# Patient Record
Sex: Female | Born: 1975 | Race: White | Hispanic: No | Smoking: Current every day smoker
Health system: Southern US, Community
[De-identification: ages and names within clinical notes are randomized; demographics above are authoritative.]

## PROBLEM LIST (undated history)

## (undated) DIAGNOSIS — M1990 Unspecified osteoarthritis, unspecified site: Secondary | ICD-10-CM

## (undated) DIAGNOSIS — J189 Pneumonia, unspecified organism: Secondary | ICD-10-CM

## (undated) DIAGNOSIS — R030 Elevated blood-pressure reading, without diagnosis of hypertension: Secondary | ICD-10-CM

## (undated) DIAGNOSIS — E039 Hypothyroidism, unspecified: Secondary | ICD-10-CM

## (undated) DIAGNOSIS — K5190 Ulcerative colitis, unspecified, without complications: Secondary | ICD-10-CM

## (undated) DIAGNOSIS — G43909 Migraine, unspecified, not intractable, without status migrainosus: Secondary | ICD-10-CM

## (undated) DIAGNOSIS — M549 Dorsalgia, unspecified: Secondary | ICD-10-CM

## (undated) DIAGNOSIS — F419 Anxiety disorder, unspecified: Secondary | ICD-10-CM

## (undated) DIAGNOSIS — L732 Hidradenitis suppurativa: Secondary | ICD-10-CM

## (undated) DIAGNOSIS — G8929 Other chronic pain: Secondary | ICD-10-CM

## (undated) DIAGNOSIS — Z87442 Personal history of urinary calculi: Secondary | ICD-10-CM

## (undated) DIAGNOSIS — F32A Depression, unspecified: Secondary | ICD-10-CM

## (undated) DIAGNOSIS — F329 Major depressive disorder, single episode, unspecified: Secondary | ICD-10-CM

## (undated) HISTORY — DX: Migraine, unspecified, not intractable, without status migrainosus: G43.909

## (undated) HISTORY — PX: KNEE SURGERY: SHX244

## (undated) HISTORY — PX: BREAST ENHANCEMENT SURGERY: SHX7

## (undated) HISTORY — DX: Hidradenitis suppurativa: L73.2

## (undated) HISTORY — PX: ANKLE SURGERY: SHX546

## (undated) HISTORY — PX: OVARIAN CYST REMOVAL: SHX89

## (undated) HISTORY — DX: Elevated blood-pressure reading, without diagnosis of hypertension: R03.0

---

## 1998-06-12 ENCOUNTER — Other Ambulatory Visit: Admission: RE | Admit: 1998-06-12 | Discharge: 1998-06-12 | Payer: Self-pay | Admitting: Obstetrics & Gynecology

## 1999-06-19 ENCOUNTER — Other Ambulatory Visit: Admission: RE | Admit: 1999-06-19 | Discharge: 1999-06-19 | Payer: Self-pay | Admitting: Obstetrics & Gynecology

## 2000-06-07 ENCOUNTER — Other Ambulatory Visit: Admission: RE | Admit: 2000-06-07 | Discharge: 2000-06-07 | Payer: Self-pay | Admitting: Obstetrics & Gynecology

## 2001-04-19 ENCOUNTER — Ambulatory Visit (HOSPITAL_COMMUNITY): Admission: RE | Admit: 2001-04-19 | Discharge: 2001-04-19 | Payer: Self-pay | Admitting: Family Medicine

## 2001-04-19 ENCOUNTER — Encounter: Payer: Self-pay | Admitting: Family Medicine

## 2001-06-09 ENCOUNTER — Other Ambulatory Visit: Admission: RE | Admit: 2001-06-09 | Discharge: 2001-06-09 | Payer: Self-pay | Admitting: Obstetrics & Gynecology

## 2001-07-05 ENCOUNTER — Ambulatory Visit (HOSPITAL_COMMUNITY): Admission: RE | Admit: 2001-07-05 | Discharge: 2001-07-05 | Payer: Self-pay | Admitting: Family Medicine

## 2001-07-05 ENCOUNTER — Encounter: Payer: Self-pay | Admitting: Family Medicine

## 2001-07-08 ENCOUNTER — Ambulatory Visit (HOSPITAL_COMMUNITY): Admission: RE | Admit: 2001-07-08 | Discharge: 2001-07-08 | Payer: Self-pay | Admitting: Family Medicine

## 2001-07-08 ENCOUNTER — Encounter: Payer: Self-pay | Admitting: Family Medicine

## 2002-10-03 ENCOUNTER — Encounter: Payer: Self-pay | Admitting: *Deleted

## 2002-10-03 ENCOUNTER — Emergency Department (HOSPITAL_COMMUNITY): Admission: EM | Admit: 2002-10-03 | Discharge: 2002-10-03 | Payer: Self-pay | Admitting: *Deleted

## 2003-03-19 ENCOUNTER — Emergency Department (HOSPITAL_COMMUNITY): Admission: EM | Admit: 2003-03-19 | Discharge: 2003-03-19 | Payer: Self-pay | Admitting: Emergency Medicine

## 2003-09-23 ENCOUNTER — Emergency Department (HOSPITAL_COMMUNITY): Admission: EM | Admit: 2003-09-23 | Discharge: 2003-09-23 | Payer: Self-pay | Admitting: Emergency Medicine

## 2003-09-28 ENCOUNTER — Ambulatory Visit (HOSPITAL_COMMUNITY): Admission: RE | Admit: 2003-09-28 | Discharge: 2003-09-28 | Payer: Self-pay | Admitting: Orthopedic Surgery

## 2004-01-07 ENCOUNTER — Ambulatory Visit (HOSPITAL_COMMUNITY): Admission: RE | Admit: 2004-01-07 | Discharge: 2004-01-07 | Payer: Self-pay | Admitting: *Deleted

## 2004-02-11 ENCOUNTER — Ambulatory Visit (HOSPITAL_COMMUNITY): Admission: RE | Admit: 2004-02-11 | Discharge: 2004-02-11 | Payer: Self-pay | Admitting: *Deleted

## 2004-04-06 ENCOUNTER — Ambulatory Visit (HOSPITAL_COMMUNITY): Admission: AD | Admit: 2004-04-06 | Discharge: 2004-04-06 | Payer: Self-pay | Admitting: *Deleted

## 2004-04-25 ENCOUNTER — Ambulatory Visit (HOSPITAL_COMMUNITY): Admission: RE | Admit: 2004-04-25 | Discharge: 2004-04-25 | Payer: Self-pay | Admitting: *Deleted

## 2004-05-01 ENCOUNTER — Ambulatory Visit (HOSPITAL_COMMUNITY): Admission: RE | Admit: 2004-05-01 | Discharge: 2004-05-01 | Payer: Self-pay | Admitting: *Deleted

## 2004-05-19 ENCOUNTER — Inpatient Hospital Stay (HOSPITAL_COMMUNITY): Admission: AD | Admit: 2004-05-19 | Discharge: 2004-05-22 | Payer: Self-pay | Admitting: *Deleted

## 2005-02-18 ENCOUNTER — Ambulatory Visit (HOSPITAL_COMMUNITY): Admission: RE | Admit: 2005-02-18 | Discharge: 2005-02-18 | Payer: Self-pay | Admitting: Family Medicine

## 2005-03-09 ENCOUNTER — Ambulatory Visit (HOSPITAL_COMMUNITY): Admission: RE | Admit: 2005-03-09 | Discharge: 2005-03-09 | Payer: Self-pay | Admitting: *Deleted

## 2005-08-30 ENCOUNTER — Emergency Department (HOSPITAL_COMMUNITY): Admission: EM | Admit: 2005-08-30 | Discharge: 2005-08-30 | Payer: Self-pay | Admitting: Emergency Medicine

## 2006-11-03 ENCOUNTER — Emergency Department (HOSPITAL_COMMUNITY): Admission: EM | Admit: 2006-11-03 | Discharge: 2006-11-03 | Payer: Self-pay | Admitting: *Deleted

## 2007-01-29 ENCOUNTER — Emergency Department (HOSPITAL_COMMUNITY): Admission: EM | Admit: 2007-01-29 | Discharge: 2007-01-29 | Payer: Self-pay | Admitting: Emergency Medicine

## 2007-02-03 ENCOUNTER — Emergency Department (HOSPITAL_COMMUNITY): Admission: EM | Admit: 2007-02-03 | Discharge: 2007-02-03 | Payer: Self-pay | Admitting: Emergency Medicine

## 2008-06-14 ENCOUNTER — Emergency Department (HOSPITAL_COMMUNITY): Admission: EM | Admit: 2008-06-14 | Discharge: 2008-06-14 | Payer: Self-pay | Admitting: Emergency Medicine

## 2008-06-24 ENCOUNTER — Emergency Department (HOSPITAL_COMMUNITY): Admission: EM | Admit: 2008-06-24 | Discharge: 2008-06-24 | Payer: Self-pay | Admitting: Emergency Medicine

## 2008-06-25 ENCOUNTER — Emergency Department (HOSPITAL_COMMUNITY): Admission: EM | Admit: 2008-06-25 | Discharge: 2008-06-25 | Payer: Self-pay | Admitting: Emergency Medicine

## 2008-07-18 ENCOUNTER — Emergency Department (HOSPITAL_COMMUNITY): Admission: EM | Admit: 2008-07-18 | Discharge: 2008-07-18 | Payer: Self-pay | Admitting: Emergency Medicine

## 2009-03-18 ENCOUNTER — Emergency Department (HOSPITAL_COMMUNITY): Admission: EM | Admit: 2009-03-18 | Discharge: 2009-03-18 | Payer: Self-pay | Admitting: Emergency Medicine

## 2009-06-24 ENCOUNTER — Other Ambulatory Visit: Admission: RE | Admit: 2009-06-24 | Discharge: 2009-06-24 | Payer: Self-pay | Admitting: Obstetrics & Gynecology

## 2009-06-26 ENCOUNTER — Encounter (HOSPITAL_COMMUNITY): Admission: RE | Admit: 2009-06-26 | Discharge: 2009-07-26 | Payer: Self-pay | Admitting: Orthopaedic Surgery

## 2009-07-30 ENCOUNTER — Encounter (HOSPITAL_COMMUNITY): Admission: RE | Admit: 2009-07-30 | Discharge: 2009-08-29 | Payer: Self-pay | Admitting: Orthopaedic Surgery

## 2009-08-14 ENCOUNTER — Ambulatory Visit (HOSPITAL_COMMUNITY): Admission: RE | Admit: 2009-08-14 | Discharge: 2009-08-14 | Payer: Self-pay | Admitting: Orthopaedic Surgery

## 2009-09-09 ENCOUNTER — Encounter (HOSPITAL_COMMUNITY): Admission: RE | Admit: 2009-09-09 | Discharge: 2009-10-09 | Payer: Self-pay | Admitting: Orthopaedic Surgery

## 2009-09-17 ENCOUNTER — Emergency Department (HOSPITAL_COMMUNITY): Admission: EM | Admit: 2009-09-17 | Discharge: 2009-09-17 | Payer: Self-pay | Admitting: Emergency Medicine

## 2009-09-23 ENCOUNTER — Telehealth (INDEPENDENT_AMBULATORY_CARE_PROVIDER_SITE_OTHER): Payer: Self-pay | Admitting: *Deleted

## 2009-09-23 ENCOUNTER — Ambulatory Visit: Payer: Self-pay | Admitting: Gastroenterology

## 2009-09-23 DIAGNOSIS — Z8711 Personal history of peptic ulcer disease: Secondary | ICD-10-CM

## 2009-09-23 DIAGNOSIS — R112 Nausea with vomiting, unspecified: Secondary | ICD-10-CM

## 2009-09-23 DIAGNOSIS — R634 Abnormal weight loss: Secondary | ICD-10-CM | POA: Insufficient documentation

## 2009-09-23 DIAGNOSIS — R197 Diarrhea, unspecified: Secondary | ICD-10-CM | POA: Insufficient documentation

## 2009-09-23 DIAGNOSIS — Z8719 Personal history of other diseases of the digestive system: Secondary | ICD-10-CM | POA: Insufficient documentation

## 2009-09-23 DIAGNOSIS — R1013 Epigastric pain: Secondary | ICD-10-CM | POA: Insufficient documentation

## 2009-09-23 HISTORY — DX: Personal history of peptic ulcer disease: Z87.11

## 2009-09-23 HISTORY — DX: Nausea with vomiting, unspecified: R11.2

## 2009-09-26 ENCOUNTER — Telehealth (INDEPENDENT_AMBULATORY_CARE_PROVIDER_SITE_OTHER): Payer: Self-pay | Admitting: *Deleted

## 2009-09-26 ENCOUNTER — Ambulatory Visit: Payer: Self-pay | Admitting: Gastroenterology

## 2009-09-26 ENCOUNTER — Ambulatory Visit (HOSPITAL_COMMUNITY): Admission: RE | Admit: 2009-09-26 | Discharge: 2009-09-26 | Payer: Self-pay | Admitting: Gastroenterology

## 2009-09-27 ENCOUNTER — Telehealth (INDEPENDENT_AMBULATORY_CARE_PROVIDER_SITE_OTHER): Payer: Self-pay

## 2009-10-09 ENCOUNTER — Encounter: Payer: Self-pay | Admitting: Gastroenterology

## 2009-10-15 ENCOUNTER — Telehealth (INDEPENDENT_AMBULATORY_CARE_PROVIDER_SITE_OTHER): Payer: Self-pay | Admitting: *Deleted

## 2010-07-12 ENCOUNTER — Encounter: Payer: Self-pay | Admitting: Internal Medicine

## 2010-07-12 ENCOUNTER — Encounter: Payer: Self-pay | Admitting: Neurology

## 2010-07-13 ENCOUNTER — Encounter: Payer: Self-pay | Admitting: Preventative Medicine

## 2010-07-13 ENCOUNTER — Encounter: Payer: Self-pay | Admitting: Family Medicine

## 2010-07-13 IMAGING — CR DG CHEST 2V
2 series · 2 of 2 positions shown · non-contrast
Comparison: 11/03/2006

CLINICAL DATA: Left-sided chest pain.  Short of breath.  Dizziness
and tachycardia.

CHEST - 2 VIEW

[w chest pa]
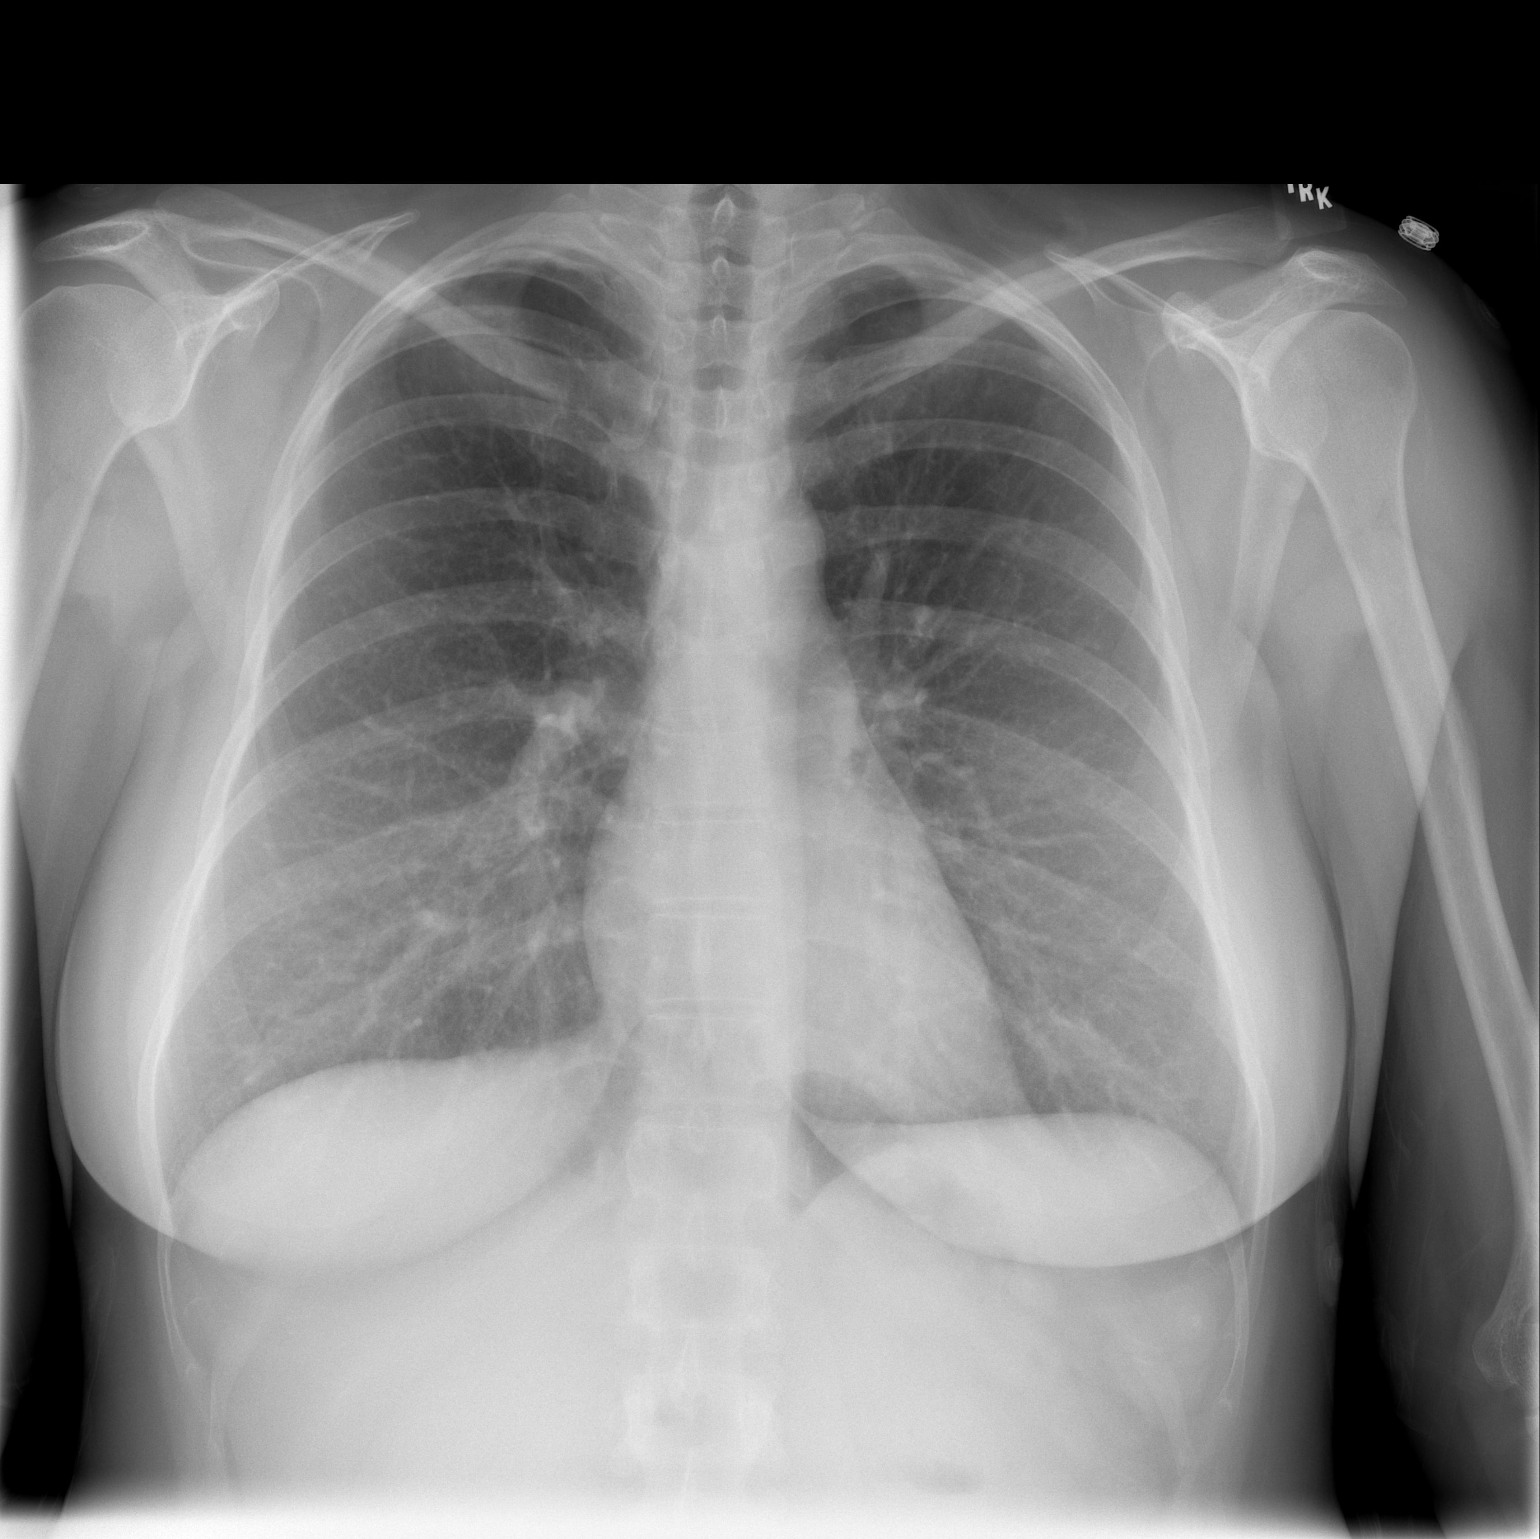

[w chest lat]
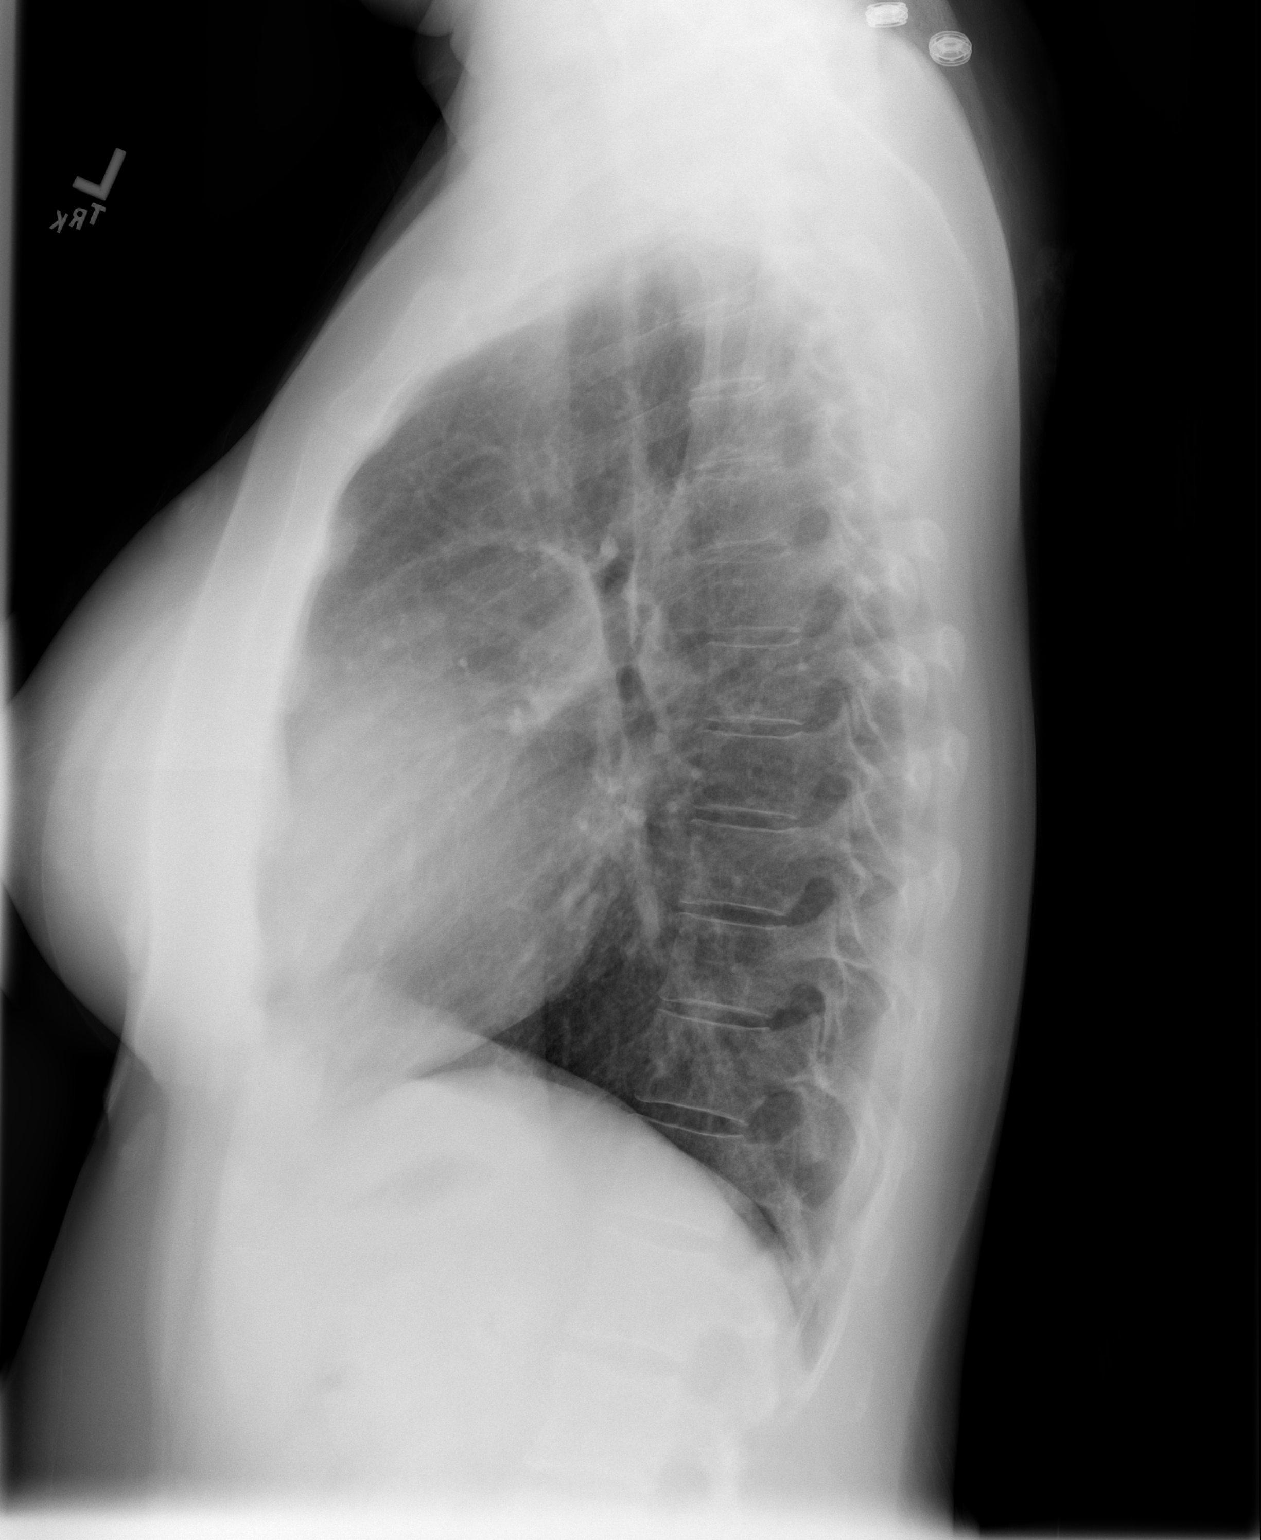

[2 of 2 positions shown; findings below may reference images not displayed]

FINDINGS: Heart size and mediastinal contours are normal.  Both
lungs are clear.  Tiny calcified granuloma is seen in the left
upper lobe.  There is no evidence of pleural effusion.  No mass or
adenopathy identified.
IMPRESSION: No active cardiopulmonary disease.

## 2010-07-22 NOTE — Progress Notes (Signed)
Summary: Headache after procedure  Phone Note Call from Patient   Summary of Call: pt called- she was in tears- complaining of a migraine that started shortly after she got home from her procedure yesterday. She stated it feels like she has been hit in the back of the head. She has tried tylenol, sleeping, staying in a dark room. She said she couldnt go to ED because she doesnt have anyone to watch her daughter. Wants to know if we can call in something that will help. pt uses [facility]. Initial call taken by: Burnadette Peter LPN,  September 28, 4707 2:09 PM     Appended Document: Headache after procedure Please call pt. She should see her PCP or go to the ED for migraine HA management. Her migraine may have been started by fasting but she will need to see her PCP for management.  Appended Document: Headache after procedure Pt informed.

## 2010-07-22 NOTE — Letter (Signed)
Summary: EGD ORDER  EGD ORDER   Imported By: Sofie Rower 09/23/2009 11:20:49  _____________________________________________________________________  External Attachment:    Type:   Image     Comment:   External Document
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
## 2010-07-22 NOTE — Letter (Signed)
Summary: RELEASE OF RECORDS TO PT  RELEASE OF RECORDS TO PT   Imported By: Zeb Comfort 10/09/2009 11:44:14  _____________________________________________________________________  External Attachment:    Type:   Image     Comment:   External Document

## 2010-07-22 NOTE — Progress Notes (Signed)
  Phone Note Call from Patient   Caller: Patient Reason for Call: Refill Medication Summary of Call: Pt is scheduled to see LL on 10/28/09 but would like for [place] to call in a Rx for Phenergan to [facility]- She can be reaced at [phone] Initial call taken by: Sherry Ruffing,  October 15, 2009 11:04 AM     Appended Document: promethazine    Prescriptions: PROMETHAZINE HCL 25 MG TABS (PROMETHAZINE HCL) one by mouth every 4-6 hour as needed n/v  #20 x 0   Entered and Authorized by:   Laureen Ochs. Bernarda Caffey   Signed by:   Laureen Ochs Bernarda Caffey on 10/16/2009   Method used:   Electronically to        [facility] (retail)       [address]       [place], [place]  [address]       Ph: [phone]       Fax: [phone]   RxID:   [id number]     Appended Document:  LM for pt

## 2010-07-22 NOTE — Progress Notes (Signed)
Summary: samples and E30 fu appt  Phone Note From Other Clinic   Summary of Call: Kim from Short Stay called to tell us that [facility] was sending pt over to pick up samples of Lactose Intolerance [employer]age x 4 boxes and to also make pt a fu appt in 3 months w/SF and to put pt in E30 spot. I have samples and appt card at the front window waiting for pt. Initial call taken by: Zeb Comfort,  September 26, 2009 12:25 PM

## 2010-07-22 NOTE — Progress Notes (Signed)
  Phone Note Other Incoming   Request: Send information Summary of Call: Request received from [facility] forwarded to [place].

## 2010-09-14 LAB — URINALYSIS, ROUTINE W REFLEX MICROSCOPIC
Ketones, ur: NEGATIVE mg/dL
Nitrite: NEGATIVE
Protein, ur: NEGATIVE mg/dL
pH: 6.5 (ref 5.0–8.0)

## 2010-09-14 LAB — PREGNANCY, URINE: Preg Test, Ur: NEGATIVE

## 2010-09-14 LAB — COMPREHENSIVE METABOLIC PANEL
Albumin: 4.3 g/dL (ref 3.5–5.2)
BUN: 10 mg/dL (ref 6–23)
Calcium: 9.4 mg/dL (ref 8.4–10.5)
Creatinine, Ser: 0.81 mg/dL (ref 0.4–1.2)
Total Protein: 7.5 g/dL (ref 6.0–8.3)

## 2010-10-06 LAB — CBC
HCT: 39.5 % (ref 36.0–46.0)
HCT: 40.7 % (ref 36.0–46.0)
Hemoglobin: 14 g/dL (ref 12.0–15.0)
MCHC: 34.3 g/dL (ref 30.0–36.0)
MCHC: 34.5 g/dL (ref 30.0–36.0)
MCV: 91.8 fL (ref 78.0–100.0)
MCV: 91.1 fL (ref 78.0–100.0)
MCV: 92.8 fL (ref 78.0–100.0)
Platelets: 346 10*3/uL (ref 150–400)
Platelets: 343 10*3/uL (ref 150–400)
RBC: 4.3 MIL/uL (ref 3.87–5.11)
RBC: 4.41 MIL/uL (ref 3.87–5.11)
RDW: 13.6 % (ref 11.5–15.5)
WBC: 11.1 10*3/uL — ABNORMAL HIGH (ref 4.0–10.5)
WBC: 12.6 10*3/uL — ABNORMAL HIGH (ref 4.0–10.5)

## 2010-10-06 LAB — COMPREHENSIVE METABOLIC PANEL
ALT: 16 U/L (ref 0–35)
AST: 16 U/L (ref 0–37)
AST: 18 U/L (ref 0–37)
Albumin: 4.3 g/dL (ref 3.5–5.2)
Albumin: 4.8 g/dL (ref 3.5–5.2)
BUN: 13 mg/dL (ref 6–23)
BUN: 8 mg/dL (ref 6–23)
CO2: 26 mEq/L (ref 19–32)
CO2: 25 mEq/L (ref 19–32)
Calcium: 9.4 mg/dL (ref 8.4–10.5)
Chloride: 107 mEq/L (ref 96–112)
Creatinine, Ser: 0.69 mg/dL (ref 0.4–1.2)
Creatinine, Ser: 0.64 mg/dL (ref 0.4–1.2)
Creatinine, Ser: 0.8 mg/dL (ref 0.4–1.2)
GFR calc Af Amer: >60 mL/min (ref >60)
GFR calc non Af Amer: >60 mL/min (ref >60)
Glucose, Bld: 104 mg/dL — ABNORMAL HIGH (ref 70–99)
Sodium: 140 mEq/L (ref 135–145)
Total Bilirubin: 0.3 mg/dL (ref 0.3–1.2)
Total Protein: 6.9 g/dL (ref 6.0–8.3)

## 2010-10-06 LAB — DIFFERENTIAL
Basophils Absolute: 0.1 10*3/uL (ref 0.0–0.1)
Basophils Absolute: 0.1 10*3/uL (ref 0.0–0.1)
Basophils Relative: 1 % (ref 0–1)
Basophils Relative: 0 % (ref 0–1)
Eosinophils Absolute: 0.1 10*3/uL (ref 0.0–0.7)
Eosinophils Relative: 3 % (ref 0–5)
Eosinophils Relative: 1 % (ref 0–5)
Lymphocytes Relative: 45 % (ref 12–46)
Lymphocytes Relative: 15 % (ref 12–46)
Lymphocytes Relative: 25 % (ref 12–46)
Lymphs Abs: 3.1 10*3/uL (ref 0.7–4.0)
Monocytes Absolute: 0.5 10*3/uL (ref 0.1–1.0)
Monocytes Absolute: 0.4 10*3/uL (ref 0.1–1.0)
Monocytes Relative: 4 % (ref 3–12)
Monocytes Relative: 3 % (ref 3–12)
Neutro Abs: 5.1 10*3/uL (ref 1.7–7.7)
Neutro Abs: 12.7 10*3/uL — ABNORMAL HIGH (ref 1.7–7.7)
Neutrophils Relative %: 82 % — ABNORMAL HIGH (ref 43–77)

## 2010-10-06 LAB — URINALYSIS, ROUTINE W REFLEX MICROSCOPIC
Bilirubin Urine: NEGATIVE
Glucose, UA: NEGATIVE mg/dL
Hgb urine dipstick: NEGATIVE
Hgb urine dipstick: NEGATIVE
Ketones, ur: NEGATIVE mg/dL
Ketones, ur: NEGATIVE mg/dL
Protein, ur: NEGATIVE mg/dL
Protein, ur: NEGATIVE mg/dL
Urobilinogen, UA: 0.2 mg/dL (ref 0.0–1.0)

## 2010-10-06 LAB — POCT CARDIAC MARKERS
CKMB, poc: <1 ng/mL — ABNORMAL LOW (ref 1.0–8.0)
CKMB, poc: <1 ng/mL — ABNORMAL LOW (ref 1.0–8.0)
Myoglobin, poc: 33.2 ng/mL (ref 12–200)
Myoglobin, poc: 31.3 ng/mL (ref 12–200)
Troponin i, poc: <0.05 ng/mL (ref 0.00–0.09)

## 2010-10-06 LAB — LIPASE, BLOOD: Lipase: 35 U/L (ref 11–59)

## 2010-10-06 LAB — D-DIMER, QUANTITATIVE: D-Dimer, Quant: 0.22 ug/mL-FEU (ref 0.00–0.48)

## 2010-11-07 NOTE — Discharge Summary (Signed)
NAME:  Vanessa Bowen, Vanessa Bowen            ACCOUNT NO.:  [id number]   MEDICAL RECORD NO.:  [id number]          PATIENT TYPE:  OIB   LOCATION:  LDR4                          FACILITY:  [facility]   PHYSICIAN:  Benedetto Goad, MD     DATE OF BIRTH:  1976/06/16   DATE OF ADMISSION:  04/25/2004  DATE OF DISCHARGE:  11/04/2005LH                                 DISCHARGE SUMMARY   HISTORY OF PRESENT ILLNESS:  A 35 year old gravida 3, para 1, AB 1 at 34-4/[redacted]  weeks gestation who is evaluated in labor and delivery for complaints of  uterine contractions.  The patient states she awoke about 0600, initially  felt well, but her first pelvic contraction, cramping-type pain caused her  to double over.  Subsequently, she states they continued to be 5-7 minutes  apart x2 hours duration, for which she presented to labor and delivery.  She  describes good fetal movement.  The patient's prenatal course has been  complicated by one previous evaluation on labor and delivery for uterine  contraction.  She was noted to have irregular uterine contractions at that  time, both easily tocolyzed, utilizing subcutaneous terbutaline.  She was  subsequently discharged to home on both p.o. terbutaline and p.o. magnesium  oxide which she has just continued on April 24, 2004, stating that the  medications were:  1.  Ineffectual.  She states she continued to have  uterine contractions despite their use.  2.  She had anxiety/panic type  symptoms as a result of the subcutaneously terbutaline and the oral  terbutaline.  At the gestational age of 34-3/[redacted] weeks gestation a mutual  decision was made to discontinue these tocolytics with no proven benefit.   PAST MEDICAL HISTORY:  She does have one prior vaginal delivery complicated  by fourth degree extension of the midline episiotomy.  That delivery was  complicated by fetal bradycardia terminally, and forceps delivery.   Past medical history is otherwise noncontributory.   PHYSICAL  EXAMINATION:  VITAL SIGNS:  Temperature 98.3, pulse 79, respiratory  rate 20, blood pressure 108/61.  HEENT:  Negative, no adenopathy.  NECK:  Supple.  Thyroid is nonpalpable.  LUNGS:  Clear.  CARDIOVASCULAR EXAM:  Regular rate and rhythm.  ABDOMEN: Soft and nontender.  No surgical scars are identified.  She is  vertex presentation by Leopold's maneuvers.  EXTREMITIES:  Noted to be normal.  PELVIC EXAM:  Normal external genitalia.  No lesions or ulcerations  identified.  No vaginal bleeding, and no leakage of fluid.   Examination initially by the nursing staff, 0.5 cm dilated, very thick,  presenting part is not engaged.  Cervix is noted to be far posterior.  External fetal monitor initially revealed reassuring fetal heart rate.  Fetal heart rate baseline 140's, 150's.  Contractions q.2-3 minutes.   HOSPITAL COURSE:  Initial presentation as previously described.  The patient  was obviously uncomfortable with the uterine contractions.  She did agree  initially to try p.o. terbutaline.  The p.o. terbutaline was ineffectual in  easing the contraction frequency.  Thus, she did receive a single 0.25 mg of  subcutaneous terbutaline.  The patient was again evaluated 90 minutes post  the subcutaneously terbutaline at which time she was noted to have uterine  contract q.15-20 minutes, of mild intensity.   LABORATORY DATA:  Laboratory studies obtained, none.   CERVICAL EXAMINATION:  The cervical examination by myself reveals the cervix  to be unchanged comparing to an initially examination per the nursing staff.  Thus, the patient is discharged home at this time. She is advised to observe  modified bed rest, and certainly if uterine contraction recur, they can be  timed prior to presentation.  Indication for presentation to labor and  delivery if the uterine contractions q.5 minutes x2 hours duration or  leakage of fluid with fluid running down her leg.  The patient is made aware  that the  oral tocolytics do not improve pregnancy outcome, rather taken only  in an effort to diminish uterine contractility, and she may again try to use  __________ on an outpatient basis.   DISPOSITION:  The patient should have an appointment already scheduled to be  seen in the office in one week's time.  Subsequently, she will be evaluated  on labor and delivery only as clinically indicated.  The patient is  discharged to home from [facility] on April 25, 2004.     Tommi Emery   DC/MEDQ  D:  04/25/2004  T:  04/25/2004  Job:  [id number]

## 2010-11-07 NOTE — Discharge Summary (Signed)
Vanessa Bowen, Vanessa Bowen NO.:  [id number]   MEDICAL RECORD NO.:  [id number]          PATIENT TYPE:  INP   LOCATION:  A413                          FACILITY:  [facility]   PHYSICIAN:  Benedetto Goad, MD     DATE OF BIRTH:  1975-09-09   DATE OF ADMISSION:  05/19/2004  DATE OF DISCHARGE:  12/01/2005LH                                 DISCHARGE SUMMARY   PROCEDURES:  1.  The date of placement of Foley bulb for mechanical ripening of the      cervix, May 19, 2004.  2.  The date of placement of epidural, May 19, 2004.  3.  Date of cesarean section, May 19, 2004.   DISPOSITION:  1.  The patient is given a copy of the standard discharge instructions.  2.  Follow up in the office in three days time for staple removal from      Pfannenstiel incision.   DISCHARGE MEDICATIONS:  1.  Tylox.  2.  The patient is also continued and increased on her dose of Lexapro to 20      mg p.o. daily.   PERTINENT LABORATORY STUDIES:  Admission hemoglobin and hematocrit 12.0 and  34.8 with a white count of 13.8, postoperative day #1, 10.1/29.2 with a  white count of 18.5, O positive blood type.  RPR is nonreactive.  The  patient is bottle-feeding.   HOSPITAL COURSE:  See previous dictation.  Cesarean section done the late  p.m. on May 19, 2004.  Postpartum, the patient did well.  She remained  afebrile.  She had a JP drain within the subcutaneous space.  Foley catheter  is removed on postoperative day #1 at which time the patient was ambulatory.  She had adequate urine output and remained afebrile.  Vital signs remained  stable.  The patient had resumption of all bowel function.  JP drain was  removed on postoperative day #2.  The patient did well on p.o. Tylox for  pain relief.  She is thus discharged to home on postoperative day #2-1/2.     Tommi Emery   DC/MEDQ  D:  05/24/2004  T:  05/25/2004  Job:  [id number]

## 2010-11-07 NOTE — Op Note (Signed)
NAME:  Vanessa Bowen, Vanessa Bowen NO.:  [id number]   MEDICAL RECORD NO.:  [id number]          PATIENT TYPE:  INP   LOCATION:  LDR1                          FACILITY:  [facility]   PHYSICIAN:  Benedetto Goad, MD     DATE OF BIRTH:  Jan 10, 1976   DATE OF PROCEDURE:  05/19/2004  DATE OF DISCHARGE:                                 OPERATIVE REPORT   PROCEDURE:  Placement of Foley bulb for mechanical ripening of the cervix  prior to induction.   According to the protocol, a nonstress test is obtained prior to the  procedure. The patient is on the external fetal monitor.  Fetal heart rate  baseline is 150, accelerations noted greater than 15 beats per minute  times  greater than 15 seconds' duration, normal long-term variability, and no  fetal heart rate decelerations are noted.  She is noted to be contracting  every three to four minutes.   ASSESSMENT:  Reactive nonstress test.   PROCEDURE:  Placement of Foley bulb.   The patient is placed in the dorsal lithotomy position only after I had  examined her cervix.  She is noted to be 0.5 cm dilated, -2 station,  posterior, with the vertex well-applied to the cervix.  In the dorsal  lithotomy position, a sterile speculum examination is used to visualize the  cervix.  A soft 16 French Foley bulb catheter is placed intracervically,  inflated to a volume of 60 mL.  Gentle traction is then applied.  At this  point in time the Foley balloon fell out and was noted to have ruptured.  Thus, the patient is again placed in the dorsal lithotomy position and a  sterile speculum examination is again performed.  On this second attempt, an  opaque 16 French stiff Foley catheter is placed easily intracervically and  inflated to a volume of 40 mL of sterile normal saline.  This is performed  without complications.  Gentle traction is then applied, which confirms a  proper placement in the lower uterine segment.  This is then taped to the  patient's leg  under gentle traction.  The patient is taken out of the dorsal  lithotomy position and continuous electronic fetal monitoring is now in  progress.  There is noted to be no rupture of membranes and no vaginal  bleeding or leakage of fluid, no change in the fetal heart rate baseline or  uterine activity.     Tommi Emery   DC/MEDQ  D:  05/19/2004  T:  05/19/2004  Job:  [id number]

## 2010-11-07 NOTE — Discharge Summary (Signed)
NAME:  Vanessa Bowen, Vanessa Bowen NO.:  [id number]   MEDICAL RECORD NO.:  [id number]          PATIENT TYPE:  OIB   LOCATION:  LDR2                          FACILITY:  [facility]   PHYSICIAN:  Benedetto Goad, MD     DATE OF BIRTH:  [birth year]   DATE OF ADMISSION:  05/01/2004  DATE OF DISCHARGE:  11/10/2005LH                                 DISCHARGE SUMMARY   HISTORY OF PRESENT ILLNESS:  A 35 year old gravida 3, para 1 at 35-3/7  weeks' gestation seen on labor and delivery with the diagnosis of threatened  pre-term labor. The patient awoke at 0400 complaining of severe pelvic pain,  intermittent in nature. The patient had been seen about one week previously  with similar complaints at which time cervix was noted to be fingertip  posterior.   PHYSICAL EXAMINATION:  VITAL SIGNS:  On today's evaluation, she is noted to  be 98.0, 89, 22, and 116/70.  PELVIC:  Uterus soft and nontender. External fetal monitor reassuring fetal  heart rate.   HOSPITAL COURSE:  Initially uterine contractions q. 5 to 10 minutes,  palpably mild during subsequent observation. The patient did receive a  single dose of 0.25 mg of subcutaneous terbutaline. This was in an effort  only to alleviate some of the discomfort associated with the uterine  contractions. However, subsequently the patient did have marked improvement  in her pain and the uterine contractions have not recurred. Cervix on  examination were fingertip, posterior, vertex, at a minus 2 station.   DISCHARGE INSTRUCTIONS:  Signs and symptoms of labor again reviewed with the  patient. Keep next scheduled appointment.     Tommi Emery   DC/MEDQ  D:  05/01/2004  T:  05/01/2004  Job:  [id number]

## 2010-11-07 NOTE — Op Note (Signed)
NAME:  Vanessa Bowen, Vanessa Bowen            ACCOUNT NO.:  [id number]   MEDICAL RECORD NO.:  [id number]          PATIENT TYPE:  INP   LOCATION:  A413                          FACILITY:  [facility]   PHYSICIAN:  Benedetto Goad, MD     DATE OF BIRTH:  1975/07/09   DATE OF PROCEDURE:  DATE OF DISCHARGE:                                 OPERATIVE REPORT   PROCEDURE:  Placement of continuous lumbar analgesia at the L3-4 interspace,  performed by Otho Bellows. Isaiah Blakes, M.D.   COMPLICATIONS:  None.   SUMMARY:  Appropriate informed consent is obtained.  Patient placed in the  seated position, bony landmarks identified.  The L3-4 interspace is  selected.  The patient's back is sterilely prepped and draped utilizing  epidural tray.  Lidocaine 1% plain 5 mL injected at this site to raise a  small skin wheal.  A 17-gauge Tuohy-Schliff needle is then utilized with  loss of resistance to an air-filled glass syringe to properly and  atraumatically identify entry into the epidural space on the first attempt  without difficulty.  An initial test dose of 5 mL 1.5% lidocaine plus  epinephrine injected through the epidural needle and no signs of CSF or  intravascular injection obtained.  The catheter is then inserted to a depth  of 5 cm, epidural needle is removed.  Aspiration test is negative.  A second  test dose of 2 mL of 1.5% lidocaine plus epinephrine injected through the  epidural catheter.  Again no signs of CSF or intravascular injection  obtained.  Thus the catheter is secured into place.  The patient is  connected to the infusion pump with the standard mixture.  She will be  treated with an infusion rate of 14 mL/hr.  Upon return to the lateral  supine position, the patient does have evidence of a bilateral block setting  up, which should provide adequate labor analgesia.  Of note, just prior to  placement of the epidural, the patient did have several variable  decelerations to 80 beats per minute with good  recovery, each of these  lasting less than one minute duration.  During the seated position for  placement of the epidural, the patient had no further fetal heart rate  decelerations.  Immediately after placement of the epidural, the patient is  noted to be 4 cm dilated, 80% effaced, with the vertex at a -1 station but  well-applied to the cervix.   Continued close monitoring of the fetal heart rate revealed overall  reassuring fetal heart rate with accelerations noted; however, the patient  had several intermittent variable decelerations and prolonged decelerations,  the deepest of which was to 80 beats per minute x2 minutes' duration with a  rapid recovery.  This was followed by another.  The patient was assessed  very carefully at that time.  No palpable cord was noted.  The vertex was  well-applied to the cervix.  There was no evidence of uterine excess  activity; thus, the patient was continued to labor until she had another  deceleration in a likewise manner.  Thus, as this point in time  with the  baby now poorly tolerating labor with the variable decelerations, a decision  was made to proceed with a primary low transverse cesarean section.  Micah Flesher. Halm, D.O., was notified.     Tommi Emery  DC/MEDQ  D:  05/19/2004  T:  05/20/2004  Job:  [id number]

## 2010-11-07 NOTE — H&P (Signed)
NAME:  Vanessa Bowen NO.:  [id number]   MEDICAL RECORD NO.:  [id number]           PATIENT TYPE:   LOCATION:                                 FACILITY:   PHYSICIAN:  Benedetto Goad, MD          DATE OF BIRTH:   DATE OF ADMISSION:  05/19/2004  DATE OF DISCHARGE:  LH                                HISTORY & PHYSICAL   The patient admitted for induction of labor at that time.   HISTORY OF PRESENT ILLNESS:  The patient is a 35 year old gravida 3, para 1,  one prior complete spontaneous AB, one prior vaginal delivery, currently at  38+ weeks gestation, who was admitted for induction of labor.  The patient's  history is complicated by a very difficult delivery in 1997 in [place] at  the [facility].  The patient as post dates at [redacted] weeks  gestation. She underwent a very difficult forceps delivery after 2-1/2 hours  of pushing.  She sustained a 4th degree laceration with an extensive repair  required.  The patient was very displeased with the delivery and the fact  that she went beyond 40 weeks and also with the nursing care and overall  hospital in general.  That is why she transferred to [facility] for this  current prenatal course, pregnancy care and delivery.  Unfortunately, the  4th degree perineal laceration has healed without any residual problems,  specifically, no incontinence of flatus or gas.  However, the patient states  that no specific attention was given to it during the hospital stay nor in  the postpartum period.  The patient was not advised of the importance of a  low residue diet, nor was she by her recollection given stool softeners, nor  was she seen prior to a routinely scheduled six weeks postpartum visit.  The  patient would like to avoid difficult delivery during this pregnancy if  possible.  I did offer her primary cesarean section to avoid the possibility  of 4th degree laceration.  It has healed without any residual  effects with  the first repair.  I did explain to her that should a 4th degree laceration  occur with this delivery, there again is no assurance that it will heal  without any residual deficits.  However, the patient is very adamant that  she would like to proceed with a trial of labor and a vaginal delivery.  That pregnancy had been complicated by the patient report that at [redacted] weeks  gestation, she was noted to have irregular uterine contractions and was  noted to be 1 cm dilated.  She was subsequently hospitalized, and continued  on IV magnesium sulfate x4 days duration.  Subsequently, after that, she was  treated with p.o. terbutaline.  This was discontinued at 37 weeks, and she  was noted to progress to [redacted] weeks gestation until induction was performed.   PAST MEDICAL HISTORY:  She does have a history of ulcerative colitis  diagnosed in 63.  She has had no flareups at all since that point in time.  She has  been on Levoxyl 0.05 mg p.o. daily.  This was since 2004.  That was  the starting dose at the onset of pregnancy. It has been serially monitored  during the pregnancy.  No dosage changes have been required.  She has had  right knee surgery x2 of the diagnosis of patellar tibial contusion.  She  did have bilateral knee pain throughout this pregnancy likely due to  degenerative changes.  The patient does have a previous diagnosis of  irritable bowel syndrome with constipation, but this has not been a big  problem during this pregnancy.  She has had serial ultrasounds which have  documented adequate fetal growth and a normal anatomic survey.  The  remainder of the laboratory studies reveal O positive blood type, negative  antibody screen. HIV is negative.  Hepatitis negative.  Rubella immune.  AFB  triple screen within normal limits.  TSH check November 22, 2003, normal at  1.864.   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   She had difficulty maintaining the strenuous  work schedule with the knee pain.  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   The patient has been seen on labor and delivery on three prior occasions  during this pregnancy with the diagnoses of threatened preterm labor.  She  has been noted to occasionally have irregular uterine contractions, but has  made no documented cervical change.  She has been continued on p.o.  terbutaline not in an effort to prolong the pregnancy, but for comfort  measures only, as when she does have the onset of the uterine activity,  taking p.o. terbutaline frequently results in cessation of the uterine  activity and makes her much more comfortable.  She, in addition, has been  taking p.o. Lortab, taking about one per day for therapeutic rest.  She was  noted to be a group B strep carrier status positive in 1997 delivery;  however, GBS status this pregnancy, specifically May 06, 2004, is noted  to be negative.   PAST MEDICAL HISTORY:  Complete spontaneous AB x1.  No D&C was required.  Other vaginal delivery as described.  She states she is allergic to sulfa  which resulted in vomiting.   SOCIAL HISTORY:  Her husband, [place], in employed at [employer].   PHYSICAL EXAMINATION:  GENERAL:  A very-pleasant, curly-headed female, 5  foot, 3 inches, pre pregnancy 142, most recent 160.  Blood pressure 121/70,  pulse rate 80, respiratory rate 20.  HEENT:  Negative.  No adenopathy.  NECK:  Supple.  Thyroid is nonpalpable.  LUNGS:  Clear.  CARDIOVASCULAR:  Exam is regular rate and rhythm.  ABDOMEN: Soft, nontender, gravid uterus is identified.  Vertex presentation  by [facility] maneuver.  Fundal height is 35 cm.  Fetal heart tones auscultated  in the 150's.  EXTREMITIES:  Noted to be normal with only trace  edema. PELVIC EXAM:  Normal external genitalia.  No lesions or ulcerations are  identified.  Pelvis is clinically adequate by clinical pelvimetry.  Cervix  is noted to be 1 cm dilated and posterior, vertex is well applied, but at -2  station.   ASSESSMENT/PLAN:  The patient at 38+ weeks gestation with prior difficult  delivery with 4th degree laceration.  The patient is desirous of  trial of  labor with vaginal delivery.  Additionally, she had received an epidural  with prior labor and delivery, and she would again like a placement of  epidural during this pregnancy.  Due to the somewhat unfavorable nature of  her cervix, plan on admitting the patient in the early a.m.  If no  significant cervical change has been noted, we will place a Foley bulb  catheter which can be removed after several hours, hopefully resulting in  some dilatation of the cervix.  Subsequently, amniotomy can be performed and  induce induction with Pitocin as clinically indicated.  Additionally, the  patient would be desirous again of an epidural during the course of this  labor and delivery.     Tommi Emery   DC/MEDQ  D:  05/14/2004  T:  05/14/2004  Job:  [id number]

## 2010-11-07 NOTE — H&P (Signed)
NAME:  Vanessa Bowen, DIFATTA NO.:  [id number]   MEDICAL RECORD NO.:  [id number]           PATIENT TYPE:  OIB   LOCATION:                                FACILITY:  [facility]   PHYSICIAN:  Benedetto Goad, M.D.   DATE OF BIRTH:  May 07, 1976   DATE OF ADMISSION:  DATE OF DISCHARGE:  LH                                HISTORY & PHYSICAL   OB OBSERVATION NOTE   HISTORY OF PRESENT ILLNESS:  The patient is a 35 year old gravida 2, para 1  at [redacted] weeks gestation who presents to [facility] with the chief  complaint of back pain with radiation around to the front and some pelvic  tightening. The pelvic tightening per patient history lasted only several  seconds duration. She denies any change in her vaginal discharge, any  vaginal itching, any burning, or any leakage of fluid. The patient's  prenatal course has been complicated by findings of bilateral knee pain  which is possibly due to early signs of arthritis. She has had some  persistent steady back pain. She has been taking Lortab's 1/2 p.o. q.h.s.  for therapeutic arrest, as well as for the arthritic type of pain. The  patient has had no prior hospitalizations during this pregnancy. She has  been followed closely in the office as a high risk patient due to a previous  pregnancy. The previous pregnancy was complicated by premature dilatation.  The patient was noted at 32 cm to be 2 cm dilated with uterine contractions  requiring tocolysis with magnesium sulfate. The patient describes good fetal  movement. Denies any change in activity. The discomfort actually started  today after awakening and getting up out of bed. She did time them x1 hour  duration at which time she contacted labor and delivery and was advised to  present.   OBSTETRIC HISTORY:  Pertinent for 1 prior complete spontaneous AB. In 1997  she under vaginal delivery at [facility], 8 pound 10 ounce  infant. The patient was at [redacted] weeks gestation  and the push for 2 and 1/2  hours was delivered by forceps with an epidural and sustained a 4th degree  laceration. The patient was very displeased both with the management of the  pregnancy, the labor and delivery itself as well as postpartum care, stating  that she was not seen back early for evaluation of her healing, nor was she  ever questioned regarding incontinence of flatus or gas. Fortuitously the  laceration repair did result in normal functioning. She is noted to be a  positive GBS carrier status with the 1997 delivery.   PAST MEDICAL HISTORY:  Pertinent also for colitis in 1990. She has had no  long term care for this, nor has she had any flare up's after the initial  episode. The patient is noted to be hypothyroid. She takes Levoxyl 0.05 mg  p.o. daily. This was checked recently during the pregnancy and was noted to  have a normal TSH and no change in dosage required. The patient is noted to  have a history of knee surgery x2 with  findings of patellar tibial  contusions. She states that she continues to have loose knee caps but has  not had any significant knee pain until during this pregnancy. 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 She also is caring for her 37 years old child, thus bedrest is not  an option.   ALLERGIES:  SULFA (causes her to vomit).   CURRENT MEDICATIONS:  Prenatal vitamins, Lortabs q.h.s.   SOCIAL HISTORY:  The patient is a non-smoker. 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   PHYSICAL EXAMINATION:  VITAL SIGNS:  Temperature 98.2, pulse 72, respiratory  rate 20, blood pressure 120/68.  HEENT:  Negative. No adenopathy.  NECK:  Supple. Thyroid non-palpable.  LUNGS:  Clear.  CARDIOVASCULAR:  Regular rate and rhythm.  ABDOMEN:  Soft and nontender. Gravid uterus is  identified with a fundal  height of 32 cm. She is vertex presentation by Leopold's maneuver.  EXTREMITIES:  Noted to be normal.  PELVIC:  Normal external genitalia. No lesions or ulcerations identified. No  leakage of fluid. No vaginal bleeding.   HOSPITAL COURSE:  Initial examination per the nursing staff noted that the  cervix is dilated. External fetal monitor reveals fetal heart rate 150.  Accelerations greater than 15 beats per minute times greater than 15 seconds  duration. No fetal heart rate decelerations are noted. There is uterine  activity noted of 38 to 45 seconds duration during the initial 10 minutes of  the fetal monitoring strip. Three uterine contractions are identified.   LABORATORY DATA:  Urinalysis, few bacteria with 7 to 10 white cells and few  epithelial cells. Hemoglobin and hematocrit 10.9 and 31.1, white count  slightly elevated at 16.8 with a normal differential.   Initial phone report is given regarding the presence of the uterine  contractions. The patient at that point in time is treated with  administration of intravenous fluids as well as Nubain and Phenergan for  sedation. In addition, she did receive a single dose of 0.25 mg of  subcutaneous Terbutaline. On this regimen, patient had complete cessation of  uterine activity as well as uterine irritability x1 hour duration.  Subsequently, she began having some mild complaints of contractions with no  significant change on external fetal monitor. She did receive a single dose  of 2.5 mg of p.o. Terbutaline. Subsequently, she is noted to have no renewal  of uterine activity. Examination by myself revealed the cervix to be closed,  3 cm long, very firm in consistency. No leakage of fluid. No vaginal  bleeding. OB ultrasound greater than [redacted] weeks gestation performed and  interpreted by Dr. Robina Ade. Isaiah Blakes. This reveals a single intra-uterine pregnancy, vertex presentation. Normal amniotic fluid volume  subjectively  and by amniotic fluid index. Good fetal movement is identified. Fatal  cardiac activities are identified. Respiratory movements are identified.  Fundal placenta parameter is obtained. BPD, femur length and abdominal  circumference are all consistent with 31 to [redacted] weeks gestation.   ASSESSMENT:  A 31 to 32 weeks intrauterine pregnancy with threatened pre-  term labor. Additional medical problems of prior history of ulcerative  colitis, irritable bowel syndrome, and hypothyroid condition. The patient is  to be continued on Terbutaline 2.5 mg p.o. q. 6 hours while awake. Advised  to maintain a resting heart rate of  less than 120 or dose can be  discontinued. Signs and symptoms of labor reviewed with the patient,  specifically uterine tightening of 45 seconds to 1 minute duration. More  than 4 in an hour x2 hours duration would require evaluation in labor and  delivery. The patient is also advised that should rupture of membranes occur  or if she noted leakage of fluid with recurrence enough to saturate under  garments and running down her leg.       ___________________________________________  Benedetto Goad, M.D.    DC/MEDQ  D:  04/06/2004  T:  04/06/2004  Job:  [id number]

## 2010-11-07 NOTE — Op Note (Signed)
NAME:  Vanessa Bowen, Vanessa Bowen NO.:  [id number]   MEDICAL RECORD NO.:  [id number]          PATIENT TYPE:  INP   LOCATION:  LDR1                          FACILITY:  [facility]   PHYSICIAN:  Benedetto Goad, MD     DATE OF BIRTH:  1976-03-27   DATE OF PROCEDURE:  05/19/2004  DATE OF DISCHARGE:                                 OPERATIVE REPORT   PROCEDURE:  Removal of Foley bulb.   SUMMARY:  Patient had the Foley bulb catheter placed about four hours  previously.  She has continued to have irregular uterine contractions since  placement but overall reassuring fetal heart rate.  No change in baseline  uterine tone.  With gentle traction, I was able to remove the Foley bulb  catheter without deflating it any.  This was done without complications.  Membranes noted to be intact.  No vaginal bleeding occurred.  Immediately  following this, the cervix was noted to be 4 cm dilated, -1 station, 70%  effaced with the vertex well-applied to the cervix.  Thus, a fetal scalp  electrode is placed with resultant amniotomy and findings of clear amniotic  fluid.  The fetal scalp electrode now documents a reassuring fetal heart  rate.   The patient does plan on receiving epidural with onset of uncomfortable  uterine contractions.     Tommi Emery   DC/MEDQ  D:  05/19/2004  T:  05/19/2004  Job:  [id number]

## 2010-11-07 NOTE — Op Note (Signed)
[name], [name] NO.:  [id number]   MEDICAL RECORD NO.:  [id number]          PATIENT TYPE:  INP   LOCATION:  A413                          FACILITY:  [facility]   PHYSICIAN:  Benedetto Goad, MD     DATE OF BIRTH:  April 15, 1976   DATE OF PROCEDURE:  05/19/2004  DATE OF DISCHARGE:                                 OPERATIVE REPORT   PREOPERATIVE DIAGNOSES:  1.  Thirty-eight plus week intrauterine pregnancy.  2.  Variable decelerations.   PROCEDURES PERFORMED:  1.  Placement of Foley bulb for mechanical ripening of the cervix, May 19, 2004.  2.  Primary low transverse cesarean section, delivery of a 5 pound 9 ounce      female infant.   Findings at time of surgery include a nuchal cord x1.   ESTIMATED BLOOD LOSS:  800 mL.   ANALGESIA:  Epidural which had been placed by Dr. Otho Bellows. Isaiah Blakes during  the course of labor.   DRAINS:  Foley catheter draining the bladder, a JP within the subcutaneous  space.   PEDIATRICIAN:  Micah Flesher. Halm, D.O.   SUMMARY:  Epidural had been placed by Dr. Otho Bellows. Isaiah Blakes and just prior  to patient leaving the labor floor.  She is taken to the OR, placed on the  OR table and placed in left lateral tilt, prepped and draped in the usual  sterile manner.  After assurance of adequate surgical analgesia, a  Pfannenstiel incision is utilized, atraumatically entered the peritoneal  cavity, peritoneal incision extended superiorly and inferiorly.  No adhesive  disease encountered.  Tubes and ovaries noted to be normal in appearance.  Bladder blade was then placed, bladder flap is created from the  vesicouterine fold by dissecting the avascular plane.  A sharp knife is used  to score a low transverse uterine incision.  Amniotic sac is encountered in  the midportion.  My index fingers are then used to bluntly extend the low  transverse uterine incision bilaterally.  The head of the infant is noted to  be in ROT position.  Head of  the infant is flexed and elevated through the  uterine incision.  Gentle traction combined with fundal pressure results in  easy and atraumatic delivery of the infant through the uterine incision  without extension.  Mouth and nares bulb-suctioned of clear amniotic fluid.  The umbilical cord is milked toward the infant.  Cord is doubly clamped and  cut.  Infant is handed to awaiting pediatrician, Dr. Loralyn Freshwater.  Arterial  cord gas and cord blood are then obtained.  Gentle traction on the umbilical  cord results in separation, which upon examination is noted to be intact  placenta with associated three-vessel umbilical cord.  The uterus is then  exteriorized.  Intrauterine exploration reveals no retained placental  fragments.  The uterine incision is not extended.  It is easily closed with  two layers of 0 chromic in a running locked fashion, the second layer being  an imbricating layer.  This results in excellent hemostasis.  A third layer  of 0  chromic is placed to reapproximate the bladder flap.  The cul-de-sac is  then irrigated free of all clots.  The uterus is returned to the pelvic  cavity.  Sponge, needle, and instrument counts are correct x2 at this point.  The peritoneal edges are grasped using Kelly clamps and the peritoneum is  closed with a continuous running 0 Vicryl suture.  The rectus muscle is  reapproximated with continuous running 0 chromic suture.  No subfascial  bleeders are noted.  The fascia is closed with a continuous running PDS  suture.  Subcutaneous bleeders are cauterized.  A JP drain is placed in the  subcutaneous space with a separate exit wound to the left apex of the  incision.  Three horizontal mattress sutures of #1 PDS suture are then  placed as retention-type sutures.  A total of 30 mL 0.5% bupivacaine plain  is then injected along the edge of the skin incision to facilitate  postoperative analgesia.  The skin is then completely closed utilizing skin   staples.  The patient continues to drain clear yellow urine.  She is taken  to the operating room in stable condition.  Operative findings discussed  with the patient's awaiting family.  The patient herself does plan on bottle-  feeding this infant.  Following completion of the procedure, the epidural  catheter is removed per the anesthesia staff with the blue tip noted to be  intact.     Tommi Emery   DC/MEDQ  D:  05/19/2004  T:  05/20/2004  Job:  [id number]

## 2011-03-07 ENCOUNTER — Emergency Department (HOSPITAL_COMMUNITY): Payer: Medicaid Other

## 2011-03-07 ENCOUNTER — Emergency Department (HOSPITAL_COMMUNITY)
Admission: EM | Admit: 2011-03-07 | Discharge: 2011-03-07 | Disposition: A | Discharge status: Home / Self Care | Payer: Medicaid Other | Attending: Emergency Medicine | Admitting: Emergency Medicine

## 2011-03-07 ENCOUNTER — Encounter: Payer: Self-pay | Admitting: *Deleted

## 2011-03-07 DIAGNOSIS — R509 Fever, unspecified: Secondary | ICD-10-CM | POA: Insufficient documentation

## 2011-03-07 DIAGNOSIS — K5190 Ulcerative colitis, unspecified, without complications: Secondary | ICD-10-CM | POA: Insufficient documentation

## 2011-03-07 DIAGNOSIS — R05 Cough: Secondary | ICD-10-CM | POA: Insufficient documentation

## 2011-03-07 DIAGNOSIS — R0602 Shortness of breath: Secondary | ICD-10-CM | POA: Insufficient documentation

## 2011-03-07 DIAGNOSIS — R062 Wheezing: Secondary | ICD-10-CM | POA: Insufficient documentation

## 2011-03-07 DIAGNOSIS — F411 Generalized anxiety disorder: Secondary | ICD-10-CM | POA: Insufficient documentation

## 2011-03-07 DIAGNOSIS — F17200 Nicotine dependence, unspecified, uncomplicated: Secondary | ICD-10-CM | POA: Insufficient documentation

## 2011-03-07 DIAGNOSIS — F329 Major depressive disorder, single episode, unspecified: Secondary | ICD-10-CM | POA: Insufficient documentation

## 2011-03-07 DIAGNOSIS — J3489 Other specified disorders of nose and nasal sinuses: Secondary | ICD-10-CM | POA: Insufficient documentation

## 2011-03-07 DIAGNOSIS — R059 Cough, unspecified: Secondary | ICD-10-CM | POA: Insufficient documentation

## 2011-03-07 DIAGNOSIS — J189 Pneumonia, unspecified organism: Secondary | ICD-10-CM

## 2011-03-07 DIAGNOSIS — F3289 Other specified depressive episodes: Secondary | ICD-10-CM | POA: Insufficient documentation

## 2011-03-07 HISTORY — DX: Ulcerative colitis, unspecified, without complications: K51.90

## 2011-03-07 HISTORY — DX: Anxiety disorder, unspecified: F41.9

## 2011-03-07 HISTORY — DX: Depression, unspecified: F32.A

## 2011-03-07 HISTORY — DX: Major depressive disorder, single episode, unspecified: F32.9

## 2011-03-07 LAB — URINALYSIS, ROUTINE W REFLEX MICROSCOPIC
Hgb urine dipstick: NEGATIVE
Nitrite: NEGATIVE
Protein, ur: NEGATIVE mg/dL
Specific Gravity, Urine: <1.005 — ABNORMAL LOW (ref 1.005–1.030)
Urobilinogen, UA: 0.2 mg/dL (ref 0.0–1.0)

## 2011-03-07 MED ORDER — AZITHROMYCIN 250 MG PO TABS: 250.0000 mg | ORAL_TABLET | Freq: Every day | ORAL | Status: AC

## 2011-03-07 MED ORDER — HYDROCODONE-ACETAMINOPHEN 5-325 MG PO TABS: 1.0000 | ORAL_TABLET | Freq: Four times a day (QID) | ORAL | Status: AC | PRN

## 2011-03-07 MED ORDER — LEVALBUTEROL HCL 0.63 MG/3ML IN NEBU
INHALATION_SOLUTION | RESPIRATORY_TRACT | Status: AC
  Administered 2011-03-07: 0.63 mg via RESPIRATORY_TRACT
  Filled 2011-03-07: qty 3

## 2011-03-07 MED ORDER — LORAZEPAM 1 MG PO TABS
1.0000 mg | ORAL_TABLET | Freq: Once | ORAL | Status: AC
  Administered 2011-03-07: 1 mg via ORAL
  Filled 2011-03-07: qty 1

## 2011-03-07 MED ORDER — LEVALBUTEROL HCL 0.63 MG/3ML IN NEBU
0.6300 mg | INHALATION_SOLUTION | Freq: Once | RESPIRATORY_TRACT | Status: AC
  Administered 2011-03-07: 0.63 mg via RESPIRATORY_TRACT

## 2011-03-07 MED ORDER — ALBUTEROL SULFATE (5 MG/ML) 0.5% IN NEBU
5.0000 mg | INHALATION_SOLUTION | Freq: Once | RESPIRATORY_TRACT | Status: DC
  Filled 2011-03-07: qty 1

## 2011-03-07 MED ORDER — ALBUTEROL SULFATE HFA 108 (90 BASE) MCG/ACT IN AERS: 1.0000 | INHALATION_SPRAY | Freq: Four times a day (QID) | RESPIRATORY_TRACT | Status: DC | PRN

## 2011-03-07 MED ORDER — IPRATROPIUM BROMIDE 0.02 % IN SOLN
0.5000 mg | Freq: Once | RESPIRATORY_TRACT | Status: AC
  Administered 2011-03-07: 0.5 mg via RESPIRATORY_TRACT
  Filled 2011-03-07: qty 2.5

## 2011-03-07 NOTE — ED Notes (Signed)
Pt also c/o urinary frequency and is requesting her urine be tested.

## 2011-03-07 NOTE — ED Notes (Signed)
Pt c/o cough and congestion since yesterday am.

## 2011-03-07 NOTE — ED Notes (Signed)
Pt sitting up in bed texting on her phone call light in reach in no distress

## 2011-03-07 NOTE — ED Provider Notes (Signed)
Scribed for Maudry Diego, MD, the patient was seen in room APA12/APA12. This chart was scribed by [employer]. The patient's care started at 11:35  CSN: [id number] Arrival date & time: 03/07/2011 10:05 AM   Chief Complaint  Patient presents with  . Nasal Congestion      HPI Vanessa Bowen is a 35 y.o. female who presents to the Emergency Department complaining of nasal congestion with associated fever and productive cough, onset 03/06/2011. Reports change in voice. States "it sounds like i have a frog in me". Patient reports mild SOB with activity. Denies any personal or family history of emphysema. Smokes 0.5 PPD. There are no other associated symptoms and no other alleviating or aggravating factors.    HPI ELEMENTS:  Location: Resp  Onset: 03/06/2011 Duration: 1 day  Timing: constant  Context:  as above  Associated symptoms: as above      Past Medical History  Diagnosis Date  . Colitis, ulcerative   . Depression   . Anxiety      Past Surgical History  Procedure Date  . Knee surgery   . Ankle surgery   . Ovarian cyst removal   . Cesarean section     History reviewed. No pertinent family history.  History  Substance Use Topics  . Smoking status: Current Everyday Smoker -- 0.5 packs/day  . Smokeless tobacco: Not on file  . Alcohol Use: Yes     occasionally    OB History    Grav Para Term Preterm Abortions TAB SAB Ect Mult Living                  Review of Systems  Unable to perform ROS Constitutional: Positive for fever. Negative for fatigue.  HENT: Positive for congestion. Negative for sinus pressure and ear discharge.   Eyes: Negative for discharge.  Respiratory: Positive for cough (with sputum) and shortness of breath (mild, with activity).   Cardiovascular: Negative for chest pain.  Gastrointestinal: Negative for abdominal pain and diarrhea.  Genitourinary: Negative for frequency and hematuria.  Musculoskeletal: Negative for back pain.    Skin: Negative for rash.  Neurological: Negative for seizures and headaches.  Hematological: Negative.   Psychiatric/Behavioral: Negative for hallucinations.  All other systems reviewed and are negative.    Allergies  Doxycycline and Sulfonamide derivatives  Home Medications  No current outpatient prescriptions on file.  Physical Exam    BP 116/83  Pulse 86  Temp(Src) 98.8 F (37.1 C) (Oral)  Resp 18  Ht 5' 3"  (1.6 m)  Wt 170 lb 3 oz (77.197 kg)  BMI 30.15 kg/m2  SpO2 100%  LMP 02/23/2011  Physical Exam  Constitutional: She is oriented to person, place, and time. She appears well-developed.  HENT:  Head: Normocephalic and atraumatic.  Mouth/Throat: Oropharynx is clear and moist. No oropharyngeal exudate.  Eyes: Conjunctivae and EOM are normal. Pupils are equal, round, and reactive to light. No scleral icterus.  Neck: Neck supple. No thyromegaly present.  Cardiovascular: Normal rate and regular rhythm.  Exam reveals no gallop and no friction rub.   No murmur heard. Pulmonary/Chest: Effort normal. No stridor. She has wheezes (mild bilaterally ). She has no rales. She exhibits no tenderness.  Abdominal: Soft. Bowel sounds are normal. She exhibits no distension. There is no tenderness. There is no rebound.  Musculoskeletal: Normal range of motion. She exhibits no edema.  Lymphadenopathy:    She has no cervical adenopathy.  Neurological: She is alert and oriented to person, place,  and time. No cranial nerve deficit. Coordination normal.  Skin: Skin is warm and dry. No rash noted. No erythema.  Psychiatric: She has a normal mood and affect. Her behavior is normal.    ED Course  Procedures  OTHER DATA REVIEWED: Nursing notes, vital signs, and past medical records reviewed.    DIAGNOSTIC STUDIES: Oxygen Saturation is 100% on room air, normal by my interpretation.    LABS / RADIOLOGY:  Results for orders placed during the hospital encounter of 03/07/11  URINALYSIS,  ROUTINE W REFLEX MICROSCOPIC      Component Value Range   Color, Urine YELLOW  YELLOW    Appearance CLEAR  CLEAR    Specific Gravity, Urine <1.005 (*) 1.005 - 1.030    pH 6.5  5.0 - 8.0    Glucose, UA NEGATIVE  NEGATIVE (mg/dL)   Hgb urine dipstick NEGATIVE  NEGATIVE    Bilirubin Urine NEGATIVE  NEGATIVE    Ketones, ur NEGATIVE  NEGATIVE (mg/dL)   Protein, ur NEGATIVE  NEGATIVE (mg/dL)   Urobilinogen, UA 0.2  0.0 - 1.0 (mg/dL)   Nitrite NEGATIVE  NEGATIVE    Leukocytes, UA NEGATIVE  NEGATIVE     Dg Chest 2 View  03/07/2011  *RADIOLOGY REPORT*  Clinical Data: Cough, congestion, fever  CHEST - 2 VIEW  Comparison: CT chest dated 03/18/2009  Findings: Mild patchy opacity in the left mid lung, suspicious for lingular pneumonia.  Right lung is essentially clear.  No pleural effusion or pneumothorax.  Cardiomediastinal silhouette is within normal limits.  Degenerative changes of the visualized thoracolumbar spine.  IMPRESSION: Mild patchy opacity in the left mid lung, suspicious for lingular pneumonia.  Original Report Authenticated By: Julian Hy, M.D.    ED COURSE / COORDINATION OF CARE: 11:43 - EDMD examined patient and ordered the following Orders Placed This Encounter  Procedures  . DG Chest 2 View  . Urinalysis with microscopic  13:13 - EDMD recheck on patient. Breath sounds improved. Patient to be discharged with antibiotics, inhaler and cough suppressants.   MDM: bronchitis,  pneumonia  IMPRESSION: Diagnoses that have been ruled out:  Diagnoses that are still under consideration:  Final diagnoses:    PLAN:  Home  The patient is to return the emergency department if there is any worsening of symptoms. I have reviewed the discharge instructions with the patient  CONDITION ON DISCHARGE: Stable  MEDICATIONS GIVEN IN THE E.D.  Medications  DULoxetine (CYMBALTA) 30 MG capsule (not administered)  levothyroxine (SYNTHROID, LEVOTHROID) 75 MCG tablet (not administered)    ALPRAZolam (XANAX) 1 MG tablet (not administered)  ibuprofen (ADVIL,MOTRIN) 200 MG tablet (not administered)  ipratropium (ATROVENT) 0.02 % nebulizer solution 0.5 mg (0.5 mg Nebulization Given 03/07/11 1228)  LORazepam (ATIVAN) tablet 1 mg (1 mg Oral Given 03/07/11 1230)  levalbuterol (XOPENEX) nebulizer solution 0.63 mg (0.63 mg Nebulization Given 03/07/11 1228)    DISCHARGE MEDICATIONS: New Prescriptions   No medications on file    The chart was scribed for me under my direct supervision.  I personally performed the history, physical, and medical decision making and all procedures in the evaluation of this patient.Maudry Diego, MD 03/07/11 1324

## 2011-03-07 NOTE — ED Notes (Signed)
Pt c/o nasal congestion, productive cough and chest pain with coughing or raising her arms since yesterday. Pt alert and oriented x 3. Skin warm and dry. Breath sounds clear and equal bilaterally. Pt also c/o urinary frequency and feeling like she has to go all the time. Denies pain or burning with urination.

## 2011-03-07 NOTE — ED Notes (Signed)
Pt sitting up in bed waiting on provider nothing needed call light within reach

## 2011-03-09 ENCOUNTER — Emergency Department (HOSPITAL_COMMUNITY)
Admission: EM | Admit: 2011-03-09 | Discharge: 2011-03-09 | Disposition: A | Discharge status: Home / Self Care | Payer: Medicaid Other | Attending: Emergency Medicine | Admitting: Emergency Medicine

## 2011-03-09 ENCOUNTER — Encounter (HOSPITAL_COMMUNITY): Payer: Self-pay

## 2011-03-09 DIAGNOSIS — J189 Pneumonia, unspecified organism: Secondary | ICD-10-CM

## 2011-03-09 DIAGNOSIS — R059 Cough, unspecified: Secondary | ICD-10-CM | POA: Insufficient documentation

## 2011-03-09 DIAGNOSIS — R0602 Shortness of breath: Secondary | ICD-10-CM | POA: Insufficient documentation

## 2011-03-09 DIAGNOSIS — R05 Cough: Secondary | ICD-10-CM | POA: Insufficient documentation

## 2011-03-09 MED ORDER — LIDOCAINE HCL (PF) 1 % IJ SOLN
INTRAMUSCULAR | Status: AC
  Administered 2011-03-09: 2.1 mL
  Filled 2011-03-09: qty 5

## 2011-03-09 MED ORDER — CEFTRIAXONE SODIUM 1 G IJ SOLR
1.0000 g | Freq: Once | INTRAMUSCULAR | Status: AC
  Administered 2011-03-09: 1 g via INTRAMUSCULAR
  Filled 2011-03-09: qty 250
  Filled 2011-03-09: qty 1

## 2011-03-09 MED ORDER — ALBUTEROL SULFATE (5 MG/ML) 0.5% IN NEBU
5.0000 mg | INHALATION_SOLUTION | Freq: Once | RESPIRATORY_TRACT | Status: AC
  Administered 2011-03-09: 5 mg via RESPIRATORY_TRACT
  Filled 2011-03-09: qty 1

## 2011-03-09 MED ORDER — HYDROCODONE-ACETAMINOPHEN 5-325 MG PO TABS: 1.0000 | ORAL_TABLET | ORAL | Status: AC | PRN

## 2011-03-09 MED ORDER — IPRATROPIUM BROMIDE 0.02 % IN SOLN
0.5000 mg | Freq: Once | RESPIRATORY_TRACT | Status: AC
  Administered 2011-03-09: 0.5 mg via RESPIRATORY_TRACT
  Filled 2011-03-09: qty 2.5

## 2011-03-09 NOTE — ED Provider Notes (Signed)
Medical screening examination/treatment/procedure(s) were performed by non-physician practitioner and as supervising physician I was immediately available for consultation/collaboration.  Elmer Picker, MD 03/09/11 1546

## 2011-03-09 NOTE — ED Provider Notes (Signed)
History     CSN: [id number] Arrival date & time: 03/09/2011  8:55 AM   Chief Complaint  Patient presents with  . Cough     (Include location/radiation/quality/duration/timing/severity/associated sxs/prior treatment) HPI Comments: Patient presents for a recheck of her pneumonia which she was diagnosed with here 2 days ago.  She continues to have cough which is sometimes productive of yellow sputum.  Coughing continues to get severe when her hydrocodone wears off.  Taking every 6 hours in addition to albuterol inhaler q 4 hours and zithromax.  Scheduled to see her doctor in 2 days.  Patient is a 35 y.o. female presenting with cough. The history is provided by the patient.  Cough This is a new problem. The current episode started more than 2 days ago. The problem occurs constantly. The problem has not changed since onset.The cough is productive of sputum. There has been no fever. Associated symptoms include chills and shortness of breath. Pertinent negatives include no chest pain, no ear pain, no rhinorrhea, no sore throat and no wheezing. Associated symptoms comments: She states coughing makes her sob,  But is comfortable with breathing when not coughing.. The treatment provided no relief. She is a smoker. Her past medical history is significant for pneumonia.     Past Medical History  Diagnosis Date  . Colitis, ulcerative   . Depression   . Anxiety      Past Surgical History  Procedure Date  . Knee surgery   . Ankle surgery   . Ovarian cyst removal   . Cesarean section     No family history on file.  History  Substance Use Topics  . Smoking status: Current Everyday Smoker -- 0.5 packs/day  . Smokeless tobacco: Not on file  . Alcohol Use: Yes     occasionally    OB History    Grav Para Term Preterm Abortions TAB SAB Ect Mult Living                  Review of Systems  Constitutional: Positive for chills and fatigue.  HENT: Negative for ear pain, sore throat and  rhinorrhea.   Respiratory: Positive for cough and shortness of breath. Negative for wheezing and stridor.   Cardiovascular: Negative for chest pain.  Neurological: Positive for weakness. Negative for dizziness.    Allergies  Doxycycline and Sulfonamide derivatives  Home Medications   Current Outpatient Rx  Name Route Sig Dispense Refill  . ALBUTEROL SULFATE HFA 108 (90 BASE) MCG/ACT IN AERS Inhalation Inhale 1-2 puffs into the lungs every 6 (six) hours as needed for wheezing. 1 Inhaler 0  . ALPRAZOLAM 1 MG PO TABS Oral Take 1 mg by mouth 3 (three) times daily.      . AZITHROMYCIN 250 MG PO TABS Oral Take 1 tablet (250 mg total) by mouth daily. Take first 2 tablets together, then 1 every day until finished. 6 tablet 0  . DULOXETINE HCL 30 MG PO CPEP Oral Take 30 mg by mouth daily.      Marland Kitchen HYDROCODONE-ACETAMINOPHEN 5-325 MG PO TABS Oral Take 1 tablet by mouth every 6 (six) hours as needed for pain. 20 tablet 0  . IBUPROFEN 200 MG PO TABS Oral Take 800 mg by mouth every 6 (six) hours as needed. For pain and fever     . LEVOTHYROXINE SODIUM 75 MCG PO TABS Oral Take 75 mcg by mouth daily.      Marland Kitchen PRESCRIPTION MEDICATION Oral Take 1 tablet by mouth daily. Orthomicro-nor  Birth control       Physical Exam    BP 102/62  Pulse 79  Temp(Src) 98.1 F (36.7 C) (Oral)  Resp 19  Ht 5' 3"  (1.6 m)  Wt 170 lb (77.111 kg)  BMI 30.11 kg/m2  SpO2 97%  LMP 02/23/2011  Physical Exam  Nursing note and vitals reviewed. Constitutional: She is oriented to person, place, and time. She appears well-developed and well-nourished.  HENT:  Head: Normocephalic and atraumatic.  Eyes: Conjunctivae are normal.  Neck: Normal range of motion.  Cardiovascular: Normal rate, regular rhythm, normal heart sounds and intact distal pulses.   Pulmonary/Chest: Effort normal. No accessory muscle usage. Not tachypneic. No respiratory distress. She has no wheezes. She has rhonchi in the left middle field. She has no rales.  She exhibits no tenderness.  Abdominal: Soft. Bowel sounds are normal. There is no tenderness.  Musculoskeletal: Normal range of motion.  Neurological: She is alert and oriented to person, place, and time.  Skin: Skin is warm and dry.  Psychiatric: She has a normal mood and affect.    ED Course  Procedures   Dg Chest 2 View  03/07/2011  *RADIOLOGY REPORT*  Clinical Data: Cough, congestion, fever  CHEST - 2 VIEW  Comparison: CT chest dated 03/18/2009  Findings: Mild patchy opacity in the left mid lung, suspicious for lingular pneumonia.  Right lung is essentially clear.  No pleural effusion or pneumothorax.  Cardiomediastinal silhouette is within normal limits.  Degenerative changes of the visualized thoracolumbar spine.  IMPRESSION: Mild patchy opacity in the left mid lung, suspicious for lingular pneumonia.  Original Report Authenticated By: Julian Hy, M.D.     No diagnosis found.   MDM Pneumonia diagnosed 2 days ago,  Patients sx not worse,  But also not improved.  States the hydrocodone only relieves her cough for about 4 hours,  Concerned about running out before able to see pcp in 2 days for a recheck.   Is currently taking zithromax and also using albuterol inhaler q 4 hours.    Rocephin 1 gram IM given.  Patient given albuterol 49m/atrovent 0.5 mg neb while in dept.  Re-exam - no distress.  Will refill hydrocodone so can take more frequently for cough relief.       JFulton Reek PA 03/09/11 1055

## 2011-03-09 NOTE — ED Notes (Signed)
Pt reports was seen here Saturday and was diagnosed with pneumonia.  Was given antibiotic and cough medicine.  Pt says still coughing "hard" and loses her breath.
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
## 2011-07-20 ENCOUNTER — Emergency Department (HOSPITAL_COMMUNITY): Payer: Medicaid Other

## 2011-07-20 ENCOUNTER — Emergency Department (HOSPITAL_COMMUNITY)
Admission: EM | Admit: 2011-07-20 | Discharge: 2011-07-20 | Disposition: A | Discharge status: Home / Self Care | Payer: Medicaid Other | Attending: Emergency Medicine | Admitting: Emergency Medicine

## 2011-07-20 ENCOUNTER — Encounter (HOSPITAL_COMMUNITY): Payer: Self-pay

## 2011-07-20 DIAGNOSIS — F3289 Other specified depressive episodes: Secondary | ICD-10-CM | POA: Insufficient documentation

## 2011-07-20 DIAGNOSIS — F17200 Nicotine dependence, unspecified, uncomplicated: Secondary | ICD-10-CM | POA: Insufficient documentation

## 2011-07-20 DIAGNOSIS — F329 Major depressive disorder, single episode, unspecified: Secondary | ICD-10-CM | POA: Insufficient documentation

## 2011-07-20 DIAGNOSIS — J40 Bronchitis, not specified as acute or chronic: Secondary | ICD-10-CM | POA: Insufficient documentation

## 2011-07-20 DIAGNOSIS — F411 Generalized anxiety disorder: Secondary | ICD-10-CM | POA: Insufficient documentation

## 2011-07-20 MED ORDER — HYDROCODONE-ACETAMINOPHEN 5-325 MG PO TABS: 1.0000 | ORAL_TABLET | ORAL | Status: AC | PRN

## 2011-07-20 MED ORDER — AMOXICILLIN 500 MG PO CAPS: 1000.0000 mg | ORAL_CAPSULE | Freq: Two times a day (BID) | ORAL | Status: AC

## 2011-07-20 MED ORDER — AMOXICILLIN 250 MG PO CAPS
1000.0000 mg | ORAL_CAPSULE | Freq: Once | ORAL | Status: AC
  Administered 2011-07-20: 1000 mg via ORAL
  Filled 2011-07-20: qty 4

## 2011-07-20 MED ORDER — ALBUTEROL SULFATE (5 MG/ML) 0.5% IN NEBU
2.5000 mg | INHALATION_SOLUTION | Freq: Once | RESPIRATORY_TRACT | Status: AC
  Administered 2011-07-20: 2.5 mg via RESPIRATORY_TRACT
  Filled 2011-07-20: qty 0.5

## 2011-07-20 MED ORDER — IPRATROPIUM BROMIDE 0.02 % IN SOLN
0.5000 mg | Freq: Once | RESPIRATORY_TRACT | Status: AC
  Administered 2011-07-20: 0.5 mg via RESPIRATORY_TRACT
  Filled 2011-07-20: qty 2.5

## 2011-07-20 MED ORDER — PREDNISONE 50 MG PO TABS: 50.0000 mg | ORAL_TABLET | Freq: Every day | ORAL | Status: DC

## 2011-07-20 MED ORDER — PREDNISONE 20 MG PO TABS
60.0000 mg | ORAL_TABLET | Freq: Once | ORAL | Status: AC
  Administered 2011-07-20: 60 mg via ORAL
  Filled 2011-07-20: qty 3

## 2011-07-20 NOTE — ED Provider Notes (Signed)
History     CSN: [id number]  Arrival date & time 07/20/11  0746   First MD Initiated Contact with Patient 07/20/11 [phone]      Chief Complaint  Patient presents with  . URI    (Consider location/radiation/quality/duration/timing/severity/associated sxs/prior treatment) Patient is a 36 y.o. female presenting with URI. The history is provided by the patient.  URI  For the last 4 days, she has had fever up to 101.7, chills and sweats. She has had rhinorrhea and a sore throat and a cough productive of yellowish sputum. There's been associated shortness of breath, nausea, and diarrhea. She has also had a sore throat. She has had generalized myalgias. She has not had any vomiting. She did see her physician who advised her to use albuterol inhaler and over-the-counter cough and cold medication. The inhaler gives very temporary relief of cough and dyspnea, and the over-the-counter medications have not helped at all. She did not get a flu shot this year. She cannot take Tamiflu because of GI side effects. Symptoms are described as moderate to severe.  Past Medical History  Diagnosis Date  . Colitis, ulcerative   . Depression   . Anxiety     Past Surgical History  Procedure Date  . Knee surgery   . Ankle surgery   . Ovarian cyst removal   . Cesarean section     No family history on file.  History  Substance Use Topics  . Smoking status: Current Everyday Smoker -- 0.5 packs/day  . Smokeless tobacco: Not on file  . Alcohol Use: Yes     occasionally    OB History    Grav Para Term Preterm Abortions TAB SAB Ect Mult Living                  Review of Systems  All other systems reviewed and are negative.    Allergies  Doxycycline and Sulfonamide derivatives  Home Medications   Current Outpatient Rx  Name Route Sig Dispense Refill  . ALBUTEROL SULFATE HFA 108 (90 BASE) MCG/ACT IN AERS Inhalation Inhale 1-2 puffs into the lungs every 6 (six) hours as needed for wheezing. 1  Inhaler 0  . ALPRAZOLAM 1 MG PO TABS Oral Take 1 mg by mouth 3 (three) times daily.      . DULOXETINE HCL 30 MG PO CPEP Oral Take 30 mg by mouth daily.      . IBUPROFEN 200 MG PO TABS Oral Take 800 mg by mouth every 6 (six) hours as needed. For pain and fever     . LEVOTHYROXINE SODIUM 75 MCG PO TABS Oral Take 75 mcg by mouth daily.      Marland Kitchen PRESCRIPTION MEDICATION Oral Take 1 tablet by mouth daily. Orthomicro-nor  Birth control       BP 144/69  Pulse 104  Temp(Src) 98.7 F (37.1 C) (Oral)  Resp 20  Ht 5' 3"  (1.6 m)  Wt 180 lb (81.647 kg)  BMI 31.89 kg/m2  SpO2 99%  LMP 07/10/2011  Physical Exam  Nursing note and vitals reviewed.  36 year old female who is resting comfortably and in no acute distress. Vital signs are significant for mild tachycardia with pulse rate 104. Oxygen saturation is 99% which is normal. Head is normocephalic and atraumatic. PERRLA, EOMI. Oropharynx shows mild erythema without exudate. Neck is supple without adenopathy or JVD. Back is nontender. Lungs have prolonged exhalation phase with Edward wheezes. No rales are heard. Heart has regular rate and rhythm without murmur. Is  no chest wall tenderness. Abdomen is soft, flat, nontender without masses or hepatosplenomegaly and peristalsis is present. Extremities have no cyanosis or edema, full range of motion is present. Skin is warm and moist without rash. Neurologic: Mental status is normal, cranial nerves are intact, there no focal motor or sensory deficits.  ED Course  Procedures (including critical care time) Results for orders placed during the hospital encounter of 03/07/11  URINALYSIS, ROUTINE W REFLEX MICROSCOPIC      Component Value Range   Color, Urine YELLOW  YELLOW    APPearance CLEAR  CLEAR    Specific Gravity, Urine <1.005 (*) 1.005 - 1.030    pH 6.5  5.0 - 8.0    Glucose, UA NEGATIVE  NEGATIVE (mg/dL)   Hgb urine dipstick NEGATIVE  NEGATIVE    Bilirubin Urine NEGATIVE  NEGATIVE    Ketones, ur  NEGATIVE  NEGATIVE (mg/dL)   Protein, ur NEGATIVE  NEGATIVE (mg/dL)   Urobilinogen, UA 0.2  0.0 - 1.0 (mg/dL)   Nitrite NEGATIVE  NEGATIVE    Leukocytes, UA NEGATIVE  NEGATIVE    Dg Chest 2 View  07/20/2011  *RADIOLOGY REPORT*  Clinical Data: Cough, fever.  CHEST - 2 VIEW  Comparison: 03/07/2011  Findings: Heart and mediastinal contours are within normal limits. No focal opacities or effusions.  No acute bony abnormality.  IMPRESSION: No active cardiopulmonary disease.  Original Report Authenticated By: Raelyn Number, M.D.      1. Bronchitis     [phone]: She relates relief with albuterol and Atrovent nebulizer treatment. She's also been given and initial dose of prednisone. On exam, there does seem to be improved airflow. She will be given a repeat albuterol nebulizer treatment.  0955: She is somewhat better after second albuterol nebulizer treatment. She still complaining of coughing. Given her fever and relatively poor response to albuterol, and she will be treated for possible bacterial bronchitis. She'll be sent home with a prescription for amoxicillin 1000 mg twice a day. She'll also begin a prescription for hydrocodone-acetaminophen for pain and cough. She'll also be given a 5 day prescription for prednisone 50 mg. MDM  Probable influenza with possible superimposed pneumonia. Old ED records are reviewed, and she has several ED visits for community-acquired pneumonia.        Delora Fuel, MD 71/21/97 [id number]

## 2011-07-20 NOTE — ED Notes (Signed)
Pt saw PCP Friday and was diagnosed with flu.  Pt says since then has still been coughing, reports SOB, nausea, and diarrhea.  Says can't tamiflu because of n/v.  Has been taking mucinex, albuterol inhaler, and ibuprofen.  Reports cough is productive at times.

## 2012-04-12 ENCOUNTER — Other Ambulatory Visit: Payer: Self-pay | Admitting: Obstetrics & Gynecology

## 2012-04-12 ENCOUNTER — Other Ambulatory Visit (HOSPITAL_COMMUNITY)
Admission: RE | Admit: 2012-04-12 | Discharge: 2012-04-12 | Disposition: A | Discharge status: Home / Self Care | Payer: Medicaid Other | Source: Ambulatory Visit | Attending: Obstetrics & Gynecology | Admitting: Obstetrics & Gynecology

## 2012-04-12 DIAGNOSIS — Z01419 Encounter for gynecological examination (general) (routine) without abnormal findings: Secondary | ICD-10-CM | POA: Insufficient documentation

## 2012-07-13 ENCOUNTER — Encounter (HOSPITAL_COMMUNITY): Payer: Self-pay | Admitting: Pharmacy Technician

## 2012-07-21 NOTE — Patient Instructions (Signed)
Vanessa Bowen  07/21/2012   Your procedure is scheduled on:  07/27/12  Report to [facility] at [place] [facility].  Call this number if you have problems the morning of surgery: [phone]   Remember:   Do not eat food or drink liquids after midnight.   Take these medicines the morning of surgery with A SIP OF WATER: xanax, cymbalta, synthroid   Do not wear jewelry, make-up or nail polish.  Do not wear lotions, powders, or perfumes. You may wear deodorant.  Do not shave 48 hours prior to surgery. Men may shave face and neck.  Do not bring valuables to the hospital.  Contacts, dentures or bridgework may not be worn into surgery.  Leave suitcase in the car. After surgery it may be brought to your room.  For patients admitted to the hospital, checkout time is 11:00 AM the day of  discharge.   Patients discharged the day of surgery will not be allowed to drive  home.  Name and phone number of your driver: family  Special Instructions: Shower using CHG 2 nights before surgery and the night before surgery.  If you shower the day of surgery use CHG.  Use special wash - you have one bottle of CHG for all showers.  You should use approximately 1/3 of the bottle for each shower.   Please read over the following fact sheets that you were given: Pain Booklet, MRSA Information, Surgical Site Infection Prevention, Anesthesia Post-op Instructions and Care and Recovery After Surgery   PATIENT INSTRUCTIONS POST-ANESTHESIA  IMMEDIATELY FOLLOWING SURGERY:  Do not drive or operate machinery for the first twenty four hours after surgery.  Do not make any important decisions for twenty four hours after surgery or while taking narcotic pain medications or sedatives.  If you develop intractable nausea and vomiting or a severe headache please notify your doctor immediately.  FOLLOW-UP:  Please make an appointment with your surgeon as instructed. You do not need to follow up with anesthesia unless specifically instructed  to do so.  WOUND CARE INSTRUCTIONS (if applicable):  Keep a dry clean dressing on the anesthesia/puncture wound site if there is drainage.  Once the wound has quit draining you may leave it open to air.  Generally you should leave the bandage intact for twenty four hours unless there is drainage.  If the epidural site drains for more than 36-48 hours please call the anesthesia department.  QUESTIONS?:  Please feel free to call your physician or the hospital operator if you have any questions, and they will be happy to assist you.      Bilateral Salpingo-Oophorectomy Removal of both fallopian tubes and ovaries is called a Bilateral Salpingo-oophorectomy (BSO). The fallopian tubes transport the egg from the ovary to the womb (uterus). The fallopian tube is also where the sperm and egg meet and become fertilized and move down into the uterus. Usually when a BSO is done, the uterus was previously removed. Removing both tubes and ovaries will:  Put you into the menopause. You will no longer have menstrual periods.  May cause you to have symptoms of menopause (hot flashes, night sweats, mood changes).  Not affect your sex drive or physical relationship.  Cause you to not be able to become pregnant (sterile). LET YOUR CAREGIVER KNOW ABOUT:  Allergies to food or medication.  Medications taken including herbs, eye drops, over-the-counter medications, and creams.  Use of steroids (by mouth or creams).  Previous problems with anesthetics or numbing medication.  Possibility of pregnancy, if this applies.  Your smoking habits  History of blood clots (thrombophlebitis).  History of bleeding or blood problems.  Previous surgeries.  Other health problems. RISKS AND COMPLICATIONS All surgery is associated with risks. Some of these risks are:  Injury to surrounding organs.  Bleeding.  Infection.  Blood clots in the legs or lungs.  Problems with the anesthesia.  The surgery does not  help the problem.  Death. BEFORE THE PROCEDURE  Do not take aspirin or blood thinners because it can make you bleed.  Do not eat or drink anything at least 8 hours before the surgery.  Let your caregiver know if you develop a cold or an infection.  If you are being admitted the day of surgery, arrive at least 1 hour before the surgery to read and sign the necessary forms and consents.  Arrange for help when you go home from the hospital.  If you smoke, do not smoke for at least 2 weeks before the surgery. PROCEDURE  You will change into a hospital gown. Then, you will be given an IV (intravenous) and a medication to relax you. You will be put to sleep with an anesthetic. Any hair on your lower belly (abdomen) will be removed, and a catheter will be placed in your bladder. The fallopian tubes and ovaries will be removed either through 2 very small cuts (incisions) or through large incision in the lower abdomen. AFTER THE PROCEDURE  You will be taken to the recovery room for 1 to 3 hours until your blood pressure, pulse, and temperature are stable and you are waking up.  If you had a laparoscopy, you may be discharged in several hours.  If you had a laparoscopy, you may have shoulder pain for a day or two from air left in the abdomen. The air can irritate the nerve that goes from the diaphragm to the shoulder.  You will be given pain medication as is necessary.  The intravenous and catheter will be removed.  Have someone available to take you home from the hospital. [place]   Only take over-the-counter or prescription medicines for pain, discomfort, or fever as directed by your caregiver.  Do not take aspirin. It can cause bleeding.  Do not drive when taking pain medication.  Follow your caregiver's advice regarding diet, exercise, lifting, driving, and general activities.  You may resume your usual diet as directed and allowed.  Get plenty of rest and  sleep.  Do not douche, use tampons, or have sexual intercourse until your caregiver says it is okay.  Change your bandages (dressings) as directed.  Take your temperature twice a day and write it down.  Your caregiver may recommend showers instead of baths for a few weeks.  Do not drink alcohol until your caregiver says it is okay.  If you develop constipation, you may take a mild laxative with your caregiver's permission. Bran foods and drinking fluids helps with constipation problems.  Try to have someone home with you for a week or two to help with the household activities.  Make sure you and your family understands everything about your operation and recovery.  Do not sign any legal documents until you feel normal again.  Keep all your follow-up appointments. SEEK MEDICAL CARE IF:   There is swelling, redness, or increasing pain in the wound area.  Pus is coming from the wound.  You notice a bad smell from the wound or surgical dressing.  You  have pain, redness, or swelling from the intravenous site.  The wound is breaking open (the edges are not staying together).  You feel dizzy or feel like fainting.  You develop pain or bleeding when you urinate.  You develop diarrhea.  You develop nausea and vomiting.  You develop abnormal vaginal discharge.  You develop a rash.  You have any type of abnormal reaction or develop an allergy to your medication.  You need stronger pain medication for your pain. SEEK IMMEDIATE MEDICAL CARE IF:   You develop an unexplained temperature above 100 F (37.8 C).  You develop abdominal pain.  You develop chest pain.  You develop shortness of breath.  You pass out.  You develop pain, swelling, or redness of your leg.  You develop heavy vaginal bleeding with or without blood clots. Document Released: 06/08/2005 Document Revised: 08/31/2011 Document Reviewed: 11/02/2008 [facility] Patient Information 2013 [place].

## 2012-07-22 ENCOUNTER — Encounter (HOSPITAL_COMMUNITY)
Admission: RE | Admit: 2012-07-22 | Discharge: 2012-07-22 | Disposition: A | Discharge status: Home / Self Care | Payer: Medicaid Other | Source: Ambulatory Visit | Attending: Obstetrics & Gynecology | Admitting: Obstetrics & Gynecology

## 2012-07-22 ENCOUNTER — Encounter (HOSPITAL_COMMUNITY): Payer: Self-pay

## 2012-07-22 ENCOUNTER — Other Ambulatory Visit: Payer: Self-pay | Admitting: Obstetrics & Gynecology

## 2012-07-22 HISTORY — DX: Other chronic pain: G89.29

## 2012-07-22 HISTORY — DX: Unspecified osteoarthritis, unspecified site: M19.90

## 2012-07-22 HISTORY — DX: Dorsalgia, unspecified: M54.9

## 2012-07-22 HISTORY — DX: Hypothyroidism, unspecified: E03.9

## 2012-07-22 LAB — URINALYSIS, ROUTINE W REFLEX MICROSCOPIC
Glucose, UA: NEGATIVE mg/dL
Hgb urine dipstick: NEGATIVE
Nitrite: NEGATIVE
Specific Gravity, Urine: 1.025 (ref 1.005–1.030)
pH: 6 (ref 5.0–8.0)

## 2012-07-22 LAB — CBC
MCHC: 35.2 g/dL (ref 30.0–36.0)
RDW: 12.5 % (ref 11.5–15.5)
WBC: 8.4 10*3/uL (ref 4.0–10.5)

## 2012-07-22 LAB — URINE MICROSCOPIC-ADD ON

## 2012-07-22 LAB — COMPREHENSIVE METABOLIC PANEL
ALT: 11 U/L (ref 0–35)
AST: 11 U/L (ref 0–37)
Albumin: 4.6 g/dL (ref 3.5–5.2)
Alkaline Phosphatase: 69 U/L (ref 39–117)
Chloride: 101 mEq/L (ref 96–112)
Potassium: 3.8 mEq/L (ref 3.5–5.1)
Sodium: 137 mEq/L (ref 135–145)
Total Bilirubin: 0.4 mg/dL (ref 0.3–1.2)
Total Protein: 7.5 g/dL (ref 6.0–8.3)

## 2012-07-22 LAB — SURGICAL PCR SCREEN
MRSA, PCR: NEGATIVE
Staphylococcus aureus: NEGATIVE

## 2012-07-22 LAB — HCG, QUANTITATIVE, PREGNANCY: hCG, Beta Chain, Quant, S: 1 m[IU]/mL (ref <5)

## 2012-07-22 MED ORDER — KETOROLAC TROMETHAMINE 30 MG/ML IJ SOLN: 30.0000 mg | Freq: Once | INTRAMUSCULAR | Status: DC

## 2012-07-27 ENCOUNTER — Encounter (HOSPITAL_COMMUNITY): Payer: Self-pay | Admitting: *Deleted

## 2012-07-27 ENCOUNTER — Ambulatory Visit (HOSPITAL_COMMUNITY): Payer: Medicaid Other | Admitting: Anesthesiology

## 2012-07-27 ENCOUNTER — Encounter (HOSPITAL_COMMUNITY)
Admission: RE | Disposition: A | Discharge status: Home / Self Care | Payer: Self-pay | Source: Ambulatory Visit | Attending: Obstetrics & Gynecology

## 2012-07-27 ENCOUNTER — Encounter (HOSPITAL_COMMUNITY): Payer: Self-pay | Admitting: Anesthesiology

## 2012-07-27 ENCOUNTER — Ambulatory Visit (HOSPITAL_COMMUNITY)
Admission: RE | Admit: 2012-07-27 | Discharge: 2012-07-27 | Disposition: A | Discharge status: Home / Self Care | Payer: Medicaid Other | Source: Ambulatory Visit | Attending: Obstetrics & Gynecology | Admitting: Obstetrics & Gynecology

## 2012-07-27 DIAGNOSIS — Z302 Encounter for sterilization: Secondary | ICD-10-CM | POA: Insufficient documentation

## 2012-07-27 DIAGNOSIS — Z9889 Other specified postprocedural states: Secondary | ICD-10-CM

## 2012-07-27 DIAGNOSIS — Z01812 Encounter for preprocedural laboratory examination: Secondary | ICD-10-CM | POA: Insufficient documentation

## 2012-07-27 HISTORY — PX: LAPAROSCOPIC BILATERAL SALPINGECTOMY: SHX5889

## 2012-07-27 SURGERY — SALPINGECTOMY, BILATERAL, LAPAROSCOPIC
Anesthesia: General | Wound class: Clean

## 2012-07-27 MED ORDER — FENTANYL CITRATE 0.05 MG/ML IJ SOLN
INTRAMUSCULAR | Status: AC
  Filled 2012-07-27: qty 5

## 2012-07-27 MED ORDER — ROCURONIUM BROMIDE 100 MG/10ML IV SOLN
INTRAVENOUS | Status: DC | PRN
  Administered 2012-07-27: 35 mg via INTRAVENOUS

## 2012-07-27 MED ORDER — BUPIVACAINE LIPOSOME 1.3 % IJ SUSP
INTRAMUSCULAR | Status: DC | PRN
  Administered 2012-07-27: 20 mL

## 2012-07-27 MED ORDER — CEFAZOLIN SODIUM-DEXTROSE 2-3 GM-% IV SOLR
INTRAVENOUS | Status: DC | PRN
  Administered 2012-07-27: 2 g via INTRAVENOUS

## 2012-07-27 MED ORDER — BUPIVACAINE LIPOSOME 1.3 % IJ SUSP
20.0000 mL | Freq: Once | INTRAMUSCULAR | Status: DC
  Filled 2012-07-27: qty 20

## 2012-07-27 MED ORDER — LIDOCAINE HCL (PF) 1 % IJ SOLN
INTRAMUSCULAR | Status: AC
  Filled 2012-07-27: qty 5

## 2012-07-27 MED ORDER — ONDANSETRON HCL 4 MG/2ML IJ SOLN
4.0000 mg | Freq: Once | INTRAMUSCULAR | Status: AC
  Administered 2012-07-27: 4 mg via INTRAVENOUS

## 2012-07-27 MED ORDER — OXYCODONE-ACETAMINOPHEN 7.5-325 MG PO TABS: 1.0000 | ORAL_TABLET | Freq: Four times a day (QID) | ORAL | Status: DC | PRN

## 2012-07-27 MED ORDER — SODIUM CHLORIDE 0.9 % IR SOLN
Status: DC | PRN
  Administered 2012-07-27: 1000 mL

## 2012-07-27 MED ORDER — FENTANYL CITRATE 0.05 MG/ML IJ SOLN
50.0000 ug | Freq: Once | INTRAMUSCULAR | Status: AC
  Administered 2012-07-27: 50 ug via INTRAVENOUS

## 2012-07-27 MED ORDER — GLYCOPYRROLATE 0.2 MG/ML IJ SOLN
INTRAMUSCULAR | Status: DC | PRN
  Administered 2012-07-27: 0.4 mg via INTRAVENOUS

## 2012-07-27 MED ORDER — PROPOFOL 10 MG/ML IV EMUL
INTRAVENOUS | Status: AC
  Filled 2012-07-27: qty 20

## 2012-07-27 MED ORDER — CEFAZOLIN SODIUM-DEXTROSE 2-3 GM-% IV SOLR: 2.0000 g | INTRAVENOUS | Status: DC

## 2012-07-27 MED ORDER — NEOSTIGMINE METHYLSULFATE 1 MG/ML IJ SOLN
INTRAMUSCULAR | Status: DC | PRN
  Administered 2012-07-27: 3 mg via INTRAVENOUS

## 2012-07-27 MED ORDER — MIDAZOLAM HCL 2 MG/2ML IJ SOLN
INTRAMUSCULAR | Status: AC
  Filled 2012-07-27: qty 2

## 2012-07-27 MED ORDER — ONDANSETRON HCL 8 MG PO TABS: 8.0000 mg | ORAL_TABLET | Freq: Three times a day (TID) | ORAL | Status: DC | PRN

## 2012-07-27 MED ORDER — LIDOCAINE HCL 1 % IJ SOLN
INTRAMUSCULAR | Status: DC | PRN
  Administered 2012-07-27: 50 mg via INTRADERMAL

## 2012-07-27 MED ORDER — FENTANYL CITRATE 0.05 MG/ML IJ SOLN
INTRAMUSCULAR | Status: DC | PRN
  Administered 2012-07-27: 100 ug via INTRAVENOUS
  Administered 2012-07-27: 50 ug via INTRAVENOUS
  Administered 2012-07-27: 100 ug via INTRAVENOUS

## 2012-07-27 MED ORDER — FENTANYL CITRATE 0.05 MG/ML IJ SOLN
INTRAMUSCULAR | Status: AC
  Filled 2012-07-27: qty 2

## 2012-07-27 MED ORDER — MIDAZOLAM HCL 5 MG/5ML IJ SOLN
INTRAMUSCULAR | Status: DC | PRN
  Administered 2012-07-27: 2 mg via INTRAVENOUS

## 2012-07-27 MED ORDER — MIDAZOLAM HCL 2 MG/2ML IJ SOLN
1.0000 mg | INTRAMUSCULAR | Status: DC | PRN
  Administered 2012-07-27 (×2): 1 mg via INTRAVENOUS

## 2012-07-27 MED ORDER — FENTANYL CITRATE 0.05 MG/ML IJ SOLN: 25.0000 ug | INTRAMUSCULAR | Status: DC | PRN

## 2012-07-27 MED ORDER — PROPOFOL 10 MG/ML IV BOLUS
INTRAVENOUS | Status: DC | PRN
  Administered 2012-07-27: 160 mg via INTRAVENOUS

## 2012-07-27 MED ORDER — LACTATED RINGERS IV SOLN
INTRAVENOUS | Status: DC
  Administered 2012-07-27: 1000 mL via INTRAVENOUS

## 2012-07-27 MED ORDER — ROCURONIUM BROMIDE 50 MG/5ML IV SOLN
INTRAVENOUS | Status: AC
  Filled 2012-07-27: qty 1

## 2012-07-27 MED ORDER — ONDANSETRON HCL 4 MG/2ML IJ SOLN
INTRAMUSCULAR | Status: AC
  Filled 2012-07-27: qty 2

## 2012-07-27 MED ORDER — CEFAZOLIN SODIUM-DEXTROSE 2-3 GM-% IV SOLR
INTRAVENOUS | Status: AC
  Filled 2012-07-27: qty 50

## 2012-07-27 MED ORDER — ONDANSETRON HCL 4 MG/2ML IJ SOLN: 4.0000 mg | Freq: Once | INTRAMUSCULAR | Status: DC | PRN

## 2012-07-27 SURGICAL SUPPLY — 48 items
BAG HAMPER (MISCELLANEOUS) ×2 IMPLANT
BLADE MORCELLATOR LAPAROSCOPIC (BLADE) IMPLANT
BLADE SURG SZ11 CARB STEEL (BLADE) ×2 IMPLANT
CLOTH BEACON ORANGE TIMEOUT ST (SAFETY) ×2 IMPLANT
COVER LIGHT HANDLE STERIS (MISCELLANEOUS) ×4 IMPLANT
DISSECTOR BLUNT TIP ENDO 5MM (MISCELLANEOUS) IMPLANT
ELECT REM PT RETURN 9FT ADLT (ELECTROSURGICAL) ×2
ELECTRODE REM PT RTRN 9FT ADLT (ELECTROSURGICAL) ×1 IMPLANT
FILTER SMOKE EVAC LAPAROSHD (FILTER) ×2 IMPLANT
FORMALIN 10 PREFIL 480ML (MISCELLANEOUS) ×2 IMPLANT
GAUZE PACKING IODOFORM 1 (PACKING) IMPLANT
GAUZE SPONGE 4X4 16PLY XRAY LF (GAUZE/BANDAGES/DRESSINGS) IMPLANT
GLOVE BIOGEL PI IND STRL 8 (GLOVE) ×1 IMPLANT
GLOVE BIOGEL PI INDICATOR 8 (GLOVE) ×1
GLOVE ECLIPSE 8.0 STRL XLNG CF (GLOVE) ×2 IMPLANT
GLOVE INDICATOR 7.0 STRL GRN (GLOVE) ×2 IMPLANT
GLOVE INDICATOR 7.5 STRL GRN (GLOVE) ×2 IMPLANT
GLOVE SS BIOGEL STRL SZ 6.5 (GLOVE) ×1 IMPLANT
GLOVE SUPERSENSE BIOGEL SZ 6.5 (GLOVE) ×1
GOWN SRG XL XLNG 56XLVL 4 (GOWN DISPOSABLE) ×1 IMPLANT
GOWN STRL NON-REIN XL XLG LVL4 (GOWN DISPOSABLE) ×1
GOWN STRL REIN XL XLG (GOWN DISPOSABLE) ×4 IMPLANT
HAND ACTIVATED (MISCELLANEOUS) ×2 IMPLANT
INST SET LAPROSCOPIC GYN AP (KITS) ×2 IMPLANT
IV NS IRRIG 3000ML ARTHROMATIC (IV SOLUTION) IMPLANT
KIT ROOM TURNOVER APOR (KITS) ×2 IMPLANT
KIT TROCAR LAP GYN (TROCAR) ×2 IMPLANT
MANIFOLD NEPTUNE II (INSTRUMENTS) ×2 IMPLANT
NEEDLE HYPO 25X1 1.5 SAFETY (NEEDLE) ×2 IMPLANT
PACK PERI GYN (CUSTOM PROCEDURE TRAY) ×2 IMPLANT
PAD ARMBOARD 7.5X6 YLW CONV (MISCELLANEOUS) ×2 IMPLANT
PENCIL HANDSWITCHING (ELECTRODE) ×4 IMPLANT
POUCH SPECIMEN RETRIEVAL 10MM (ENDOMECHANICALS) IMPLANT
SCALPEL HARMONIC ACE (MISCELLANEOUS) ×2 IMPLANT
SET BASIN LINEN APH (SET/KITS/TRAYS/PACK) ×2 IMPLANT
SET TUBE IRRIG SUCTION NO TIP (IRRIGATION / IRRIGATOR) ×2 IMPLANT
SOLUTION ANTI FOG 6CC (MISCELLANEOUS) ×2 IMPLANT
SPONGE GAUZE 2X2 8PLY STRL LF (GAUZE/BANDAGES/DRESSINGS) ×6 IMPLANT
STAPLER VISISTAT 35W (STAPLE) ×2 IMPLANT
SUT VIC AB 2-0 CT2 27 (SUTURE) IMPLANT
SUT VIC AB 3-0 SH 27 (SUTURE) ×1
SUT VIC AB 3-0 SH 27X BRD (SUTURE) ×1 IMPLANT
SUT VICRYL 0 UR6 27IN ABS (SUTURE) ×2 IMPLANT
SYR CONTROL 10ML LL (SYRINGE) ×2 IMPLANT
TAPE CLOTH SURG 4X10 WHT LF (GAUZE/BANDAGES/DRESSINGS) ×2 IMPLANT
TRAY FOLEY CATH 14FR (SET/KITS/TRAYS/PACK) ×2 IMPLANT
TUBING HI FLO HEAT INSUFFLATOR (IRRIGATION / IRRIGATOR) ×2 IMPLANT
WARMER LAPAROSCOPE (MISCELLANEOUS) ×2 IMPLANT

## 2012-07-27 NOTE — Anesthesia Preprocedure Evaluation (Signed)
Anesthesia Evaluation  Patient identified by MRN, date of birth, ID band Patient awake    Reviewed: Allergy & Precautions, H&P , NPO status , Patient's Chart, lab work & pertinent test results  History of Anesthesia Complications Negative for: history of anesthetic complications  Airway Mallampati: II TM Distance: >3 FB     Dental  (+) Teeth Intact   Pulmonary Current Smoker,  breath sounds clear to auscultation        Cardiovascular negative cardio ROS  Rhythm:Regular Rate:Normal     Neuro/Psych PSYCHIATRIC DISORDERS Anxiety Depression    GI/Hepatic PUD, Ulcerative colitis, in remission x 7 yrs, no steroids now.    Endo/Other    Renal/GU      Musculoskeletal   Abdominal   Peds  Hematology   Anesthesia Other Findings   Reproductive/Obstetrics                           Anesthesia Physical Anesthesia Plan  ASA: II  Anesthesia Plan: General   Post-op Pain Management:    Induction: Intravenous  Airway Management Planned: Oral ETT  Additional Equipment:   Intra-op Plan:   Post-operative Plan: Extubation in OR  Informed Consent: I have reviewed the patients History and Physical, chart, labs and discussed the procedure including the risks, benefits and alternatives for the proposed anesthesia with the patient or authorized representative who has indicated his/her understanding and acceptance.     Plan Discussed with:   Anesthesia Plan Comments:         Anesthesia Quick Evaluation

## 2012-07-27 NOTE — Op Note (Signed)
Preoperative Diagnosis:  Multiparous female desires permanent sterilization, chooses via bilateral salpingectomy  Postoperative Diagnosis:  Same as above  Procedure:  Laparoscopic Bilateral salpingectomy  Surgeon:  Jacelyn Grip MD  Anaesthesia:  LMA  Findings:  Patient had normal pelvic anatomy and no intraperitoneal abnormalities.  Description of Operation:  Patient was taken to the OR and placed into supine position where she underwent LMA anaesthesia.  She was placed in the dorsal lithotomy position and prepped and draped in the usual sterile fashion.  An incision was made in the umbilicus and dissection taken down to the rectus fascia which was incised and opened.  The non bladed 27m trocar was then placed and the peritoneal cavity was insufflated.  The above noted findings were observed.  Additional non bladed trocars were placed in the midline suprapubic region and in the left lower quadrant, both under direct visualization.  The Harmonic scalpel was used and a bilateral salpingectomy was performed without difficulty and with good hemostasis.      The skin was closed by subcuticular 3-0 vicryl in all 3 incisions.  Dermabond was then placed.  20 cc of exparel or 266 mg was injected SQ in all 3 incision sites. The patient was awakened from anaesthesia and taken to the PACU with all counts being correct x 3.  The patient received Ancef 2 gram and Toradol 30 mg IV preoperatively.  Dosha Broshears H 07/27/2012 9:07 AM

## 2012-07-27 NOTE — Anesthesia Procedure Notes (Signed)
Procedure Name: Intubation Date/Time: 07/27/2012 8:04 AM Performed by: Charmaine Downs Pre-anesthesia Checklist: Emergency Drugs available, Suction available, Patient identified and Patient being monitored Patient Re-evaluated:Patient Re-evaluated prior to inductionOxygen Delivery Method: Circle system utilized Preoxygenation: Pre-oxygenation with 100% oxygen Intubation Type: IV induction Ventilation: Mask ventilation without difficulty Laryngoscope Size: Mac and 3 Grade View: Grade I Tube type: Oral Tube size: 7.0 mm Number of attempts: 1 Airway Equipment and Method: Stylet Placement Confirmation: ETT inserted through vocal cords under direct vision,  positive ETCO2 and breath sounds checked- equal and bilateral Secured at: 22 cm Tube secured with: Tape Dental Injury: Teeth and Oropharynx as per pre-operative assessment

## 2012-07-27 NOTE — Transfer of Care (Signed)
Immediate Anesthesia Transfer of Care Note  Patient: Vanessa Bowen  Procedure(s) Performed: Procedure(s) (LRB) with comments: LAPAROSCOPIC BILATERAL SALPINGECTOMY (N/A)  Patient Location: PACU  Anesthesia Type:General  Level of Consciousness: awake, alert  and patient cooperative  Airway & Oxygen Therapy: Patient Spontanous Breathing and Patient connected to face mask oxygen  Post-op Assessment: Report given to PACU RN, Post -op Vital signs reviewed and stable and Patient moving all extremities  Post vital signs: Reviewed and stable  Complications: No apparent anesthesia complications

## 2012-07-27 NOTE — H&P (Signed)
Vanessa Bowen is an 37 y.o. female. Multiparous female who desires permanent sterilization via bilateral salpingectomy.  She also understands the failure rate.     Past Medical History  Diagnosis Date  . Colitis, ulcerative   . Depression   . Anxiety   . Hypothyroidism   . Arthritis   . Chronic back pain     Past Surgical History  Procedure Date  . Knee surgery   . Ankle surgery   . Ovarian cyst removal   . Cesarean section   . Breast augmentation     History reviewed. No pertinent family history.  Social History:  reports that she has been smoking.  She does not have any smokeless tobacco history on file. She reports that she drinks alcohol. She reports that she does not use illicit drugs.  Allergies:  Allergies  Allergen Reactions  . Doxycycline Nausea And Vomiting  . Sulfonamide Derivatives Nausea And Vomiting    Prescriptions prior to admission  Medication Sig Dispense Refill  . ALPRAZolam (XANAX) 1 MG tablet Take 1 mg by mouth 4 (four) times daily.       . DULoxetine (CYMBALTA) 60 MG capsule Take 60 mg by mouth daily.      . furosemide (LASIX) 20 MG tablet Take 20 mg by mouth daily.      Marland Kitchen HYDROcodone-acetaminophen (NORCO) 7.5-325 MG per tablet Take 1 tablet by mouth every 4 (four) hours as needed. Back injury      . levothyroxine (SYNTHROID, LEVOTHROID) 88 MCG tablet Take 88 mcg by mouth daily.      . naproxen sodium (ALEVE) 220 MG tablet Take 220 mg by mouth 2 (two) times daily as needed. Back or headache pain      . Phentermine-Topiramate (QSYMIA) 7.5-46 MG CP24 Take 1 capsule by mouth daily.        ROS  Review of Systems  Constitutional: Negative for fever, chills, weight loss, malaise/fatigue and diaphoresis.  HENT: Negative for hearing loss, ear pain, nosebleeds, congestion, sore throat, neck pain, tinnitus and ear discharge.   Eyes: Negative for blurred vision, double vision, photophobia, pain, discharge and redness.  Respiratory: Negative for  cough, hemoptysis, sputum production, shortness of breath, wheezing and stridor.   Cardiovascular: Negative for chest pain, palpitations, orthopnea, claudication, leg swelling and PND.  Gastrointestinal: Negative for abdominal pain. Negative for heartburn, nausea, vomiting, diarrhea, constipation, blood in stool and melena.  Genitourinary: Negative for dysuria, urgency, frequency, hematuria and flank pain.  Musculoskeletal: Negative for myalgias, back pain, joint pain and falls.  Skin: Negative for itching and rash.  Neurological: Negative for dizziness, tingling, tremors, sensory change, speech change, focal weakness, seizures, loss of consciousness, weakness and headaches.  Endo/Heme/Allergies: Negative for environmental allergies and polydipsia. Does not bruise/bleed easily.  Psychiatric/Behavioral: Negative for depression, suicidal ideas, hallucinations, memory loss and substance abuse. The patient is not nervous/anxious and does not have insomnia.     Blood pressure 103/66, pulse 85, temperature 98.3 F (36.8 C), temperature source Oral, resp. rate 16, SpO2 100.00%. Physical Exam Physical Exam  Vitals reviewed. Constitutional: She is oriented to person, place, and time. She appears well-developed and well-nourished.  HENT:  Head: Normocephalic and atraumatic.  Right Ear: External ear normal.  Left Ear: External ear normal.  Nose: Nose normal.  Mouth/Throat: Oropharynx is clear and moist.  Eyes: Conjunctivae and EOM are normal. Pupils are equal, round, and reactive to light. Right eye exhibits no discharge. Left eye exhibits no discharge. No scleral icterus.  Neck:  Normal range of motion. Neck supple. No tracheal deviation present. No thyromegaly present.  Cardiovascular: Normal rate, regular rhythm, normal heart sounds and intact distal pulses.  Exam reveals no gallop and no friction rub.   No murmur heard. Respiratory: Effort normal and breath sounds normal. No respiratory distress.  She has no wheezes. She has no rales. She exhibits no tenderness.  GI: Soft. Bowel sounds are normal. She exhibits no distension and no mass. There is tenderness. There is no rebound and no guarding.  Genitourinary:       Vulva is normal without lesions Vagina is pink moist without discharge Cervix normal in appearance and pap is normal Uterus is normal Adnexa is negative with normal sized ovaries by sonogram  Musculoskeletal: Normal range of motion. She exhibits no edema and no tenderness.  Neurological: She is alert and oriented to person, place, and time. She has normal reflexes. She displays normal reflexes. No cranial nerve deficit. She exhibits normal muscle tone. Coordination normal.  Skin: Skin is warm and dry. No rash noted. No erythema. No pallor.  Psychiatric: She has a normal mood and affect. Her behavior is normal. Judgment and thought content normal.    Recent Results (from the past 336 hour(s))  SURGICAL PCR SCREEN   Collection Time   07/22/12 11:26 AM      Component Value Range   MRSA, PCR NEGATIVE  NEGATIVE   Staphylococcus aureus NEGATIVE  NEGATIVE  URINALYSIS, ROUTINE W REFLEX MICROSCOPIC   Collection Time   07/22/12 11:26 AM      Component Value Range   Color, Urine YELLOW  YELLOW   APPearance CLEAR  CLEAR   Specific Gravity, Urine 1.025  1.005 - 1.030   pH 6.0  5.0 - 8.0   Glucose, UA NEGATIVE  NEGATIVE mg/dL   Hgb urine dipstick NEGATIVE  NEGATIVE   Bilirubin Urine SMALL (*) NEGATIVE   Ketones, ur NEGATIVE  NEGATIVE mg/dL   Protein, ur TRACE (*) NEGATIVE mg/dL   Urobilinogen, UA 1.0  0.0 - 1.0 mg/dL   Nitrite NEGATIVE  NEGATIVE   Leukocytes, UA NEGATIVE  NEGATIVE  URINE MICROSCOPIC-ADD ON   Collection Time   07/22/12 11:26 AM      Component Value Range   Squamous Epithelial / LPF MANY (*) RARE   RBC / HPF 0-2  <3 RBC/hpf   Bacteria, UA MANY (*) RARE  CBC   Collection Time   07/22/12 12:00 PM      Component Value Range   WBC 8.4  4.0 - 10.5 K/uL   RBC  4.65  3.87 - 5.11 MIL/uL   Hemoglobin 14.5  12.0 - 15.0 g/dL   HCT 41.2  36.0 - 46.0 %   MCV 88.6  78.0 - 100.0 fL   MCH 31.2  26.0 - 34.0 pg   MCHC 35.2  30.0 - 36.0 g/dL   RDW 12.5  11.5 - 15.5 %   Platelets 426 (*) 150 - 400 K/uL  COMPREHENSIVE METABOLIC PANEL   Collection Time   07/22/12 12:00 PM      Component Value Range   Sodium 137  135 - 145 mEq/L   Potassium 3.8  3.5 - 5.1 mEq/L   Chloride 101  96 - 112 mEq/L   CO2 22  19 - 32 mEq/L   Glucose, Bld 110 (*) 70 - 99 mg/dL   BUN 16  6 - 23 mg/dL   Creatinine, Ser 0.93  0.50 - 1.10 mg/dL   Calcium 9.9  8.4 - 10.5 mg/dL   Total Protein 7.5  6.0 - 8.3 g/dL   Albumin 4.6  3.5 - 5.2 g/dL   AST 11  0 - 37 U/L   ALT 11  0 - 35 U/L   Alkaline Phosphatase 69  39 - 117 U/L   Total Bilirubin 0.4  0.3 - 1.2 mg/dL   GFR calc non Af Amer 78 (*) >90 mL/min   GFR calc Af Amer 90 (*) >90 mL/min  HCG, QUANTITATIVE, PREGNANCY   Collection Time   07/22/12 12:00 PM      Component Value Range   hCG, Beta Chain, Quant, S 1  <5 mIU/mL       Assessment/Plan: 1.  Multiparous female desires permanent sterilization, via bilateral salpingectomy  Patient understands the permanent nature of the procedure and also the failure rate of 1/300.  Taeler Winning H 07/27/2012, 7:38 AM

## 2012-07-27 NOTE — Anesthesia Postprocedure Evaluation (Signed)
  Anesthesia Post-op Note  Patient: Vanessa Bowen  Procedure(s) Performed: Procedure(s) (LRB) with comments: LAPAROSCOPIC BILATERAL SALPINGECTOMY (N/A)  Patient Location: PACU  Anesthesia Type:General  Level of Consciousness: awake, alert , oriented and patient cooperative  Airway and Oxygen Therapy: Patient Spontanous Breathing  Post-op Pain: 2 /10, mild  Post-op Assessment: Post-op Vital signs reviewed, Patient's Cardiovascular Status Stable, Respiratory Function Stable, RESPIRATORY FUNCTION UNSTABLE, No signs of Nausea or vomiting and Pain level controlled  Post-op Vital Signs: Reviewed and stable  Complications: No apparent anesthesia complications

## 2012-07-28 ENCOUNTER — Encounter (HOSPITAL_COMMUNITY): Payer: Self-pay | Admitting: Obstetrics & Gynecology

## 2014-10-10 ENCOUNTER — Ambulatory Visit: Payer: Self-pay | Admitting: Obstetrics & Gynecology

## 2014-10-16 ENCOUNTER — Other Ambulatory Visit: Payer: Self-pay | Admitting: Obstetrics & Gynecology

## 2014-10-23 ENCOUNTER — Other Ambulatory Visit: Payer: Self-pay | Admitting: Obstetrics & Gynecology

## 2014-11-02 ENCOUNTER — Other Ambulatory Visit: Payer: Self-pay | Admitting: Obstetrics & Gynecology

## 2014-11-08 ENCOUNTER — Ambulatory Visit (INDEPENDENT_AMBULATORY_CARE_PROVIDER_SITE_OTHER): Payer: Medicaid Other | Admitting: Obstetrics & Gynecology

## 2014-11-08 ENCOUNTER — Encounter: Payer: Self-pay | Admitting: Obstetrics & Gynecology

## 2014-11-08 ENCOUNTER — Other Ambulatory Visit (HOSPITAL_COMMUNITY)
Admission: RE | Admit: 2014-11-08 | Discharge: 2014-11-08 | Disposition: A | Discharge status: Home / Self Care | Payer: Medicaid Other | Source: Ambulatory Visit | Attending: Obstetrics & Gynecology | Admitting: Obstetrics & Gynecology

## 2014-11-08 VITALS — BP 120/80 | HR 72 | Ht 63.0 in | Wt 154.0 lb

## 2014-11-08 DIAGNOSIS — Z1151 Encounter for screening for human papillomavirus (HPV): Secondary | ICD-10-CM | POA: Insufficient documentation

## 2014-11-08 DIAGNOSIS — Z01419 Encounter for gynecological examination (general) (routine) without abnormal findings: Secondary | ICD-10-CM

## 2014-11-08 DIAGNOSIS — Z0000 Encounter for general adult medical examination without abnormal findings: Secondary | ICD-10-CM | POA: Diagnosis not present

## 2014-11-08 MED ORDER — SILVER SULFADIAZINE 1 % EX CREA: TOPICAL_CREAM | CUTANEOUS | Status: DC

## 2014-11-08 NOTE — Addendum Note (Signed)
Addended by: Doyne Keel on: 11/08/2014 01:35 PM   Modules accepted: Orders

## 2014-11-08 NOTE — Progress Notes (Signed)
Patient ID: Vanessa Bowen, female   DOB: 07/14/1975, 39 y.o.   MRN: [id number] Subjective:     Vanessa Bowen is a 39 y.o. female here for a routine exam.  No LMP recorded. No obstetric history on file. Birth Control Method:  salpingectomy Menstrual Calendar(currently): irregular  Current complaints: periods.   Current acute medical issues:  none   Recent Gynecologic History No LMP recorded. Last Pap: 2015,  normal Last mammogram: ,    Past Medical History  Diagnosis Date  . Colitis, ulcerative   . Depression   . Anxiety   . Hypothyroidism   . Arthritis   . Chronic back pain     Past Surgical History  Procedure Laterality Date  . Knee surgery    . Ankle surgery    . Ovarian cyst removal    . Cesarean section    . Breast augmentation    . Laparoscopic bilateral salpingectomy  07/27/2012    Procedure: LAPAROSCOPIC BILATERAL SALPINGECTOMY;  Surgeon: Florian Buff, MD;  Location: [facility];  Service: Gynecology;  Laterality: N/A;    OB History    No data available      History   Social History  . Marital Status: Divorced    Spouse Name: N/A  . Number of Children: N/A  . Years of Education: N/A   Social History Main Topics  . Smoking status: Current Every Day Smoker -- 0.50 packs/day for 15 years  . Smokeless tobacco: Not on file  . Alcohol Use: Yes     Comment: occasionally  . Drug Use: No  . Sexual Activity: Not on file   Other Topics Concern  . None   Social History Narrative    Family History  Problem Relation Age of Onset  . Cancer Maternal Grandmother   . Cancer Maternal Grandfather      Current outpatient prescriptions:  .  DULoxetine (CYMBALTA) 60 MG capsule, Take 60 mg by mouth daily., Disp: , Rfl:  .  furosemide (LASIX) 20 MG tablet, Take 20 mg by mouth daily., Disp: , Rfl:  .  levothyroxine (SYNTHROID, LEVOTHROID) 88 MCG tablet, Take 88 mcg by mouth daily., Disp: , Rfl:  .  ALPRAZolam (XANAX) 1 MG tablet, Take 1 mg by mouth 4 (four)  times daily. , Disp: , Rfl:  .  naproxen sodium (ALEVE) 220 MG tablet, Take 220 mg by mouth 2 (two) times daily as needed. Back or headache pain, Disp: , Rfl:  .  ondansetron (ZOFRAN) 8 MG tablet, Take 1 tablet (8 mg total) by mouth every 8 (eight) hours as needed for nausea. (Patient not taking: Reported on 11/08/2014), Disp: 10 tablet, Rfl: 0 .  oxyCODONE-acetaminophen (PERCOCET) 7.5-325 MG per tablet, Take 1-2 tablets by mouth every 6 (six) hours as needed for pain. (Patient not taking: Reported on 11/08/2014), Disp: 30 tablet, Rfl: 0 .  Phentermine-Topiramate (QSYMIA) 7.5-46 MG CP24, Take 1 capsule by mouth daily., Disp: , Rfl:   Review of Systems  Review of Systems  Constitutional: Negative for fever, chills, weight loss, malaise/fatigue and diaphoresis.  HENT: Negative for hearing loss, ear pain, nosebleeds, congestion, sore throat, neck pain, tinnitus and ear discharge.   Eyes: Negative for blurred vision, double vision, photophobia, pain, discharge and redness.  Respiratory: Negative for cough, hemoptysis, sputum production, shortness of breath, wheezing and stridor.   Cardiovascular: Negative for chest pain, palpitations, orthopnea, claudication, leg swelling and PND.  Gastrointestinal: negative for abdominal pain. Negative for heartburn, nausea, vomiting, diarrhea, constipation,  blood in stool and melena.  Genitourinary: Negative for dysuria, urgency, frequency, hematuria and flank pain.  Musculoskeletal: Negative for myalgias, back pain, joint pain and falls.  Skin: Negative for itching and rash.  Neurological: Negative for dizziness, tingling, tremors, sensory change, speech change, focal weakness, seizures, loss of consciousness, weakness and headaches.  Endo/Heme/Allergies: Negative for environmental allergies and polydipsia. Does not bruise/bleed easily.  Psychiatric/Behavioral: Negative for depression, suicidal ideas, hallucinations, memory loss and substance abuse. The patient is  not nervous/anxious and does not have insomnia.        Objective:  There were no vitals taken for this visit.   Physical Exam  Vitals reviewed. Constitutional: She is oriented to person, place, and time. She appears well-developed and well-nourished.  HENT:  Head: Normocephalic and atraumatic.        Right Ear: External ear normal.  Left Ear: External ear normal.  Nose: Nose normal.  Mouth/Throat: Oropharynx is clear and moist.  Eyes: Conjunctivae and EOM are normal. Pupils are equal, round, and reactive to light. Right eye exhibits no discharge. Left eye exhibits no discharge. No scleral icterus.  Neck: Normal range of motion. Neck supple. No tracheal deviation present. No thyromegaly present.  Cardiovascular: Normal rate, regular rhythm, normal heart sounds and intact distal pulses.  Exam reveals no gallop and no friction rub.   No murmur heard. Respiratory: Effort normal and breath sounds normal. No respiratory distress. She has no wheezes. She has no rales. She exhibits no tenderness.  GI: Soft. Bowel sounds are normal. She exhibits no distension and no mass. There is no tenderness. There is no rebound and no guarding.  Genitourinary:  Breasts no masses skin changes or nipple changes bilaterally, s/p implants      Vulva is normal without lesions Vagina is pink moist without discharge Cervix normal in appearance and pap is done Uterus is normal size shape and contour Adnexa is negative with normal sized ovaries   Musculoskeletal: Normal range of motion. She exhibits no edema and no tenderness.  Neurological: She is alert and oriented to person, place, and time. She has normal reflexes. She displays normal reflexes. No cranial nerve deficit. She exhibits normal muscle tone. Coordination normal.  Skin: Skin is warm and dry. No rash noted. No erythema. No pallor.  Psychiatric: She has a normal mood and affect. Her behavior is normal. Judgment and thought content normal.        Assessment:    Healthy female exam.    Plan:    Contraception: tubal ligation. Follow up in: 1 year.

## 2014-11-10 ENCOUNTER — Emergency Department (HOSPITAL_COMMUNITY)
Admission: EM | Admit: 2014-11-10 | Discharge: 2014-11-10 | Disposition: A | Discharge status: Home / Self Care | Payer: Medicaid Other | Attending: Emergency Medicine | Admitting: Emergency Medicine

## 2014-11-10 ENCOUNTER — Emergency Department (HOSPITAL_COMMUNITY): Payer: Medicaid Other

## 2014-11-10 ENCOUNTER — Encounter (HOSPITAL_COMMUNITY): Payer: Self-pay | Admitting: *Deleted

## 2014-11-10 DIAGNOSIS — R630 Anorexia: Secondary | ICD-10-CM | POA: Insufficient documentation

## 2014-11-10 DIAGNOSIS — Z720 Tobacco use: Secondary | ICD-10-CM | POA: Diagnosis not present

## 2014-11-10 DIAGNOSIS — F329 Major depressive disorder, single episode, unspecified: Secondary | ICD-10-CM | POA: Insufficient documentation

## 2014-11-10 DIAGNOSIS — R109 Unspecified abdominal pain: Secondary | ICD-10-CM | POA: Diagnosis present

## 2014-11-10 DIAGNOSIS — Z3202 Encounter for pregnancy test, result negative: Secondary | ICD-10-CM | POA: Insufficient documentation

## 2014-11-10 DIAGNOSIS — G8929 Other chronic pain: Secondary | ICD-10-CM | POA: Diagnosis not present

## 2014-11-10 DIAGNOSIS — R197 Diarrhea, unspecified: Secondary | ICD-10-CM | POA: Diagnosis not present

## 2014-11-10 DIAGNOSIS — E039 Hypothyroidism, unspecified: Secondary | ICD-10-CM | POA: Insufficient documentation

## 2014-11-10 DIAGNOSIS — Z9889 Other specified postprocedural states: Secondary | ICD-10-CM | POA: Diagnosis not present

## 2014-11-10 DIAGNOSIS — M1990 Unspecified osteoarthritis, unspecified site: Secondary | ICD-10-CM | POA: Insufficient documentation

## 2014-11-10 DIAGNOSIS — F419 Anxiety disorder, unspecified: Secondary | ICD-10-CM | POA: Insufficient documentation

## 2014-11-10 DIAGNOSIS — Z79899 Other long term (current) drug therapy: Secondary | ICD-10-CM | POA: Insufficient documentation

## 2014-11-10 DIAGNOSIS — R112 Nausea with vomiting, unspecified: Secondary | ICD-10-CM | POA: Insufficient documentation

## 2014-11-10 DIAGNOSIS — Z8719 Personal history of other diseases of the digestive system: Secondary | ICD-10-CM | POA: Insufficient documentation

## 2014-11-10 LAB — CBC
HCT: 41.6 % (ref 36.0–46.0)
Hemoglobin: 14 g/dL (ref 12.0–15.0)
MCH: 30.8 pg (ref 26.0–34.0)
MCHC: 33.7 g/dL (ref 30.0–36.0)
MCV: 91.6 fL (ref 78.0–100.0)
PLATELETS: 385 10*3/uL (ref 150–400)
RBC: 4.54 MIL/uL (ref 3.87–5.11)
RDW: 13.9 % (ref 11.5–15.5)
WBC: 11.8 10*3/uL — AB (ref 4.0–10.5)

## 2014-11-10 LAB — PREGNANCY, URINE: Preg Test, Ur: NEGATIVE

## 2014-11-10 LAB — URINALYSIS, ROUTINE W REFLEX MICROSCOPIC
Bilirubin Urine: NEGATIVE
Glucose, UA: NEGATIVE mg/dL
LEUKOCYTES UA: NEGATIVE
Nitrite: NEGATIVE
Protein, ur: NEGATIVE mg/dL
Urobilinogen, UA: 0.2 mg/dL (ref 0.0–1.0)
pH: 6 (ref 5.0–8.0)

## 2014-11-10 LAB — COMPREHENSIVE METABOLIC PANEL
ALT: 15 U/L (ref 14–54)
AST: 17 U/L (ref 15–41)
Albumin: 4.1 g/dL (ref 3.5–5.0)
Alkaline Phosphatase: 57 U/L (ref 38–126)
Anion gap: 8 (ref 5–15)
BUN: 12 mg/dL (ref 6–20)
CO2: 26 mmol/L (ref 22–32)
Calcium: 9.3 mg/dL (ref 8.9–10.3)
Chloride: 105 mmol/L (ref 101–111)
Creatinine, Ser: 0.86 mg/dL (ref 0.44–1.00)
GFR calc Af Amer: >60 mL/min (ref >60)
GLUCOSE: 113 mg/dL — AB (ref 65–99)
Potassium: 4.3 mmol/L (ref 3.5–5.1)
SODIUM: 139 mmol/L (ref 135–145)
TOTAL PROTEIN: 7.2 g/dL (ref 6.5–8.1)
Total Bilirubin: 0.3 mg/dL (ref 0.3–1.2)

## 2014-11-10 LAB — LIPASE, BLOOD: Lipase: 31 U/L (ref 22–51)

## 2014-11-10 LAB — POC OCCULT BLOOD, ED: FECAL OCCULT BLD: NEGATIVE

## 2014-11-10 LAB — URINE MICROSCOPIC-ADD ON

## 2014-11-10 LAB — I-STAT CG4 LACTIC ACID, ED: LACTIC ACID, VENOUS: 0.85 mmol/L (ref 0.5–2.0)

## 2014-11-10 MED ORDER — MORPHINE SULFATE 4 MG/ML IJ SOLN
4.0000 mg | Freq: Once | INTRAMUSCULAR | Status: AC
  Administered 2014-11-10: 4 mg via INTRAVENOUS
  Filled 2014-11-10: qty 1

## 2014-11-10 MED ORDER — ONDANSETRON HCL 4 MG PO TABS: 4.0000 mg | ORAL_TABLET | Freq: Four times a day (QID) | ORAL | Status: DC

## 2014-11-10 MED ORDER — ONDANSETRON HCL 4 MG/2ML IJ SOLN
4.0000 mg | Freq: Once | INTRAMUSCULAR | Status: AC
  Administered 2014-11-10: 4 mg via INTRAVENOUS
  Filled 2014-11-10: qty 2

## 2014-11-10 MED ORDER — SODIUM CHLORIDE 0.9 % IV BOLUS (SEPSIS)
1000.0000 mL | Freq: Once | INTRAVENOUS | Status: AC
  Administered 2014-11-10: 1000 mL via INTRAVENOUS

## 2014-11-10 MED ORDER — IOHEXOL 300 MG/ML  SOLN
100.0000 mL | Freq: Once | INTRAMUSCULAR | Status: AC | PRN
  Administered 2014-11-10: 100 mL via INTRAVENOUS

## 2014-11-10 MED ORDER — ONDANSETRON HCL 4 MG/2ML IJ SOLN: 4.0000 mg | Freq: Once | INTRAMUSCULAR | Status: DC

## 2014-11-10 NOTE — ED Notes (Signed)
Diarrhea x 6 days. No relief from imodium. States stool is now green in color. Abdominal pain and generalized weakness. Slight nausea. Pt states hx of ulcerative colitis and believes she is having a flare up.

## 2014-11-10 NOTE — ED Provider Notes (Signed)
CSN: [id number]     Arrival date & time 11/10/14  1443 History   First MD Initiated Contact with Patient 11/10/14 1822     Chief Complaint  Patient presents with  . Abdominal Pain     (Consider location/radiation/quality/duration/timing/severity/associated sxs/prior Treatment) HPI Comments: Patient reports six-day history of diarrhea and loose stools not improved with Imodium. Stool has been brown and green. Multiple times a day bradycardia from 5-10 episodes. Denies blood in the stool. Nausea but no vomiting. No fever. Reports history of ulcerative colitis but has not had GI follow-up in 12 years. She does not take any medications for ulcerative colitis. She is worried that she is having a flareup. Denies any fevers or chills. Denies any chest pain or shortness of breath. Denies any recent antibiotic use or travel. No urinary or vaginal symptoms. Had gynecological exam 2 days ago that was benign. She reports having a colonoscopy 12 years ago as well as an EGD more recently that showed esophagitis.  The history is provided by the patient.    Past Medical History  Diagnosis Date  . Colitis, ulcerative   . Depression   . Anxiety   . Hypothyroidism   . Arthritis   . Chronic back pain    Past Surgical History  Procedure Laterality Date  . Knee surgery    . Ankle surgery    . Ovarian cyst removal    . Cesarean section    . Breast augmentation    . Laparoscopic bilateral salpingectomy  07/27/2012    Procedure: LAPAROSCOPIC BILATERAL SALPINGECTOMY;  Surgeon: Florian Buff, MD;  Location: [facility];  Service: Gynecology;  Laterality: N/A;   Family History  Problem Relation Age of Onset  . Cancer Maternal Grandmother   . Cancer Maternal Grandfather    History  Substance Use Topics  . Smoking status: Current Every Day Smoker -- 0.50 packs/day for 15 years    Types: Cigarettes  . Smokeless tobacco: Not on file  . Alcohol Use: Yes     Comment: occasionally   OB History    No data  available     Review of Systems  Constitutional: Positive for activity change, appetite change and fatigue. Negative for fever.  HENT: Negative for congestion and rhinorrhea.   Respiratory: Negative for cough, chest tightness and shortness of breath.   Cardiovascular: Negative for chest pain.  Gastrointestinal: Positive for nausea, abdominal pain and diarrhea. Negative for vomiting and blood in stool.  Genitourinary: Negative for dysuria, hematuria, vaginal bleeding and vaginal discharge.  Musculoskeletal: Negative for myalgias and arthralgias.  Skin: Negative for wound.  Neurological: Negative for dizziness, weakness and headaches.  A complete 10 system review of systems was obtained and all systems are negative except as noted in the HPI and PMH.      Allergies  Doxycycline and Sulfonamide derivatives  Home Medications   Prior to Admission medications   Medication Sig Start Date End Date Taking? Authorizing Provider  amitriptyline (ELAVIL) 50 MG tablet Take 100 mg by mouth at bedtime. 10/17/14  Yes Historical Provider, MD  DULoxetine (CYMBALTA) 60 MG capsule Take 60 mg by mouth daily.   Yes Historical Provider, MD  furosemide (LASIX) 20 MG tablet Take 20 mg by mouth daily as needed for fluid.    Yes Historical Provider, MD  gabapentin (NEURONTIN) 100 MG capsule Take 200 mg by mouth at bedtime.  10/17/14  Yes Historical Provider, MD  HYDROcodone-acetaminophen (NORCO) 7.5-325 MG per tablet Take 1 tablet by mouth  every 4 (four) hours as needed. For pain 10/24/14  Yes Historical Provider, MD  levothyroxine (SYNTHROID, LEVOTHROID) 137 MCG tablet Take 137 mcg by mouth daily before breakfast.   Yes Historical Provider, MD  loperamide (IMODIUM A-D) 2 MG tablet Take 2 mg by mouth 4 (four) times daily as needed for diarrhea or loose stools.   Yes Historical Provider, MD  naproxen sodium (ALEVE) 220 MG tablet Take 220 mg by mouth 2 (two) times daily as needed. Back or headache pain   Yes  Historical Provider, MD  silver sulfADIAZINE (SILVADENE) 1 % cream Use to area 2-3 times per day Patient taking differently: Apply 1 application topically 3 (three) times daily. Use to area 2-3 times per day 11/08/14  Yes Florian Buff, MD  tiZANidine (ZANAFLEX) 4 MG tablet Take 1 tablet by mouth every 8 (eight) hours as needed. For spasms 10/31/14  Yes Historical Provider, MD  ondansetron (ZOFRAN) 4 MG tablet Take 1 tablet (4 mg total) by mouth every 6 (six) hours. 11/10/14   Ezequiel Essex, MD  oxyCODONE-acetaminophen (PERCOCET) 7.5-325 MG per tablet Take 1-2 tablets by mouth every 6 (six) hours as needed for pain. Patient not taking: Reported on 11/08/2014 07/27/12   Florian Buff, MD   BP 99/65 mmHg  Pulse 75  Temp(Src) 98 F (36.7 C) (Oral)  Resp 15  Ht 5' 3"  (1.6 m)  Wt 151 lb (68.493 kg)  BMI 26.76 kg/m2  SpO2 99%  LMP 10/14/2014 Physical Exam  Constitutional: She is oriented to person, place, and time. She appears well-developed and well-nourished. No distress.  HENT:  Head: Normocephalic and atraumatic.  Mouth/Throat: Oropharynx is clear and moist. No oropharyngeal exudate.  Eyes: Conjunctivae and EOM are normal. Pupils are equal, round, and reactive to light.  Neck: Normal range of motion. Neck supple.  No meningismus.  Cardiovascular: Normal rate, regular rhythm, normal heart sounds and intact distal pulses.   No murmur heard. Pulmonary/Chest: Effort normal and breath sounds normal. No respiratory distress.  Abdominal: Soft. There is tenderness. There is no rebound and no guarding.  Mild diffuse tenderness, worse in the right lower quadrant, no guarding or rebound  Genitourinary:  Chaperone present, no gross blood, no hemorrhoids  Musculoskeletal: Normal range of motion. She exhibits no edema or tenderness.  No CVA tenderness  Neurological: She is alert and oriented to person, place, and time. No cranial nerve deficit. She exhibits normal muscle tone. Coordination normal.   No ataxia on finger to nose bilaterally. No pronator drift. 5/5 strength throughout. CN 2-12 intact. Negative Romberg. Equal grip strength. Sensation intact. Gait is normal.   Skin: Skin is warm.  Psychiatric: She has a normal mood and affect. Her behavior is normal.  Nursing note and vitals reviewed.   ED Course  Procedures (including critical care time) Labs Review Labs Reviewed  CBC - Abnormal; Notable for the following:    WBC 11.8 (*)    All other components within normal limits  URINALYSIS, ROUTINE W REFLEX MICROSCOPIC - Abnormal; Notable for the following:    APPearance HAZY (*)    Specific Gravity, Urine >1.030 (*)    Hgb urine dipstick MODERATE (*)    Ketones, ur TRACE (*)    All other components within normal limits  COMPREHENSIVE METABOLIC PANEL - Abnormal; Notable for the following:    Glucose, Bld 113 (*)    All other components within normal limits  URINE MICROSCOPIC-ADD ON - Abnormal; Notable for the following:    Bacteria,  UA FEW (*)    Crystals CA OXALATE CRYSTALS (*)    All other components within normal limits  CLOSTRIDIUM DIFFICILE BY PCR  URINE CULTURE  PREGNANCY, URINE  LIPASE, BLOOD  POC OCCULT BLOOD, ED  I-STAT CG4 LACTIC ACID, ED    Imaging Review Ct Abdomen Pelvis W Contrast  11/10/2014   CLINICAL DATA:  Acute onset of generalized abdominal pain and diarrhea. Initial encounter.  EXAM: CT ABDOMEN AND PELVIS WITH CONTRAST  TECHNIQUE: Multidetector CT imaging of the abdomen and pelvis was performed using the standard protocol following bolus administration of intravenous contrast.  CONTRAST:  119m OMNIPAQUE IOHEXOL 300 MG/ML  SOLN  COMPARISON:  None.  FINDINGS: The visualized lung bases are clear. Bilateral breast implants are partially imaged.  The liver and spleen are unremarkable in appearance. The gallbladder is decompressed and likely within normal limits. The pancreas and adrenal glands are unremarkable.  The kidneys are unremarkable in appearance.  There is no evidence of hydronephrosis. No renal or ureteral stones are seen. No perinephric stranding is appreciated.  No free fluid is identified. The small bowel is unremarkable in appearance. The stomach is within normal limits. No acute vascular abnormalities are seen. A retroaortic left renal vein is noted.  The appendix is normal in caliber, without evidence of appendicitis. The colon is unremarkable in appearance.  The bladder is mildly distended and grossly unremarkable. The uterus is unremarkable in appearance. The ovaries are relatively symmetric. No suspicious adnexal masses are seen. No inguinal lymphadenopathy is seen.  No acute osseous abnormalities are identified.  IMPRESSION: Unremarkable contrast-enhanced CT of the abdomen and pelvis.   Electronically Signed   By: JGarald BaldingM.D.   On: 11/10/2014 20:18     EKG Interpretation None      MDM   Final diagnoses:  Diarrhea   Diarrhea with history of ulcerative colitis by report. No medications for this condition. No fever. Abdomen soft with tenderness in the right lower quadrant.  Lactate normal.  FOBT negative. Hemoglobin stable.  Workup reassuring. UA with hematuria and few bacteria. Culture will be sent. No diarrhea throughout stay in the ED. CT scan shows no evidence of active colitis. Abdomen is soft without peritoneal signs. Patient is tolerating by mouth. Unable to give sample for C dif testing.  She appears stable for discharge and outpatient GI follow-up. We'll give Zofran for home use. Return precautions discussed.  SEzequiel Essex MD 11/11/14 [phone]

## 2014-11-10 NOTE — Discharge Instructions (Signed)
Diarrhea Follow up with your doctor this week. Return to the ED if you develop new or worsening symptoms. Diarrhea is frequent loose and watery bowel movements. It can cause you to feel weak and dehydrated. Dehydration can cause you to become tired and thirsty, have a dry mouth, and have decreased urination that often is dark yellow. Diarrhea is a sign of another problem, most often an infection that will not last long. In most cases, diarrhea typically lasts 2-3 days. However, it can last longer if it is a sign of something more serious. It is important to treat your diarrhea as directed by your caregiver to lessen or prevent future episodes of diarrhea. CAUSES  Some common causes include:  Gastrointestinal infections caused by viruses, bacteria, or parasites.  Food poisoning or food allergies.  Certain medicines, such as antibiotics, chemotherapy, and laxatives.  Artificial sweeteners and fructose.  Digestive disorders. HOME CARE INSTRUCTIONS  Ensure adequate fluid intake (hydration): Have 1 cup (8 oz) of fluid for each diarrhea episode. Avoid fluids that contain simple sugars or sports drinks, fruit juices, whole milk products, and sodas. Your urine should be clear or pale yellow if you are drinking enough fluids. Hydrate with an oral rehydration solution that you can purchase at pharmacies, retail stores, and online. You can prepare an oral rehydration solution at home by mixing the following ingredients together:   - tsp table salt.   tsp baking soda.   tsp salt substitute containing potassium chloride.  1  tablespoons sugar.  1 L (34 oz) of water.  Certain foods and beverages may increase the speed at which food moves through the gastrointestinal (GI) tract. These foods and beverages should be avoided and include:  Caffeinated and alcoholic beverages.  High-fiber foods, such as raw fruits and vegetables, nuts, seeds, and whole grain breads and cereals.  Foods and beverages  sweetened with sugar alcohols, such as xylitol, sorbitol, and mannitol.  Some foods may be well tolerated and may help thicken stool including:  Starchy foods, such as rice, toast, pasta, low-sugar cereal, oatmeal, grits, baked potatoes, crackers, and bagels.  Bananas.  Applesauce.  Add probiotic-rich foods to help increase healthy bacteria in the GI tract, such as yogurt and fermented milk products.  Wash your hands well after each diarrhea episode.  Only take over-the-counter or prescription medicines as directed by your caregiver.  Take a warm bath to relieve any burning or pain from frequent diarrhea episodes. SEEK IMMEDIATE MEDICAL CARE IF:   You are unable to keep fluids down.  You have persistent vomiting.  You have blood in your stool, or your stools are black and tarry.  You do not urinate in 6-8 hours, or there is only a small amount of very dark urine.  You have abdominal pain that increases or localizes.  You have weakness, dizziness, confusion, or light-headedness.  You have a severe headache.  Your diarrhea gets worse or does not get better.  You have a fever or persistent symptoms for more than 2-3 days.  You have a fever and your symptoms suddenly get worse. MAKE SURE YOU:   Understand these instructions.  Will watch your condition.  Will get help right away if you are not doing well or get worse. Document Released: 05/29/2002 Document Revised: 10/23/2013 Document Reviewed: 02/14/2012 [facility] Patient Information 2015 [place], [place]. This information is not intended to replace advice given to you by your health care provider. Make sure you discuss any questions you have with your health care  provider.

## 2014-11-12 LAB — URINE CULTURE
COLONY COUNT: NO GROWTH
Culture: NO GROWTH

## 2014-11-12 LAB — CYTOLOGY - PAP

## 2015-01-01 ENCOUNTER — Emergency Department (HOSPITAL_COMMUNITY)
Admission: EM | Admit: 2015-01-01 | Discharge: 2015-01-02 | Disposition: A | Discharge status: Home / Self Care | Payer: Medicaid Other | Attending: Emergency Medicine | Admitting: Emergency Medicine

## 2015-01-01 ENCOUNTER — Encounter (HOSPITAL_COMMUNITY): Payer: Self-pay | Admitting: Emergency Medicine

## 2015-01-01 DIAGNOSIS — Z792 Long term (current) use of antibiotics: Secondary | ICD-10-CM | POA: Diagnosis not present

## 2015-01-01 DIAGNOSIS — Z87442 Personal history of urinary calculi: Secondary | ICD-10-CM | POA: Insufficient documentation

## 2015-01-01 DIAGNOSIS — F419 Anxiety disorder, unspecified: Secondary | ICD-10-CM | POA: Diagnosis not present

## 2015-01-01 DIAGNOSIS — M1990 Unspecified osteoarthritis, unspecified site: Secondary | ICD-10-CM | POA: Insufficient documentation

## 2015-01-01 DIAGNOSIS — Z8719 Personal history of other diseases of the digestive system: Secondary | ICD-10-CM | POA: Insufficient documentation

## 2015-01-01 DIAGNOSIS — Z720 Tobacco use: Secondary | ICD-10-CM | POA: Diagnosis not present

## 2015-01-01 DIAGNOSIS — N8320 Unspecified ovarian cysts: Secondary | ICD-10-CM | POA: Diagnosis not present

## 2015-01-01 DIAGNOSIS — R1031 Right lower quadrant pain: Secondary | ICD-10-CM

## 2015-01-01 DIAGNOSIS — F329 Major depressive disorder, single episode, unspecified: Secondary | ICD-10-CM | POA: Insufficient documentation

## 2015-01-01 DIAGNOSIS — E039 Hypothyroidism, unspecified: Secondary | ICD-10-CM | POA: Insufficient documentation

## 2015-01-01 DIAGNOSIS — G8929 Other chronic pain: Secondary | ICD-10-CM | POA: Insufficient documentation

## 2015-01-01 DIAGNOSIS — N83299 Other ovarian cyst, unspecified side: Secondary | ICD-10-CM

## 2015-01-01 DIAGNOSIS — Z79899 Other long term (current) drug therapy: Secondary | ICD-10-CM | POA: Insufficient documentation

## 2015-01-01 DIAGNOSIS — R109 Unspecified abdominal pain: Secondary | ICD-10-CM | POA: Diagnosis present

## 2015-01-01 MED ORDER — ONDANSETRON HCL 4 MG/2ML IJ SOLN: 4.0000 mg | Freq: Once | INTRAMUSCULAR | Status: DC

## 2015-01-01 MED ORDER — MORPHINE SULFATE 4 MG/ML IJ SOLN
4.0000 mg | Freq: Once | INTRAMUSCULAR | Status: AC
  Administered 2015-01-02: 4 mg via INTRAVENOUS
  Filled 2015-01-01: qty 1

## 2015-01-01 NOTE — ED Provider Notes (Signed)
CSN: [id number]     Arrival date & time 01/01/15  2203 History  This chart was scribed for [name], MD by Helane Gunther, ED Scribe. This patient was seen in room [facility]04[facility] and the patient's care was started at 11:29 PM.    Chief Complaint  Patient presents with  . Abdominal Pain   The history is provided by the patient. No language interpreter was used.   HPI Comments: Vanessa Bowen is a 39 y.o. female who presents to the Emergency Department complaining of right-sided, stabbing abdominal pain onset earlier this evening. She reports the pain radiates to her back. She rates the pain as an 8/10. She has taken ibuprofen at [facility] with mild relief. She reports an associated HA and nausea. She has a PMHx of kidney stones in 2011 and states that this feels somewhat similar, but not quite. She notes that she had lasagna for dinner, but states that the pain started before then. She has a PMHx of colitis. She takes daily thyroid medication and anxiety medication. She denies dysuria, vomiting, and diarrhea.  Past Medical History  Diagnosis Date  . Colitis, ulcerative   . Depression   . Anxiety   . Hypothyroidism   . Arthritis   . Chronic back pain    Past Surgical History  Procedure Laterality Date  . Knee surgery    . Ankle surgery    . Ovarian cyst removal    . Cesarean section    . Breast augmentation    . Laparoscopic bilateral salpingectomy  07/27/2012    Procedure: LAPAROSCOPIC BILATERAL SALPINGECTOMY;  Surgeon: Florian Buff, MD;  Location: [facility];  Service: Gynecology;  Laterality: N/A;   Family History  Problem Relation Age of Onset  . Cancer Maternal Grandmother   . Cancer Maternal Grandfather    History  Substance Use Topics  . Smoking status: Current Every Day Smoker -- 0.50 packs/day for 15 years    Types: Cigarettes  . Smokeless tobacco: Not on file  . Alcohol Use: Yes     Comment: occasionally   OB History    No data available     Review of  Systems  Constitutional: Negative for fever.  Respiratory: Negative for chest tightness and shortness of breath.   Cardiovascular: Negative for chest pain.  Gastrointestinal: Positive for nausea and abdominal pain. Negative for vomiting.  Genitourinary: Negative for dysuria, hematuria and vaginal discharge.  Musculoskeletal: Negative for back pain.  Neurological: Negative for headaches.  All other systems reviewed and are negative.   Allergies  Doxycycline and Sulfonamide derivatives  Home Medications   Prior to Admission medications   Medication Sig Start Date End Date Taking? Authorizing Provider  amitriptyline (ELAVIL) 50 MG tablet Take 100 mg by mouth at bedtime. 10/17/14   Historical Provider, MD  DULoxetine (CYMBALTA) 60 MG capsule Take 60 mg by mouth daily.    Historical Provider, MD  furosemide (LASIX) 20 MG tablet Take 20 mg by mouth daily as needed for fluid.     Historical Provider, MD  gabapentin (NEURONTIN) 100 MG capsule Take 200 mg by mouth at bedtime.  10/17/14   Historical Provider, MD  HYDROcodone-acetaminophen (NORCO/VICODIN) 5-325 MG per tablet Take 1 tablet by mouth every 6 (six) hours as needed for moderate pain. 01/02/15   [name], MD  levothyroxine (SYNTHROID, LEVOTHROID) 137 MCG tablet Take 137 mcg by mouth daily before breakfast.    Historical Provider, MD  loperamide (IMODIUM A-D) 2 MG tablet  Take 2 mg by mouth 4 (four) times daily as needed for diarrhea or loose stools.    Historical Provider, MD  naproxen sodium (ALEVE) 220 MG tablet Take 220 mg by mouth 2 (two) times daily as needed. Back or headache pain    Historical Provider, MD  ondansetron (ZOFRAN) 4 MG tablet Take 1 tablet (4 mg total) by mouth every 6 (six) hours. 11/10/14   Ezequiel Essex, MD  oxyCODONE-acetaminophen (PERCOCET) 7.5-325 MG per tablet Take 1-2 tablets by mouth every 6 (six) hours as needed for pain. Patient not taking: Reported on 11/08/2014 07/27/12   Florian Buff, MD  silver  sulfADIAZINE (SILVADENE) 1 % cream Use to area 2-3 times per day Patient taking differently: Apply 1 application topically 3 (three) times daily. Use to area 2-3 times per day 11/08/14   Florian Buff, MD  tiZANidine (ZANAFLEX) 4 MG tablet Take 1 tablet by mouth every 8 (eight) hours as needed. For spasms 10/31/14   Historical Provider, MD   BP 126/79 mmHg  Pulse 72  Temp(Src) 97.4 F (36.3 C) (Oral)  Resp 18  Ht 5' 3"  (1.6 m)  Wt 151 lb (68.493 kg)  BMI 26.76 kg/m2  SpO2 100%  LMP 12/28/2014 (Exact Date) Physical Exam  Constitutional: She is oriented to person, place, and time. She appears well-developed and well-nourished. No distress.  HENT:  Head: Normocephalic and atraumatic.  Cardiovascular: Normal rate, regular rhythm and normal heart sounds.   No murmur heard. Pulmonary/Chest: Effort normal and breath sounds normal. No respiratory distress. She has no wheezes.  Abdominal: Soft. Bowel sounds are normal. There is tenderness. There is guarding. There is no rebound.  Right lower quadrant tenderness to palpation, voluntary guarding  Neurological: She is alert and oriented to person, place, and time.  Skin: Skin is warm and dry.  Psychiatric: She has a normal mood and affect.  Nursing note and vitals reviewed.   ED Course  Procedures  DIAGNOSTIC STUDIES: Oxygen Saturation is 100% on RA, normal by my interpretation.    COORDINATION OF CARE: 11:33 PM - Discussed plans to order diagnostic studies, anti-nausea medication, and pain medication. Pt advised of plan for treatment and pt agrees.  Labs Review Labs Reviewed  CBC WITH DIFFERENTIAL/PLATELET - Abnormal; Notable for the following:    WBC 11.5 (*)    Platelets 411 (*)    Lymphs Abs 5.2 (*)    All other components within normal limits  COMPREHENSIVE METABOLIC PANEL - Abnormal; Notable for the following:    Glucose, Bld 102 (*)    BUN 24 (*)    All other components within normal limits  URINALYSIS, ROUTINE W REFLEX  MICROSCOPIC (NOT AT [facility]) - Abnormal; Notable for the following:    Specific Gravity, Urine >1.030 (*)    Hgb urine dipstick LARGE (*)    Bilirubin Urine SMALL (*)    All other components within normal limits  URINE MICROSCOPIC-ADD ON - Abnormal; Notable for the following:    Squamous Epithelial / LPF MANY (*)    Bacteria, UA MANY (*)    All other components within normal limits    Imaging Review Ct Abdomen Pelvis W Contrast  01/02/2015   CLINICAL DATA:  Initial evaluation for acute right lower quadrant abdominal pain.  EXAM: CT ABDOMEN AND PELVIS WITH CONTRAST  TECHNIQUE: Multidetector CT imaging of the abdomen and pelvis was performed using the standard protocol following bolus administration of intravenous contrast.  CONTRAST:  60m OMNIPAQUE IOHEXOL 300 MG/ML SOLN,  165m OMNIPAQUE IOHEXOL 300 MG/ML SOLN  COMPARISON:  Prior CT from 11/10/2014  FINDINGS: The visualized lung bases are clear. No pleural or pericardial effusion present. Bilateral breast implants are partially visualized.  Subcentimeter hypodensity within the posterior right hepatic lobe noted, too small the characterize, but may reflect a small cyst. Focal fat deposition noted adjacent to the fissure for ligamentum teres. Liver is otherwise unremarkable. Gallbladder within normal limits. No biliary dilatation. Spleen, adrenal glands, and pancreas demonstrate a normal contrast enhanced appearance.  Kidneys are equal size with symmetric enhancement. No nephrolithiasis, hydronephrosis, or focal enhancing renal mass.  Stomach within normal limits. No evidence for bowel obstruction. No abnormal wall thickening, mucosal enhancement, or inflammatory fat stranding seen about the bowels. Appendix is well visualized within the lower mid pelvis and is of normal caliber and appearance without associated inflammatory changes to suggest acute appendicitis. Moderate amount of retained stool within the cecum, ascending colon, and transverse colon.  Sigmoid colon is somewhat redundant with a loop located within the right lower quadrant.  Bladder within normal limits. Uterus and ovaries demonstrate no acute abnormality. 2.3 cm physiologic right ovarian cyst noted.  No free air or fluid. No pathologically enlarged intra-abdominal or pelvic lymph nodes identified. Normal intravascular enhancement seen throughout the intra-abdominal aorta and its branch vessels. Minimal atheromatous plaque present within the infrarenal aorta and about the aortic bifurcation. No aneurysm. Insular note made of a retro aortic left renal vein.  No acute osseous abnormality. No worrisome lytic or blastic osseous lesions.  IMPRESSION: 1. No CT evidence for acute intra-abdominal or pelvic process identified. 2. Normal appendix. 3. Moderate amount of retained stool within the ascending and transverse colon, suggesting constipation. 4. 2.3 cm physiologic right ovarian cyst.   Electronically Signed   By: BJeannine BogaM.D.   On: 01/02/2015 02:51     EKG Interpretation None      MDM   Final diagnoses:  RLQ abdominal pain  Physiological ovarian cysts    Patient presents with right lower quadrant pain. Tender on exam but no signs of peritonitis. She does have a history of kidney stones; however, would suspect that she would not be tender if she had kidney stones. Denies any urinary complaints. Status post tubal ligation. Other considerations include ovarian pathology or appendicitis.  Patient given pain and nausea medicine. Basic labwork obtained. Mild leukocytosis.  Given this and tenderness on exam, will obtain CT scan. CT negative for appendicitis. There is a large 2.3 cm physiologic ovarian cyst. While there does not appear to be any hemorrhagic component, CT is not the best test. Suspect this may be causing her some discomfort. She was able to tolerate fluids. Will have patient follow-up in 2 days with her primary physician if symptoms continue.  After history,  exam, and medical workup I feel the patient has been appropriately medically screened and is safe for discharge home. Pertinent diagnoses were discussed with the patient. Patient was given return precautions.  I personally performed the services described in this documentation, which was scribed in my presence. The recorded information has been reviewed and is accurate.   CMerryl Hacker MD 01/02/15 [phone]

## 2015-01-01 NOTE — ED Notes (Signed)
Pt. Reports right lower quadrant pain starting 1 hour PTA. Pt. Reports nausea but denies vomiting/diarrhea.

## 2015-01-02 ENCOUNTER — Emergency Department (HOSPITAL_COMMUNITY): Payer: Medicaid Other

## 2015-01-02 LAB — URINALYSIS, ROUTINE W REFLEX MICROSCOPIC
Glucose, UA: NEGATIVE mg/dL
Ketones, ur: NEGATIVE mg/dL
Leukocytes, UA: NEGATIVE
Nitrite: NEGATIVE
PH: 5.5 (ref 5.0–8.0)
Protein, ur: NEGATIVE mg/dL
Specific Gravity, Urine: >1.03 — ABNORMAL HIGH (ref 1.005–1.030)
UROBILINOGEN UA: 0.2 mg/dL (ref 0.0–1.0)

## 2015-01-02 LAB — CBC WITH DIFFERENTIAL/PLATELET
BASOS ABS: 0.1 10*3/uL (ref 0.0–0.1)
BASOS PCT: 1 % (ref 0–1)
EOS ABS: 0.3 10*3/uL (ref 0.0–0.7)
Eosinophils Relative: 2 % (ref 0–5)
HEMATOCRIT: 42.4 % (ref 36.0–46.0)
HEMOGLOBIN: 14.3 g/dL (ref 12.0–15.0)
LYMPHS ABS: 5.2 10*3/uL — AB (ref 0.7–4.0)
Lymphocytes Relative: 45 % (ref 12–46)
MCH: 30.9 pg (ref 26.0–34.0)
MCHC: 33.7 g/dL (ref 30.0–36.0)
MCV: 91.6 fL (ref 78.0–100.0)
MONOS PCT: 7 % (ref 3–12)
Monocytes Absolute: 0.8 10*3/uL (ref 0.1–1.0)
NEUTROS PCT: 45 % (ref 43–77)
Neutro Abs: 5.2 10*3/uL (ref 1.7–7.7)
PLATELETS: 411 10*3/uL — AB (ref 150–400)
RBC: 4.63 MIL/uL (ref 3.87–5.11)
RDW: 13.6 % (ref 11.5–15.5)
WBC: 11.5 10*3/uL — AB (ref 4.0–10.5)

## 2015-01-02 LAB — COMPREHENSIVE METABOLIC PANEL
ALT: 15 U/L (ref 14–54)
AST: 15 U/L (ref 15–41)
Albumin: 4.2 g/dL (ref 3.5–5.0)
Alkaline Phosphatase: 62 U/L (ref 38–126)
Anion gap: 9 (ref 5–15)
BUN: 24 mg/dL — AB (ref 6–20)
CALCIUM: 9.1 mg/dL (ref 8.9–10.3)
CHLORIDE: 105 mmol/L (ref 101–111)
CO2: 26 mmol/L (ref 22–32)
CREATININE: 0.93 mg/dL (ref 0.44–1.00)
GFR calc Af Amer: >60 mL/min (ref >60)
GLUCOSE: 102 mg/dL — AB (ref 65–99)
Potassium: 3.7 mmol/L (ref 3.5–5.1)
SODIUM: 140 mmol/L (ref 135–145)
Total Bilirubin: 0.5 mg/dL (ref 0.3–1.2)
Total Protein: 7.7 g/dL (ref 6.5–8.1)

## 2015-01-02 LAB — URINE MICROSCOPIC-ADD ON

## 2015-01-02 MED ORDER — HYDROMORPHONE HCL 1 MG/ML IJ SOLN
1.0000 mg | Freq: Once | INTRAMUSCULAR | Status: AC
  Administered 2015-01-02: 1 mg via INTRAVENOUS
  Filled 2015-01-02: qty 1

## 2015-01-02 MED ORDER — HYDROCODONE-ACETAMINOPHEN 5-325 MG PO TABS
1.0000 | ORAL_TABLET | Freq: Once | ORAL | Status: AC
  Administered 2015-01-02: 1 via ORAL
  Filled 2015-01-02: qty 1

## 2015-01-02 MED ORDER — HYDROCODONE-ACETAMINOPHEN 5-325 MG PO TABS: 1.0000 | ORAL_TABLET | Freq: Four times a day (QID) | ORAL | Status: DC | PRN

## 2015-01-02 MED ORDER — ONDANSETRON HCL 4 MG/2ML IJ SOLN
INTRAMUSCULAR | Status: AC
  Filled 2015-01-02: qty 2

## 2015-01-02 MED ORDER — IOHEXOL 300 MG/ML  SOLN
50.0000 mL | Freq: Once | INTRAMUSCULAR | Status: AC | PRN
  Administered 2015-01-02: 50 mL via ORAL

## 2015-01-02 MED ORDER — ONDANSETRON HCL 4 MG/2ML IJ SOLN
4.0000 mg | Freq: Once | INTRAMUSCULAR | Status: AC
  Administered 2015-01-02: 4 mg via INTRAVENOUS

## 2015-01-02 MED ORDER — IOHEXOL 300 MG/ML  SOLN
100.0000 mL | Freq: Once | INTRAMUSCULAR | Status: AC | PRN
  Administered 2015-01-02: 100 mL via INTRAVENOUS

## 2015-01-02 NOTE — ED Notes (Signed)
Pt informed of need for urine specimen, states she does not need to go at this time

## 2015-01-02 NOTE — ED Notes (Signed)
Pt c/o feeling nauseous, Dr. Dina Rich informed and orders given and carried out.

## 2015-01-02 NOTE — Discharge Instructions (Signed)
You were seen today for right lower quadrant pain. Your CT scan was negative for appendicitis. No evidence of kidney stones. You do have a large physiologic cyst which is normal during a menstrual cycle. This could be causing you some discomfort. You'll be discharged with a short course of pain medication. You should also take ibuprofen every 6 hours as needed for pain. Follow-up with your primary physician in 2 days if pain persists.  Ovarian Cyst An ovarian cyst is a fluid-filled sac that forms on an ovary. The ovaries are small organs that produce eggs in women. Various types of cysts can form on the ovaries. Most are not cancerous. Many do not cause problems, and they often go away on their own. Some may cause symptoms and require treatment. Common types of ovarian cysts include:  Functional cysts--These cysts may occur every month during the menstrual cycle. This is normal. The cysts usually go away with the next menstrual cycle if the woman does not get pregnant. Usually, there are no symptoms with a functional cyst.  Endometrioma cysts--These cysts form from the tissue that lines the uterus. They are also called "chocolate cysts" because they become filled with blood that turns brown. This type of cyst can cause pain in the lower abdomen during intercourse and with your menstrual period.  Cystadenoma cysts--This type develops from the cells on the outside of the ovary. These cysts can get very big and cause lower abdomen pain and pain with intercourse. This type of cyst can twist on itself, cut off its blood supply, and cause severe pain. It can also easily rupture and cause a lot of pain.  Dermoid cysts--This type of cyst is sometimes found in both ovaries. These cysts may contain different kinds of body tissue, such as skin, teeth, hair, or cartilage. They usually do not cause symptoms unless they get very big.  Theca lutein cysts--These cysts occur when too much of a certain hormone (human  chorionic gonadotropin) is produced and overstimulates the ovaries to produce an egg. This is most common after procedures used to assist with the conception of a baby (in vitro fertilization). CAUSES   Fertility drugs can cause a condition in which multiple large cysts are formed on the ovaries. This is called ovarian hyperstimulation syndrome.  A condition called polycystic ovary syndrome can cause hormonal imbalances that can lead to nonfunctional ovarian cysts. SIGNS AND SYMPTOMS  Many ovarian cysts do not cause symptoms. If symptoms are present, they may include:  Pelvic pain or pressure.  Pain in the lower abdomen.  Pain during sexual intercourse.  Increasing girth (swelling) of the abdomen.  Abnormal menstrual periods.  Increasing pain with menstrual periods.  Stopping having menstrual periods without being pregnant. DIAGNOSIS  These cysts are commonly found during a routine or annual pelvic exam. Tests may be ordered to find out more about the cyst. These tests may include:  Ultrasound.  X-ray of the pelvis.  CT scan.  MRI.  Blood tests. TREATMENT  Many ovarian cysts go away on their own without treatment. Your health care provider may want to check your cyst regularly for 2-3 months to see if it changes. For women in menopause, it is particularly important to monitor a cyst closely because of the higher rate of ovarian cancer in menopausal women. When treatment is needed, it may include any of the following:  A procedure to drain the cyst (aspiration). This may be done using a long needle and ultrasound. It can also be  done through a laparoscopic procedure. This involves using a thin, lighted tube with a tiny camera on the end (laparoscope) inserted through a small incision.  Surgery to remove the whole cyst. This may be done using laparoscopic surgery or an open surgery involving a larger incision in the lower abdomen.  Hormone treatment or birth control pills.  These methods are sometimes used to help dissolve a cyst. HOME CARE INSTRUCTIONS   Only take over-the-counter or prescription medicines as directed by your health care provider.  Follow up with your health care provider as directed.  Get regular pelvic exams and Pap tests. SEEK MEDICAL CARE IF:   Your periods are late, irregular, or painful, or they stop.  Your pelvic pain or abdominal pain does not go away.  Your abdomen becomes larger or swollen.  You have pressure on your bladder or trouble emptying your bladder completely.  You have pain during sexual intercourse.  You have feelings of fullness, pressure, or discomfort in your stomach.  You lose weight for no apparent reason.  You feel generally ill.  You become constipated.  You lose your appetite.  You develop acne.  You have an increase in body and facial hair.  You are gaining weight, without changing your exercise and eating habits.  You think you are pregnant. SEEK IMMEDIATE MEDICAL CARE IF:   You have increasing abdominal pain.  You feel sick to your stomach (nauseous), and you throw up (vomit).  You develop a fever that comes on suddenly.  You have abdominal pain during a bowel movement.  Your menstrual periods become heavier than usual. MAKE SURE YOU:  Understand these instructions.  Will watch your condition.  Will get help right away if you are not doing well or get worse. Document Released: 06/08/2005 Document Revised: 06/13/2013 Document Reviewed: 02/13/2013 [facility] Patient Information 2015 [place], [place]. This information is not intended to replace advice given to you by your health care provider. Make sure you discuss any questions you have with your health care provider.

## 2015-01-02 NOTE — ED Notes (Signed)
Pt given ginger ale to drink and asking for something for pain; Dr. Dina Rich informed and orders given for Norco;

## 2015-07-25 ENCOUNTER — Ambulatory Visit: Payer: Medicaid Other | Admitting: Orthopaedic Surgery

## 2015-08-09 ENCOUNTER — Ambulatory Visit: Payer: Medicaid Other | Admitting: "Endocrinology

## 2015-10-10 ENCOUNTER — Encounter (HOSPITAL_COMMUNITY): Payer: Self-pay | Admitting: Cardiology

## 2015-10-10 ENCOUNTER — Emergency Department (HOSPITAL_COMMUNITY): Payer: Self-pay

## 2015-10-10 ENCOUNTER — Emergency Department (HOSPITAL_COMMUNITY)
Admission: EM | Admit: 2015-10-10 | Discharge: 2015-10-10 | Disposition: A | Discharge status: Home / Self Care | Payer: Self-pay | Attending: Emergency Medicine | Admitting: Emergency Medicine

## 2015-10-10 DIAGNOSIS — R1031 Right lower quadrant pain: Secondary | ICD-10-CM

## 2015-10-10 DIAGNOSIS — R300 Dysuria: Secondary | ICD-10-CM

## 2015-10-10 DIAGNOSIS — F17210 Nicotine dependence, cigarettes, uncomplicated: Secondary | ICD-10-CM | POA: Insufficient documentation

## 2015-10-10 DIAGNOSIS — R109 Unspecified abdominal pain: Secondary | ICD-10-CM

## 2015-10-10 DIAGNOSIS — Z79899 Other long term (current) drug therapy: Secondary | ICD-10-CM | POA: Insufficient documentation

## 2015-10-10 DIAGNOSIS — E039 Hypothyroidism, unspecified: Secondary | ICD-10-CM | POA: Insufficient documentation

## 2015-10-10 DIAGNOSIS — F329 Major depressive disorder, single episode, unspecified: Secondary | ICD-10-CM | POA: Insufficient documentation

## 2015-10-10 DIAGNOSIS — R112 Nausea with vomiting, unspecified: Secondary | ICD-10-CM | POA: Insufficient documentation

## 2015-10-10 LAB — CBC WITH DIFFERENTIAL/PLATELET
Basophils Absolute: 0.1 10*3/uL (ref 0.0–0.1)
Basophils Relative: 1 %
Eosinophils Absolute: 0.1 10*3/uL (ref 0.0–0.7)
Eosinophils Relative: 1 %
HEMATOCRIT: 40 % (ref 36.0–46.0)
Hemoglobin: 13.8 g/dL (ref 12.0–15.0)
LYMPHS ABS: 3.5 10*3/uL (ref 0.7–4.0)
Lymphocytes Relative: 30 %
MCH: 31.6 pg (ref 26.0–34.0)
MCHC: 34.5 g/dL (ref 30.0–36.0)
MCV: 91.5 fL (ref 78.0–100.0)
MONOS PCT: 4 %
Monocytes Absolute: 0.4 10*3/uL (ref 0.1–1.0)
NEUTROS ABS: 7.5 10*3/uL (ref 1.7–7.7)
NEUTROS PCT: 64 %
Platelets: 335 10*3/uL (ref 150–400)
RBC: 4.37 MIL/uL (ref 3.87–5.11)
RDW: 13 % (ref 11.5–15.5)
WBC: 11.6 10*3/uL — AB (ref 4.0–10.5)

## 2015-10-10 LAB — URINALYSIS, ROUTINE W REFLEX MICROSCOPIC
BILIRUBIN URINE: NEGATIVE
BILIRUBIN URINE: NEGATIVE
GLUCOSE, UA: NEGATIVE mg/dL
GLUCOSE, UA: NEGATIVE mg/dL
KETONES UR: NEGATIVE mg/dL
KETONES UR: NEGATIVE mg/dL
Nitrite: NEGATIVE
Nitrite: NEGATIVE
PH: 5.5 (ref 5.0–8.0)
PH: 5.5 (ref 5.0–8.0)
Protein, ur: NEGATIVE mg/dL
Protein, ur: NEGATIVE mg/dL
Specific Gravity, Urine: <1.005 — ABNORMAL LOW (ref 1.005–1.030)
Specific Gravity, Urine: <1.005 — ABNORMAL LOW (ref 1.005–1.030)

## 2015-10-10 LAB — URINE MICROSCOPIC-ADD ON
Bacteria, UA: NONE SEEN
WBC, UA: NONE SEEN WBC/hpf (ref 0–5)

## 2015-10-10 LAB — COMPREHENSIVE METABOLIC PANEL
ALBUMIN: 4.3 g/dL (ref 3.5–5.0)
ALT: 15 U/L (ref 14–54)
ANION GAP: 7 (ref 5–15)
AST: 14 U/L — ABNORMAL LOW (ref 15–41)
Alkaline Phosphatase: 50 U/L (ref 38–126)
BILIRUBIN TOTAL: 0.4 mg/dL (ref 0.3–1.2)
BUN: 12 mg/dL (ref 6–20)
CALCIUM: 9.4 mg/dL (ref 8.9–10.3)
CO2: 26 mmol/L (ref 22–32)
CREATININE: 0.89 mg/dL (ref 0.44–1.00)
Chloride: 104 mmol/L (ref 101–111)
GFR calc Af Amer: >60 mL/min (ref >60)
GFR calc non Af Amer: >60 mL/min (ref >60)
Glucose, Bld: 99 mg/dL (ref 65–99)
Potassium: 4.7 mmol/L (ref 3.5–5.1)
Sodium: 137 mmol/L (ref 135–145)
TOTAL PROTEIN: 7.3 g/dL (ref 6.5–8.1)

## 2015-10-10 MED ORDER — IOPAMIDOL (ISOVUE-300) INJECTION 61%
100.0000 mL | Freq: Once | INTRAVENOUS | Status: AC | PRN
  Administered 2015-10-10: 100 mL via INTRAVENOUS

## 2015-10-10 MED ORDER — HYDROMORPHONE HCL 1 MG/ML IJ SOLN
1.0000 mg | Freq: Once | INTRAMUSCULAR | Status: AC
  Administered 2015-10-10: 1 mg via INTRAVENOUS
  Filled 2015-10-10: qty 1

## 2015-10-10 MED ORDER — ONDANSETRON HCL 4 MG/2ML IJ SOLN
4.0000 mg | Freq: Once | INTRAMUSCULAR | Status: AC
  Administered 2015-10-10: 4 mg via INTRAVENOUS

## 2015-10-10 MED ORDER — ONDANSETRON HCL 4 MG/2ML IJ SOLN
4.0000 mg | Freq: Once | INTRAMUSCULAR | Status: DC
  Filled 2015-10-10: qty 2

## 2015-10-10 MED ORDER — PHENAZOPYRIDINE HCL 200 MG PO TABS: 200.0000 mg | ORAL_TABLET | Freq: Three times a day (TID) | ORAL | Status: DC

## 2015-10-10 MED ORDER — HYDROCODONE-ACETAMINOPHEN 5-325 MG PO TABS: 2.0000 | ORAL_TABLET | ORAL | Status: DC | PRN

## 2015-10-10 MED ORDER — DIATRIZOATE MEGLUMINE & SODIUM 66-10 % PO SOLN
ORAL | Status: AC
  Filled 2015-10-10: qty 30

## 2015-10-10 NOTE — Discharge Instructions (Signed)
Abdominal Pain, Adult Many things can cause abdominal pain. Usually, abdominal pain is not caused by a disease and will improve without treatment. It can often be observed and treated at home. Your health care provider will do a physical exam and possibly order blood tests and X-rays to help determine the seriousness of your pain. However, in many cases, more time must pass before a clear cause of the pain can be found. Before that point, your health care provider may not know if you need more testing or further treatment. HOME CARE INSTRUCTIONS Monitor your abdominal pain for any changes. The following actions may help to alleviate any discomfort you are experiencing:  Only take over-the-counter or prescription medicines as directed by your health care provider.  Do not take laxatives unless directed to do so by your health care provider.  Try a clear liquid diet (broth, tea, or water) as directed by your health care provider. Slowly move to a bland diet as tolerated. SEEK MEDICAL CARE IF:  You have unexplained abdominal pain.  You have abdominal pain associated with nausea or diarrhea.  You have pain when you urinate or have a bowel movement.  You experience abdominal pain that wakes you in the night.  You have abdominal pain that is worsened or improved by eating food.  You have abdominal pain that is worsened with eating fatty foods.  You have a fever. SEEK IMMEDIATE MEDICAL CARE IF:  Your pain does not go away within 2 hours.  You keep throwing up (vomiting).  Your pain is felt only in portions of the abdomen, such as the right side or the left lower portion of the abdomen.  You pass bloody or black tarry stools. MAKE SURE YOU:  Understand these instructions.  Will watch your condition.  Will get help right away if you are not doing well or get worse.   This information is not intended to replace advice given to you by your health care provider. Make sure you discuss  any questions you have with your health care provider.   Document Released: 03/18/2005 Document Revised: 02/27/2015 Document Reviewed: 02/15/2013 Elsevier Interactive Patient Education 2016 [employer]  Dysuria Dysuria is pain or discomfort while urinating. The pain or discomfort may be felt in the tube that carries urine out of the bladder (urethra) or in the surrounding tissue of the genitals. The pain may also be felt in the groin area, lower abdomen, and lower back. You may have to urinate frequently or have the sudden feeling that you have to urinate (urgency). Dysuria can affect both men and women, but is more common in women. Dysuria can be caused by many different things, including:  Urinary tract infection in women.  Infection of the kidney or bladder.  Kidney stones or bladder stones.  Certain sexually transmitted infections (STIs), such as chlamydia.  Dehydration.  Inflammation of the vagina.  Use of certain medicines.  Use of certain soaps or scented products that cause irritation. HOME CARE INSTRUCTIONS Watch your dysuria for any changes. The following actions may help to reduce any discomfort you are feeling:  Drink enough fluid to keep your urine clear or pale yellow.  Empty your bladder often. Avoid holding urine for long periods of time.  After a bowel movement or urination, women should cleanse from front to back, using each tissue only once.  Empty your bladder after sexual intercourse.  Take medicines only as directed by your health care provider.  If you were prescribed an  antibiotic medicine, finish it all even if you start to feel better.  Avoid caffeine, tea, and alcohol. They can irritate the bladder and make dysuria worse. In men, alcohol may irritate the prostate.  Keep all follow-up visits as directed by your health care provider. This is important.  If you had any tests done to find the cause of dysuria, it is your responsibility to obtain  your test results. Ask the lab or department performing the test when and how you will get your results. Talk with your health care provider if you have any questions about your results. SEEK MEDICAL CARE IF:  You develop pain in your back or sides.  You have a fever.  You have nausea or vomiting.  You have blood in your urine.  You are not urinating as often as you usually do. SEEK IMMEDIATE MEDICAL CARE IF:  You pain is severe and not relieved with medicines.  You are unable to hold down any fluids.  You or someone else notices a change in your mental function.  You have a rapid heartbeat at rest.  You have shaking or chills.  You feel extremely weak.   This information is not intended to replace advice given to you by your health care provider. Make sure you discuss any questions you have with your health care provider.   Document Released: 03/06/2004 Document Revised: 06/29/2014 Document Reviewed: 02/01/2014 Elsevier Interactive Patient Education [employer].

## 2015-10-10 NOTE — ED Notes (Signed)
Frequent, painful urinating times 2 weeks.  C/o lower back pain and nausea.

## 2015-10-10 NOTE — ED Provider Notes (Signed)
CSN: [id number]     Arrival date & time 10/10/15  6295 History   First MD Initiated Contact with Patient 10/10/15 [phone]     Chief Complaint  Patient presents with  . Dysuria     (Consider location/radiation/quality/duration/timing/severity/associated sxs/prior Treatment) Patient is a 40 y.o. female presenting with dysuria. The history is provided by the patient. No language interpreter was used.  Dysuria Pain quality:  Aching and burning Pain severity:  Moderate Onset quality:  Gradual Duration:  2 weeks Timing:  Constant Progression:  Worsening Chronicity:  New Recent urinary tract infections: yes   Relieved by:  Nothing Associated symptoms: abdominal pain, nausea and vomiting   Associated symptoms: no genital lesions and no vaginal discharge   Risk factors: hx of urolithiasis and recurrent urinary tract infections   Pt complains of pain in her lower abdomen that radiates to her low back on the right side.  Pt has had ovarain cyst in the past  Past Medical History  Diagnosis Date  . Colitis, ulcerative ([place])   . Depression   . Anxiety   . Hypothyroidism   . Arthritis   . Chronic back pain    Past Surgical History  Procedure Laterality Date  . Knee surgery    . Ankle surgery    . Ovarian cyst removal    . Cesarean section    . Breast augmentation    . Laparoscopic bilateral salpingectomy  07/27/2012    Procedure: LAPAROSCOPIC BILATERAL SALPINGECTOMY;  Surgeon: Florian Buff, MD;  Location: [facility];  Service: Gynecology;  Laterality: N/A;   Family History  Problem Relation Age of Onset  . Cancer Maternal Grandmother   . Cancer Maternal Grandfather    Social History  Substance Use Topics  . Smoking status: Current Every Day Smoker -- 0.50 packs/day for 15 years    Types: Cigarettes  . Smokeless tobacco: None  . Alcohol Use: Yes     Comment: occasionally   OB History    No data available     Review of Systems  Gastrointestinal: Positive for nausea, vomiting and  abdominal pain.  Genitourinary: Positive for dysuria. Negative for vaginal discharge.  All other systems reviewed and are negative.     Allergies  Doxycycline and Sulfonamide derivatives  Home Medications   Prior to Admission medications   Medication Sig Start Date End Date Taking? Authorizing Provider  amitriptyline (ELAVIL) 50 MG tablet Take 100 mg by mouth at bedtime. 10/17/14   Historical Provider, MD  DULoxetine (CYMBALTA) 60 MG capsule Take 60 mg by mouth daily.    Historical Provider, MD  furosemide (LASIX) 20 MG tablet Take 20 mg by mouth daily as needed for fluid.     Historical Provider, MD  gabapentin (NEURONTIN) 100 MG capsule Take 200 mg by mouth at bedtime.  10/17/14   Historical Provider, MD  HYDROcodone-acetaminophen (NORCO/VICODIN) 5-325 MG per tablet Take 1 tablet by mouth every 6 (six) hours as needed for moderate pain. 01/02/15   [name], MD  levothyroxine (SYNTHROID, LEVOTHROID) 137 MCG tablet Take 137 mcg by mouth daily before breakfast.    Historical Provider, MD  loperamide (IMODIUM A-D) 2 MG tablet Take 2 mg by mouth 4 (four) times daily as needed for diarrhea or loose stools.    Historical Provider, MD  naproxen sodium (ALEVE) 220 MG tablet Take 220 mg by mouth 2 (two) times daily as needed. Back or headache pain    Historical Provider, MD  ondansetron (ZOFRAN) 4 MG  tablet Take 1 tablet (4 mg total) by mouth every 6 (six) hours. 11/10/14   Ezequiel Essex, MD  oxyCODONE-acetaminophen (PERCOCET) 7.5-325 MG per tablet Take 1-2 tablets by mouth every 6 (six) hours as needed for pain. Patient not taking: Reported on 11/08/2014 07/27/12   Florian Buff, MD  silver sulfADIAZINE (SILVADENE) 1 % cream Use to area 2-3 times per day Patient taking differently: Apply 1 application topically 3 (three) times daily. Use to area 2-3 times per day 11/08/14   Florian Buff, MD  tiZANidine (ZANAFLEX) 4 MG tablet Take 1 tablet by mouth every 8 (eight) hours as needed. For spasms  10/31/14   Historical Provider, MD   BP 109/77 mmHg  Pulse 89  Temp(Src) 98.1 F (36.7 C) (Oral)  Resp 18  Ht 5' 3"  (1.6 m)  Wt 74.844 kg  BMI 29.24 kg/m2  SpO2 99%  LMP 09/13/2015 Physical Exam  Constitutional: She is oriented to person, place, and time. She appears well-developed and well-nourished.  HENT:  Head: Normocephalic and atraumatic.  Eyes: Conjunctivae and EOM are normal. Pupils are equal, round, and reactive to light.  Neck: Normal range of motion.  Cardiovascular: Normal rate and normal heart sounds.   Pulmonary/Chest: Effort normal.  Abdominal: Soft. She exhibits no distension. There is tenderness.  Musculoskeletal: Normal range of motion.  Neurological: She is alert and oriented to person, place, and time.  Skin: Skin is warm.  Psychiatric: She has a normal mood and affect.  Nursing note and vitals reviewed.   ED Course  Procedures (including critical care time) Labs Review Labs Reviewed  URINALYSIS, ROUTINE W REFLEX MICROSCOPIC (NOT AT [facility]) - Abnormal; Notable for the following:    Specific Gravity, Urine <1.005 (*)    Hgb urine dipstick SMALL (*)    Leukocytes, UA TRACE (*)    All other components within normal limits  URINE MICROSCOPIC-ADD ON - Abnormal; Notable for the following:    Squamous Epithelial / LPF TOO NUMEROUS TO COUNT (*)    Bacteria, UA RARE (*)    All other components within normal limits  CBC WITH DIFFERENTIAL/PLATELET - Abnormal; Notable for the following:    WBC 11.6 (*)    All other components within normal limits  COMPREHENSIVE METABOLIC PANEL - Abnormal; Notable for the following:    AST 14 (*)    All other components within normal limits  URINALYSIS, ROUTINE W REFLEX MICROSCOPIC (NOT AT [facility]) - Abnormal; Notable for the following:    Specific Gravity, Urine <1.005 (*)    Hgb urine dipstick TRACE (*)    Leukocytes, UA TRACE (*)    All other components within normal limits  URINE MICROSCOPIC-ADD ON - Abnormal; Notable for the  following:    Squamous Epithelial / LPF 0-5 (*)    All other components within normal limits    Imaging Review No results found. I have personally reviewed and evaluated these images and lab results as part of my medical decision-making.   EKG Interpretation None      MDM initial urine was contaiminated with squamous cells,  Repeat ua  No sign of infection.  Pt complains of increased pain, given dilaudid and zofran with some relief.   ULtrasound show no evidence of torsion or kidney stone.   Pt has elevated wbc count.  I discussed with Dr. Roderic Palau Ct obtained and is normal.   I advised pt to follow up with her MD.   I will give her pyridum for burning and  hydrocodone for pain Culture pending   Final diagnoses:  Right lower quadrant abdominal pain    An After Visit Summary was printed and given to the patient. Meds ordered this encounter  Medications  . clonazePAM (KLONOPIN) 0.5 MG tablet    Sig: TK 1 T PO BID    Refill:  1  . HYDROmorphone (DILAUDID) injection 1 mg    Sig:   . DISCONTD: ondansetron (ZOFRAN) injection 4 mg    Sig:   . DISCONTD: diatrizoate meglumine-sodium (GASTROGRAFIN) 66-10 % solution    Sig:     Gaetana Michaelis   : cabinet override  . ondansetron (ZOFRAN) injection 4 mg    Sig:   . iopamidol (ISOVUE-300) 61 % injection 100 mL    Sig:   . HYDROcodone-acetaminophen (NORCO/VICODIN) 5-325 MG tablet    Sig: Take 2 tablets by mouth every 4 (four) hours as needed.    Dispense:  20 tablet    Refill:  0    Order Specific Question:  Supervising Provider    Answer:  MILLER, BRIAN [[id number]]  . phenazopyridine (PYRIDIUM) 200 MG tablet    Sig: Take 1 tablet (200 mg total) by mouth 3 (three) times daily.    Dispense:  6 tablet    Refill:  0    Order Specific Question:  Supervising Provider    Answer:  Noemi Chapel [place], PA-C 10/11/15 [place], MD 10/11/15 [phone]

## 2015-12-02 ENCOUNTER — Other Ambulatory Visit: Payer: Self-pay | Admitting: "Endocrinology

## 2015-12-26 ENCOUNTER — Telehealth: Payer: Self-pay | Admitting: Obstetrics & Gynecology

## 2015-12-26 MED ORDER — SILVER SULFADIAZINE 1 % EX CREA: TOPICAL_CREAM | CUTANEOUS | Status: DC

## 2015-12-26 NOTE — Telephone Encounter (Signed)
Spoke with pt. Pt needs a refill on Silvadene cream. Pt states she has an appt to see you on 7/20. Thanks!! [place]

## 2015-12-27 NOTE — Telephone Encounter (Signed)
Pt aware Silvadene was sent to pharmacy. [place]

## 2016-01-07 ENCOUNTER — Emergency Department (HOSPITAL_COMMUNITY): Payer: Self-pay

## 2016-01-07 ENCOUNTER — Emergency Department (HOSPITAL_COMMUNITY)
Admission: EM | Admit: 2016-01-07 | Discharge: 2016-01-07 | Disposition: A | Discharge status: Home / Self Care | Payer: Self-pay | Attending: Emergency Medicine | Admitting: Emergency Medicine

## 2016-01-07 ENCOUNTER — Encounter (HOSPITAL_COMMUNITY): Payer: Self-pay

## 2016-01-07 DIAGNOSIS — Z791 Long term (current) use of non-steroidal anti-inflammatories (NSAID): Secondary | ICD-10-CM | POA: Insufficient documentation

## 2016-01-07 DIAGNOSIS — M1990 Unspecified osteoarthritis, unspecified site: Secondary | ICD-10-CM | POA: Insufficient documentation

## 2016-01-07 DIAGNOSIS — X501XXA Overexertion from prolonged static or awkward postures, initial encounter: Secondary | ICD-10-CM | POA: Insufficient documentation

## 2016-01-07 DIAGNOSIS — Z79899 Other long term (current) drug therapy: Secondary | ICD-10-CM | POA: Insufficient documentation

## 2016-01-07 DIAGNOSIS — F17210 Nicotine dependence, cigarettes, uncomplicated: Secondary | ICD-10-CM | POA: Insufficient documentation

## 2016-01-07 DIAGNOSIS — E039 Hypothyroidism, unspecified: Secondary | ICD-10-CM | POA: Insufficient documentation

## 2016-01-07 DIAGNOSIS — Y939 Activity, unspecified: Secondary | ICD-10-CM | POA: Insufficient documentation

## 2016-01-07 DIAGNOSIS — Y999 Unspecified external cause status: Secondary | ICD-10-CM | POA: Insufficient documentation

## 2016-01-07 DIAGNOSIS — S93401A Sprain of unspecified ligament of right ankle, initial encounter: Secondary | ICD-10-CM | POA: Insufficient documentation

## 2016-01-07 DIAGNOSIS — Y929 Unspecified place or not applicable: Secondary | ICD-10-CM | POA: Insufficient documentation

## 2016-01-07 DIAGNOSIS — F329 Major depressive disorder, single episode, unspecified: Secondary | ICD-10-CM | POA: Insufficient documentation

## 2016-01-07 MED ORDER — OXYCODONE-ACETAMINOPHEN 5-325 MG PO TABS
1.0000 | ORAL_TABLET | Freq: Once | ORAL | Status: AC
  Administered 2016-01-07: 1 via ORAL
  Filled 2016-01-07: qty 1

## 2016-01-07 MED ORDER — HYDROCODONE-ACETAMINOPHEN 5-325 MG PO TABS: 1.0000 | ORAL_TABLET | ORAL | Status: DC | PRN

## 2016-01-07 MED ORDER — NAPROXEN 375 MG PO TABS: 375.0000 mg | ORAL_TABLET | Freq: Two times a day (BID) | ORAL | Status: DC | PRN

## 2016-01-07 NOTE — ED Notes (Signed)
EDP in with patient

## 2016-01-07 NOTE — Discharge Instructions (Signed)
You were evaluated today for ankle pain. Fortunately, XRays showed no acute fracture. I suspect your pain is due to a lateral ankle sprain, which should improve with time. Use your brace and crutches for the next week as needed. It is important that you try to move and put weight on your ankle as soon as you are able, as this will improve healing and range of motion. Follow-up with your doctor in 1 week if symptoms do not improve. Ankle Sprain An ankle sprain is an injury to the strong, fibrous tissues (ligaments) that hold the bones of your ankle joint together.  CAUSES An ankle sprain is usually caused by a fall or by twisting your ankle. Ankle sprains most commonly occur when you step on the outer edge of your foot, and your ankle turns inward. People who participate in sports are more prone to these types of injuries.  SYMPTOMS   Pain in your ankle. The pain may be present at rest or only when you are trying to stand or walk.  Swelling.  Bruising. Bruising may develop immediately or within 1 to 2 days after your injury.  Difficulty standing or walking, particularly when turning corners or changing directions. DIAGNOSIS  Your caregiver will ask you details about your injury and perform a physical exam of your ankle to determine if you have an ankle sprain. During the physical exam, your caregiver will press on and apply pressure to specific areas of your foot and ankle. Your caregiver will try to move your ankle in certain ways. An X-ray exam may be done to be sure a bone was not broken or a ligament did not separate from one of the bones in your ankle (avulsion fracture).  TREATMENT  Certain types of braces can help stabilize your ankle. Your caregiver can make a recommendation for this. Your caregiver may recommend the use of medicine for pain. If your sprain is severe, your caregiver may refer you to a surgeon who helps to restore function to parts of your skeletal system (orthopedist) or a  physical therapist. [place] ice to your injury for 1-2 days or as directed by your caregiver. Applying ice helps to reduce inflammation and pain.  Put ice in a plastic bag.  Place a towel between your skin and the bag.  Leave the ice on for 15-20 minutes at a time, every 2 hours while you are awake.  Only take over-the-counter or prescription medicines for pain, discomfort, or fever as directed by your caregiver.  Elevate your injured ankle above the level of your heart as much as possible for 2-3 days.  If your caregiver recommends crutches, use them as instructed. Gradually put weight on the affected ankle. Continue to use crutches or a cane until you can walk without feeling pain in your ankle.  If you have a plaster splint, wear the splint as directed by your caregiver. Do not rest it on anything harder than a pillow for the first 24 hours. Do not put weight on it. Do not get it wet. You may take it off to take a shower or bath.  You may have been given an elastic bandage to wear around your ankle to provide support. If the elastic bandage is too tight (you have numbness or tingling in your foot or your foot becomes cold and blue), adjust the bandage to make it comfortable.  If you have an air splint, you may blow more air into it or let  air out to make it more comfortable. You may take your splint off at night and before taking a shower or bath. Wiggle your toes in the splint several times per day to decrease swelling. SEEK MEDICAL CARE IF:   You have rapidly increasing bruising or swelling.  Your toes feel extremely cold or you lose feeling in your foot.  Your pain is not relieved with medicine. SEEK IMMEDIATE MEDICAL CARE IF:  Your toes are numb or blue.  You have severe pain that is increasing. MAKE SURE YOU:   Understand these instructions.  Will watch your condition.  Will get help right away if you are not doing well or get worse.   This  information is not intended to replace advice given to you by your health care provider. Make sure you discuss any questions you have with your health care provider.   Document Released: 06/08/2005 Document Revised: 06/29/2014 Document Reviewed: 06/20/2011 Elsevier Interactive Patient Education 2016 [employer]  Acute Ankle Sprain With Phase I Rehab An acute ankle sprain is a partial or complete tear in one or more of the ligaments of the ankle due to traumatic injury. The severity of the injury depends on both the number of ligaments sprained and the grade of sprain. There are 3 grades of sprains.   A grade 1 sprain is a mild sprain. There is a slight pull without obvious tearing. There is no loss of strength, and the muscle and ligament are the correct length.  A grade 2 sprain is a moderate sprain. There is tearing of fibers within the substance of the ligament where it connects two bones or two cartilages. The length of the ligament is increased, and there is usually decreased strength.  A grade 3 sprain is a complete rupture of the ligament and is uncommon. In addition to the grade of sprain, there are three types of ankle sprains.  Lateral ankle sprains: This is a sprain of one or more of the three ligaments on the outer side (lateral) of the ankle. These are the most common sprains. Medial ankle sprains: There is one large triangular ligament of the inner side (medial) of the ankle that is susceptible to injury. Medial ankle sprains are less common. Syndesmosis, "high ankle," sprains: The syndesmosis is the ligament that connects the two bones of the lower leg. Syndesmosis sprains usually only occur with very severe ankle sprains. SYMPTOMS  Pain, tenderness, and swelling in the ankle, starting at the side of injury that may progress to the whole ankle and foot with time.  "Pop" or tearing sensation at the time of injury.  Bruising that may spread to the heel.  Impaired ability to  walk soon after injury. CAUSES   Acute ankle sprains are caused by trauma placed on the ankle that temporarily forces or pries the anklebone (talus) out of its normal socket.  Stretching or tearing of the ligaments that normally hold the joint in place (usually due to a twisting injury). RISK INCREASES WITH:  Previous ankle sprain.  Sports in which the foot may land awkwardly (i.e., basketball, volleyball, or soccer) or walking or running on uneven or rough surfaces.  Shoes with inadequate support to prevent sideways motion when stress occurs.  Poor strength and flexibility.  Poor balance skills.  Contact sports. PREVENTION   Warm up and stretch properly before activity.  Maintain physical fitness:  Ankle and leg flexibility, muscle strength, and endurance.  Cardiovascular fitness.  Balance training activities.  Use proper technique  and have a coach correct improper technique.  Taping, protective strapping, bracing, or high-top tennis shoes may help prevent injury. Initially, tape is best; however, it loses most of its support function within 10 to 15 minutes.  Wear proper-fitted protective shoes (High-top shoes with taping or bracing is more effective than either alone).  Provide the ankle with support during sports and practice activities for 12 months following injury. PROGNOSIS   If treated properly, ankle sprains can be expected to recover completely; however, the length of recovery depends on the degree of injury.  A grade 1 sprain usually heals enough in 5 to 7 days to allow modified activity and requires an average of 6 weeks to heal completely.  A grade 2 sprain requires 6 to 10 weeks to heal completely.  A grade 3 sprain requires 12 to 16 weeks to heal.  A syndesmosis sprain often takes more than 3 months to heal. RELATED COMPLICATIONS   Frequent recurrence of symptoms may result in a chronic problem. Appropriately addressing the problem the first time  decreases the frequency of recurrence and optimizes healing time. Severity of the initial sprain does not predict the likelihood of later instability.  Injury to other structures (bone, cartilage, or tendon).  A chronically unstable or arthritic ankle joint is a possibility with repeated sprains. TREATMENT Treatment initially involves the use of ice, medication, and compression bandages to help reduce pain and inflammation. Ankle sprains are usually immobilized in a walking cast or boot to allow for healing. Crutches may be recommended to reduce pressure on the injury. After immobilization, strengthening and stretching exercises may be necessary to regain strength and a full range of motion. Surgery is rarely needed to treat ankle sprains. MEDICATION   Nonsteroidal anti-inflammatory medications, such as aspirin and ibuprofen (do not take for the first 3 days after injury or within 7 days before surgery), or other minor pain relievers, such as acetaminophen, are often recommended. Take these as directed by your caregiver. Contact your caregiver immediately if any bleeding, stomach upset, or signs of an allergic reaction occur from these medications.  Ointments applied to the skin may be helpful.  Pain relievers may be prescribed as necessary by your caregiver. Do not take prescription pain medication for longer than 4 to 7 days. Use only as directed and only as much as you need. HEAT AND COLD  Cold treatment (icing) is used to relieve pain and reduce inflammation for acute and chronic cases. Cold should be applied for 10 to 15 minutes every 2 to 3 hours for inflammation and pain and immediately after any activity that aggravates your symptoms. Use ice packs or an ice massage.  Heat treatment may be used before performing stretching and strengthening activities prescribed by your caregiver. Use a heat pack or a warm soak. SEEK IMMEDIATE MEDICAL CARE IF:   Pain, swelling, or bruising worsens  despite treatment.  You experience pain, numbness, discoloration, or coldness in the foot or toes.  New, unexplained symptoms develop (drugs used in treatment may produce side effects.) EXERCISES  PHASE I EXERCISES RANGE OF MOTION (ROM) AND STRETCHING EXERCISES - Ankle Sprain, Acute Phase I, Weeks 1 to 2 These exercises may help you when beginning to restore flexibility in your ankle. You will likely work on these exercises for the 1 to 2 weeks after your injury. Once your physician, physical therapist, or athletic trainer sees adequate progress, he or she will advance your exercises. While completing these exercises, remember:   Restoring tissue  flexibility helps normal motion to return to the joints. This allows healthier, less painful movement and activity.  An effective stretch should be held for at least 30 seconds.  A stretch should never be painful. You should only feel a gentle lengthening or release in the stretched tissue. RANGE OF MOTION - Dorsi/Plantar Flexion  While sitting with your right / left knee straight, draw the top of your foot upwards by flexing your ankle. Then reverse the motion, pointing your toes downward.  Hold each position for __________ seconds.  After completing your first set of exercises, repeat this exercise with your knee bent. Repeat __________ times. Complete this exercise __________ times per day.  RANGE OF MOTION - Ankle Alphabet  Imagine your right / left big toe is a pen.  Keeping your hip and knee still, write out the entire alphabet with your "pen." Make the letters as large as you can without increasing any discomfort. Repeat __________ times. Complete this exercise __________ times per day.  STRENGTHENING EXERCISES - Ankle Sprain, Acute -Phase I, Weeks 1 to 2 These exercises may help you when beginning to restore strength in your ankle. You will likely work on these exercises for 1 to 2 weeks after your injury. Once your physician, physical  therapist, or athletic trainer sees adequate progress, he or she will advance your exercises. While completing these exercises, remember:   Muscles can gain both the endurance and the strength needed for everyday activities through controlled exercises.  Complete these exercises as instructed by your physician, physical therapist, or athletic trainer. Progress the resistance and repetitions only as guided.  You may experience muscle soreness or fatigue, but the pain or discomfort you are trying to eliminate should never worsen during these exercises. If this pain does worsen, stop and make certain you are following the directions exactly. If the pain is still present after adjustments, discontinue the exercise until you can discuss the trouble with your clinician. STRENGTH - Dorsiflexors  Secure a rubber exercise band/tubing to a fixed object (i.e., table, pole) and loop the other end around your right / left foot.  Sit on the floor facing the fixed object. The band/tubing should be slightly tense when your foot is relaxed.  Slowly draw your foot back toward you using your ankle and toes.  Hold this position for __________ seconds. Slowly release the tension in the band and return your foot to the starting position. Repeat __________ times. Complete this exercise __________ times per day.  STRENGTH - Plantar-flexors   Sit with your right / left leg extended. Holding onto both ends of a rubber exercise band/tubing, loop it around the ball of your foot. Keep a slight tension in the band.  Slowly push your toes away from you, pointing them downward.  Hold this position for __________ seconds. Return slowly, controlling the tension in the band/tubing. Repeat __________ times. Complete this exercise __________ times per day.  STRENGTH - Ankle Eversion  Secure one end of a rubber exercise band/tubing to a fixed object (table, pole). Loop the other end around your foot just before your  toes.  Place your fists between your knees. This will focus your strengthening at your ankle.  Drawing the band/tubing across your opposite foot, slowly, pull your little toe out and up. Make sure the band/tubing is positioned to resist the entire motion.  Hold this position for __________ seconds. Have your muscles resist the band/tubing as it slowly pulls your foot back to the starting position.  Repeat __________ times. Complete this exercise __________ times per day.  STRENGTH - Ankle Inversion  Secure one end of a rubber exercise band/tubing to a fixed object (table, pole). Loop the other end around your foot just before your toes.  Place your fists between your knees. This will focus your strengthening at your ankle.  Slowly, pull your big toe up and in, making sure the band/tubing is positioned to resist the entire motion.  Hold this position for __________ seconds.  Have your muscles resist the band/tubing as it slowly pulls your foot back to the starting position. Repeat __________ times. Complete this exercises __________ times per day.  STRENGTH - Towel Curls  Sit in a chair positioned on a non-carpeted surface.  Place your right / left foot on a towel, keeping your heel on the floor.  Pull the towel toward your heel by only curling your toes. Keep your heel on the floor.  If instructed by your physician, physical therapist, or athletic trainer, add weight to the end of the towel. Repeat __________ times. Complete this exercise __________ times per day.   This information is not intended to replace advice given to you by your health care provider. Make sure you discuss any questions you have with your health care provider.   Document Released: 01/07/2005 Document Revised: 06/29/2014 Document Reviewed: 09/20/2008 Elsevier Interactive Patient Education [employer].

## 2016-01-07 NOTE — ED Provider Notes (Signed)
CSN: [id number]     Arrival date & time 01/07/16  3614 History   First MD Initiated Contact with Patient 01/07/16 [phone]     Chief Complaint  Patient presents with  . Ankle Pain     (Consider location/radiation/quality/duration/timing/severity/associated sxs/prior Treatment) Patient is a 40 y.o. female presenting with ankle pain.  Ankle Pain Location:  Ankle Time since incident:  12 hours Injury: yes   Mechanism of injury: fall   Ankle location:  R ankle Pain details:    Quality:  Throbbing and aching   Radiates to:  Does not radiate   Severity:  Moderate   Onset quality:  Gradual   Duration:  1 day   Timing:  Constant   Progression:  Unchanged Chronicity:  New Dislocation: no   Foreign body present:  No foreign bodies Prior injury to area:  Yes Relieved by:  Nothing Worsened by:  Bearing weight, flexion and activity Ineffective treatments:  None tried Associated symptoms: stiffness and swelling   Associated symptoms: no decreased ROM, no fatigue, no fever and no neck pain   Risk factors: no known bone disorder     40 year old female with no significant past medical history presents with pain of her lateral ankle. The patient states she was inverting her ankle last night in order to sit with her legs crossed when she felt a loud pop and she reports immediate onset of moderate swelling of the lateral ankle. She also had severe, aching, throbbing pain throughout her ankle. She presented to the ED but due to wait times returned home. She elevated her leg overnight and used an Ace wrap and ice. She reports her swelling has improved which has persistent pain so she subsequently presents for evaluation.  Past Medical History  Diagnosis Date  . Colitis, ulcerative ([place])   . Depression   . Anxiety   . Hypothyroidism   . Arthritis   . Chronic back pain    Past Surgical History  Procedure Laterality Date  . Knee surgery    . Ankle surgery    . Ovarian cyst removal    .  Cesarean section    . Breast augmentation    . Laparoscopic bilateral salpingectomy  07/27/2012    Procedure: LAPAROSCOPIC BILATERAL SALPINGECTOMY;  Surgeon: Florian Buff, MD;  Location: [facility];  Service: Gynecology;  Laterality: N/A;   Family History  Problem Relation Age of Onset  . Cancer Maternal Grandmother   . Cancer Maternal Grandfather    Social History  Substance Use Topics  . Smoking status: Current Every Day Smoker -- 0.50 packs/day for 15 years    Types: Cigarettes  . Smokeless tobacco: None  . Alcohol Use: Yes     Comment: occasionally   OB History    No data available     Review of Systems  Constitutional: Negative for fever and fatigue.  HENT: Negative for congestion and rhinorrhea.   Eyes: Negative for visual disturbance.  Respiratory: Negative for cough and shortness of breath.   Cardiovascular: Negative for chest pain and leg swelling.  Gastrointestinal: Negative for nausea, vomiting, abdominal pain and diarrhea.  Genitourinary: Negative for dysuria and flank pain.  Musculoskeletal: Positive for joint swelling, gait problem and stiffness. Negative for neck pain.  Skin: Negative for rash.  Allergic/Immunologic: Negative for immunocompromised state.  Neurological: Negative for syncope, weakness and headaches.      Allergies  Doxycycline and Sulfonamide derivatives  Home Medications   Prior to Admission medications   Medication  Sig Start Date End Date Taking? Authorizing Provider  amitriptyline (ELAVIL) 50 MG tablet Take 100 mg by mouth at bedtime. 10/17/14  Yes Historical Provider, MD  clonazePAM (KLONOPIN) 0.5 MG tablet Take 1 tablet twice a day as needed for nerves. 10/03/15  Yes Historical Provider, MD  furosemide (LASIX) 20 MG tablet Take 20 mg by mouth daily as needed for fluid.    Yes Historical Provider, MD  ibuprofen (ADVIL,MOTRIN) 200 MG tablet Take 800 mg by mouth every 6 (six) hours as needed.   Yes Historical Provider, MD  levothyroxine  (SYNTHROID, LEVOTHROID) 137 MCG tablet Take 137 mcg by mouth daily before breakfast.   Yes Historical Provider, MD  phenazopyridine (PYRIDIUM) 200 MG tablet Take 1 tablet (200 mg total) by mouth 3 (three) times daily. 10/10/15  Yes Hollace Kinnier Sofia, PA-C  silver sulfADIAZINE (SILVADENE) 1 % cream Use to area 2-3 times per day 12/26/15  Yes Florian Buff, MD  HYDROcodone-acetaminophen (NORCO/VICODIN) 5-325 MG tablet Take 1 tablet by mouth every 4 (four) hours as needed for severe pain. 01/07/16   Duffy Bruce, MD  naproxen (NAPROSYN) 375 MG tablet Take 1 tablet (375 mg total) by mouth 2 (two) times daily as needed for mild pain or moderate pain. 01/07/16   Duffy Bruce, MD   BP 114/93 mmHg  Pulse 86  Temp(Src) 98.4 F (36.9 C) (Oral)  Resp 18  Ht 5' 3"  (1.6 m)  Wt 166 lb (75.297 kg)  BMI 29.41 kg/m2  SpO2 97%  LMP 12/30/2015 (Exact Date) Physical Exam  Constitutional: She appears well-developed and well-nourished. No distress.  HENT:  Head: Normocephalic.  Mouth/Throat: No oropharyngeal exudate.  Eyes: Conjunctivae are normal. Pupils are equal, round, and reactive to light.  Neck: Neck supple.  Cardiovascular: Normal rate and normal heart sounds.   Pulmonary/Chest: Effort normal and breath sounds normal.  Abdominal: Soft.  Musculoskeletal: She exhibits no edema.  Neurological: She is alert.  Skin: Skin is warm. No rash noted.  Nursing note and vitals reviewed.   LOWER EXTREMITY EXAM: RIGHT  INSPECTION & PALPATION: Mild swelling over lateral malleolus with TTP over distal, posterior malleolus and entire medial malleolus. No open wounds. No ligamentous instability on anterior/posterior stress testing.   SENSORY: sensation is intact to light touch in:  Superficial peroneal nerve distribution (over dorsum of foot) Deep peroneal nerve distribution (over first dorsal web space) Sural nerve distribution (over lateral aspect 5th metatarsal) Saphenous nerve distribution (over medial  instep)  MOTOR:  + Motor EHL (great toe dorsiflexion) + FHL (great toe plantar flexion)  + TA (ankle dorsiflexion)  + GSC (ankle plantar flexion)  VASCULAR: 2+ dorsalis pedis and posterior tibialis pulses Capillary refill < 2 sec, toes warm and well-perfused  COMPARTMENTS: Soft, warm, well-perfused No pain with passive extension No parethesias  ED Course  Procedures (including critical care time) Labs Review Labs Reviewed - No data to display  Imaging Review Dg Ankle Complete Right  01/07/2016  CLINICAL DATA:  Right ankle pain starting yesterday EXAM: RIGHT ANKLE - COMPLETE 3+ VIEW COMPARISON:  03/18/2009 FINDINGS: Three views of the right ankle submitted. No acute fracture or subluxation. Ankle mortise is preserved. No radiopaque foreign body. IMPRESSION: Negative. Electronically Signed   By: Lahoma Crocker M.D.   On: 01/07/2016 09:05   I have personally reviewed and evaluated these images and lab results as part of my medical decision-making.   EKG Interpretation None      MDM  40 yo F with no significant  PMHx who presents with lateral ankle pain and swelling after inverting her foot to sit cross legged yesterday. Able to bare weight but with pain. No open wounds. Distal NV fully intact. No open wounds. No h/o bony pathology. No fall or other injuries. Plain films show no acute fracture or malalignment. Suspect mild, lateral ankle sprain/tendon injury. No instability on anterior/posterior stress testing of ankle. No evidence of [place] or high ankle sprain, with no TTP or instability on compression of upper leg. Will give ASO for support, advise return to function/WB as tolerated, and give short course of analgesics. Continue RICE. Pt in agreement. D/c'ed home in stable condition.  Clinical Impression: 1. Mild ankle sprain, right, initial encounter     Disposition: Discharge  Condition: Good  I have discussed the results, Dx and Tx plan with the pt(& family if present).  He/she/they expressed understanding and agree(s) with the plan. Discharge instructions discussed at great length. Strict return precautions discussed and pt &/or family have verbalized understanding of the instructions. No further questions at time of discharge.    New Prescriptions   HYDROCODONE-ACETAMINOPHEN (NORCO/VICODIN) 5-325 MG TABLET    Take 1 tablet by mouth every 4 (four) hours as needed for severe pain.   NAPROXEN (NAPROSYN) 375 MG TABLET    Take 1 tablet (375 mg total) by mouth 2 (two) times daily as needed for mild pain or moderate pain.    Follow Up: Sharilyn Sites, MD [address] [place] [address] [phone]   Follow-up in 1 week if symptoms do not improve   Duffy Bruce, MD 01/07/16 [phone]

## 2016-01-07 NOTE — ED Notes (Signed)
Pt states she was sitting with legs crossed and was pulling her rt ankle up and heard a pop. Now increased pain and swelling

## 2016-01-09 ENCOUNTER — Encounter: Payer: Self-pay | Admitting: Obstetrics & Gynecology

## 2016-01-09 ENCOUNTER — Other Ambulatory Visit (HOSPITAL_COMMUNITY)
Admission: RE | Admit: 2016-01-09 | Discharge: 2016-01-09 | Disposition: A | Discharge status: Home / Self Care | Payer: Self-pay | Source: Ambulatory Visit | Attending: Obstetrics & Gynecology | Admitting: Obstetrics & Gynecology

## 2016-01-09 ENCOUNTER — Ambulatory Visit (INDEPENDENT_AMBULATORY_CARE_PROVIDER_SITE_OTHER): Payer: Medicaid Other | Admitting: Obstetrics & Gynecology

## 2016-01-09 VITALS — BP 110/70 | HR 72 | Ht 63.0 in | Wt 175.0 lb

## 2016-01-09 DIAGNOSIS — Z308 Encounter for other contraceptive management: Secondary | ICD-10-CM | POA: Diagnosis not present

## 2016-01-09 DIAGNOSIS — Z01419 Encounter for gynecological examination (general) (routine) without abnormal findings: Secondary | ICD-10-CM

## 2016-01-09 MED ORDER — ESTRADIOL 2 MG PO TABS: 2.0000 mg | ORAL_TABLET | Freq: Every day | ORAL | Status: DC

## 2016-01-09 MED ORDER — MEDROXYPROGESTERONE ACETATE 2.5 MG PO TABS: 2.5000 mg | ORAL_TABLET | Freq: Every day | ORAL | Status: DC

## 2016-01-09 NOTE — Progress Notes (Signed)
Patient ID: Vanessa Bowen, female   DOB: November 29, 1975, 40 y.o.   MRN: [id number] Subjective:     Vanessa Bowen is a 40 y.o. female here for a routine exam.  Patient's last menstrual period was 12/30/2015 (exact date). No obstetric history on file. Birth Control Method:  S/p oophorectomy Menstrual Calendar(currently): amenorrheic  Current complaints: menopausal symptoms.   Current acute medical issues:     Recent Gynecologic History Patient's last menstrual period was 12/30/2015 (exact date). Last Pap: 5/16,  normal Last mammogram: ,    Past Medical History:  Diagnosis Date  . Anxiety   . Arthritis   . Chronic back pain   . Colitis, ulcerative ([place])   . Depression   . Hypothyroidism     Past Surgical History:  Procedure Laterality Date  . ANKLE SURGERY    . breast augmentation    . CESAREAN SECTION    . KNEE SURGERY    . LAPAROSCOPIC BILATERAL SALPINGECTOMY  07/27/2012   Procedure: LAPAROSCOPIC BILATERAL SALPINGECTOMY;  Surgeon: Florian Buff, MD;  Location: [facility];  Service: Gynecology;  Laterality: N/A;  . OVARIAN CYST REMOVAL      OB History    No data available      Social History   Social History  . Marital status: Married    Spouse name: N/A  . Number of children: N/A  . Years of education: N/A   Social History Main Topics  . Smoking status: Current Every Day Smoker    Packs/day: 0.50    Years: 15.00    Types: Cigarettes  . Smokeless tobacco: Never Used  . Alcohol use Yes     Comment: occasionally  . Drug use: No  . Sexual activity: Not Asked   Other Topics Concern  . None   Social History Narrative  . None    Family History  Problem Relation Age of Onset  . Cancer Maternal Grandmother   . Cancer Maternal Grandfather      Current Outpatient Prescriptions:  .  amitriptyline (ELAVIL) 50 MG tablet, Take 100 mg by mouth at bedtime., Disp: , Rfl: 1 .  clonazePAM (KLONOPIN) 0.5 MG tablet, Take 1 tablet twice a day as needed for nerves., Disp: , Rfl:  1 .  furosemide (LASIX) 20 MG tablet, Take 20 mg by mouth daily as needed for fluid. , Disp: , Rfl:  .  ibuprofen (ADVIL,MOTRIN) 200 MG tablet, Take 800 mg by mouth every 6 (six) hours as needed., Disp: , Rfl:  .  levothyroxine (SYNTHROID, LEVOTHROID) 137 MCG tablet, Take 137 mcg by mouth daily before breakfast., Disp: , Rfl:  .  estradiol (ESTRACE) 2 MG tablet, Take 1 tablet (2 mg total) by mouth daily., Disp: 30 tablet, Rfl: 11 .  medroxyPROGESTERone (PROVERA) 2.5 MG tablet, Take 1 tablet (2.5 mg total) by mouth daily., Disp: 30 tablet, Rfl: 11  Review of Systems  Review of Systems  Constitutional: Negative for fever, chills, weight loss, malaise/fatigue and diaphoresis.  HENT: Negative for hearing loss, ear pain, nosebleeds, congestion, sore throat, neck pain, tinnitus and ear discharge.   Eyes: Negative for blurred vision, double vision, photophobia, pain, discharge and redness.  Respiratory: Negative for cough, hemoptysis, sputum production, shortness of breath, wheezing and stridor.   Cardiovascular: Negative for chest pain, palpitations, orthopnea, claudication, leg swelling and PND.  Gastrointestinal: negative for abdominal pain. Negative for heartburn, nausea, vomiting, diarrhea, constipation, blood in stool and melena.  Genitourinary: Negative for dysuria, urgency, frequency, hematuria and flank pain.  Musculoskeletal: Negative for myalgias, back pain, joint pain and falls.  Skin: Negative for itching and rash.  Neurological: Negative for dizziness, tingling, tremors, sensory change, speech change, focal weakness, seizures, loss of consciousness, weakness and headaches.  Endo/Heme/Allergies: Negative for environmental allergies and polydipsia. Does not bruise/bleed easily.  Psychiatric/Behavioral: Negative for depression, suicidal ideas, hallucinations, memory loss and substance abuse. The patient is not nervous/anxious and does not have insomnia.        Objective:  Blood pressure  110/70, pulse 72, height 5' 3"  (1.6 m), weight 175 lb (79.4 kg), last menstrual period 12/30/2015.   Physical Exam  Vitals reviewed. Constitutional: She is oriented to person, place, and time. She appears well-developed and well-nourished.  HENT:  Head: Normocephalic and atraumatic.        Right Ear: External ear normal.  Left Ear: External ear normal.  Nose: Nose normal.  Mouth/Throat: Oropharynx is clear and moist.  Eyes: Conjunctivae and EOM are normal. Pupils are equal, round, and reactive to light. Right eye exhibits no discharge. Left eye exhibits no discharge. No scleral icterus.  Neck: Normal range of motion. Neck supple. No tracheal deviation present. No thyromegaly present.  Cardiovascular: Normal rate, regular rhythm, normal heart sounds and intact distal pulses.  Exam reveals no gallop and no friction rub.   No murmur heard. Respiratory: Effort normal and breath sounds normal. No respiratory distress. She has no wheezes. She has no rales. She exhibits no tenderness.  GI: Soft. Bowel sounds are normal. She exhibits no distension and no mass. There is no tenderness. There is no rebound and no guarding.  Genitourinary:  Breasts no masses skin changes or nipple changes bilaterally      Vulva is normal without lesions Vagina is pink moist without discharge Cervix normal in appearance and pap is done Uterus is normal size shape and contour Adnexa is negative with normal sized ovaries   Musculoskeletal: Normal range of motion. She exhibits no edema and no tenderness.  Neurological: She is alert and oriented to person, place, and time. She has normal reflexes. She displays normal reflexes. No cranial nerve deficit. She exhibits normal muscle tone. Coordination normal.  Skin: Skin is warm and dry. No rash noted. No erythema. No pallor.  Psychiatric: She has a normal mood and affect. Her behavior is normal. Judgment and thought content normal.       Medications Ordered at today's  visit: Meds ordered this encounter  Medications  . medroxyPROGESTERone (PROVERA) 2.5 MG tablet    Sig: Take 1 tablet (2.5 mg total) by mouth daily.    Dispense:  30 tablet    Refill:  11  . estradiol (ESTRACE) 2 MG tablet    Sig: Take 1 tablet (2 mg total) by mouth daily.    Dispense:  30 tablet    Refill:  11    Other orders placed at today's visit: No orders of the defined types were placed in this encounter.     Assessment:    Healthy female exam.    Plan:    Hormone replacement therapy: hormone replacement therapy: estrace and provera continuously. Mammogram ordered. Follow up in: 3 months.     Return in about 3 months (around 04/10/2016) for Follow up, with Dr Elonda Husky.

## 2016-01-13 LAB — CYTOLOGY - PAP

## 2016-01-29 ENCOUNTER — Ambulatory Visit: Payer: Medicaid Other | Admitting: Orthopaedic Surgery

## 2016-02-06 ENCOUNTER — Encounter (HOSPITAL_COMMUNITY): Payer: Self-pay

## 2016-02-06 ENCOUNTER — Emergency Department (HOSPITAL_COMMUNITY)
Admission: EM | Admit: 2016-02-06 | Discharge: 2016-02-06 | Disposition: A | Discharge status: Home / Self Care | Payer: Medicaid Other | Attending: Emergency Medicine | Admitting: Emergency Medicine

## 2016-02-06 ENCOUNTER — Emergency Department (HOSPITAL_COMMUNITY): Payer: Medicaid Other

## 2016-02-06 DIAGNOSIS — R600 Localized edema: Secondary | ICD-10-CM | POA: Insufficient documentation

## 2016-02-06 DIAGNOSIS — K5900 Constipation, unspecified: Secondary | ICD-10-CM | POA: Insufficient documentation

## 2016-02-06 DIAGNOSIS — Z791 Long term (current) use of non-steroidal anti-inflammatories (NSAID): Secondary | ICD-10-CM | POA: Insufficient documentation

## 2016-02-06 DIAGNOSIS — R0602 Shortness of breath: Secondary | ICD-10-CM | POA: Insufficient documentation

## 2016-02-06 DIAGNOSIS — R509 Fever, unspecified: Secondary | ICD-10-CM | POA: Insufficient documentation

## 2016-02-06 DIAGNOSIS — E039 Hypothyroidism, unspecified: Secondary | ICD-10-CM | POA: Insufficient documentation

## 2016-02-06 DIAGNOSIS — Z79899 Other long term (current) drug therapy: Secondary | ICD-10-CM | POA: Insufficient documentation

## 2016-02-06 DIAGNOSIS — F17210 Nicotine dependence, cigarettes, uncomplicated: Secondary | ICD-10-CM | POA: Insufficient documentation

## 2016-02-06 DIAGNOSIS — R609 Edema, unspecified: Secondary | ICD-10-CM

## 2016-02-06 LAB — CBC WITH DIFFERENTIAL/PLATELET
BASOS PCT: 1 %
Basophils Absolute: 0.1 10*3/uL (ref 0.0–0.1)
EOS ABS: 0.2 10*3/uL (ref 0.0–0.7)
EOS PCT: 1 %
HEMATOCRIT: 35 % — AB (ref 36.0–46.0)
Hemoglobin: 12 g/dL (ref 12.0–15.0)
Lymphocytes Relative: 28 %
Lymphs Abs: 4 10*3/uL (ref 0.7–4.0)
MCH: 31.5 pg (ref 26.0–34.0)
MCHC: 34.3 g/dL (ref 30.0–36.0)
MCV: 91.9 fL (ref 78.0–100.0)
MONO ABS: 0.8 10*3/uL (ref 0.1–1.0)
MONOS PCT: 6 %
NEUTROS ABS: 9.5 10*3/uL — AB (ref 1.7–7.7)
Neutrophils Relative %: 64 %
PLATELETS: 362 10*3/uL (ref 150–400)
RBC: 3.81 MIL/uL — ABNORMAL LOW (ref 3.87–5.11)
RDW: 13.6 % (ref 11.5–15.5)
WBC: 14.6 10*3/uL — ABNORMAL HIGH (ref 4.0–10.5)

## 2016-02-06 LAB — COMPREHENSIVE METABOLIC PANEL
ALBUMIN: 3.8 g/dL (ref 3.5–5.0)
ALT: 14 U/L (ref 14–54)
AST: 16 U/L (ref 15–41)
Alkaline Phosphatase: 56 U/L (ref 38–126)
Anion gap: 9 (ref 5–15)
BILIRUBIN TOTAL: 0.2 mg/dL — AB (ref 0.3–1.2)
BUN: 16 mg/dL (ref 6–20)
CALCIUM: 9 mg/dL (ref 8.9–10.3)
CO2: 24 mmol/L (ref 22–32)
CREATININE: 0.9 mg/dL (ref 0.44–1.00)
Chloride: 104 mmol/L (ref 101–111)
GFR calc Af Amer: >60 mL/min (ref >60)
Glucose, Bld: 110 mg/dL — ABNORMAL HIGH (ref 65–99)
Potassium: 3.7 mmol/L (ref 3.5–5.1)
Sodium: 137 mmol/L (ref 135–145)
TOTAL PROTEIN: 6.8 g/dL (ref 6.5–8.1)

## 2016-02-06 LAB — URINALYSIS, ROUTINE W REFLEX MICROSCOPIC
BILIRUBIN URINE: NEGATIVE
Glucose, UA: NEGATIVE mg/dL
Ketones, ur: NEGATIVE mg/dL
Leukocytes, UA: NEGATIVE
Nitrite: NEGATIVE
PROTEIN: NEGATIVE mg/dL
Specific Gravity, Urine: 1.015 (ref 1.005–1.030)
pH: 5.5 (ref 5.0–8.0)

## 2016-02-06 LAB — TROPONIN I

## 2016-02-06 LAB — URINE MICROSCOPIC-ADD ON

## 2016-02-06 LAB — BRAIN NATRIURETIC PEPTIDE: B Natriuretic Peptide: 26 pg/mL (ref 0.0–100.0)

## 2016-02-06 MED ORDER — ACETAMINOPHEN 325 MG PO TABS
650.0000 mg | ORAL_TABLET | Freq: Once | ORAL | Status: AC
  Administered 2016-02-06: 650 mg via ORAL
  Filled 2016-02-06: qty 2

## 2016-02-06 NOTE — ED Notes (Signed)
Physician in at bedside-

## 2016-02-06 NOTE — ED Notes (Signed)
To radiology

## 2016-02-06 NOTE — ED Notes (Signed)
Pt reports that she has had no rectal temp after asking for a blanket and being told she had a reported fever-

## 2016-02-06 NOTE — ED Triage Notes (Addendum)
Bilateral leg swelling x3 days with shortness of breath. States she has been taking Lasix 40 mg with no relief.

## 2016-02-06 NOTE — ED Notes (Signed)
Report to [place], [place]

## 2016-02-06 NOTE — ED Provider Notes (Signed)
[name] Provider Note   CSN: [id number] Arrival date & time: 02/06/16  1939  By signing my name below, I, Irene Pap, attest that this documentation has been prepared under the direction and in the presence of Merrily Pew, MD. Electronically Signed: Irene Pap, ED Scribe. 02/06/16. 8:21 PM.  History   Chief Complaint Chief Complaint  Patient presents with  . Leg Swelling   The history is provided by the patient. No language interpreter was used.  HPI Comments: Vanessa Bowen is a 40 y.o. female who presents to the Emergency Department complaining of gradually worsening bilateral leg swelling onset 3 days ago. She reports associated intermittent SOB and husband states that she looks more swollen in her arms. Pt has been constipated for a week. Pt is febrile tmax 103.2 F in the ED. She also states that she has having a "tightening" in her left neck that began today. She has a hx of panic attacks, but states that these symptoms do not feel similar. Pt has been taking Lasix 40 mg for her symptoms to no relief. Pt was prescribed the Lasix when her bilateral feet were swollen. She states that she has never had leg swelling, only feet swelling. Pt has not urinated more since taking the Lasix an hour ago. She was recently put on hormone medication by her OB/GYN. Pt is not on her feet a lot at work. She has not tried RICE techniques for her symptoms. She denies chest pain, nausea, vomiting, diarrhea, dysuria, frequency, or hematuria.   Past Medical History:  Diagnosis Date  . Anxiety   . Arthritis   . Chronic back pain   . Colitis, ulcerative ([place])   . Depression   . Hypothyroidism     Patient Active Problem List   Diagnosis Date Noted  . WEIGHT LOSS, RECENT 09/23/2009  . NAUSEA WITH VOMITING 09/23/2009  . DIARRHEA, ACUTE 09/23/2009  . EPIGASTRIC PAIN 09/23/2009  . PUD, HX OF 09/23/2009  . ULCERATIVE COLITIS, HX OF 09/23/2009    Past Surgical History:  Procedure  Laterality Date  . ANKLE SURGERY    . breast augmentation    . CESAREAN SECTION    . KNEE SURGERY    . LAPAROSCOPIC BILATERAL SALPINGECTOMY  07/27/2012   Procedure: LAPAROSCOPIC BILATERAL SALPINGECTOMY;  Surgeon: Florian Buff, MD;  Location: [facility];  Service: Gynecology;  Laterality: N/A;  . OVARIAN CYST REMOVAL      OB History    No data available       Home Medications    Prior to Admission medications   Medication Sig Start Date End Date Taking? Authorizing Provider  amitriptyline (ELAVIL) 50 MG tablet Take 100 mg by mouth at bedtime. 10/17/14  Yes Historical Provider, MD  clonazePAM (KLONOPIN) 0.5 MG tablet Take 1 tablet twice a day as needed for nerves. 10/03/15  Yes Historical Provider, MD  estradiol (ESTRACE) 2 MG tablet Take 1 tablet (2 mg total) by mouth daily. 01/09/16  Yes Florian Buff, MD  furosemide (LASIX) 20 MG tablet Take 20 mg by mouth daily as needed for fluid.    Yes Historical Provider, MD  ibuprofen (ADVIL,MOTRIN) 200 MG tablet Take 800 mg by mouth every 6 (six) hours as needed.   Yes Historical Provider, MD  levothyroxine (SYNTHROID, LEVOTHROID) 137 MCG tablet Take 137 mcg by mouth daily before breakfast.   Yes Historical Provider, MD  medroxyPROGESTERone (PROVERA) 2.5 MG tablet Take 1 tablet (2.5 mg total) by mouth daily. 01/09/16  Yes Toula Moos  Chriss Driver, MD    Family History Family History  Problem Relation Age of Onset  . Cancer Maternal Grandmother   . Cancer Maternal Grandfather     Social History Social History  Substance Use Topics  . Smoking status: Current Every Day Smoker    Packs/day: 0.50    Years: 15.00    Types: Cigarettes  . Smokeless tobacco: Never Used  . Alcohol use Yes     Comment: occasionally     Allergies   Doxycycline and Sulfonamide derivatives   Review of Systems Review of Systems  Constitutional: Positive for fever.  Respiratory: Positive for shortness of breath.   Cardiovascular: Positive for leg swelling. Negative for  chest pain.  Gastrointestinal: Positive for constipation. Negative for diarrhea, nausea and vomiting.  Genitourinary: Negative for dysuria, frequency and hematuria.  All other systems reviewed and are negative.    Physical Exam Updated Vital Signs BP 108/70   Pulse 101   Temp 98 F (36.7 C) (Oral)   Resp 14   Ht 5' 3"  (1.6 m)   Wt 182 lb (82.6 kg)   LMP 01/23/2016   SpO2 93%   BMI 32.24 kg/m   Physical Exam  Constitutional: She is oriented to person, place, and time. She appears well-developed and well-nourished.  HENT:  Head: Normocephalic and atraumatic.  Eyes: EOM are normal. Pupils are equal, round, and reactive to light.  Neck: Normal range of motion. Neck supple. No JVD present.  Cardiovascular: Normal rate, regular rhythm and normal heart sounds.  Exam reveals no gallop and no friction rub.   No murmur heard. Pulmonary/Chest: Effort normal.  Fine crackles in bilateral lower lobes  Abdominal: Soft. There is no tenderness.  Musculoskeletal: Normal range of motion.  No significant edema  Neurological: She is alert and oriented to person, place, and time.  Skin: Skin is warm and dry.  Psychiatric: She has a normal mood and affect. Her behavior is normal.  Nursing note and vitals reviewed.    ED Treatments / Results  DIAGNOSTIC STUDIES: Oxygen Saturation is 100% on RA, normal by my interpretation.    COORDINATION OF CARE: 8:19 PM-Discussed treatment plan which includes labs with pt at bedside and pt agreed to plan.    Labs (all labs ordered are listed, but only abnormal results are displayed) Labs Reviewed  CBC WITH DIFFERENTIAL/PLATELET - Abnormal; Notable for the following:       Result Value   WBC 14.6 (*)    RBC 3.81 (*)    HCT 35.0 (*)    Neutro Abs 9.5 (*)    All other components within normal limits  COMPREHENSIVE METABOLIC PANEL - Abnormal; Notable for the following:    Glucose, Bld 110 (*)    Total Bilirubin 0.2 (*)    All other components  within normal limits  URINALYSIS, ROUTINE W REFLEX MICROSCOPIC (NOT AT [facility]) - Abnormal; Notable for the following:    Hgb urine dipstick TRACE (*)    All other components within normal limits  URINE MICROSCOPIC-ADD ON - Abnormal; Notable for the following:    Squamous Epithelial / LPF 6-30 (*)    Bacteria, UA FEW (*)    All other components within normal limits  TROPONIN I  BRAIN NATRIURETIC PEPTIDE    EKG  EKG Interpretation  Date/Time:  Thursday February 06 2016 20:29:16 EDT Ventricular Rate:  97 PR Interval:    QRS Duration: 94 QT Interval:  329 QTC Calculation: 418 R Axis:   92 Text  Interpretation:  Sinus rhythm Borderline right axis deviation Borderline repolarization abnormality Confirmed by [facility] MD, Khamille Beynon [phone]) on 02/06/2016 11:02:49 PM       Radiology Dg Chest 2 View  Result Date: 02/06/2016 CLINICAL DATA:  Bilateral leg and bilateral hand swelling for 3 days. Shortness of breath and left chest tightness for 2 days. EXAM: CHEST  2 VIEW COMPARISON:  07/20/2011 FINDINGS: Slightly shallow inspiration with linear atelectasis in the lung bases. Heart size and pulmonary vascularity are normal. No focal airspace disease or consolidation in the lungs. No blunting of costophrenic angles. No pneumothorax. Mediastinal contours appear intact. IMPRESSION: No active cardiopulmonary disease. Electronically Signed   By: Lucienne Capers M.D.   On: 02/06/2016 21:40    Procedures Procedures (including critical care time)  Medications Ordered in ED Medications  acetaminophen (TYLENOL) tablet 650 mg (650 mg Oral Given 02/06/16 2113)     Initial Impression / Assessment and Plan / ED Course  I have reviewed the triage vital signs and the nursing notes.  Pertinent labs & imaging results that were available during my care of the patient were reviewed by me and considered in my medical decision making (see chart for details).  Clinical Course    Unsure of cause of ble edema. It is  not extremely obvious on exam. No asymmetry to suggest DVT. Cardiac workup without evidence of CHF exacerbation. No obvious medications to cause same. Doubt more central thrombosis. Slight ST depression seen on monitor, so two ecg's done without changes to suggest ischemia or other cardiac causes.  Unsure of cause, will follow up with PCP for further evaluation and management.   Final Clinical Impressions(s) / ED Diagnoses   Final diagnoses:  Peripheral edema  I personally performed the services described in this documentation, which was scribed in my presence. The recorded information has been reviewed and is accurate.   New Prescriptions Discharge Medication List as of 02/06/2016 10:53 PM       Merrily Pew, MD 02/07/16 [phone]

## 2016-02-21 ENCOUNTER — Other Ambulatory Visit: Payer: Self-pay | Admitting: "Endocrinology

## 2016-02-21 DIAGNOSIS — E039 Hypothyroidism, unspecified: Secondary | ICD-10-CM

## 2016-02-21 DIAGNOSIS — E559 Vitamin D deficiency, unspecified: Secondary | ICD-10-CM

## 2016-03-05 ENCOUNTER — Ambulatory Visit: Payer: Medicaid Other | Admitting: Orthopaedic Surgery

## 2016-03-09 ENCOUNTER — Ambulatory Visit: Payer: Medicaid Other | Admitting: "Endocrinology

## 2016-03-12 ENCOUNTER — Other Ambulatory Visit: Payer: Self-pay | Admitting: "Endocrinology

## 2016-03-12 LAB — T4, FREE: Free T4: 1.8 ng/dL (ref 0.8–1.8)

## 2016-03-12 LAB — TSH: TSH: 0.16 m[IU]/L — AB

## 2016-03-13 LAB — VITAMIN D 25 HYDROXY (VIT D DEFICIENCY, FRACTURES): Vit D, 25-Hydroxy: 32 ng/mL (ref 30–100)

## 2016-03-18 ENCOUNTER — Encounter: Payer: Self-pay | Admitting: "Endocrinology

## 2016-03-18 ENCOUNTER — Ambulatory Visit (INDEPENDENT_AMBULATORY_CARE_PROVIDER_SITE_OTHER): Payer: Self-pay | Admitting: "Endocrinology

## 2016-03-18 VITALS — BP 135/80 | HR 95 | Ht 63.0 in | Wt 186.0 lb

## 2016-03-18 DIAGNOSIS — E038 Other specified hypothyroidism: Secondary | ICD-10-CM

## 2016-03-18 MED ORDER — PHENTERMINE-TOPIRAMATE ER 7.5-46 MG PO CP24: 1.0000 | ORAL_CAPSULE | Freq: Every day | ORAL | 2 refills | Status: DC

## 2016-03-18 MED ORDER — LEVOTHYROXINE SODIUM 137 MCG PO TABS: 137.0000 ug | ORAL_TABLET | Freq: Every day | ORAL | 12 refills | Status: DC

## 2016-03-18 MED ORDER — PHENTERMINE-TOPIRAMATE ER 3.75-23 MG PO CP24: 1.0000 | ORAL_CAPSULE | Freq: Every day | ORAL | 0 refills | Status: DC

## 2016-03-18 NOTE — Progress Notes (Signed)
Subjective:    Patient ID: Vanessa Bowen, female    DOB: May 10, 1976, PCP Purvis Kilts, MD   Past Medical History:  Diagnosis Date  . Anxiety   . Arthritis   . Chronic back pain   . Colitis, ulcerative ([place])   . Depression   . Hypothyroidism    Past Surgical History:  Procedure Laterality Date  . ANKLE SURGERY    . breast augmentation    . CESAREAN SECTION    . KNEE SURGERY    . LAPAROSCOPIC BILATERAL SALPINGECTOMY  07/27/2012   Procedure: LAPAROSCOPIC BILATERAL SALPINGECTOMY;  Surgeon: Florian Buff, MD;  Location: [facility];  Service: Gynecology;  Laterality: N/A;  . OVARIAN CYST REMOVAL     Social History   Social History  . Marital status: Married    Spouse name: N/A  . Number of children: N/A  . Years of education: N/A   Social History Main Topics  . Smoking status: Current Every Day Smoker    Packs/day: 0.50    Years: 15.00    Types: Cigarettes  . Smokeless tobacco: Never Used  . Alcohol use Yes     Comment: occasionally  . Drug use: No  . Sexual activity: Not Asked   Other Topics Concern  . None   Social History Narrative  . None   Outpatient Encounter Prescriptions as of 03/18/2016  Medication Sig  . amitriptyline (ELAVIL) 50 MG tablet Take 100 mg by mouth at bedtime.  . clonazePAM (KLONOPIN) 0.5 MG tablet Take 1 tablet twice a day as needed for nerves.  . furosemide (LASIX) 20 MG tablet Take 20 mg by mouth daily as needed for fluid.   Marland Kitchen ibuprofen (ADVIL,MOTRIN) 200 MG tablet Take 800 mg by mouth every 6 (six) hours as needed.  Marland Kitchen levothyroxine (SYNTHROID, LEVOTHROID) 137 MCG tablet Take 1 tablet (137 mcg total) by mouth daily before breakfast.  . Phentermine-Topiramate (QSYMIA) 7.5-46 MG CP24 Take 1 capsule by mouth daily.  . Phentermine-Topiramate 3.75-23 MG CP24 Take 1 capsule by mouth daily.  . [DISCONTINUED] estradiol (ESTRACE) 2 MG tablet Take 1 tablet (2 mg total) by mouth daily.  . [DISCONTINUED] levothyroxine (SYNTHROID, LEVOTHROID) 137  MCG tablet Take 137 mcg by mouth daily before breakfast.  . [DISCONTINUED] medroxyPROGESTERone (PROVERA) 2.5 MG tablet Take 1 tablet (2.5 mg total) by mouth daily.   No facility-administered encounter medications on file as of 03/18/2016.    ALLERGIES: Allergies  Allergen Reactions  . Doxycycline Nausea And Vomiting  . Sulfonamide Derivatives Nausea And Vomiting   VACCINATION STATUS:  There is no immunization history on file for this patient.  HPI  40 yr old female with medical hx as follows. She is here for a f/u of hypothyroidism diagnosed approximately 11 years ago. Currently on 137 mcg po qday. Pt feels better. She did not afford qsymia and she is not taking it. She is regaining 7 lbs after she lost 39 lbs overall .   Review of Systems Constitutional: +weight gain,  +fatigue, no subjective hyperthermia/hypothermia Eyes: no blurry vision, no xerophthalmia ENT: no sore throat, no nodules palpated in throat, no dysphagia/odynophagia, no hoarseness Cardiovascular: no CP/SOB/palpitations/leg swelling Respiratory: no cough/SOB Gastrointestinal: no N/V/D/C Musculoskeletal: no muscle/joint aches Skin: no rashes Neurological: no tremors/numbness/tingling/dizziness Psychiatric: +depression  Objective:    BP 135/80   Pulse 95   Ht 5' 3"  (1.6 m)   Wt 186 lb (84.4 kg)   BMI 32.95 kg/m   Wt Readings from Last 3 Encounters:  03/18/16 186 lb (84.4 kg)  02/06/16 182 lb (82.6 kg)  01/09/16 175 lb (79.4 kg)    Physical Exam  Constitutional: Obese, in NAD Eyes: PERRLA, EOMI, no exophthalmos ENT: moist mucous membranes, no thyromegaly, no cervical lymphadenopathy Cardiovascular: RRR, No MRG Respiratory: CTA B Gastrointestinal: abdomen soft, NT, ND, BS+ Musculoskeletal: no deformities, strength intact in all 4 Skin: moist, warm, no rashes Neurological: no tremor with outstretched hands, DTR normal in all 4  CMP     Component Value Date/Time   NA 137 02/06/2016 2043   K 3.7  02/06/2016 2043   CL 104 02/06/2016 2043   CO2 24 02/06/2016 2043   GLUCOSE 110 (H) 02/06/2016 2043   BUN 16 02/06/2016 2043   CREATININE 0.90 02/06/2016 2043   CALCIUM 9.0 02/06/2016 2043   PROT 6.8 02/06/2016 2043   ALBUMIN 3.8 02/06/2016 2043   AST 16 02/06/2016 2043   ALT 14 02/06/2016 2043   ALKPHOS 56 02/06/2016 2043   BILITOT 0.2 (L) 02/06/2016 2043   GFRNONAA >60 02/06/2016 2043   GFRAA >60 02/06/2016 2043   Recent Results (from the past 2160 hour(s))  Cytology - PAP     Status: None   Collection Time: 01/09/16 12:00 AM  Result Value Ref Range   CYTOLOGY - PAP PAP RESULT   CBC with Differential     Status: Abnormal   Collection Time: 02/06/16  8:43 PM  Result Value Ref Range   WBC 14.6 (H) 4.0 - 10.5 K/uL   RBC 3.81 (L) 3.87 - 5.11 MIL/uL   Hemoglobin 12.0 12.0 - 15.0 g/dL   HCT 35.0 (L) 36.0 - 46.0 %   MCV 91.9 78.0 - 100.0 fL   MCH 31.5 26.0 - 34.0 pg   MCHC 34.3 30.0 - 36.0 g/dL   RDW 13.6 11.5 - 15.5 %   Platelets 362 150 - 400 K/uL   Neutrophils Relative % 64 %   Neutro Abs 9.5 (H) 1.7 - 7.7 K/uL   Lymphocytes Relative 28 %   Lymphs Abs 4.0 0.7 - 4.0 K/uL   Monocytes Relative 6 %   Monocytes Absolute 0.8 0.1 - 1.0 K/uL   Eosinophils Relative 1 %   Eosinophils Absolute 0.2 0.0 - 0.7 K/uL   Basophils Relative 1 %   Basophils Absolute 0.1 0.0 - 0.1 K/uL  Comprehensive metabolic panel     Status: Abnormal   Collection Time: 02/06/16  8:43 PM  Result Value Ref Range   Sodium 137 135 - 145 mmol/L   Potassium 3.7 3.5 - 5.1 mmol/L   Chloride 104 101 - 111 mmol/L   CO2 24 22 - 32 mmol/L   Glucose, Bld 110 (H) 65 - 99 mg/dL   BUN 16 6 - 20 mg/dL   Creatinine, Ser 0.90 0.44 - 1.00 mg/dL   Calcium 9.0 8.9 - 10.3 mg/dL   Total Protein 6.8 6.5 - 8.1 g/dL   Albumin 3.8 3.5 - 5.0 g/dL   AST 16 15 - 41 U/L   ALT 14 14 - 54 U/L   Alkaline Phosphatase 56 38 - 126 U/L   Total Bilirubin 0.2 (L) 0.3 - 1.2 mg/dL   GFR calc non Af Amer >60 >60 mL/min   GFR calc Af  Amer >60 >60 mL/min    Comment: (NOTE) The eGFR has been calculated using the CKD EPI equation. This calculation has not been validated in all clinical situations. eGFR's persistently <60 mL/min signify possible Chronic Kidney Disease.    Anion  gap 9 5 - 15  Urinalysis, Routine w reflex microscopic (not at [facility])     Status: Abnormal   Collection Time: 02/06/16  8:43 PM  Result Value Ref Range   Color, Urine YELLOW YELLOW   APPearance CLEAR CLEAR   Specific Gravity, Urine 1.015 1.005 - 1.030   pH 5.5 5.0 - 8.0   Glucose, UA NEGATIVE NEGATIVE mg/dL   Hgb urine dipstick TRACE (A) NEGATIVE   Bilirubin Urine NEGATIVE NEGATIVE   Ketones, ur NEGATIVE NEGATIVE mg/dL   Protein, ur NEGATIVE NEGATIVE mg/dL   Nitrite NEGATIVE NEGATIVE   Leukocytes, UA NEGATIVE NEGATIVE  Troponin I     Status: None   Collection Time: 02/06/16  8:43 PM  Result Value Ref Range   Troponin I <0.03 <0.03 ng/mL  Urine microscopic-add on     Status: Abnormal   Collection Time: 02/06/16  8:43 PM  Result Value Ref Range   Squamous Epithelial / LPF 6-30 (A) NONE SEEN   WBC, UA 0-5 0 - 5 WBC/hpf   RBC / HPF 0-5 0 - 5 RBC/hpf   Bacteria, UA FEW (A) NONE SEEN  Brain natriuretic peptide     Status: None   Collection Time: 02/06/16  8:44 PM  Result Value Ref Range   B Natriuretic Peptide 26.0 0.0 - 100.0 pg/mL  VITAMIN D 25 Hydroxy (Vit-D Deficiency, Fractures)     Status: None   Collection Time: 03/12/16  8:44 AM  Result Value Ref Range   Vit D, 25-Hydroxy 32 30 - 100 ng/mL    Comment: Vitamin D Status           25-OH Vitamin D        Deficiency                <20 ng/mL        Insufficiency         20 - 29 ng/mL        Optimal             > or = 30 ng/mL   For 25-OH Vitamin D testing on patients on D2-supplementation and patients for whom quantitation of D2 and D3 fractions is required, the QuestAssureD 25-OH VIT D, (D2,D3), LC/MS/MS is recommended: order code [phone] (patients > 2 yrs).   T4, free     Status:  None   Collection Time: 03/12/16  8:50 AM  Result Value Ref Range   Free T4 1.8 0.8 - 1.8 ng/dL  TSH     Status: Abnormal   Collection Time: 03/12/16  8:53 AM  Result Value Ref Range   TSH 0.16 (L) mIU/L    Comment:   Reference Range   > or = 20 Years  0.40-4.50   Pregnancy Range First trimester  0.26-2.66 Second trimester 0.55-2.73 Third trimester  0.43-2.91       Assessment & Plan:   1. Other specified hypothyroidism - She no showed for almost a year for her clinic visits. Her thyroid hormone was interrupted for 3-4 months. She has gained 26 pounds. She was able to get prescription a month ago. Levothyroxine at 137 g by mouth every morning. Her labs are reviewed , c/w appropriate replacement. I will continue levothyroxine 137 mcg po qam.   - We discussed about correct intake of levothyroxine, at fasting, with water, separated by at least 30 minutes from breakfast, and separated by more than 4 hours from calcium, iron, multivitamins, acid reflux medications (PPIs). -Patient is made aware  of the fact that thyroid hormone replacement is needed for life, dose to be adjusted by periodic monitoring of thyroid function tests. 2. Obesity with BMI of 33 -In the past , she has taken and benefited from Qsymia , lost 39 pounds, before she was forced to stop it due to cost. She would like to have a prescription was this medication again. - I will prescribe Qsymia for the next 3 months since she has no contraindications.  - I advised patient to maintain close follow up with Purvis Kilts, MD for primary care needs. Follow up plan: Return in about 3 months (around 06/17/2016) for follow up with pre-visit labs.  Glade Lloyd, MD Phone: [phone]  Fax: [phone]   03/18/2016, 3:23 PM

## 2016-03-31 ENCOUNTER — Ambulatory Visit (INDEPENDENT_AMBULATORY_CARE_PROVIDER_SITE_OTHER): Payer: Medicaid Other | Admitting: Orthopaedic Surgery

## 2016-03-31 ENCOUNTER — Encounter: Payer: Self-pay | Admitting: Orthopaedic Surgery

## 2016-03-31 VITALS — BP 108/70 | HR 92 | Ht 63.0 in | Wt 182.0 lb

## 2016-03-31 DIAGNOSIS — F17210 Nicotine dependence, cigarettes, uncomplicated: Secondary | ICD-10-CM

## 2016-03-31 DIAGNOSIS — M545 Low back pain, unspecified: Secondary | ICD-10-CM

## 2016-03-31 DIAGNOSIS — K5190 Ulcerative colitis, unspecified, without complications: Secondary | ICD-10-CM

## 2016-03-31 DIAGNOSIS — G8929 Other chronic pain: Secondary | ICD-10-CM

## 2016-03-31 MED ORDER — HYDROCODONE-ACETAMINOPHEN 5-325 MG PO TABS: 1.0000 | ORAL_TABLET | ORAL | 0 refills | Status: DC | PRN

## 2016-03-31 NOTE — Progress Notes (Signed)
Patient Vanessa Bowen, female DOB:[birth year], 40 y.o. MBW:[id number]  Chief Complaint  Patient presents with  . New Patient (Initial Visit)    Back pain    HPI  Vanessa Bowen is a 40 y.o. female who has chronic pain of the lower back.  She has no new acute findings. She has no bowel or bladder problems.  She is doing her exercises.  She is still smoking.  I have talked to her about cutting back or stopping. She is willing to do it. HPI  Body mass index is 32.24 kg/m.  ROS  Review of Systems  HENT: Negative for congestion.   Respiratory: Negative for cough and shortness of breath.   Cardiovascular: Negative for chest pain and leg swelling.  Endocrine: Positive for cold intolerance.  Musculoskeletal: Positive for arthralgias and back pain.  Allergic/Immunologic: Positive for environmental allergies.    Past Medical History:  Diagnosis Date  . Anxiety   . Arthritis   . Chronic back pain   . Colitis, ulcerative ([place])   . Depression   . Hypothyroidism     Past Surgical History:  Procedure Laterality Date  . ANKLE SURGERY    . breast augmentation    . CESAREAN SECTION    . KNEE SURGERY    . LAPAROSCOPIC BILATERAL SALPINGECTOMY  07/27/2012   Procedure: LAPAROSCOPIC BILATERAL SALPINGECTOMY;  Surgeon: Florian Buff, MD;  Location: [facility];  Service: Gynecology;  Laterality: N/A;  . OVARIAN CYST REMOVAL      Family History  Problem Relation Age of Onset  . Cancer Maternal Grandmother   . Cancer Maternal Grandfather     Social History Social History  Substance Use Topics  . Smoking status: Current Every Day Smoker    Packs/day: 0.50    Years: 15.00    Types: Cigarettes  . Smokeless tobacco: Never Used  . Alcohol use Yes     Comment: occasionally    Allergies  Allergen Reactions  . Doxycycline Nausea And Vomiting  . Sulfonamide Derivatives Nausea And Vomiting    Current Outpatient Prescriptions  Medication Sig Dispense Refill  . amitriptyline (ELAVIL) 50 MG  tablet Take 100 mg by mouth at bedtime.  1  . clonazePAM (KLONOPIN) 0.5 MG tablet Take 1 tablet twice a day as needed for nerves.  1  . furosemide (LASIX) 20 MG tablet Take 20 mg by mouth daily as needed for fluid.     Marland Kitchen ibuprofen (ADVIL,MOTRIN) 200 MG tablet Take 800 mg by mouth every 6 (six) hours as needed.    Marland Kitchen levothyroxine (SYNTHROID, LEVOTHROID) 137 MCG tablet Take 1 tablet (137 mcg total) by mouth daily before breakfast. 30 tablet 12  . Phentermine-Topiramate (QSYMIA) 7.5-46 MG CP24 Take 1 capsule by mouth daily. 30 capsule 2  . HYDROcodone-acetaminophen (NORCO/VICODIN) 5-325 MG tablet Take 1 tablet by mouth every 4 (four) hours as needed for moderate pain (Must last 14 days.Do not take and drive a car or use machinery.). 56 tablet 0   No current facility-administered medications for this visit.      Physical Exam  Blood pressure 108/70, pulse 92, height 5' 3"  (1.6 m), weight 182 lb (82.6 kg).  Constitutional: overall normal hygiene, normal nutrition, well developed, normal grooming, normal body habitus. Assistive device:none  Musculoskeletal: gait and station Limp none, muscle tone and strength are normal, no tremors or atrophy is present.  .  Neurological: coordination overall normal.  Deep tendon reflex/nerve stretch intact.  Sensation normal.  Cranial nerves II-XII intact.  Skin:   Normal overall no scars, lesions, ulcers or rashes. No psoriasis.  Psychiatric: Alert and oriented x 3.  Recent memory intact, remote memory unclear.  Normal mood and affect. Well groomed.  Good eye contact.  Cardiovascular: overall no swelling, no varicosities, no edema bilaterally, normal temperatures of the legs and arms, no clubbing, cyanosis and good capillary refill.  Lymphatic: palpation is normal.  Spine/Pelvis examination:  Inspection:  Overall, sacoiliac joint benign and hips nontender; without crepitus or defects.   Thoracic spine inspection: Alignment normal without kyphosis  present   Lumbar spine inspection:  Alignment  with normal lumbar lordosis, without scoliosis apparent.   Thoracic spine palpation:  without tenderness of spinal processes   Lumbar spine palpation: with tenderness of lumbar area; without tightness of lumbar muscles    Range of Motion:   Lumbar flexion, forward flexion is 35 without pain or tenderness    Lumbar extension is 10 without pain or tenderness   Left lateral bend is Normal  without pain or tenderness   Right lateral bend is Normal without pain or tenderness   Straight leg raising is Normal   Strength & tone: Normal   Stability overall normal stability     The patient has been educated about the nature of the problem(s) and counseled on treatment options.  The patient appeared to understand what I have discussed and is in agreement with it.  Encounter Diagnoses  Name Primary?  . Chronic midline low back pain without sciatica Yes  . Ulcerative colitis without complications, unspecified location ([place])   . Cigarette nicotine dependence without complication     PLAN Call if any problems.  Precautions discussed.  Continue current medications.   Return to clinic 3 months   Electronically Signed Sanjuana Kava, MD 10/10/20172:31 PM

## 2016-03-31 NOTE — Patient Instructions (Signed)
Smoking Cessation, Tips for Success If you are ready to quit smoking, congratulations! You have chosen to help yourself be healthier. Cigarettes bring nicotine, tar, carbon monoxide, and other irritants into your body. Your lungs, heart, and blood vessels will be able to work better without these poisons. There are many different ways to quit smoking. Nicotine gum, nicotine patches, a nicotine inhaler, or nicotine nasal spray can help with physical craving. Hypnosis, support groups, and medicines help break the habit of smoking. WHAT THINGS CAN I DO TO MAKE QUITTING EASIER?  Here are some tips to help you quit for good:  Pick a date when you will quit smoking completely. Tell all of your friends and family about your plan to quit on that date.  Do not try to slowly cut down on the number of cigarettes you are smoking. Pick a quit date and quit smoking completely starting on that day.  Throw away all cigarettes.   Clean and remove all ashtrays from your home, work, and car.  On a card, write down your reasons for quitting. Carry the card with you and read it when you get the urge to smoke.  Cleanse your body of nicotine. Drink enough water and fluids to keep your urine clear or pale yellow. Do this after quitting to flush the nicotine from your body.  Learn to predict your moods. Do not let a bad situation be your excuse to have a cigarette. Some situations in your life might tempt you into wanting a cigarette.  Never have "just one" cigarette. It leads to wanting another and another. Remind yourself of your decision to quit.  Change habits associated with smoking. If you smoked while driving or when feeling stressed, try other activities to replace smoking. Stand up when drinking your coffee. Brush your teeth after eating. Sit in a different chair when you read the paper. Avoid alcohol while trying to quit, and try to drink fewer caffeinated beverages. Alcohol and caffeine may urge you to  smoke.  Avoid foods and drinks that can trigger a desire to smoke, such as sugary or spicy foods and alcohol.  Ask people who smoke not to smoke around you.  Have something planned to do right after eating or having a cup of coffee. For example, plan to take a walk or exercise.  Try a relaxation exercise to calm you down and decrease your stress. Remember, you may be tense and nervous for the first 2 weeks after you quit, but this will pass.  Find new activities to keep your hands busy. Play with a pen, coin, or rubber band. Doodle or draw things on paper.  Brush your teeth right after eating. This will help cut down on the craving for the taste of tobacco after meals. You can also try mouthwash.   Use oral substitutes in place of cigarettes. Try using lemon drops, carrots, cinnamon sticks, or chewing gum. Keep them handy so they are available when you have the urge to smoke.  When you have the urge to smoke, try deep breathing.  Designate your home as a nonsmoking area.  If you are a heavy smoker, ask your health care provider about a prescription for nicotine chewing gum. It can ease your withdrawal from nicotine.  Reward yourself. Set aside the cigarette money you save and buy yourself something nice.  Look for support from others. Join a support group or smoking cessation program. Ask someone at home or at work to help you with your plan  to quit smoking.  Always ask yourself, "Do I need this cigarette or is this just a reflex?" Tell yourself, "Today, I choose not to smoke," or "I do not want to smoke." You are reminding yourself of your decision to quit.  Do not replace cigarette smoking with electronic cigarettes (commonly called e-cigarettes). The safety of e-cigarettes is unknown, and some may contain harmful chemicals.  If you relapse, do not give up! Plan ahead and think about what you will do the next time you get the urge to smoke. HOW WILL I FEEL WHEN I QUIT SMOKING? You  may have symptoms of withdrawal because your body is used to nicotine (the addictive substance in cigarettes). You may crave cigarettes, be irritable, feel very hungry, cough often, get headaches, or have difficulty concentrating. The withdrawal symptoms are only temporary. They are strongest when you first quit but will go away within 10-14 days. When withdrawal symptoms occur, stay in control. Think about your reasons for quitting. Remind yourself that these are signs that your body is healing and getting used to being without cigarettes. Remember that withdrawal symptoms are easier to treat than the major diseases that smoking can cause.  Even after the withdrawal is over, expect periodic urges to smoke. However, these cravings are generally short lived and will go away whether you smoke or not. Do not smoke! WHAT RESOURCES ARE AVAILABLE TO HELP ME QUIT SMOKING? Your health care provider can direct you to community resources or hospitals for support, which may include:  Group support.  Education.  Hypnosis.  Therapy.   This information is not intended to replace advice given to you by your health care provider. Make sure you discuss any questions you have with your health care provider.   Document Released: 03/06/2004 Document Revised: 06/29/2014 Document Reviewed: 11/24/2012 Elsevier Interactive Patient Education 2016 [employer] Back Exercises The following exercises strengthen the muscles that help to support the back. They also help to keep the lower back flexible. Doing these exercises can help to prevent back pain or lessen existing pain. If you have back pain or discomfort, try doing these exercises 2-3 times each day or as told by your health care provider. When the pain goes away, do them once each day, but increase the number of times that you repeat the steps for each exercise (do more repetitions). If you do not have back pain or discomfort, do these exercises once each day or as  told by your health care provider. EXERCISES Single Knee to Chest Repeat these steps 3-5 times for each leg: 1. Lie on your back on a firm bed or the floor with your legs extended. 2. Bring one knee to your chest. Your other leg should stay extended and in contact with the floor. 3. Hold your knee in place by grabbing your knee or thigh. 4. Pull on your knee until you feel a gentle stretch in your lower back. 5. Hold the stretch for 10-30 seconds. 6. Slowly release and straighten your leg. Pelvic Tilt Repeat these steps 5-10 times: 1. Lie on your back on a firm bed or the floor with your legs extended. 2. Bend your knees so they are pointing toward the ceiling and your feet are flat on the floor. 3. Tighten your lower abdominal muscles to press your lower back against the floor. This motion will tilt your pelvis so your tailbone points up toward the ceiling instead of pointing to your feet or the floor. 4. With gentle tension  and even breathing, hold this position for 5-10 seconds. Cat-Cow Repeat these steps until your lower back becomes more flexible: 1. Get into a hands-and-knees position on a firm surface. Keep your hands under your shoulders, and keep your knees under your hips. You may place padding under your knees for comfort. 2. Let your head hang down, and point your tailbone toward the floor so your lower back becomes rounded like the back of a cat. 3. Hold this position for 5 seconds. 4. Slowly lift your head and point your tailbone up toward the ceiling so your back forms a sagging arch like the back of a cow. 5. Hold this position for 5 seconds. Press-Ups Repeat these steps 5-10 times: 1. Lie on your abdomen (face-down) on the floor. 2. Place your palms near your head, about shoulder-width apart. 3. While you keep your back as relaxed as possible and keep your hips on the floor, slowly straighten your arms to raise the top half of your body and lift your shoulders. Do not use  your back muscles to raise your upper torso. You may adjust the placement of your hands to make yourself more comfortable. 4. Hold this position for 5 seconds while you keep your back relaxed. 5. Slowly return to lying flat on the floor. Bridges Repeat these steps 10 times: 1. Lie on your back on a firm surface. 2. Bend your knees so they are pointing toward the ceiling and your feet are flat on the floor. 3. Tighten your buttocks muscles and lift your buttocks off of the floor until your waist is at almost the same height as your knees. You should feel the muscles working in your buttocks and the back of your thighs. If you do not feel these muscles, slide your feet 1-2 inches farther away from your buttocks. 4. Hold this position for 3-5 seconds. 5. Slowly lower your hips to the starting position, and allow your buttocks muscles to relax completely. If this exercise is too easy, try doing it with your arms crossed over your chest. Abdominal Crunches Repeat these steps 5-10 times: 1. Lie on your back on a firm bed or the floor with your legs extended. 2. Bend your knees so they are pointing toward the ceiling and your feet are flat on the floor. 3. Cross your arms over your chest. 4. Tip your chin slightly toward your chest without bending your neck. 5. Tighten your abdominal muscles and slowly raise your trunk (torso) high enough to lift your shoulder blades a tiny bit off of the floor. Avoid raising your torso higher than that, because it can put too much stress on your low back and it does not help to strengthen your abdominal muscles. 6. Slowly return to your starting position. Back Lifts Repeat these steps 5-10 times: 1. Lie on your abdomen (face-down) with your arms at your sides, and rest your forehead on the floor. 2. Tighten the muscles in your legs and your buttocks. 3. Slowly lift your chest off of the floor while you keep your hips pressed to the floor. Keep the back of your head  in line with the curve in your back. Your eyes should be looking at the floor. 4. Hold this position for 3-5 seconds. 5. Slowly return to your starting position. SEEK MEDICAL CARE IF:  Your back pain or discomfort gets much worse when you do an exercise.  Your back pain or discomfort does not lessen within 2 hours after you exercise. If you  have any of these problems, stop doing these exercises right away. Do not do them again unless your health care provider says that you can. SEEK IMMEDIATE MEDICAL CARE IF:  You develop sudden, severe back pain. If this happens, stop doing the exercises right away. Do not do them again unless your health care provider says that you can.   This information is not intended to replace advice given to you by your health care provider. Make sure you discuss any questions you have with your health care provider.   Document Released: 07/16/2004 Document Revised: 02/27/2015 Document Reviewed: 08/02/2014 Elsevier Interactive Patient Education [employer].

## 2016-04-01 ENCOUNTER — Other Ambulatory Visit: Payer: Self-pay | Admitting: Internal Medicine

## 2016-04-01 DIAGNOSIS — Z1231 Encounter for screening mammogram for malignant neoplasm of breast: Secondary | ICD-10-CM

## 2016-04-10 ENCOUNTER — Ambulatory Visit: Payer: Medicaid Other

## 2016-04-13 ENCOUNTER — Ambulatory Visit: Payer: Medicaid Other | Admitting: Obstetrics & Gynecology

## 2016-04-13 ENCOUNTER — Telehealth: Payer: Self-pay | Admitting: Orthopaedic Surgery

## 2016-04-14 MED ORDER — HYDROCODONE-ACETAMINOPHEN 5-325 MG PO TABS: 1.0000 | ORAL_TABLET | Freq: Four times a day (QID) | ORAL | 0 refills | Status: DC | PRN

## 2016-04-20 ENCOUNTER — Encounter: Payer: Self-pay | Admitting: *Deleted

## 2016-04-20 ENCOUNTER — Ambulatory Visit: Payer: Medicaid Other | Admitting: Cardiology

## 2016-04-20 NOTE — Progress Notes (Deleted)
Cardiology Office Note   Date:  04/20/2016   ID:  Vanessa Bowen, DOB Oct 23, 1975, MRN [id number]  PCP:  Purvis Kilts, MD  Cardiologist:   Minus Breeding, MD  Referring:  ***  No chief complaint on file.     History of Present Illness: Vanessa Bowen is a 40 y.o. female who presents for ***  She was in the ED recently with edema in her legs.  I reviewed these records.  She also had a mildly EKG with non specific ST T wave changes.     Past Medical History:  Diagnosis Date  . Anxiety   . Arthritis   . Chronic back pain   . Colitis, ulcerative ([place])   . Depression   . Hypothyroidism     Past Surgical History:  Procedure Laterality Date  . ANKLE SURGERY    . breast augmentation    . CESAREAN SECTION    . KNEE SURGERY    . LAPAROSCOPIC BILATERAL SALPINGECTOMY  07/27/2012   Procedure: LAPAROSCOPIC BILATERAL SALPINGECTOMY;  Surgeon: Florian Buff, MD;  Location: [facility];  Service: Gynecology;  Laterality: N/A;  . OVARIAN CYST REMOVAL       Current Outpatient Prescriptions  Medication Sig Dispense Refill  . amitriptyline (ELAVIL) 50 MG tablet Take 100 mg by mouth at bedtime.  1  . clonazePAM (KLONOPIN) 0.5 MG tablet Take 1 tablet twice a day as needed for nerves.  1  . furosemide (LASIX) 20 MG tablet Take 20 mg by mouth daily as needed for fluid.     Marland Kitchen HYDROcodone-acetaminophen (NORCO/VICODIN) 5-325 MG tablet Take 1 tablet by mouth every 6 (six) hours as needed for moderate pain (Must last 14 days.Do not take and drive a car or use machinery.). 50 tablet 0  . ibuprofen (ADVIL,MOTRIN) 200 MG tablet Take 800 mg by mouth every 6 (six) hours as needed.    Marland Kitchen levothyroxine (SYNTHROID, LEVOTHROID) 137 MCG tablet Take 1 tablet (137 mcg total) by mouth daily before breakfast. 30 tablet 12  . Phentermine-Topiramate (QSYMIA) 7.5-46 MG CP24 Take 1 capsule by mouth daily. 30 capsule 2   No current facility-administered medications for this visit.     Allergies:   Doxycycline  and Sulfonamide derivatives    Social History:  The patient  reports that she has been smoking Cigarettes.  She has a 7.50 pack-year smoking history. She has never used smokeless tobacco. She reports that she drinks alcohol. She reports that she does not use drugs.   Family History:  The patient's ***family history includes Cancer in her maternal grandfather and maternal grandmother.    ROS:  Please see the history of present illness.   Otherwise, review of systems are positive for {NONE DEFAULTED:18576::"none"}.   All other systems are reviewed and negative.    PHYSICAL EXAM: VS:  There were no vitals taken for this visit. , BMI There is no height or weight on file to calculate BMI. GENERAL:  Well appearing HEENT:  Pupils equal round and reactive, fundi not visualized, oral mucosa unremarkable NECK:  No jugular venous distention, waveform within normal limits, carotid upstroke brisk and symmetric, no bruits, no thyromegaly LYMPHATICS:  No cervical, inguinal adenopathy LUNGS:  Clear to auscultation bilaterally BACK:  No CVA tenderness CHEST:  Unremarkable HEART:  PMI not displaced or sustained,S1 and S2 within normal limits, no S3, no S4, no clicks, no rubs, *** murmurs ABD:  Flat, positive bowel sounds normal in frequency in pitch, no bruits, no rebound,  no guarding, no midline pulsatile mass, no hepatomegaly, no splenomegaly EXT:  2 plus pulses throughout, no edema, no cyanosis no clubbing SKIN:  No rashes no nodules NEURO:  Cranial nerves II through XII grossly intact, motor grossly intact throughout PSYCH:  Cognitively intact, oriented to person place and time    EKG:  EKG {ACTION; IS/IS WGN:[id number]} ordered today. The ekg ordered today demonstrates ***   Recent Labs: 02/06/2016: ALT 14; B Natriuretic Peptide 26.0; BUN 16; Creatinine, Ser 0.90; Hemoglobin 12.0; Platelets 362; Potassium 3.7; Sodium 137 03/12/2016: TSH 0.16    Lipid Panel No results found for: CHOL, TRIG, HDL,  CHOLHDL, VLDL, LDLCALC, LDLDIRECT    Wt Readings from Last 3 Encounters:  03/31/16 182 lb (82.6 kg)  03/18/16 186 lb (84.4 kg)  02/06/16 182 lb (82.6 kg)      Other studies Reviewed: Additional studies/ records that were reviewed today include: ***. Review of the above records demonstrates:  Please see elsewhere in the note.  ***   ASSESSMENT AND PLAN:  ***   Current medicines are reviewed at length with the patient today.  The patient {ACTIONS; HAS/DOES NOT HAVE:19233} concerns regarding medicines.  The following changes have been made:  {PLAN; NO CHANGE:13088:s}  Labs/ tests ordered today include: *** No orders of the defined types were placed in this encounter.    Disposition:   FU with ***    Signed, Minus Breeding, MD  04/20/2016 8:11 AM    [place]

## 2016-04-21 ENCOUNTER — Ambulatory Visit: Payer: Medicaid Other | Admitting: Cardiology

## 2016-04-21 ENCOUNTER — Encounter: Payer: Self-pay | Admitting: Cardiology

## 2016-04-22 ENCOUNTER — Ambulatory Visit: Payer: Medicaid Other

## 2016-04-27 ENCOUNTER — Telehealth: Payer: Self-pay | Admitting: Orthopaedic Surgery

## 2016-04-28 MED ORDER — HYDROCODONE-ACETAMINOPHEN 5-325 MG PO TABS: 1.0000 | ORAL_TABLET | Freq: Four times a day (QID) | ORAL | 0 refills | Status: DC | PRN

## 2016-05-05 ENCOUNTER — Other Ambulatory Visit: Payer: Self-pay

## 2016-05-05 MED ORDER — LEVOTHYROXINE SODIUM 137 MCG PO TABS: 137.0000 ug | ORAL_TABLET | Freq: Every day | ORAL | 5 refills | Status: DC

## 2016-05-12 ENCOUNTER — Telehealth: Payer: Self-pay | Admitting: Orthopaedic Surgery

## 2016-05-12 MED ORDER — HYDROCODONE-ACETAMINOPHEN 5-325 MG PO TABS: 1.0000 | ORAL_TABLET | Freq: Four times a day (QID) | ORAL | 0 refills | Status: DC | PRN

## 2016-05-20 ENCOUNTER — Encounter (HOSPITAL_COMMUNITY): Payer: Self-pay | Admitting: *Deleted

## 2016-05-20 ENCOUNTER — Emergency Department (HOSPITAL_COMMUNITY)
Admission: EM | Admit: 2016-05-20 | Discharge: 2016-05-20 | Disposition: A | Discharge status: Home / Self Care | Payer: BLUE CROSS/BLUE SHIELD | Attending: Dermatology | Admitting: Dermatology

## 2016-05-20 DIAGNOSIS — Z79899 Other long term (current) drug therapy: Secondary | ICD-10-CM | POA: Insufficient documentation

## 2016-05-20 DIAGNOSIS — F17210 Nicotine dependence, cigarettes, uncomplicated: Secondary | ICD-10-CM | POA: Diagnosis not present

## 2016-05-20 DIAGNOSIS — J029 Acute pharyngitis, unspecified: Secondary | ICD-10-CM | POA: Diagnosis not present

## 2016-05-20 DIAGNOSIS — Z5321 Procedure and treatment not carried out due to patient leaving prior to being seen by health care provider: Secondary | ICD-10-CM | POA: Diagnosis not present

## 2016-05-20 DIAGNOSIS — R52 Pain, unspecified: Secondary | ICD-10-CM | POA: Insufficient documentation

## 2016-05-20 DIAGNOSIS — R112 Nausea with vomiting, unspecified: Secondary | ICD-10-CM | POA: Diagnosis not present

## 2016-05-20 DIAGNOSIS — R197 Diarrhea, unspecified: Secondary | ICD-10-CM | POA: Insufficient documentation

## 2016-05-20 DIAGNOSIS — E039 Hypothyroidism, unspecified: Secondary | ICD-10-CM | POA: Diagnosis not present

## 2016-05-20 LAB — PREGNANCY, URINE: PREG TEST UR: NEGATIVE

## 2016-05-20 LAB — URINALYSIS, ROUTINE W REFLEX MICROSCOPIC
Bilirubin Urine: NEGATIVE
Glucose, UA: NEGATIVE mg/dL
Hgb urine dipstick: NEGATIVE
Ketones, ur: NEGATIVE mg/dL
LEUKOCYTES UA: NEGATIVE
NITRITE: NEGATIVE
PH: 6 (ref 5.0–8.0)
Protein, ur: NEGATIVE mg/dL
SPECIFIC GRAVITY, URINE: 1.015 (ref 1.005–1.030)

## 2016-05-20 NOTE — ED Notes (Signed)
No answer in waiting room, lab also called pt several times with no answer,

## 2016-05-20 NOTE — ED Triage Notes (Signed)
Pt c/o generalized body aches x 3 days; pt states she has sore throat, with diarrhea, chills, n/v and abdominal cramps

## 2016-05-20 NOTE — ED Notes (Signed)
No answer in waiting room X1,

## 2016-05-25 ENCOUNTER — Telehealth: Payer: Self-pay | Admitting: Orthopaedic Surgery

## 2016-05-26 MED ORDER — HYDROCODONE-ACETAMINOPHEN 5-325 MG PO TABS: 1.0000 | ORAL_TABLET | Freq: Four times a day (QID) | ORAL | 0 refills | Status: DC | PRN

## 2016-06-08 ENCOUNTER — Telehealth: Payer: Self-pay | Admitting: Orthopaedic Surgery

## 2016-06-09 MED ORDER — HYDROCODONE-ACETAMINOPHEN 5-325 MG PO TABS: 1.0000 | ORAL_TABLET | Freq: Four times a day (QID) | ORAL | 0 refills | Status: DC | PRN

## 2016-06-22 ENCOUNTER — Telehealth: Payer: Self-pay | Admitting: Orthopaedic Surgery

## 2016-06-23 ENCOUNTER — Ambulatory Visit: Payer: Medicaid Other | Admitting: "Endocrinology

## 2016-06-23 MED ORDER — HYDROCODONE-ACETAMINOPHEN 5-325 MG PO TABS: 1.0000 | ORAL_TABLET | Freq: Four times a day (QID) | ORAL | 0 refills | Status: DC | PRN

## 2016-07-01 ENCOUNTER — Ambulatory Visit: Payer: Medicaid Other | Admitting: Orthopaedic Surgery

## 2016-07-07 ENCOUNTER — Other Ambulatory Visit: Payer: Self-pay | Admitting: Orthopaedic Surgery

## 2016-07-09 ENCOUNTER — Ambulatory Visit: Payer: Medicaid Other | Admitting: Orthopaedic Surgery

## 2016-07-14 ENCOUNTER — Encounter: Payer: Self-pay | Admitting: Orthopaedic Surgery

## 2016-07-14 ENCOUNTER — Ambulatory Visit (INDEPENDENT_AMBULATORY_CARE_PROVIDER_SITE_OTHER): Payer: BLUE CROSS/BLUE SHIELD | Admitting: Orthopaedic Surgery

## 2016-07-14 VITALS — BP 119/82 | HR 112 | Temp 98.1°F | Ht 63.0 in | Wt 182.0 lb

## 2016-07-14 DIAGNOSIS — F17210 Nicotine dependence, cigarettes, uncomplicated: Secondary | ICD-10-CM

## 2016-07-14 DIAGNOSIS — G8929 Other chronic pain: Secondary | ICD-10-CM

## 2016-07-14 DIAGNOSIS — K5190 Ulcerative colitis, unspecified, without complications: Secondary | ICD-10-CM

## 2016-07-14 DIAGNOSIS — M545 Low back pain: Secondary | ICD-10-CM | POA: Diagnosis not present

## 2016-07-14 MED ORDER — HYDROCODONE-ACETAMINOPHEN 5-325 MG PO TABS: 1.0000 | ORAL_TABLET | Freq: Four times a day (QID) | ORAL | 0 refills | Status: DC | PRN

## 2016-07-14 NOTE — Patient Instructions (Addendum)
Handicap DMV form filled out.       Steps to Quit Smoking Smoking tobacco can be bad for your health. It can also affect almost every organ in your body. Smoking puts you and people around you at risk for many serious long-lasting (chronic) diseases. Quitting smoking is hard, but it is one of the best things that you can do for your health. It is never too late to quit. What are the benefits of quitting smoking? When you quit smoking, you lower your risk for getting serious diseases and conditions. They can include:  Lung cancer or lung disease.  Heart disease.  Stroke.  Heart attack.  Not being able to have children (infertility).  Weak bones (osteoporosis) and broken bones (fractures). If you have coughing, wheezing, and shortness of breath, those symptoms may get better when you quit. You may also get sick less often. If you are pregnant, quitting smoking can help to lower your chances of having a baby of low birth weight. What can I do to help me quit smoking? Talk with your doctor about what can help you quit smoking. Some things you can do (strategies) include:  Quitting smoking totally, instead of slowly cutting back how much you smoke over a period of time.  Going to in-person counseling. You are more likely to quit if you go to many counseling sessions.  Using resources and support systems, such as:  Online chats with a Social worker.  Phone quitlines.  Printed Furniture conservator/restorer.  Support groups or group counseling.  Text messaging programs.  Mobile phone apps or applications.  Taking medicines. Some of these medicines may have nicotine in them. If you are pregnant or breastfeeding, do not take any medicines to quit smoking unless your doctor says it is okay. Talk with your doctor about counseling or other things that can help you. Talk with your doctor about using more than one strategy at the same time, such as taking medicines while you are also going to  in-person counseling. This can help make quitting easier. What things can I do to make it easier to quit? Quitting smoking might feel very hard at first, but there is a lot that you can do to make it easier. Take these steps:  Talk to your family and friends. Ask them to support and encourage you.  Call phone quitlines, reach out to support groups, or work with a Social worker.  Ask people who smoke to not smoke around you.  Avoid places that make you want (trigger) to smoke, such as:  Bars.  Parties.  Smoke-break areas at work.  Spend time with people who do not smoke.  Lower the stress in your life. Stress can make you want to smoke. Try these things to help your stress:  Getting regular exercise.  Deep-breathing exercises.  Yoga.  Meditating.  Doing a body scan. To do this, close your eyes, focus on one area of your body at a time from head to toe, and notice which parts of your body are tense. Try to relax the muscles in those areas.  Download or buy apps on your mobile phone or tablet that can help you stick to your quit plan. There are many free apps, such as QuitGuide from the [employer] Office manager for Disease Control and Prevention). You can find more support from smokefree.gov and other websites. This information is not intended to replace advice given to you by your health care provider. Make sure you discuss any questions you have with  your health care provider. Document Released: 04/04/2009 Document Revised: 02/04/2016 Document Reviewed: 10/23/2014 Elsevier Interactive Patient Education  2017 [employer].

## 2016-07-14 NOTE — Progress Notes (Signed)
Vanessa Bowen, female DOB:June 09, 1976, 41 y.o. SVX:[id number]  Chief Complaint  Vanessa presents with  . Back Pain    HPI  Vanessa Bowen is a 41 y.o. female who has chronic lower back pain with no paresthesias.  She has ulcerative colitis and cannot take any NSAID.  She has more pain with cold weather.  She is working regularly.  She has no new trauma, no weakness.  She cannot take time off now for PT.  She is still smoking but has cut back a whole lot. HPI  Body mass index is 32.24 kg/m.  ROS  Review of Systems  HENT: Negative for congestion.   Respiratory: Negative for cough and shortness of breath.   Cardiovascular: Negative for chest pain and leg swelling.  Endocrine: Positive for cold intolerance.  Musculoskeletal: Positive for arthralgias and back pain.  Allergic/Immunologic: Positive for environmental allergies.    Past Medical History:  Diagnosis Date  . Anxiety   . Arthritis   . Chronic back pain   . Colitis, ulcerative ([place])   . Depression   . Hypothyroidism   . Migraine     Past Surgical History:  Procedure Laterality Date  . ANKLE SURGERY    . breast augmentation    . CESAREAN SECTION    . KNEE SURGERY    . LAPAROSCOPIC BILATERAL SALPINGECTOMY  07/27/2012   Procedure: LAPAROSCOPIC BILATERAL SALPINGECTOMY;  Surgeon: Florian Buff, MD;  Location: [facility];  Service: Gynecology;  Laterality: N/A;  . OVARIAN CYST REMOVAL      Family History  Problem Relation Age of Onset  . Cancer Maternal Grandmother   . Cancer Maternal Grandfather     Social History Social History  Substance Use Topics  . Smoking status: Current Every Day Smoker    Packs/day: 0.50    Years: 15.00    Types: Cigarettes  . Smokeless tobacco: Never Used  . Alcohol use Yes     Comment: occasionally    Allergies  Allergen Reactions  . Doxycycline Nausea And Vomiting  . Sulfonamide Derivatives Nausea And Vomiting    Current Outpatient Prescriptions  Medication Sig  Dispense Refill  . amitriptyline (ELAVIL) 50 MG tablet Take 100 mg by mouth at bedtime.  1  . chlorproMAZINE (THORAZINE) 10 MG tablet Take 10 mg by mouth daily.    . clonazePAM (KLONOPIN) 0.5 MG tablet Take 1 tablet twice a day as needed for nerves.  1  . furosemide (LASIX) 20 MG tablet Take 20 mg by mouth daily as needed for fluid.     Marland Kitchen HYDROcodone-acetaminophen (NORCO/VICODIN) 5-325 MG tablet Take 1 tablet by mouth every 6 (six) hours as needed for moderate pain (Must last 30 days.Do not take and drive a car or use machinery.). 120 tablet 0  . ibuprofen (ADVIL,MOTRIN) 200 MG tablet Take 800 mg by mouth every 6 (six) hours as needed.    Marland Kitchen levothyroxine (SYNTHROID, LEVOTHROID) 137 MCG tablet Take 1 tablet (137 mcg total) by mouth daily before breakfast. 30 tablet 5  . Phentermine-Topiramate (QSYMIA) 7.5-46 MG CP24 Take 1 capsule by mouth daily. 30 capsule 2  . topiramate (TOPAMAX) 25 MG tablet Take 25 mg by mouth 2 (two) times daily.     No current facility-administered medications for this visit.      Physical Exam  Blood pressure 119/82, pulse (!) 112, temperature 98.1 F (36.7 C), height 5' 3"  (1.6 m), weight 182 lb (82.6 kg).  Constitutional: overall normal hygiene, normal nutrition, well developed, normal grooming,  normal body habitus. Assistive device:none  Musculoskeletal: gait and station Limp none, muscle tone and strength are normal, no tremors or atrophy is present.  .  Neurological: coordination overall normal.  Deep tendon reflex/nerve stretch intact.  Sensation normal.  Cranial nerves II-XII intact.   Skin:   Normal overall no scars, lesions, ulcers or rashes. No psoriasis.  Psychiatric: Alert and oriented x 3.  Recent memory intact, remote memory unclear.  Normal mood and affect. Well groomed.  Good eye contact.  Cardiovascular: overall no swelling, no varicosities, no edema bilaterally, normal temperatures of the legs and arms, no clubbing, cyanosis and good  capillary refill.  Lymphatic: palpation is normal. Spine/Pelvis examination:  Inspection:  Overall, sacoiliac joint benign and hips nontender; without crepitus or defects.   Thoracic spine inspection: Alignment normal without kyphosis present   Lumbar spine inspection:  Alignment  with normal lumbar lordosis, without scoliosis apparent.   Thoracic spine palpation:  without tenderness of spinal processes   Lumbar spine palpation: with tenderness of lumbar area; without tightness of lumbar muscles    Range of Motion:   Lumbar flexion, forward flexion is 35 without pain or tenderness    Lumbar extension is 10 without pain or tenderness   Left lateral bend is Normal  without pain or tenderness   Right lateral bend is Normal without pain or tenderness   Straight leg raising is Normal   Strength & tone: Normal   Stability overall normal stability      The Vanessa has been educated about the nature of the problem(s) and counseled on treatment options.  The Vanessa appeared to understand what I have discussed and is in agreement with it.  Encounter Diagnoses  Name Primary?  . Chronic midline low back pain without sciatica Yes  . Ulcerative colitis without complications, unspecified location ([place])   . Cigarette nicotine dependence without complication     PLAN Call if any problems.  Precautions discussed.  Continue current medications.   Return to clinic 3 months I have given pain medicine after looking up the state web site.  Continue her exercises at home.  Electronically Signed Sanjuana Kava, MD 1/23/20184:33 PM

## 2016-07-21 ENCOUNTER — Other Ambulatory Visit: Payer: Self-pay | Admitting: "Endocrinology

## 2016-07-21 LAB — COMPREHENSIVE METABOLIC PANEL
ALK PHOS: 75 U/L (ref 33–115)
ALT: 22 U/L (ref 6–29)
AST: 20 U/L (ref 10–30)
Albumin: 4 g/dL (ref 3.6–5.1)
BUN: 18 mg/dL (ref 7–25)
CO2: 24 mmol/L (ref 20–31)
CREATININE: 1.1 mg/dL (ref 0.50–1.10)
Calcium: 9.4 mg/dL (ref 8.6–10.2)
Chloride: 105 mmol/L (ref 98–110)
Glucose, Bld: 93 mg/dL (ref 65–99)
Potassium: 4.2 mmol/L (ref 3.5–5.3)
SODIUM: 139 mmol/L (ref 135–146)
TOTAL PROTEIN: 6.6 g/dL (ref 6.1–8.1)
Total Bilirubin: 0.3 mg/dL (ref 0.2–1.2)

## 2016-07-21 LAB — TSH: TSH: 0.07 m[IU]/L — AB

## 2016-07-21 LAB — T4, FREE: Free T4: 1.4 ng/dL (ref 0.8–1.8)

## 2016-07-22 LAB — HEMOGLOBIN A1C
Hgb A1c MFr Bld: 5.1 % (ref <5.7)
MEAN PLASMA GLUCOSE: 100 mg/dL

## 2016-08-10 ENCOUNTER — Encounter: Payer: Self-pay | Admitting: Obstetrics & Gynecology

## 2016-08-10 ENCOUNTER — Ambulatory Visit (INDEPENDENT_AMBULATORY_CARE_PROVIDER_SITE_OTHER): Payer: BLUE CROSS/BLUE SHIELD | Admitting: Obstetrics & Gynecology

## 2016-08-10 VITALS — BP 90/60 | HR 72 | Ht 63.0 in | Wt 186.0 lb

## 2016-08-10 DIAGNOSIS — N941 Unspecified dyspareunia: Secondary | ICD-10-CM | POA: Diagnosis not present

## 2016-08-10 DIAGNOSIS — N946 Dysmenorrhea, unspecified: Secondary | ICD-10-CM | POA: Diagnosis not present

## 2016-08-10 DIAGNOSIS — N920 Excessive and frequent menstruation with regular cycle: Secondary | ICD-10-CM | POA: Diagnosis not present

## 2016-08-10 DIAGNOSIS — R350 Frequency of micturition: Secondary | ICD-10-CM

## 2016-08-10 LAB — POCT URINALYSIS DIPSTICK
Blood, UA: NEGATIVE
KETONES UA: NEGATIVE
Leukocytes, UA: NEGATIVE
PROTEIN UA: NEGATIVE

## 2016-08-10 MED ORDER — NITROFURANTOIN MONOHYD MACRO 100 MG PO CAPS: 100.0000 mg | ORAL_CAPSULE | Freq: Two times a day (BID) | ORAL | 0 refills | Status: DC

## 2016-08-10 NOTE — Progress Notes (Signed)
Chief Complaint  Patient presents with  . discuss hysterectomy    c/o burning/ frequency urination    Blood pressure 90/60, pulse 72, height 5' 3"  (1.6 m), weight 186 lb (84.4 kg), last menstrual period 07/24/2016.  41 y.o. No obstetric history on file. Patient's last menstrual period was 07/24/2016. The current method of family planning is tubal ligation.  Outpatient Encounter Prescriptions as of 08/10/2016  Medication Sig  . amitriptyline (ELAVIL) 50 MG tablet Take 100 mg by mouth at bedtime.  . clonazePAM (KLONOPIN) 0.5 MG tablet Take 1 tablet twice a day as needed for nerves.  . cloNIDine (CATAPRES) 0.1 MG tablet Take 0.1 mg by mouth 2 (two) times daily.  Marland Kitchen HYDROcodone-acetaminophen (NORCO/VICODIN) 5-325 MG tablet Take 1 tablet by mouth every 6 (six) hours as needed for moderate pain (Must last 30 days.Do not take and drive a car or use machinery.).  Marland Kitchen levothyroxine (SYNTHROID, LEVOTHROID) 137 MCG tablet Take 1 tablet (137 mcg total) by mouth daily before breakfast.  . Phenazopyridine HCl (URISTAT PO) Take by mouth.  . topiramate (TOPAMAX) 25 MG tablet Take 25 mg by mouth 2 (two) times daily.  . nitrofurantoin, macrocrystal-monohydrate, (MACROBID) 100 MG capsule Take 1 capsule (100 mg total) by mouth 2 (two) times daily.  . [DISCONTINUED] chlorproMAZINE (THORAZINE) 10 MG tablet Take 10 mg by mouth daily.  . [DISCONTINUED] furosemide (LASIX) 20 MG tablet Take 20 mg by mouth daily as needed for fluid.   . [DISCONTINUED] ibuprofen (ADVIL,MOTRIN) 200 MG tablet Take 800 mg by mouth every 6 (six) hours as needed.  . [DISCONTINUED] Phentermine-Topiramate (QSYMIA) 7.5-46 MG CP24 Take 1 capsule by mouth daily.   No facility-administered encounter medications on file as of 08/10/2016.     Subjective Patient is having increasingly intolerable menstrual periods They're heavy clots with lots of cramping and pain almost can't function for several days a month She has pain with  intercourse every time She also has quite a bit of emotional lability during the month All of her pain is midline she has no left or right third pelvic pain She has a history of 1 C-section after vaginal delivery Her urinalysis today was negative  Objective   Pertinent ROS No burning with urination, frequency or urgency No nausea, vomiting or diarrhea Nor fever chills or other constitutional symptoms   Labs or studies     Impression Diagnoses this Encounter::   ICD-9-CM ICD-10-CM   1. Frequent urination 788.41 R35.0 Urine culture     POCT urinalysis dipstick  2. Dysmenorrhea 625.3 N94.6 US Pelvis Complete     US Transvaginal Non-OB  3. Dyspareunia, female 625.0 N94.10 US Pelvis Complete     US Transvaginal Non-OB    Established relevant diagnosis(es):   Plan/Recommendations: Meds ordered this encounter  Medications  . cloNIDine (CATAPRES) 0.1 MG tablet    Sig: Take 0.1 mg by mouth 2 (two) times daily.  . Phenazopyridine HCl (URISTAT PO)    Sig: Take by mouth.  . nitrofurantoin, macrocrystal-monohydrate, (MACROBID) 100 MG capsule    Sig: Take 1 capsule (100 mg total) by mouth 2 (two) times daily.    Dispense:  14 capsule    Refill:  0    Labs or Scans Ordered: Orders Placed This Encounter  Procedures  . Urine culture  . US Pelvis Complete  . US Transvaginal Non-OB  . POCT urinalysis dipstick    Management:: With her history of dyspareunia 100% of time in with menorrhagia and dysmenorrhea  she is considering hysterectomy I would like to do an ultrasound prior to evaluate the anatomy She will contemplate her options as far as her ovaries and tubes were concerned  Follow up Return in about 1 week (around 08/17/2016) for GYN sono, Follow up, with Dr Elonda Husky.        Face to face time:  15 minutes  Greater than 50% of the visit time was spent in counseling and coordination of care with the patient.  The summary and outline of the counseling and care  coordination is summarized in the note above.   All questions were answered.  Past Medical History:  Diagnosis Date  . Anxiety   . Arthritis   . Chronic back pain   . Colitis, ulcerative ([place])   . Depression   . Hypothyroidism   . Migraine     Past Surgical History:  Procedure Laterality Date  . ANKLE SURGERY    . breast augmentation    . CESAREAN SECTION    . KNEE SURGERY    . LAPAROSCOPIC BILATERAL SALPINGECTOMY  07/27/2012   Procedure: LAPAROSCOPIC BILATERAL SALPINGECTOMY;  Surgeon: Florian Buff, MD;  Location: [facility];  Service: Gynecology;  Laterality: N/A;  . OVARIAN CYST REMOVAL      OB History    No data available      Allergies  Allergen Reactions  . Doxycycline Nausea And Vomiting  . Sulfonamide Derivatives Nausea And Vomiting    Social History   Social History  . Marital status: Married    Spouse name: N/A  . Number of children: N/A  . Years of education: N/A   Social History Main Topics  . Smoking status: Current Every Day Smoker    Packs/day: 0.50    Years: 15.00    Types: Cigarettes  . Smokeless tobacco: Never Used  . Alcohol use Yes     Comment: occasionally  . Drug use: No  . Sexual activity: Not Asked   Other Topics Concern  . None   Social History Narrative  . None    Family History  Problem Relation Age of Onset  . Cancer Maternal Grandmother   . Cancer Maternal Grandfather

## 2016-08-12 ENCOUNTER — Telehealth: Payer: Self-pay | Admitting: Orthopaedic Surgery

## 2016-08-12 LAB — URINE CULTURE

## 2016-08-13 ENCOUNTER — Ambulatory Visit: Payer: Medicaid Other | Admitting: "Endocrinology

## 2016-08-13 MED ORDER — HYDROCODONE-ACETAMINOPHEN 5-325 MG PO TABS: 1.0000 | ORAL_TABLET | Freq: Four times a day (QID) | ORAL | 0 refills | Status: DC | PRN

## 2016-08-17 ENCOUNTER — Ambulatory Visit (INDEPENDENT_AMBULATORY_CARE_PROVIDER_SITE_OTHER): Payer: BLUE CROSS/BLUE SHIELD

## 2016-08-17 ENCOUNTER — Ambulatory Visit (INDEPENDENT_AMBULATORY_CARE_PROVIDER_SITE_OTHER): Payer: BLUE CROSS/BLUE SHIELD | Admitting: Obstetrics & Gynecology

## 2016-08-17 ENCOUNTER — Encounter: Payer: Self-pay | Admitting: Obstetrics & Gynecology

## 2016-08-17 VITALS — BP 98/60 | HR 78 | Wt 191.0 lb

## 2016-08-17 DIAGNOSIS — N946 Dysmenorrhea, unspecified: Secondary | ICD-10-CM | POA: Diagnosis not present

## 2016-08-17 DIAGNOSIS — R1031 Right lower quadrant pain: Secondary | ICD-10-CM

## 2016-08-17 DIAGNOSIS — N941 Unspecified dyspareunia: Secondary | ICD-10-CM | POA: Diagnosis not present

## 2016-08-17 DIAGNOSIS — N854 Malposition of uterus: Secondary | ICD-10-CM

## 2016-08-17 NOTE — Progress Notes (Signed)
PELVIC US TA/TV: homogeneous anteverted uterus, wnl,normal ov's bilat,ovaries appear mobile,EEC 9.2 mm w/ multiple echo genic foci w/in the endometrium,no free fluid,no pain during ultrasound

## 2016-08-17 NOTE — Progress Notes (Signed)
Preoperative History and Physical  Vanessa Bowen a 41 y.o.No obstetric history on file. with Patient's last menstrual period was 07/24/2016.admitted for a [facility] RSO left salpingectomy.  Patient Bowen having increasingly intolerable menstrual periods They're heavy clots with lots of cramping and pain almost can't function for several days a month She has pain with intercourse every time She also has quite a bit of emotional lability during the month All of her pain Bowen midline she has no left or right third pelvic pain She has a history of 1 C-section after vaginal delivery Her urinalysis today was negative  PMH:      Past Medical History:  Diagnosis Date  . Anxiety   . Arthritis   . Chronic back pain   . Colitis, ulcerative ([place])   . Depression   . Hypothyroidism   . Migraine     PSH:       Past Surgical History:  Procedure Laterality Date  . ANKLE SURGERY    . breast augmentation    . CESAREAN SECTION    . KNEE SURGERY    . LAPAROSCOPIC BILATERAL SALPINGECTOMY  07/27/2012   Procedure: LAPAROSCOPIC BILATERAL SALPINGECTOMY; Surgeon: Florian Buff, MD; Location: [facility]; Service: Gynecology; Laterality: N/A;  . OVARIAN CYST REMOVAL      POb/GynH:     OB History   No data available      SH:        Social History  Substance Use Topics  . Smoking status: Current Every Day Smoker    Packs/day: 0.50    Years: 15.00    Types: Cigarettes  . Smokeless tobacco: Never Used  . Alcohol use Yes      Comment: occasionally    FH:       Family History  Problem Relation Age of Onset  . Cancer Maternal Grandmother   . Cancer Maternal Grandfather      Allergies:     Allergies  Allergen Reactions  . Doxycycline Nausea And Vomiting  . Sulfonamide Derivatives Nausea And Vomiting    Medications:  Current Outpatient Prescriptions:  . amitriptyline (ELAVIL) 50 MG tablet,  Take 100 mg by mouth at bedtime., Disp: , Rfl: 1 . clonazePAM (KLONOPIN) 0.5 MG tablet, Take 1 tablet twice a day as needed for nerves., Disp: , Rfl: 1 . cloNIDine (CATAPRES) 0.1 MG tablet, Take 0.1 mg by mouth 2 (two) times daily., Disp: , Rfl:  . HYDROcodone-acetaminophen (NORCO/VICODIN) 5-325 MG tablet, Take 1 tablet by mouth every 6 (six) hours as needed for moderate pain (Must last 30 days.Do not take and drive a car or use machinery.)., Disp: 100 tablet, Rfl: 0 . levothyroxine (SYNTHROID, LEVOTHROID) 137 MCG tablet, Take 1 tablet (137 mcg total) by mouth daily before breakfast., Disp: 30 tablet, Rfl: 5 . nitrofurantoin, macrocrystal-monohydrate, (MACROBID) 100 MG capsule, Take 1 capsule (100 mg total) by mouth 2 (two) times daily., Disp: 14 capsule, Rfl: 0 . Phenazopyridine HCl (URISTAT PO), Take by mouth., Disp: , Rfl:  . topiramate (TOPAMAX) 25 MG tablet, Take 25 mg by mouth 2 (two) times daily., Disp: , Rfl:   Review of Systems:  Review of Systems  Constitutional: Negative for fever, chills, weight loss, malaise/fatigue and diaphoresis.  HENT: Negative for hearing loss, ear pain, nosebleeds, congestion, sore throat, neck pain, tinnitus and ear discharge.  Eyes: Negative for blurred vision, double vision, photophobia, pain, discharge and redness.  Respiratory: Negative for cough, hemoptysis, sputum production, shortness of breath, wheezing and stridor.  Cardiovascular:  Negative for chest pain, palpitations, orthopnea, claudication, leg swelling and PND.  Gastrointestinal: Positive for abdominal pain. Negative for heartburn, nausea, vomiting, diarrhea, constipation, blood in stool and melena.  Genitourinary: Negative for dysuria, urgency, frequency, hematuria and flank pain.  Musculoskeletal: Negative for myalgias, back pain, joint pain and falls.  Skin: Negative for itching and rash.  Neurological: Negative for dizziness, tingling, tremors, sensory change, speech change,  focal weakness, seizures, loss of consciousness, weakness and headaches.  Endo/Heme/Allergies: Negative for environmental allergies and polydipsia. Does not bruise/bleed easily.  Psychiatric/Behavioral: Negative for depression, suicidal ideas, hallucinations, memory loss and substance abuse. The patient Bowen not nervous/anxious and does not have insomnia.     PHYSICAL EXAM:  Blood pressure 98/60, pulse 78, weight 191 lb (86.6 kg), last menstrual period 07/24/2016.   Vitals reviewed. Constitutional: She Bowen oriented to person, place, and time. She appears well-developed and well-nourished.  HENT:  Head: Normocephalic and atraumatic.  Right Ear: External ear normal.  Left Ear: External ear normal.  Nose: Nose normal.  Mouth/Throat: Oropharynx Bowen clear and moist.  Eyes: Conjunctivae and EOM are normal. Pupils are equal, round, and reactive to light. Right eye exhibits no discharge. Left eye exhibits no discharge. No scleral icterus.  Neck: Normal range of motion. Neck supple. No tracheal deviation present. No thyromegaly present.  Cardiovascular: Normal rate, regular rhythm, normal heart sounds and intact distal pulses. Exam reveals no gallop and no friction rub.  No murmur heard. Respiratory: Effort normal and breath sounds normal. No respiratory distress. She has no wheezes. She has no rales. She exhibits no tenderness.  GI: Soft. Bowel sounds are normal. She exhibits no distension and no mass. There Bowen tenderness. There Bowen no rebound and no guarding.  Genitourinary:  Vulva Bowen normal without lesions Vagina Bowen pink moist without discharge Cervix normal in appearance and pap Bowen normal Uterus Bowen normal size, contour, position, consistency, mobility, non-tender Adnexa Bowen negative with normal sized ovaries by sonogram Musculoskeletal: Normal range of motion. She exhibits no edema and no tenderness.  Neurological: She Bowen alert and oriented to person, place, and time. She has  normal reflexes. She displays normal reflexes. No cranial nerve deficit. She exhibits normal muscle tone. Coordination normal.  Skin: Skin Bowen warm and dry. No rash noted. No erythema. No pallor.  Psychiatric: She has a normal mood and affect. Her behavior Bowen normal. Judgment and thought content normal.    Labs:       Results for orders placed or performed during the hospital encounter of 09/04/16 (from the past 168 hour(s))  Type and screen   Collection Time: 09/04/16 12:45 PM  Result Value Ref Range   ABO/RH(D) O POS    Antibody Screen NEG    Sample Expiration 09/18/2016    Extend sample reason NO TRANSFUSIONS OR PREGNANCY IN THE PAST 3 MONTHS   CBC   Collection Time: 09/04/16 12:47 PM  Result Value Ref Range   WBC 8.9 4.0 - 10.5 K/uL   RBC 4.13 3.87 - 5.11 MIL/uL   Hemoglobin 12.8 12.0 - 15.0 g/dL   HCT 38.0 36.0 - 46.0 %   MCV 92.0 78.0 - 100.0 fL   MCH 31.0 26.0 - 34.0 pg   MCHC 33.7 30.0 - 36.0 g/dL   RDW 13.6 11.5 - 15.5 %   Platelets 386 150 - 400 K/uL  Comprehensive metabolic panel   Collection Time: 09/04/16 12:47 PM  Result Value Ref Range   Sodium 140 135 - 145 mmol/L  Potassium 4.2 3.5 - 5.1 mmol/L   Chloride 108 101 - 111 mmol/L   CO2 23 22 - 32 mmol/L   Glucose, Bld 80 65 - 99 mg/dL   BUN 18 6 - 20 mg/dL   Creatinine, Ser 0.99 0.44 - 1.00 mg/dL   Calcium 9.4 8.9 - 10.3 mg/dL   Total Protein 6.9 6.5 - 8.1 g/dL   Albumin 4.1 3.5 - 5.0 g/dL   AST 17 15 - 41 U/L   ALT 17 14 - 54 U/L   Alkaline Phosphatase 56 38 - 126 U/L   Total Bilirubin 0.6 0.3 - 1.2 mg/dL   GFR calc non Af Amer >60 >60 mL/min   GFR calc Af Amer >60 >60 mL/min   Anion gap 9 5 - 15  hCG, quantitative, pregnancy   Collection Time: 09/04/16 12:47 PM  Result Value Ref Range   hCG, Beta Chain, Quant, S 1 <5 mIU/mL  Rapid HIV screen (HIV 1/2 Ab+Ag)   Collection Time: 09/04/16 12:47 PM  Result Value Ref Range   HIV-1 P24 Antigen - HIV24 NON  REACTIVE NON REACTIVE   HIV 1/2 Antibodies NON REACTIVE NON REACTIVE   Interpretation (HIV Ag Ab)      A non reactive test result means that HIV 1 or HIV 2 antibodies and HIV 1 p24 antigen were not detected in the specimen.  Urinalysis, Routine w reflex microscopic   Collection Time: 09/04/16  1:11 PM  Result Value Ref Range   Color, Urine AMBER (A) YELLOW   APPearance HAZY (A) CLEAR   Specific Gravity, Urine 1.033 (Bowen) 1.005 - 1.030   pH 5.0 5.0 - 8.0   Glucose, UA NEGATIVE NEGATIVE mg/dL   Hgb urine dipstick NEGATIVE NEGATIVE   Bilirubin Urine SMALL (A) NEGATIVE   Ketones, ur NEGATIVE NEGATIVE mg/dL   Protein, ur 30 (A) NEGATIVE mg/dL   Nitrite NEGATIVE NEGATIVE   Leukocytes, UA NEGATIVE NEGATIVE   RBC / HPF 0-5 0 - 5 RBC/hpf   WBC, UA 0-5 0 - 5 WBC/hpf   Bacteria, UA NONE SEEN NONE SEEN   Mucous PRESENT       Imaging Studies:  ImagingResults  US Transvaginal Non-ob  Result Date: 08/17/2016 GYNECOLOGIC SONOGRAM Vanessa Bowen Bowen a 41 y.o. LMP 07/24/2016 she Bowen here for a pelvic sonogram for dysmenorrhea,dyspareunia. Uterus 7.9 x 4.3 x 3.5 cm, homogeneous anteverted uterus,wnl Endometrium 9.2 mm,asymmetrical, multiple echogenic foci w/in the endometrium Right ovary 2 x 1.6 x 1 cm, wnl Left ovary 2.3 x 1.5 x 1 cm, wnl No free fluid Technician Comments: PELVIC US TA/TV: homogeneous anteverted uterus, wnl,normal ov's bilat,ovaries appear mobile,EEC 9.2 mm w/ multiple echogenic foci w/in the endometrium,no free fluid,no pain during ultrasound [employer] 08/17/2016 3:40 PM Clinical Impression and recommendations: I have reviewed the sonogram results above, combined with the patient's current clinical course, below are my impressions and any appropriate recommendations for management based on the sonographic findings. Uterus Bowen normal with some focal calcifications of the endometrium, possibly small myoma  Both ovaries are normal Vanessa Bowen 08/17/2016 4:06 PM   US Pelvis Complete  Result Date: 08/17/2016 GYNECOLOGIC SONOGRAM Vanessa Bowen a 41 y.o. LMP 07/24/2016 she Bowen here for a pelvic sonogram for dysmenorrhea,dyspareunia. Uterus 7.9 x 4.3 x 3.5 cm, homogeneous anteverted uterus,wnl Endometrium 9.2 mm,asymmetrical, multiple echogenic foci w/in the endometrium Right ovary 2 x 1.6 x 1 cm, wnl Left ovary 2.3 x 1.5 x 1 cm, wnl No free fluid Technician Comments: PELVIC US  TA/TV: homogeneous anteverted uterus, wnl,normal ov's bilat,ovaries appear mobile,EEC 9.2 mm w/ multiple echogenic foci w/in the endometrium,no free fluid,no pain during ultrasound [employer] 08/17/2016 3:40 PM Clinical Impression and recommendations: I have reviewed the sonogram results above, combined with the patient's current clinical course, below are my impressions and any appropriate recommendations for management based on the sonographic findings. Uterus Bowen normal with some focal calcifications of the endometrium, possibly small myoma Both ovaries are normal Vanessa Bowen Bowen 08/17/2016 4:06 PM       Assessment: Dyspareunia, bump Dysmenorrhea RLQ pain     Patient Active Problem List   Diagnosis Date Noted  . Colitis, ulcerative ([place]) 03/31/2016  . Other specified hypothyroidism 03/18/2016  . WEIGHT LOSS, RECENT 09/23/2009  . NAUSEA WITH VOMITING 09/23/2009  . DIARRHEA, ACUTE 09/23/2009  . EPIGASTRIC PAIN 09/23/2009  . PUD, HX OF 09/23/2009  . ULCERATIVE COLITIS, HX OF 09/23/2009    Plan: Vaginal hysterectomy with removal of right tube and ovary, removal of left tube if possible, preserve the left ovary if possible  Vanessa Bowen 08/19/2016 4:23PM

## 2016-08-20 DIAGNOSIS — Z029 Encounter for administrative examinations, unspecified: Secondary | ICD-10-CM

## 2016-08-27 ENCOUNTER — Encounter: Payer: Self-pay | Admitting: Advanced Practice Midwife

## 2016-08-27 ENCOUNTER — Ambulatory Visit (INDEPENDENT_AMBULATORY_CARE_PROVIDER_SITE_OTHER): Payer: BLUE CROSS/BLUE SHIELD | Admitting: Advanced Practice Midwife

## 2016-08-27 ENCOUNTER — Encounter: Payer: Self-pay | Admitting: Obstetrics & Gynecology

## 2016-08-27 ENCOUNTER — Other Ambulatory Visit: Payer: Self-pay | Admitting: Obstetrics & Gynecology

## 2016-08-27 VITALS — BP 118/78 | HR 80 | Ht 63.0 in | Wt 184.0 lb

## 2016-08-27 DIAGNOSIS — N390 Urinary tract infection, site not specified: Secondary | ICD-10-CM | POA: Diagnosis not present

## 2016-08-27 DIAGNOSIS — R350 Frequency of micturition: Secondary | ICD-10-CM

## 2016-08-27 DIAGNOSIS — R300 Dysuria: Secondary | ICD-10-CM | POA: Diagnosis not present

## 2016-08-27 LAB — POCT URINALYSIS DIPSTICK
Glucose, UA: NEGATIVE
Ketones, UA: NEGATIVE
Leukocytes, UA: NEGATIVE
Nitrite, UA: NEGATIVE
RBC UA: NEGATIVE

## 2016-08-27 MED ORDER — CIPROFLOXACIN HCL 500 MG PO TABS: 500.0000 mg | ORAL_TABLET | Freq: Two times a day (BID) | ORAL | 0 refills | Status: DC

## 2016-08-27 MED ORDER — PHENAZOPYRIDINE HCL 200 MG PO TABS: 200.0000 mg | ORAL_TABLET | Freq: Three times a day (TID) | ORAL | 2 refills | Status: DC | PRN
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
## 2016-08-28 ENCOUNTER — Other Ambulatory Visit: Payer: Self-pay | Admitting: Obstetrics & Gynecology

## 2016-08-29 LAB — URINE CULTURE: Organism ID, Bacteria: NO GROWTH

## 2016-09-01 NOTE — Patient Instructions (Signed)
Vanessa Bowen  09/01/2016     @PREFPERIOPPHARMACY @   Your procedure is scheduled on  09/09/2016  Report to [facility] at  830   A.M.  Call this number if you have problems the morning of surgery:  [phone]   Remember:  Do not eat food or drink liquids after midnight.  Take these medicines the morning of surgery with A SIP OF WATER  Klonopin, catapress, hydrocodone, levothyroxine, pyridium, uristat, topamax.   Do not wear jewelry, make-up or nail polish.  Do not wear lotions, powders, or perfumes, or deoderant.  Do not shave 48 hours prior to surgery.  Men may shave face and neck.  Do not bring valuables to the hospital.  [facility] is not responsible for any belongings or valuables.  Contacts, dentures or bridgework may not be worn into surgery.  Leave your suitcase in the car.  After surgery it may be brought to your room.  For patients admitted to the hospital, discharge time will be determined by your treatment team.  Patients discharged the day of surgery will not be allowed to drive home.   Name and phone number of your driver:   family Special instructions:  none  Please read over the following fact sheets that you were given. Anesthesia Post-op Instructions and Care and Recovery After Surgery       Unilateral Salpingo-Oophorectomy Unilateral salpingo-oophorectomy is the surgical removal of one fallopian tube and ovary. The ovaries are small organs that produce eggs in women. The fallopian tubes transport the egg from the ovary to the womb (uterus). A unilateral salpingo-oophorectomy may be done for various reasons, including:  Infection in the fallopian tube and ovary.  Scar tissue in the fallopian tube and ovary (adhesions).  A cyst or tumor on the ovary.  A need to remove the fallopian tube and ovary when removing the uterus.  Cancer of the fallopian tube or ovary. The removal of one fallopian tube and ovary will not prevent you  from becoming pregnant, put you into menopause, or cause problems with your menstrual periods or sex drive. Tell a health care provider about:  Any allergies you have.  All medicines you are taking, including vitamins, herbs, eye drops, creams, and over-the-counter medicines.  Previous problems you or family members have had with the use of anesthetics.  Any blood disorders you have.  Previous surgeries you have had.  Any medical conditions you have. What are the risks? Generally, this is a safe procedure. However, as with any procedure, complications can occur. Possible complications include:  Injury to surrounding organs.  Bleeding.  Infection.  Blood clots in the legs or lungs.  Problems related to anesthesia. What happens before the procedure?  Ask your health care provider about changing or stopping your regular medicines. You may need to stop taking certain medicines, such as aspirin or blood thinners, at least 1 week before the surgery.  Do not eat or drink anything for at least 8 hours before the surgery.  If you smoke, do not smoke for at least 2 weeks before the surgery.  Make plans to have someone drive you home after the procedure or after your hospital stay. Also arrange for someone to help you with activities during recovery. What happens during the procedure?  You will be given medicine to help you relax before the procedure (sedative). You will then be given medicine to make you sleep through  the procedure (general anesthetic). These medicines will be given through an IV access tube that is put into one of your veins.  Once you are asleep, your lower abdomen will be shaved and cleaned. A thin, flexible tube (catheter) will be placed in your bladder.  The surgeon may use a laparoscopic, robotic, or open technique for this surgery:  In the laparoscopic technique, the surgery is done through two small cuts (incisions) in the abdomen. A thin, lighted tube with a  tiny camera on the end (laparoscope) is inserted into one of the incisions. The tools needed for the procedure are put through the other incision.  A robotic technique may be chosen to perform complex surgery in a small space. In the robotic technique, small incisions are made. A camera and surgical instruments are passed through the incisions. Surgical instruments are controlled with the help of a robotic arm.  In the open technique, the surgery is done through one large incision in the abdomen.  Using any of these techniques, the surgeon will remove the fallopian tube and ovary. The blood vessels will be clamped and tied.  The surgeon will then use staples or stitches to close the incision or incisions. What happens after the procedure?  You will be taken to a recovery area where your progress will be monitored for 1-3 hours. Your blood pressure, pulse, and temperature will be checked often. You will remain in the recovery area until you are stable and waking up.  If the laparoscopic technique was used, you may be allowed to go home after several hours. You may have some shoulder pain. This is normal and usually goes away in a day or two.  If the open technique was used, you will be admitted to the hospital for a couple of days.  You will be given pain medicine as necessary.  The IV tube and catheter will be removed before you are discharged. This information is not intended to replace advice given to you by your health care provider. Make sure you discuss any questions you have with your health care provider. Document Released: 04/05/2009 Document Revised: 05/08/2016 Document Reviewed: 11/30/2012 Elsevier Interactive Patient Education  2017 [employer]  Vaginal Hysterectomy A vaginal hysterectomy is a procedure to remove all or part of the uterus through a small incision in the vagina. In this procedure, your health care provider may remove your entire uterus, including the lower end  (cervix). You may need a vaginal hysterectomy to treat:  Uterine fibroids.  A condition that causes the lining of the uterus to grow in other areas (endometriosis).  Problems with pelvic support.  Cancer of the cervix, ovaries, uterus, or tissue that lines the uterus (endometrium).  Excessive (dysfunctional) uterine bleeding. When removing your uterus, your health care provider may also remove the organs that produce eggs (ovaries) and the tubes that carry eggs to your uterus (fallopian tubes). After a vaginal hysterectomy, you will no longer be able to have a baby. You will also no longer get your menstrual period. Tell a health care provider about:  Any allergies you have.  All medicines you are taking, including vitamins, herbs, eye drops, creams, and over-the-counter medicines.  Any problems you or family members have had with anesthetic medicines.  Any blood disorders you have.  Any surgeries you have had.  Any medical conditions you have.  Whether you are pregnant or may be pregnant. What are the risks? Generally, this is a safe procedure. However, problems may occur, including:  Bleeding.  Infection.  A blood clot that forms in your leg and travels to your lungs (pulmonary embolism).  Damage to surrounding organs.  Pain during sex. What happens before the procedure?  Ask your health care provider what organs will be removed during surgery.  Ask your health care provider about:  Changing or stopping your regular medicines. This is especially important if you are taking diabetes medicines or blood thinners.  Taking medicines such as aspirin and ibuprofen. These medicines can thin your blood. Do not take these medicines before your procedure if your health care provider instructs you not to.  Follow instructions from your health care provider about eating or drinking restrictions.  Do not use any tobacco products, such as cigarettes, chewing tobacco, and  e-cigarettes. If you need help quitting, ask your health care provider.  Plan to have someone take you home after discharge from the hospital. What happens during the procedure?  To reduce your risk of infection:  Your health care team will wash or sanitize their hands.  Your skin will be washed with soap.  An IV tube will be inserted into one of your veins.  You may be given antibiotic medicine to help prevent infection.  You will be given one or more of the following:  A medicine to help you relax (sedative).  A medicine to numb the area (local anesthetic).  A medicine to make you fall asleep (general anesthetic).  A medicine that is injected into an area of your body to numb everything beyond the injection site (regional anesthetic).  Your surgeon will make an incision in your vagina.  Your surgeon will locate and remove all or part of your uterus.  Your ovaries and fallopian tubes may be removed at the same time.  The incision will be closed with stitches (sutures) that dissolve over time. The procedure may vary among health care providers and hospitals. What happens after the procedure?  Your blood pressure, heart rate, breathing rate, and blood oxygen level will be monitored often until the medicines you were given have worn off.  You will be encouraged to get up and walk around after a few hours to help prevent complications.  You may have IV tubes in place for a few days.  You will be given pain medicine as needed.  Do not drive for 24 hours if you were given a sedative. This information is not intended to replace advice given to you by your health care provider. Make sure you discuss any questions you have with your health care provider. Document Released: 09/30/2015 Document Revised: 11/14/2015 Document Reviewed: 06/23/2015 Elsevier Interactive Patient Education  2017 [place].  Vaginal Hysterectomy, Care After Refer to this sheet in the next few weeks.  These instructions provide you with information about caring for yourself after your procedure. Your health care provider may also give you more specific instructions. Your treatment has been planned according to current medical practices, but problems sometimes occur. Call your health care provider if you have any problems or questions after your procedure. What can I expect after the procedure? After the procedure, it is common to have:  Pain.  Soreness and numbness in your incision areas.  Vaginal bleeding and discharge.  Constipation.  Temporary problems emptying the bladder.  Feelings of sadness or other emotions. Follow these instructions at home: Medicines   Take over-the-counter and prescription medicines only as told by your health care provider.  If you were prescribed an antibiotic medicine, take it  as told by your health care provider. Do not stop taking the antibiotic even if you start to feel better.  Do not drive or operate heavy machinery while taking prescription pain medicine. Activity   Return to your normal activities as told by your health care provider. Ask your health care provider what activities are safe for you.  Get regular exercise as told by your health care provider. You may be told to take short walks every day and go farther each time.  Do not lift anything that is heavier than 10 lb (4.5 kg). General instructions    Do not put anything in your vagina for 6 weeks after your surgery or as told by your health care provider. This includes tampons and douches.  Do not have sex until your health care provider says you can.  Do not take baths, swim, or use a hot tub until your health care provider approves.  Drink enough fluid to keep your urine clear or pale yellow.  Do not drive for 24 hours if you were given a sedative.  Keep all follow-up visits as told by your health care provider. This is important. Contact a health care provider if:  Your  pain medicine is not helping.  You have a fever.  You have redness, swelling, or pain at your incision site.  You have blood, pus, or a bad-smelling discharge from your vagina.  You continue to have difficulty urinating. Get help right away if:  You have severe abdominal or back pain.  You have heavy bleeding from your vagina.  You have chest pain or shortness of breath. This information is not intended to replace advice given to you by your health care provider. Make sure you discuss any questions you have with your health care provider. Document Released: 09/30/2015 Document Revised: 11/14/2015 Document Reviewed: 06/23/2015 Elsevier Interactive Patient Education  2017 [place].  Hysterectomy Information A hysterectomy is a surgery to remove your uterus. After surgery, you will no longer have periods. Also, you will not be able to get pregnant. Reasons for this surgery  You have bleeding that is not normal and keeps coming back.  You have lasting (chronic) lower belly (pelvic) pain.  You have a lasting infection.  The lining of your uterus grows outside your uterus.  The lining of your uterus grows in the muscle of your uterus.  Your uterus falls down into your vagina.  You have a growth in your uterus that causes problems.  You have cells that could turn into cancer (precancerous cells).  You have cancer of the uterus or cervix. Types There are 3 types of hysterectomies. Depending on the type, the surgery will:  Remove the top part of the uterus only.  Remove the uterus and the cervix.  Remove the uterus, cervix, and tissue that holds the uterus in place in the lower belly. Ways a hysterectomy can be performed There are 5 ways this surgery can be performed.  A cut (incision) is made in the belly (abdomen). The uterus is taken out through the cut.  A cut is made in the vagina. The uterus is taken out through the cut.  Three or four cuts are made in the  belly. A surgical device with a camera is put through one of the cuts. The uterus is cut into small pieces. The uterus is taken out through the cuts or the vagina.  Three or four cuts are made in the belly. A surgical device with a camera  is put through one of the cuts. The uterus is taken out through the vagina.  Three or four cuts are made in the belly. A surgical device that is controlled by a computer makes a visual image. The device helps the surgeon control the surgical tools. The uterus is cut into small pieces. The pieces are taken out through the cuts or through the vagina. What can I expect after the surgery?  You will be given pain medicine.  You will need help at home for 3-5 days after surgery.  You will need to see your doctor in 2-4 weeks after surgery.  You may get hot flashes, have night sweats, and have trouble sleeping.  You may need to have Pap tests in the future if your surgery was related to cancer. Talk to your doctor. It is still good to have regular exams. This information is not intended to replace advice given to you by your health care provider. Make sure you discuss any questions you have with your health care provider. Document Released: 08/31/2011 Document Revised: 11/14/2015 Document Reviewed: 02/13/2013 Elsevier Interactive Patient Education  2017 [place] Anesthesia, Adult General anesthesia is the use of medicines to make a person "go to sleep" (be unconscious) for a medical procedure. General anesthesia is often recommended when a procedure:  Is long.  Requires you to be still or in an unusual position.  Is major and can cause you to lose blood.  Is impossible to do without general anesthesia. The medicines used for general anesthesia are called general anesthetics. In addition to making you sleep, the medicines:  Prevent pain.  Control your blood pressure.  Relax your muscles. Tell a health care provider about:  Any allergies  you have.  All medicines you are taking, including vitamins, herbs, eye drops, creams, and over-the-counter medicines.  Any problems you or family members have had with anesthetic medicines.  Types of anesthetics you have had in the past.  Any bleeding disorders you have.  Any surgeries you have had.  Any medical conditions you have.  Any history of heart or lung conditions, such as heart failure, sleep apnea, or chronic obstructive pulmonary disease (COPD).  Whether you are pregnant or may be pregnant.  Whether you use tobacco, alcohol, marijuana, or street drugs.  Any history of Armed forces logistics/support/administrative officer.  Any history of depression or anxiety. What are the risks? Generally, this is a safe procedure. However, problems may occur, including:  Allergic reaction to anesthetics.  Lung and heart problems.  Inhaling food or liquids from your stomach into your lungs (aspiration).  Injury to nerves.  Waking up during your procedure and being unable to move (rare).  Extreme agitation or a state of mental confusion (delirium) when you wake up from the anesthetic.  Air in the bloodstream, which can lead to stroke. These problems are more likely to develop if you are having a major surgery or if you have an advanced medical condition. You can prevent some of these complications by answering all of your health care provider's questions thoroughly and by following all pre-procedure instructions. General anesthesia can cause side effects, including:  Nausea or vomiting  A sore throat from the breathing tube.  Feeling cold or shivery.  Feeling tired, washed out, or achy.  Sleepiness or drowsiness.  Confusion or agitation. What happens before the procedure? Staying hydrated  Follow instructions from your health care provider about hydration, which may include:  Up to 2 hours before the procedure -  you may continue to drink clear liquids, such as water, clear fruit juice, black coffee,  and plain tea. Eating and drinking restrictions  Follow instructions from your health care provider about eating and drinking, which may include:  8 hours before the procedure - stop eating heavy meals or foods such as meat, fried foods, or fatty foods.  6 hours before the procedure - stop eating light meals or foods, such as toast or cereal.  6 hours before the procedure - stop drinking milk or drinks that contain milk.  2 hours before the procedure - stop drinking clear liquids. Medicines   Ask your health care provider about:  Changing or stopping your regular medicines. This is especially important if you are taking diabetes medicines or blood thinners.  Taking medicines such as aspirin and ibuprofen. These medicines can thin your blood. Do not take these medicines before your procedure if your health care provider instructs you not to.  Taking new dietary supplements or medicines. Do not take these during the week before your procedure unless your health care provider approves them.  If you are told to take a medicine or to continue taking a medicine on the day of the procedure, take the medicine with sips of water. General instructions    Ask if you will be going home the same day, the following day, or after a longer hospital stay.  Plan to have someone take you home.  Plan to have someone stay with you for the first 24 hours after you leave the hospital or clinic.  For 3-6 weeks before the procedure, try not to use any tobacco products, such as cigarettes, chewing tobacco, and e-cigarettes.  You may brush your teeth on the morning of the procedure, but make sure to spit out the toothpaste. What happens during the procedure?  You will be given anesthetics through a mask and through an IV tube in one of your veins.  You may receive medicine to help you relax (sedative).  As soon as you are asleep, a breathing tube may be used to help you breathe.  An anesthesia  specialist will stay with you throughout the procedure. He or she will help keep you comfortable and safe by continuing to give you medicines and adjusting the amount of medicine that you get. He or she will also watch your blood pressure, pulse, and oxygen levels to make sure that the anesthetics do not cause any problems.  If a breathing tube was used to help you breathe, it will be removed before you wake up. The procedure may vary among health care providers and hospitals. What happens after the procedure?  You will wake up, often slowly, after the procedure is complete, usually in a recovery area.  Your blood pressure, heart rate, breathing rate, and blood oxygen level will be monitored until the medicines you were given have worn off.  You may be given medicine to help you calm down if you feel anxious or agitated.  If you will be going home the same day, your health care provider may check to make sure you can stand, drink, and urinate.  Your health care providers will treat your pain and side effects before you go home.  Do not drive for 24 hours if you received a sedative.  You may:  Feel nauseous and vomit.  Have a sore throat.  Have mental slowness.  Feel cold or shivery.  Feel sleepy.  Feel tired.  Feel sore or achy, even in  parts of your body where you did not have surgery. This information is not intended to replace advice given to you by your health care provider. Make sure you discuss any questions you have with your health care provider. Document Released: 09/15/2007 Document Revised: 11/19/2015 Document Reviewed: 05/23/2015 Elsevier Interactive Patient Education  2017 [place] Anesthesia, Adult, Care After These instructions provide you with information about caring for yourself after your procedure. Your health care provider may also give you more specific instructions. Your treatment has been planned according to current medical practices, but  problems sometimes occur. Call your health care provider if you have any problems or questions after your procedure. What can I expect after the procedure? After the procedure, it is common to have:  Vomiting.  A sore throat.  Mental slowness. It is common to feel:  Nauseous.  Cold or shivery.  Sleepy.  Tired.  Sore or achy, even in parts of your body where you did not have surgery. Follow these instructions at home: For at least 24 hours after the procedure:   Do not:  Participate in activities where you could fall or become injured.  Drive.  Use heavy machinery.  Drink alcohol.  Take sleeping pills or medicines that cause drowsiness.  Make important decisions or sign legal documents.  Take care of children on your own.  Rest. Eating and drinking   If you vomit, drink water, juice, or soup when you can drink without vomiting.  Drink enough fluid to keep your urine clear or pale yellow.  Make sure you have little or no nausea before eating solid foods.  Follow the diet recommended by your health care provider. General instructions   Have a responsible adult stay with you until you are awake and alert.  Return to your normal activities as told by your health care provider. Ask your health care provider what activities are safe for you.  Take over-the-counter and prescription medicines only as told by your health care provider.  If you smoke, do not smoke without supervision.  Keep all follow-up visits as told by your health care provider. This is important. Contact a health care provider if:  You continue to have nausea or vomiting at home, and medicines are not helpful.  You cannot drink fluids or start eating again.  You cannot urinate after 8-12 hours.  You develop a skin rash.  You have fever.  You have increasing redness at the site of your procedure. Get help right away if:  You have difficulty breathing.  You have chest pain.  You  have unexpected bleeding.  You feel that you are having a life-threatening or urgent problem. This information is not intended to replace advice given to you by your health care provider. Make sure you discuss any questions you have with your health care provider. Document Released: 09/14/2000 Document Revised: 11/11/2015 Document Reviewed: 05/23/2015 Elsevier Interactive Patient Education  2017 [employer].

## 2016-09-02 ENCOUNTER — Other Ambulatory Visit: Payer: Self-pay | Admitting: Obstetrics & Gynecology

## 2016-09-04 ENCOUNTER — Encounter (HOSPITAL_COMMUNITY): Payer: Self-pay

## 2016-09-04 ENCOUNTER — Encounter (HOSPITAL_COMMUNITY)
Admission: RE | Admit: 2016-09-04 | Discharge: 2016-09-04 | Disposition: A | Discharge status: Home / Self Care | Payer: BLUE CROSS/BLUE SHIELD | Source: Ambulatory Visit | Attending: Obstetrics & Gynecology | Admitting: Obstetrics & Gynecology

## 2016-09-04 DIAGNOSIS — Z01812 Encounter for preprocedural laboratory examination: Secondary | ICD-10-CM | POA: Diagnosis present

## 2016-09-04 LAB — COMPREHENSIVE METABOLIC PANEL
ALT: 17 U/L (ref 14–54)
AST: 17 U/L (ref 15–41)
Albumin: 4.1 g/dL (ref 3.5–5.0)
Alkaline Phosphatase: 56 U/L (ref 38–126)
Anion gap: 9 (ref 5–15)
BUN: 18 mg/dL (ref 6–20)
CHLORIDE: 108 mmol/L (ref 101–111)
CO2: 23 mmol/L (ref 22–32)
CREATININE: 0.99 mg/dL (ref 0.44–1.00)
Calcium: 9.4 mg/dL (ref 8.9–10.3)
GFR calc non Af Amer: >60 mL/min (ref >60)
Glucose, Bld: 80 mg/dL (ref 65–99)
Potassium: 4.2 mmol/L (ref 3.5–5.1)
Sodium: 140 mmol/L (ref 135–145)
Total Bilirubin: 0.6 mg/dL (ref 0.3–1.2)
Total Protein: 6.9 g/dL (ref 6.5–8.1)

## 2016-09-04 LAB — CBC
HEMATOCRIT: 38 % (ref 36.0–46.0)
HEMOGLOBIN: 12.8 g/dL (ref 12.0–15.0)
MCH: 31 pg (ref 26.0–34.0)
MCHC: 33.7 g/dL (ref 30.0–36.0)
MCV: 92 fL (ref 78.0–100.0)
Platelets: 386 10*3/uL (ref 150–400)
RBC: 4.13 MIL/uL (ref 3.87–5.11)
RDW: 13.6 % (ref 11.5–15.5)
WBC: 8.9 10*3/uL (ref 4.0–10.5)

## 2016-09-04 LAB — URINALYSIS, ROUTINE W REFLEX MICROSCOPIC
BACTERIA UA: NONE SEEN
Glucose, UA: NEGATIVE mg/dL
Hgb urine dipstick: NEGATIVE
Ketones, ur: NEGATIVE mg/dL
Leukocytes, UA: NEGATIVE
NITRITE: NEGATIVE
PH: 5 (ref 5.0–8.0)
Protein, ur: 30 mg/dL — AB
SPECIFIC GRAVITY, URINE: 1.033 — AB (ref 1.005–1.030)

## 2016-09-04 LAB — RAPID HIV SCREEN (HIV 1/2 AB+AG)
HIV 1/2 ANTIBODIES: NONREACTIVE
HIV-1 P24 ANTIGEN - HIV24: NONREACTIVE

## 2016-09-04 LAB — HCG, QUANTITATIVE, PREGNANCY: hCG, Beta Chain, Quant, S: 1 m[IU]/mL (ref <5)

## 2016-09-08 LAB — TYPE AND SCREEN
ABO/RH(D): O POS
ANTIBODY SCREEN: NEGATIVE

## 2016-09-09 ENCOUNTER — Ambulatory Visit (HOSPITAL_COMMUNITY): Payer: BLUE CROSS/BLUE SHIELD | Admitting: Anesthesiology

## 2016-09-09 ENCOUNTER — Encounter (HOSPITAL_COMMUNITY)
Admission: RE | Disposition: A | Discharge status: Home / Self Care | Payer: Self-pay | Source: Ambulatory Visit | Attending: Obstetrics & Gynecology

## 2016-09-09 ENCOUNTER — Encounter (HOSPITAL_COMMUNITY): Payer: Self-pay | Admitting: Anesthesiology

## 2016-09-09 ENCOUNTER — Observation Stay (HOSPITAL_COMMUNITY)
Admission: RE | Admit: 2016-09-09 | Discharge: 2016-09-10 | Disposition: A | Discharge status: Home / Self Care | Payer: BLUE CROSS/BLUE SHIELD | Source: Ambulatory Visit | Attending: Obstetrics & Gynecology | Admitting: Obstetrics & Gynecology

## 2016-09-09 DIAGNOSIS — D259 Leiomyoma of uterus, unspecified: Secondary | ICD-10-CM | POA: Insufficient documentation

## 2016-09-09 DIAGNOSIS — N946 Dysmenorrhea, unspecified: Secondary | ICD-10-CM | POA: Diagnosis not present

## 2016-09-09 DIAGNOSIS — N800 Endometriosis of uterus: Secondary | ICD-10-CM | POA: Insufficient documentation

## 2016-09-09 DIAGNOSIS — F329 Major depressive disorder, single episode, unspecified: Secondary | ICD-10-CM | POA: Insufficient documentation

## 2016-09-09 DIAGNOSIS — G43909 Migraine, unspecified, not intractable, without status migrainosus: Secondary | ICD-10-CM | POA: Diagnosis not present

## 2016-09-09 DIAGNOSIS — E039 Hypothyroidism, unspecified: Secondary | ICD-10-CM | POA: Diagnosis not present

## 2016-09-09 DIAGNOSIS — N83201 Unspecified ovarian cyst, right side: Secondary | ICD-10-CM | POA: Insufficient documentation

## 2016-09-09 DIAGNOSIS — N9419 Other specified dyspareunia: Secondary | ICD-10-CM | POA: Diagnosis not present

## 2016-09-09 DIAGNOSIS — Z90710 Acquired absence of both cervix and uterus: Secondary | ICD-10-CM | POA: Diagnosis present

## 2016-09-09 DIAGNOSIS — R102 Pelvic and perineal pain: Secondary | ICD-10-CM | POA: Insufficient documentation

## 2016-09-09 DIAGNOSIS — N941 Unspecified dyspareunia: Principal | ICD-10-CM | POA: Insufficient documentation

## 2016-09-09 DIAGNOSIS — F17210 Nicotine dependence, cigarettes, uncomplicated: Secondary | ICD-10-CM | POA: Diagnosis not present

## 2016-09-09 DIAGNOSIS — Z79899 Other long term (current) drug therapy: Secondary | ICD-10-CM | POA: Insufficient documentation

## 2016-09-09 DIAGNOSIS — R1031 Right lower quadrant pain: Secondary | ICD-10-CM

## 2016-09-09 HISTORY — PX: VAGINAL HYSTERECTOMY: SHX2639

## 2016-09-09 HISTORY — PX: OOPHORECTOMY: SHX6387

## 2016-09-09 SURGERY — HYSTERECTOMY, VAGINAL
Anesthesia: General | Laterality: Right

## 2016-09-09 MED ORDER — SODIUM CHLORIDE 0.9 % IR SOLN
Status: DC | PRN
  Administered 2016-09-09: 3000 mL
  Administered 2016-09-09: 1000 mL

## 2016-09-09 MED ORDER — BUPIVACAINE-EPINEPHRINE (PF) 0.5% -1:200000 IJ SOLN
INTRAMUSCULAR | Status: AC
Start: 2016-09-09 — End: ?
  Filled 2016-09-09: qty 30

## 2016-09-09 MED ORDER — ZOLPIDEM TARTRATE 5 MG PO TABS: 5.0000 mg | ORAL_TABLET | Freq: Every evening | ORAL | Status: DC | PRN

## 2016-09-09 MED ORDER — SODIUM CHLORIDE 0.9 % IJ SOLN
INTRAMUSCULAR | Status: AC
  Filled 2016-09-09: qty 10

## 2016-09-09 MED ORDER — TIZANIDINE HCL 4 MG PO TABS: 4.0000 mg | ORAL_TABLET | Freq: Two times a day (BID) | ORAL | Status: DC | PRN

## 2016-09-09 MED ORDER — ARTIFICIAL TEARS OP OINT
TOPICAL_OINTMENT | OPHTHALMIC | Status: DC | PRN
  Administered 2016-09-09: 1 via OPHTHALMIC

## 2016-09-09 MED ORDER — SUCCINYLCHOLINE CHLORIDE 20 MG/ML IJ SOLN
INTRAMUSCULAR | Status: AC
  Filled 2016-09-09: qty 1

## 2016-09-09 MED ORDER — ONDANSETRON HCL 4 MG PO TABS: 8.0000 mg | ORAL_TABLET | Freq: Four times a day (QID) | ORAL | Status: DC | PRN

## 2016-09-09 MED ORDER — ROCURONIUM BROMIDE 50 MG/5ML IV SOLN
INTRAVENOUS | Status: AC
Start: 2016-09-09 — End: ?
  Filled 2016-09-09: qty 1

## 2016-09-09 MED ORDER — DEXAMETHASONE SODIUM PHOSPHATE 4 MG/ML IJ SOLN
INTRAMUSCULAR | Status: AC
Start: 2016-09-09 — End: ?
  Filled 2016-09-09: qty 1

## 2016-09-09 MED ORDER — ONDANSETRON HCL 4 MG/2ML IJ SOLN
4.0000 mg | Freq: Once | INTRAMUSCULAR | Status: AC
  Administered 2016-09-09: 4 mg via INTRAVENOUS

## 2016-09-09 MED ORDER — ALPRAZOLAM 1 MG PO TABS: 1.0000 mg | ORAL_TABLET | Freq: Three times a day (TID) | ORAL | Status: DC | PRN

## 2016-09-09 MED ORDER — GLYCOPYRROLATE 0.2 MG/ML IJ SOLN
INTRAMUSCULAR | Status: AC
Start: 2016-09-09 — End: ?
  Filled 2016-09-09: qty 1

## 2016-09-09 MED ORDER — TOPIRAMATE 25 MG PO TABS
25.0000 mg | ORAL_TABLET | Freq: Every day | ORAL | Status: DC
  Filled 2016-09-09: qty 1

## 2016-09-09 MED ORDER — SUFENTANIL CITRATE 50 MCG/ML IV SOLN
INTRAVENOUS | Status: AC
  Filled 2016-09-09: qty 1

## 2016-09-09 MED ORDER — CEFAZOLIN SODIUM-DEXTROSE 2-4 GM/100ML-% IV SOLN
2.0000 g | INTRAVENOUS | Status: AC
  Administered 2016-09-09: 2 g via INTRAVENOUS
  Filled 2016-09-09: qty 100

## 2016-09-09 MED ORDER — ALUM & MAG HYDROXIDE-SIMETH 200-200-20 MG/5ML PO SUSP
30.0000 mL | ORAL | Status: DC | PRN
Start: 2016-09-09 — End: 2016-09-10

## 2016-09-09 MED ORDER — PROPOFOL 10 MG/ML IV BOLUS
INTRAVENOUS | Status: AC
  Filled 2016-09-09: qty 40

## 2016-09-09 MED ORDER — LIDOCAINE HCL (PF) 1 % IJ SOLN
INTRAMUSCULAR | Status: AC
  Filled 2016-09-09: qty 5

## 2016-09-09 MED ORDER — GLYCOPYRROLATE 0.2 MG/ML IJ SOLN
INTRAMUSCULAR | Status: DC | PRN
  Administered 2016-09-09: 0.6 mg via INTRAVENOUS

## 2016-09-09 MED ORDER — HYDROMORPHONE HCL 1 MG/ML IJ SOLN
1.0000 mg | INTRAMUSCULAR | Status: DC | PRN
  Administered 2016-09-09: 2 mg via INTRAVENOUS
  Administered 2016-09-09: 1 mg via INTRAVENOUS
  Administered 2016-09-09: 2 mg via INTRAVENOUS
  Administered 2016-09-09 (×2): 1 mg via INTRAVENOUS
  Administered 2016-09-09: 2 mg via INTRAVENOUS
  Administered 2016-09-10 (×3): 1 mg via INTRAVENOUS
  Filled 2016-09-09: qty 2
  Filled 2016-09-09: qty 1
  Filled 2016-09-09 (×7): qty 2

## 2016-09-09 MED ORDER — MIDAZOLAM HCL 2 MG/2ML IJ SOLN
1.0000 mg | INTRAMUSCULAR | Status: DC
  Administered 2016-09-09: 2 mg via INTRAVENOUS

## 2016-09-09 MED ORDER — LEVOTHYROXINE SODIUM 25 MCG PO TABS
137.0000 ug | ORAL_TABLET | Freq: Every day | ORAL | Status: DC
  Administered 2016-09-10: 137 ug via ORAL
  Filled 2016-09-09: qty 1

## 2016-09-09 MED ORDER — ONDANSETRON HCL 4 MG/2ML IJ SOLN
INTRAMUSCULAR | Status: AC
  Filled 2016-09-09: qty 2

## 2016-09-09 MED ORDER — BISACODYL 10 MG RE SUPP: 10.0000 mg | Freq: Every day | RECTAL | Status: DC | PRN

## 2016-09-09 MED ORDER — LIDOCAINE HCL (PF) 1 % IJ SOLN
INTRAMUSCULAR | Status: AC
Start: 2016-09-09 — End: ?
  Filled 2016-09-09: qty 5

## 2016-09-09 MED ORDER — STERILE WATER FOR IRRIGATION IR SOLN
Status: DC | PRN
  Administered 2016-09-09: 1000 mL

## 2016-09-09 MED ORDER — LACTATED RINGERS IV SOLN
INTRAVENOUS | Status: DC | PRN
  Administered 2016-09-09 (×3): via INTRAVENOUS

## 2016-09-09 MED ORDER — DOCUSATE SODIUM 100 MG PO CAPS
100.0000 mg | ORAL_CAPSULE | Freq: Two times a day (BID) | ORAL | Status: DC
  Administered 2016-09-09 – 2016-09-10 (×2): 100 mg via ORAL
  Filled 2016-09-09 (×2): qty 1

## 2016-09-09 MED ORDER — LEVOFLOXACIN IN D5W 750 MG/150ML IV SOLN
750.0000 mg | Freq: Once | INTRAVENOUS | Status: AC
  Administered 2016-09-09: 750 mg via INTRAVENOUS
  Filled 2016-09-09: qty 150

## 2016-09-09 MED ORDER — ROCURONIUM BROMIDE 50 MG/5ML IV SOLN
INTRAVENOUS | Status: AC
  Filled 2016-09-09: qty 1

## 2016-09-09 MED ORDER — ONDANSETRON HCL 40 MG/20ML IJ SOLN
8.0000 mg | Freq: Four times a day (QID) | INTRAMUSCULAR | Status: DC | PRN
  Filled 2016-09-09: qty 4

## 2016-09-09 MED ORDER — SENNOSIDES-DOCUSATE SODIUM 8.6-50 MG PO TABS: 1.0000 | ORAL_TABLET | Freq: Every evening | ORAL | Status: DC | PRN

## 2016-09-09 MED ORDER — MIDAZOLAM HCL 2 MG/2ML IJ SOLN
INTRAMUSCULAR | Status: AC
  Filled 2016-09-09: qty 2

## 2016-09-09 MED ORDER — LIDOCAINE HCL (CARDIAC) 20 MG/ML IV SOLN
INTRAVENOUS | Status: DC | PRN
  Administered 2016-09-09: 40 mg via INTRAVENOUS

## 2016-09-09 MED ORDER — CLONIDINE HCL 0.1 MG PO TABS
0.1000 mg | ORAL_TABLET | Freq: Two times a day (BID) | ORAL | Status: DC
  Administered 2016-09-09 – 2016-09-10 (×2): 0.1 mg via ORAL
  Filled 2016-09-09 (×2): qty 1

## 2016-09-09 MED ORDER — OXYCODONE-ACETAMINOPHEN 5-325 MG PO TABS
1.0000 | ORAL_TABLET | ORAL | Status: DC | PRN
  Administered 2016-09-09 (×2): 2 via ORAL
  Administered 2016-09-09: 1 via ORAL
  Administered 2016-09-10 (×3): 2 via ORAL
  Filled 2016-09-09 (×2): qty 2
  Filled 2016-09-09: qty 1
  Filled 2016-09-09 (×3): qty 2

## 2016-09-09 MED ORDER — AMITRIPTYLINE HCL 25 MG PO TABS
150.0000 mg | ORAL_TABLET | Freq: Every day | ORAL | Status: DC
  Administered 2016-09-09: 150 mg via ORAL
  Filled 2016-09-09: qty 6

## 2016-09-09 MED ORDER — ARTIFICIAL TEARS OP OINT
TOPICAL_OINTMENT | OPHTHALMIC | Status: AC
  Filled 2016-09-09: qty 3.5

## 2016-09-09 MED ORDER — CLONAZEPAM 0.5 MG PO TABS
0.5000 mg | ORAL_TABLET | Freq: Two times a day (BID) | ORAL | Status: DC | PRN
  Administered 2016-09-09 – 2016-09-10 (×2): 0.5 mg via ORAL
  Filled 2016-09-09 (×2): qty 1

## 2016-09-09 MED ORDER — NEOSTIGMINE METHYLSULFATE 10 MG/10ML IV SOLN
INTRAVENOUS | Status: DC | PRN
  Administered 2016-09-09: 4 mg via INTRAVENOUS

## 2016-09-09 MED ORDER — KCL IN DEXTROSE-NACL 20-5-0.45 MEQ/L-%-% IV SOLN
INTRAVENOUS | Status: DC
Start: 2016-09-09 — End: 2016-09-10
  Administered 2016-09-09 – 2016-09-10 (×3): via INTRAVENOUS

## 2016-09-09 MED ORDER — TOPIRAMATE 25 MG PO TABS
75.0000 mg | ORAL_TABLET | Freq: Every day | ORAL | Status: DC
  Administered 2016-09-10: 75 mg via ORAL
  Filled 2016-09-09: qty 3

## 2016-09-09 MED ORDER — KETOROLAC TROMETHAMINE 30 MG/ML IJ SOLN
30.0000 mg | Freq: Once | INTRAMUSCULAR | Status: AC
  Administered 2016-09-09: 30 mg via INTRAVENOUS
  Filled 2016-09-09: qty 1

## 2016-09-09 MED ORDER — LACTATED RINGERS IV SOLN
INTRAVENOUS | Status: DC
  Administered 2016-09-09: 1000 mL via INTRAVENOUS

## 2016-09-09 MED ORDER — SUFENTANIL CITRATE 50 MCG/ML IV SOLN
INTRAVENOUS | Status: DC | PRN
  Administered 2016-09-09 (×6): 10 ug via INTRAVENOUS

## 2016-09-09 MED ORDER — HYDROMORPHONE HCL 1 MG/ML IJ SOLN
0.2500 mg | INTRAMUSCULAR | Status: DC | PRN
  Administered 2016-09-09 (×3): 0.5 mg via INTRAVENOUS
  Filled 2016-09-09 (×3): qty 0.5

## 2016-09-09 MED ORDER — DEXAMETHASONE SODIUM PHOSPHATE 4 MG/ML IJ SOLN
INTRAMUSCULAR | Status: DC | PRN
  Administered 2016-09-09: 4 mg via INTRAVENOUS

## 2016-09-09 MED ORDER — ROCURONIUM BROMIDE 100 MG/10ML IV SOLN
INTRAVENOUS | Status: DC | PRN
  Administered 2016-09-09: 30 mg via INTRAVENOUS

## 2016-09-09 MED ORDER — SIMETHICONE 80 MG PO CHEW: 80.0000 mg | CHEWABLE_TABLET | Freq: Four times a day (QID) | ORAL | Status: DC | PRN

## 2016-09-09 MED ORDER — EPHEDRINE SULFATE 50 MG/ML IJ SOLN
INTRAMUSCULAR | Status: AC
  Filled 2016-09-09: qty 1

## 2016-09-09 MED ORDER — LABETALOL HCL 5 MG/ML IV SOLN
INTRAVENOUS | Status: AC
  Filled 2016-09-09: qty 4

## 2016-09-09 MED ORDER — BUPIVACAINE-EPINEPHRINE (PF) 0.5% -1:200000 IJ SOLN
INTRAMUSCULAR | Status: DC | PRN
  Administered 2016-09-09: 10 mL via PERINEURAL

## 2016-09-09 MED ORDER — ONDANSETRON HCL 4 MG/2ML IJ SOLN
INTRAMUSCULAR | Status: DC | PRN
  Administered 2016-09-09: 4 mg via INTRAVENOUS

## 2016-09-09 MED ORDER — MELATONIN 5 MG PO TABS: 5.0000 mg | ORAL_TABLET | Freq: Every evening | ORAL | Status: DC | PRN

## 2016-09-09 MED ORDER — GLYCOPYRROLATE 0.2 MG/ML IJ SOLN
INTRAMUSCULAR | Status: AC
  Filled 2016-09-09: qty 3

## 2016-09-09 MED ORDER — PROPOFOL 10 MG/ML IV BOLUS
INTRAVENOUS | Status: DC | PRN
  Administered 2016-09-09: 150 mg via INTRAVENOUS

## 2016-09-09 SURGICAL SUPPLY — 38 items
BAG HAMPER (MISCELLANEOUS) ×3 IMPLANT
BLADE SURG SZ10 CARB STEEL (BLADE) ×3 IMPLANT
CLOTH BEACON ORANGE TIMEOUT ST (SAFETY) ×3 IMPLANT
COVER LIGHT HANDLE STERIS (MISCELLANEOUS) ×6 IMPLANT
DECANTER SPIKE VIAL GLASS SM (MISCELLANEOUS) ×3 IMPLANT
DRAPE HALF SHEET 40X57 (DRAPES) ×3 IMPLANT
DRAPE PROXIMA HALF (DRAPES) ×3 IMPLANT
DRAPE STERI URO 9X17 APER PCH (DRAPES) ×3 IMPLANT
ELECT REM PT RETURN 9FT ADLT (ELECTROSURGICAL) ×3
ELECTRODE REM PT RTRN 9FT ADLT (ELECTROSURGICAL) ×2 IMPLANT
FORMALIN 10 PREFIL 480ML (MISCELLANEOUS) ×3 IMPLANT
GLOVE BIOGEL PI IND STRL 7.0 (GLOVE) ×2 IMPLANT
GLOVE BIOGEL PI IND STRL 8 (GLOVE) ×2 IMPLANT
GLOVE BIOGEL PI INDICATOR 7.0 (GLOVE) ×1
GLOVE BIOGEL PI INDICATOR 8 (GLOVE) ×1
GLOVE ECLIPSE 6.5 STRL STRAW (GLOVE) ×3 IMPLANT
GLOVE ECLIPSE 8.0 STRL XLNG CF (GLOVE) ×3 IMPLANT
GLOVE INDICATOR 6.5 STRL GRN (GLOVE) ×3 IMPLANT
GLOVE INDICATOR 8.0 STRL GRN (GLOVE) ×3 IMPLANT
GLOVE SURG SS PI 6.5 STRL IVOR (GLOVE) ×3 IMPLANT
GOWN STRL REUS W/TWL LRG LVL3 (GOWN DISPOSABLE) ×6 IMPLANT
GOWN STRL REUS W/TWL XL LVL3 (GOWN DISPOSABLE) ×3 IMPLANT
IV NS IRRIG 3000ML ARTHROMATIC (IV SOLUTION) ×3 IMPLANT
KIT ROOM TURNOVER AP CYSTO (KITS) ×3 IMPLANT
KIT ROOM TURNOVER APOR (KITS) IMPLANT
MANIFOLD NEPTUNE II (INSTRUMENTS) ×3 IMPLANT
NEEDLE HYPO 25X1 1.5 SAFETY (NEEDLE) ×3 IMPLANT
NS IRRIG 1000ML POUR BTL (IV SOLUTION) ×6 IMPLANT
PACK PERI GYN (CUSTOM PROCEDURE TRAY) ×3 IMPLANT
PAD ARMBOARD 7.5X6 YLW CONV (MISCELLANEOUS) ×3 IMPLANT
SET BASIN LINEN APH (SET/KITS/TRAYS/PACK) ×3 IMPLANT
SUT MNCRL+ AB 3-0 CT1 36 (SUTURE) ×2 IMPLANT
SUT MONOCRYL AB 3-0 CT1 36IN (SUTURE) ×1
SUT VIC AB 0 CT1 27 (SUTURE) ×3
SUT VIC AB 0 CT1 27XCR 8 STRN (SUTURE) ×6 IMPLANT
SYR CONTROL 10ML LL (SYRINGE) ×3 IMPLANT
TRAY FOLEY W/METER SILVER 16FR (SET/KITS/TRAYS/PACK) ×3 IMPLANT
VERSALIGHT (MISCELLANEOUS) ×3 IMPLANT

## 2016-09-09 NOTE — Transfer of Care (Signed)
Immediate Anesthesia Transfer of Care Note  Patient: Vanessa Bowen  Procedure(s) Performed: Procedure(s): HYSTERECTOMY VAGINAL (N/A) RIGHT OOPHORECTOMY (Right)  Patient Location: PACU  Anesthesia Type:General  Level of Consciousness: awake, alert , oriented and patient cooperative  Airway & Oxygen Therapy: Patient Spontanous Breathing and Patient connected to nasal cannula oxygen  Post-op Assessment: Report given to RN and Post -op Vital signs reviewed and stable  Post vital signs: Reviewed and stable  Last Vitals:  Vitals:   09/09/16 0710 09/09/16 0715  BP: 106/64 102/71  Resp: 15 (!) 23  Temp:      Last Pain: There were no vitals filed for this visit.       Complications: No apparent anesthesia complications

## 2016-09-09 NOTE — Anesthesia Postprocedure Evaluation (Signed)
Anesthesia Post Note Late Entry for 1010 Patient: Vanessa Bowen  Procedure(s) Performed: Procedure(s) (LRB): HYSTERECTOMY VAGINAL (N/A) RIGHT OOPHORECTOMY (Right)  Patient location during evaluation: PACU Anesthesia Type: General Level of consciousness: awake and alert and oriented Pain management: pain level controlled Vital Signs Assessment: post-procedure vital signs reviewed and stable Respiratory status: spontaneous breathing Cardiovascular status: stable Postop Assessment: no signs of nausea or vomiting Anesthetic complications: no     Last Vitals:  Vitals:   09/09/16 1030 09/09/16 1045  BP: 122/76 120/82  Pulse: 86 90  Resp: 18 11  Temp:      Last Pain:  Vitals:   09/09/16 1030  PainSc: 5                  Maisha Bogen A

## 2016-09-09 NOTE — Anesthesia Preprocedure Evaluation (Signed)
Anesthesia Evaluation  Patient identified by MRN, date of birth, ID band Patient awake    Reviewed: Allergy & Precautions, H&P , NPO status , Patient's Chart, lab work & pertinent test results  History of Anesthesia Complications Negative for: history of anesthetic complications  Airway Mallampati: II  TM Distance: >3 FB     Dental  (+) Teeth Intact   Pulmonary Current Smoker,    breath sounds clear to auscultation       Cardiovascular negative cardio ROS   Rhythm:Regular Rate:Normal     Neuro/Psych  Headaches, PSYCHIATRIC DISORDERS Anxiety Depression    GI/Hepatic PUD, Ulcerative colitis, in remission x 7 yrs, no steroids now.    Endo/Other  Hypothyroidism   Renal/GU      Musculoskeletal  (+) Arthritis ,   Abdominal   Peds  Hematology   Anesthesia Other Findings   Reproductive/Obstetrics                             Anesthesia Physical Anesthesia Plan  ASA: II  Anesthesia Plan: General   Post-op Pain Management:    Induction: Intravenous  Airway Management Planned: Oral ETT  Additional Equipment:   Intra-op Plan:   Post-operative Plan: Extubation in OR  Informed Consent: I have reviewed the patients History and Physical, chart, labs and discussed the procedure including the risks, benefits and alternatives for the proposed anesthesia with the patient or authorized representative who has indicated his/her understanding and acceptance.     Plan Discussed with:   Anesthesia Plan Comments:         Anesthesia Quick Evaluation

## 2016-09-09 NOTE — Anesthesia Procedure Notes (Signed)
Procedure Name: Intubation Date/Time: 09/09/2016 7:31 AM Performed by: Andree Elk, AMY A Pre-anesthesia Checklist: Patient identified, Patient being monitored, Timeout performed, Emergency Drugs available and Suction available Patient Re-evaluated:Patient Re-evaluated prior to inductionOxygen Delivery Method: Circle System Utilized Preoxygenation: Pre-oxygenation with 100% oxygen Intubation Type: IV induction Ventilation: Mask ventilation without difficulty Laryngoscope Size: Miller and 3 Grade View: Grade I Tube type: Oral Tube size: 7.0 mm Number of attempts: 1 Airway Equipment and Method: Stylet Placement Confirmation: ETT inserted through vocal cords under direct vision,  positive ETCO2 and breath sounds checked- equal and bilateral Secured at: 21 cm Tube secured with: Tape Dental Injury: Teeth and Oropharynx as per pre-operative assessment

## 2016-09-09 NOTE — H&P (Signed)
Preoperative History and Physical  Vanessa Bowen is a 41 y.o. No obstetric history on file. with Patient's last menstrual period was 07/24/2016. admitted for a [facility] RSO left salpingectomy.  Patient is having increasingly intolerable menstrual periods They're heavy clots with lots of cramping and pain almost can't function for several days a month She has pain with intercourse every time She also has quite a bit of emotional lability during the month All of her pain is midline she has no left or right third pelvic pain She has a history of 1 C-section after vaginal delivery Her urinalysis today was negative  PMH:        Past Medical History:  Diagnosis Date  . Anxiety   . Arthritis   . Chronic back pain   . Colitis, ulcerative ([place])   . Depression   . Hypothyroidism   . Migraine     PSH:          Past Surgical History:  Procedure Laterality Date  . ANKLE SURGERY    . breast augmentation    . CESAREAN SECTION    . KNEE SURGERY    . LAPAROSCOPIC BILATERAL SALPINGECTOMY  07/27/2012   Procedure: LAPAROSCOPIC BILATERAL SALPINGECTOMY;  Surgeon: Florian Buff, MD;  Location: [facility];  Service: Gynecology;  Laterality: N/A;  . OVARIAN CYST REMOVAL      POb/GynH:         OB History    No data available      SH:          Social History  Substance Use Topics  . Smoking status: Current Every Day Smoker    Packs/day: 0.50    Years: 15.00    Types: Cigarettes  . Smokeless tobacco: Never Used  . Alcohol use Yes      Comment: occasionally    FH:         Family History  Problem Relation Age of Onset  . Cancer Maternal Grandmother   . Cancer Maternal Grandfather      Allergies:      Allergies  Allergen Reactions  . Doxycycline Nausea And Vomiting  . Sulfonamide Derivatives Nausea And Vomiting    Medications:       Current Outpatient Prescriptions:  .  amitriptyline (ELAVIL) 50 MG tablet, Take 100 mg by mouth at  bedtime., Disp: , Rfl: 1 .  clonazePAM (KLONOPIN) 0.5 MG tablet, Take 1 tablet twice a day as needed for nerves., Disp: , Rfl: 1 .  cloNIDine (CATAPRES) 0.1 MG tablet, Take 0.1 mg by mouth 2 (two) times daily., Disp: , Rfl:  .  HYDROcodone-acetaminophen (NORCO/VICODIN) 5-325 MG tablet, Take 1 tablet by mouth every 6 (six) hours as needed for moderate pain (Must last 30 days.Do not take and drive a car or use machinery.)., Disp: 100 tablet, Rfl: 0 .  levothyroxine (SYNTHROID, LEVOTHROID) 137 MCG tablet, Take 1 tablet (137 mcg total) by mouth daily before breakfast., Disp: 30 tablet, Rfl: 5 .  nitrofurantoin, macrocrystal-monohydrate, (MACROBID) 100 MG capsule, Take 1 capsule (100 mg total) by mouth 2 (two) times daily., Disp: 14 capsule, Rfl: 0 .  Phenazopyridine HCl (URISTAT PO), Take by mouth., Disp: , Rfl:  .  topiramate (TOPAMAX) 25 MG tablet, Take 25 mg by mouth 2 (two) times daily., Disp: , Rfl:   Review of Systems:   Review of Systems  Constitutional: Negative for fever, chills, weight loss, malaise/fatigue and diaphoresis.  HENT: Negative for hearing loss, ear pain, nosebleeds, congestion, sore throat, neck  pain, tinnitus and ear discharge.   Eyes: Negative for blurred vision, double vision, photophobia, pain, discharge and redness.  Respiratory: Negative for cough, hemoptysis, sputum production, shortness of breath, wheezing and stridor.   Cardiovascular: Negative for chest pain, palpitations, orthopnea, claudication, leg swelling and PND.  Gastrointestinal: Positive for abdominal pain. Negative for heartburn, nausea, vomiting, diarrhea, constipation, blood in stool and melena.  Genitourinary: Negative for dysuria, urgency, frequency, hematuria and flank pain.  Musculoskeletal: Negative for myalgias, back pain, joint pain and falls.  Skin: Negative for itching and rash.  Neurological: Negative for dizziness, tingling, tremors, sensory change, speech change, focal weakness, seizures,  loss of consciousness, weakness and headaches.  Endo/Heme/Allergies: Negative for environmental allergies and polydipsia. Does not bruise/bleed easily.  Psychiatric/Behavioral: Negative for depression, suicidal ideas, hallucinations, memory loss and substance abuse. The patient is not nervous/anxious and does not have insomnia.      PHYSICAL EXAM:  Blood pressure 98/60, pulse 78, weight 191 lb (86.6 kg), last menstrual period 07/24/2016.    Vitals reviewed. Constitutional: She is oriented to person, place, and time. She appears well-developed and well-nourished.  HENT:  Head: Normocephalic and atraumatic.  Right Ear: External ear normal.  Left Ear: External ear normal.  Nose: Nose normal.  Mouth/Throat: Oropharynx is clear and moist.  Eyes: Conjunctivae and EOM are normal. Pupils are equal, round, and reactive to light. Right eye exhibits no discharge. Left eye exhibits no discharge. No scleral icterus.  Neck: Normal range of motion. Neck supple. No tracheal deviation present. No thyromegaly present.  Cardiovascular: Normal rate, regular rhythm, normal heart sounds and intact distal pulses.  Exam reveals no gallop and no friction rub.   No murmur heard. Respiratory: Effort normal and breath sounds normal. No respiratory distress. She has no wheezes. She has no rales. She exhibits no tenderness.  GI: Soft. Bowel sounds are normal. She exhibits no distension and no mass. There is tenderness. There is no rebound and no guarding.  Genitourinary:       Vulva is normal without lesions Vagina is pink moist without discharge Cervix normal in appearance and pap is normal Uterus is normal size, contour, position, consistency, mobility, non-tender Adnexa is negative with normal sized ovaries by sonogram  Musculoskeletal: Normal range of motion. She exhibits no edema and no tenderness.  Neurological: She is alert and oriented to person, place, and time. She has normal reflexes. She displays  normal reflexes. No cranial nerve deficit. She exhibits normal muscle tone. Coordination normal.  Skin: Skin is warm and dry. No rash noted. No erythema. No pallor.  Psychiatric: She has a normal mood and affect. Her behavior is normal. Judgment and thought content normal.    Labs: Results for orders placed or performed during the hospital encounter of 09/04/16 (from the past 168 hour(s))  Type and screen   Collection Time: 09/04/16 12:45 PM  Result Value Ref Range   ABO/RH(D) O POS    Antibody Screen NEG    Sample Expiration 09/18/2016    Extend sample reason NO TRANSFUSIONS OR PREGNANCY IN THE PAST 3 MONTHS   CBC   Collection Time: 09/04/16 12:47 PM  Result Value Ref Range   WBC 8.9 4.0 - 10.5 K/uL   RBC 4.13 3.87 - 5.11 MIL/uL   Hemoglobin 12.8 12.0 - 15.0 g/dL   HCT 38.0 36.0 - 46.0 %   MCV 92.0 78.0 - 100.0 fL   MCH 31.0 26.0 - 34.0 pg   MCHC 33.7 30.0 - 36.0 g/dL  RDW 13.6 11.5 - 15.5 %   Platelets 386 150 - 400 K/uL  Comprehensive metabolic panel   Collection Time: 09/04/16 12:47 PM  Result Value Ref Range   Sodium 140 135 - 145 mmol/L   Potassium 4.2 3.5 - 5.1 mmol/L   Chloride 108 101 - 111 mmol/L   CO2 23 22 - 32 mmol/L   Glucose, Bld 80 65 - 99 mg/dL   BUN 18 6 - 20 mg/dL   Creatinine, Ser 0.99 0.44 - 1.00 mg/dL   Calcium 9.4 8.9 - 10.3 mg/dL   Total Protein 6.9 6.5 - 8.1 g/dL   Albumin 4.1 3.5 - 5.0 g/dL   AST 17 15 - 41 U/L   ALT 17 14 - 54 U/L   Alkaline Phosphatase 56 38 - 126 U/L   Total Bilirubin 0.6 0.3 - 1.2 mg/dL   GFR calc non Af Amer >60 >60 mL/min   GFR calc Af Amer >60 >60 mL/min   Anion gap 9 5 - 15  hCG, quantitative, pregnancy   Collection Time: 09/04/16 12:47 PM  Result Value Ref Range   hCG, Beta Chain, Quant, S 1 <5 mIU/mL  Rapid HIV screen (HIV 1/2 Ab+Ag)   Collection Time: 09/04/16 12:47 PM  Result Value Ref Range   HIV-1 P24 Antigen - HIV24 NON REACTIVE NON REACTIVE   HIV 1/2 Antibodies NON REACTIVE NON REACTIVE    Interpretation (HIV Ag Ab)      A non reactive test result means that HIV 1 or HIV 2 antibodies and HIV 1 p24 antigen were not detected in the specimen.  Urinalysis, Routine w reflex microscopic   Collection Time: 09/04/16  1:11 PM  Result Value Ref Range   Color, Urine AMBER (A) YELLOW   APPearance HAZY (A) CLEAR   Specific Gravity, Urine 1.033 (H) 1.005 - 1.030   pH 5.0 5.0 - 8.0   Glucose, UA NEGATIVE NEGATIVE mg/dL   Hgb urine dipstick NEGATIVE NEGATIVE   Bilirubin Urine SMALL (A) NEGATIVE   Ketones, ur NEGATIVE NEGATIVE mg/dL   Protein, ur 30 (A) NEGATIVE mg/dL   Nitrite NEGATIVE NEGATIVE   Leukocytes, UA NEGATIVE NEGATIVE   RBC / HPF 0-5 0 - 5 RBC/hpf   WBC, UA 0-5 0 - 5 WBC/hpf   Bacteria, UA NONE SEEN NONE SEEN   Mucous PRESENT       Imaging Studies:  ImagingResults  US Transvaginal Non-ob  Result Date: 08/17/2016 GYNECOLOGIC SONOGRAM [place] Jarquin is a 41 y.o. LMP 07/24/2016 she is here for a pelvic sonogram for dysmenorrhea,dyspareunia. Uterus                      7.9 x 4.3 x 3.5 cm, homogeneous anteverted uterus,wnl Endometrium          9.2 mm,asymmetrical, multiple echogenic foci w/in the endometrium Right ovary             2 x 1.6 x 1 cm, wnl Left ovary                2.3 x 1.5 x 1 cm, wnl No free fluid Technician Comments: PELVIC US TA/TV: homogeneous anteverted uterus, wnl,normal ov's bilat,ovaries appear mobile,EEC 9.2 mm w/ multiple echogenic foci w/in the endometrium,no free fluid,no pain during ultrasound [employer] 08/17/2016 3:40 PM Clinical Impression and recommendations: I have reviewed the sonogram results above, combined with the patient's current clinical course, below are my impressions and any appropriate recommendations for management based on the  sonographic findings. Uterus is normal with some focal calcifications of the endometrium, possibly small myoma Both ovaries are normal Avon Mergenthaler H 08/17/2016 4:06 PM   US Pelvis Complete  Result Date:  08/17/2016 GYNECOLOGIC SONOGRAM Eldana Isip is a 41 y.o. LMP 07/24/2016 she is here for a pelvic sonogram for dysmenorrhea,dyspareunia. Uterus                      7.9 x 4.3 x 3.5 cm, homogeneous anteverted uterus,wnl Endometrium          9.2 mm,asymmetrical, multiple echogenic foci w/in the endometrium Right ovary             2 x 1.6 x 1 cm, wnl Left ovary                2.3 x 1.5 x 1 cm, wnl No free fluid Technician Comments: PELVIC US TA/TV: homogeneous anteverted uterus, wnl,normal ov's bilat,ovaries appear mobile,EEC 9.2 mm w/ multiple echogenic foci w/in the endometrium,no free fluid,no pain during ultrasound [employer] 08/17/2016 3:40 PM Clinical Impression and recommendations: I have reviewed the sonogram results above, combined with the patient's current clinical course, below are my impressions and any appropriate recommendations for management based on the sonographic findings. Uterus is normal with some focal calcifications of the endometrium, possibly small myoma Both ovaries are normal Marjo Grosvenor H 08/17/2016 4:06 PM       Assessment: Dyspareunia, bump Dysmenorrhea RLQ pain     Patient Active Problem List   Diagnosis Date Noted  . Colitis, ulcerative ([place]) 03/31/2016  . Other specified hypothyroidism 03/18/2016  . WEIGHT LOSS, RECENT 09/23/2009  . NAUSEA WITH VOMITING 09/23/2009  . DIARRHEA, ACUTE 09/23/2009  . EPIGASTRIC PAIN 09/23/2009  . PUD, HX OF 09/23/2009  . ULCERATIVE COLITIS, HX OF 09/23/2009    Plan: Vaginal hysterectomy with removal of right tube and ovary, removal of left tube if possible, preserve the left ovary if possible  Macarthur Lorusso H 09/09/2016 7:07 AM

## 2016-09-09 NOTE — Op Note (Signed)
Preoperative diagnosis:  1.  Dyspareunia, bump                                         2.  Dysmenorrhea/midline pelvic pain                                         3.  RLQ/right pelvic pain                                         4.    Postoperative diagnosis:  Same as above   Procedure:  Vaginal hysterectomy with removal of the right ovary  Surgeon:  Florian Buff MD  Anesthesia:  General Endotracheal  Findings: Patient is having increasingly intolerable menstrual periods They're heavy clots with lots of cramping and pain almost can't function for several days a month She has pain with intercourse every time She also has quite a bit of emotional lability during the month All of her pain is midline she has no left or right third pelvic pain She has a history of 1 C-section after vaginal delivery Her urinalysis today was negative  Intraoperative findings were Uterus with possible adenomyosis or small myomas, nothing significant, right ovary small normal, left ovary with physiological left ovarian corpus luteum  Description of operation:  The patient was taken from the preoperative area to the operating room in stable condition. She was placed in the sitting position and underwent a spinal anesthetic. Once an adequate level of anesthesia was attained she was placed in the dorsal lithotomy position. Patient was prepped and draped in the usual sterile fashion and a Foley catheter was placed.  A weighted speculum was placed and the cervix was grasped with thyroid tenaculums both anteriorly and posteriorly.  0.5% Marcaine plain was injected in a circumferential fashion about the cervix and the electrocautery unit was used to incise the vagina and push at all cervix.  The posterior cul-de-sac was then entered sharply without difficulty.  The uterosacral ligaments were clamped cut and inspection suture ligated and held.  The cardinal ligaments were then clamped cut transfixion suture ligated and cut.  The anterior peritoneum was identified the anterior cul-de-sac was entered sharply without difficulty. The anterior and posterior leaves of the broad ligament were plicated and the uterine vessels were clamped cut and suture ligated. Serial pedicles were taken up the fundus with each pedicle being clamped cut and suture ligated.n I could not visualize clamping across the utero ovarian ligament so I performed a morcellation, cutting the uterus in 3 pieces. The utero-ovarian ligaments were crossclamped the uterus was removed and both pedicles were transfixion suture ligated. The right infundibulo pelvic ligament was cross clamped and double tranfixion suture ligated with good hemostasis. Thus the right ovary was removed and the left ovary was preserved.  There was good hemostasis of all the pedicles. The peritoneum was then closed in a pursestring fashion using 3-0 Vicryl. The anterior posterior vagina was closed in interrupted fashion with good resultant hemostasis. Prior to closure of the vagina the lower pelvis and vagina were irrigated vigorously.  The sponge needle and instrument counts were correct x 3.  Total blood loss for the procedure was  100 cc.  The patient received 2 g of Ancef and 30 mg of Toradol IV preoperatively prophylactically.  She was taken to the recovery room in good stable condition awake alert doing well.  Genise Strack H 09/09/2016, 9:19 AM

## 2016-09-10 DIAGNOSIS — N941 Unspecified dyspareunia: Secondary | ICD-10-CM | POA: Diagnosis not present

## 2016-09-10 LAB — BASIC METABOLIC PANEL
Anion gap: 9 (ref 5–15)
BUN: 13 mg/dL (ref 6–20)
CHLORIDE: 105 mmol/L (ref 101–111)
CO2: 22 mmol/L (ref 22–32)
CREATININE: 0.93 mg/dL (ref 0.44–1.00)
Calcium: 9.1 mg/dL (ref 8.9–10.3)
Glucose, Bld: 121 mg/dL — ABNORMAL HIGH (ref 65–99)
Potassium: 3.9 mmol/L (ref 3.5–5.1)
SODIUM: 136 mmol/L (ref 135–145)

## 2016-09-10 LAB — CBC
HCT: 37.8 % (ref 36.0–46.0)
Hemoglobin: 12.3 g/dL (ref 12.0–15.0)
MCH: 31.1 pg (ref 26.0–34.0)
MCHC: 32.5 g/dL (ref 30.0–36.0)
MCV: 95.7 fL (ref 78.0–100.0)
Platelets: 415 10*3/uL — ABNORMAL HIGH (ref 150–400)
RBC: 3.95 MIL/uL (ref 3.87–5.11)
RDW: 13.6 % (ref 11.5–15.5)
WBC: 19.4 10*3/uL — ABNORMAL HIGH (ref 4.0–10.5)

## 2016-09-10 MED ORDER — OXYCODONE-ACETAMINOPHEN 5-325 MG PO TABS: 1.0000 | ORAL_TABLET | ORAL | 0 refills | Status: DC | PRN

## 2016-09-10 MED ORDER — CIPROFLOXACIN HCL 500 MG PO TABS: 500.0000 mg | ORAL_TABLET | Freq: Two times a day (BID) | ORAL | 0 refills | Status: DC

## 2016-09-10 MED ORDER — PROMETHAZINE HCL 25 MG PO TABS: 25.0000 mg | ORAL_TABLET | Freq: Four times a day (QID) | ORAL | 1 refills | Status: DC | PRN

## 2016-09-10 NOTE — Anesthesia Postprocedure Evaluation (Signed)
Anesthesia Post Note  Patient: Shanese Baughn  Procedure(s) Performed: Procedure(s) (LRB): HYSTERECTOMY VAGINAL (N/A) RIGHT OOPHORECTOMY (Right)  Patient location during evaluation: Nursing Unit Anesthesia Type: General Level of consciousness: awake and alert and oriented Pain management: pain level controlled Vital Signs Assessment: post-procedure vital signs reviewed and stable Respiratory status: spontaneous breathing and respiratory function stable Cardiovascular status: stable Postop Assessment: no signs of nausea or vomiting Anesthetic complications: no     Last Vitals:  Vitals:   09/10/16 0030 09/10/16 0430  BP: 118/68 112/69  Pulse: 95 (!) 103  Resp: 18 18  Temp: 36.8 C 36.5 C    Last Pain:  Vitals:   09/10/16 0658  TempSrc:   PainSc: 7                  ADAMS, AMY A

## 2016-09-10 NOTE — Addendum Note (Signed)
Addendum  created 09/10/16 2563 by Mickel Baas, CRNA   Sign clinical note

## 2016-09-10 NOTE — Progress Notes (Signed)
IV discontinued, catheter intact. Discharge instructions given on medications and follow up visits, patient verbalized understanding. Prescriptions sent to Pharmacy of choice documented on AVS, and patient also given a paper prescription.  Accompanied by staff to an awaiting vehicle.

## 2016-09-10 NOTE — Progress Notes (Signed)
Foley d/c POD1. Pt tolerated well.

## 2016-09-10 NOTE — Discharge Summary (Signed)
Physician Discharge Summary  Patient ID: Allice Garro MRN: [id number] DOB/AGE: Jan 04, 1976 41 y.o.  Admit date: 09/09/2016 Discharge date: 09/10/2016  Admission Diagnoses: Dyspareunia, dysmenorrhea  Discharge Diagnoses:  Active Problems:   S/P vaginal hysterectomy   Discharged Condition: good  Hospital Course: unremarkable post operative course  Consults: None  Significant Diagnostic Studies: labs:   Treatments: surgery: TVH RSO  Discharge Exam: Blood pressure 112/69, pulse (!) 103, temperature 97.7 F (36.5 C), temperature source Oral, resp. rate 18, SpO2 95 %. General appearance: alert, cooperative and no distress GI: soft, non-tender; bowel sounds normal; no masses,  no organomegaly  Disposition: 01-Home or Self Care  Discharge Instructions    Call MD for:  persistant nausea and vomiting    Complete by:  As directed    Call MD for:  severe uncontrolled pain    Complete by:  As directed    Call MD for:  temperature >100.4    Complete by:  As directed    Diet - low sodium heart healthy    Complete by:  As directed    Driving Restrictions    Complete by:  As directed    No driving for a week   Increase activity slowly    Complete by:  As directed    Lifting restrictions    Complete by:  As directed    Do not lift more than 10 pounds   No wound care    Complete by:  As directed    Sexual Activity Restrictions    Complete by:  As directed    No sex for 8 weeks:  Wait for Dr Brynda Greathouse instructions on resumption     Allergies as of 09/10/2016      Reactions   Doxycycline Nausea And Vomiting   Sulfonamide Derivatives Nausea And Vomiting      Medication List    STOP taking these medications   HYDROcodone-acetaminophen 5-325 MG tablet Commonly known as:  NORCO/VICODIN   nitrofurantoin (macrocrystal-monohydrate) 100 MG capsule Commonly known as:  MACROBID     TAKE these medications   ALPRAZolam 1 MG tablet Commonly known as:  XANAX Take 1 mg by mouth 3  (three) times daily as needed for anxiety.   amitriptyline 150 MG tablet Commonly known as:  ELAVIL Take 150 mg by mouth at bedtime.   ciprofloxacin 500 MG tablet Commonly known as:  CIPRO Take 1 tablet (500 mg total) by mouth 2 (two) times daily. What changed:  when to take this   clonazePAM 1 MG tablet Commonly known as:  KLONOPIN Take 0.5 mg by mouth 2 (two) times daily as needed. For anxiety/nerves   cloNIDine 0.1 MG tablet Commonly known as:  CATAPRES Take 0.1 mg by mouth 2 (two) times daily.   ibuprofen 200 MG tablet Commonly known as:  ADVIL,MOTRIN Take 400-600 mg by mouth every 8 (eight) hours as needed (for pain/headaches.).   levothyroxine 137 MCG tablet Commonly known as:  SYNTHROID, LEVOTHROID Take 1 tablet (137 mcg total) by mouth daily before breakfast.   Melatonin 5 MG Tabs Take 5 mg by mouth at bedtime as needed (for sleep.).   oxyCODONE-acetaminophen 5-325 MG tablet Commonly known as:  PERCOCET/ROXICET Take 1-2 tablets by mouth every 4 (four) hours as needed (moderate to severe pain (when tolerating fluids)).   phenazopyridine 200 MG tablet Commonly known as:  PYRIDIUM Take 1 tablet (200 mg total) by mouth 3 (three) times daily as needed for pain.   promethazine 25 MG tablet Commonly known  as:  PHENERGAN Take 1 tablet (25 mg total) by mouth every 6 (six) hours as needed for nausea.   SSD 1 % cream Generic drug:  silver sulfADIAZINE Apply 1 application topically 2 (two) times daily.   tiZANidine 4 MG tablet Commonly known as:  ZANAFLEX Take 4-8 mg by mouth See admin instructions. 4 mg daily as needed for muscle spasms, tension, or mild anxiety & 4-8 mg at bedtime as needed for sleep/anxiety/spasms.   topiramate 25 MG tablet Commonly known as:  TOPAMAX Take 25 mg by mouth 2 (two) times daily.   topiramate 50 MG tablet Commonly known as:  TOPAMAX Take 50 mg by mouth daily.      Follow-up Information    Florian Buff, MD Follow up on  09/17/2016.   Specialties:  Obstetrics and Gynecology, Radiology Contact information: [place] [address] [phone]           Signed: Florian Buff 09/10/2016, 2:19 PM

## 2016-09-10 NOTE — Discharge Instructions (Signed)
Vaginal Hysterectomy, Care After Refer to this sheet in the next few weeks. These instructions provide you with information about caring for yourself after your procedure. Your health care provider may also give you more specific instructions. Your treatment has been planned according to current medical practices, but problems sometimes occur. Call your health care provider if you have any problems or questions after your procedure. What can I expect after the procedure? After the procedure, it is common to have:  Pain.  Soreness and numbness in your incision areas.  Vaginal bleeding and discharge.  Constipation.  Temporary problems emptying the bladder.  Feelings of sadness or other emotions. Follow these instructions at home: Medicines   Take over-the-counter and prescription medicines only as told by your health care provider.  If you were prescribed an antibiotic medicine, take it as told by your health care provider. Do not stop taking the antibiotic even if you start to feel better.  Do not drive or operate heavy machinery while taking prescription pain medicine. Activity   Return to your normal activities as told by your health care provider. Ask your health care provider what activities are safe for you.  Get regular exercise as told by your health care provider. You may be told to take short walks every day and go farther each time.  Do not lift anything that is heavier than 10 lb (4.5 kg). General instructions    Do not put anything in your vagina for 6 weeks after your surgery or as told by your health care provider. This includes tampons and douches.  Do not have sex until your health care provider says you can.  Do not take baths, swim, or use a hot tub until your health care provider approves.  Drink enough fluid to keep your urine clear or pale yellow.  Do not drive for 24 hours if you were given a sedative.  Keep all follow-up visits as told by your health  care provider. This is important. Contact a health care provider if:  Your pain medicine is not helping.  You have a fever.  You have redness, swelling, or pain at your incision site.  You have blood, pus, or a bad-smelling discharge from your vagina.  You continue to have difficulty urinating. Get help right away if:  You have severe abdominal or back pain.  You have heavy bleeding from your vagina.  You have chest pain or shortness of breath. This information is not intended to replace advice given to you by your health care provider. Make sure you discuss any questions you have with your health care provider. Document Released: 09/30/2015 Document Revised: 11/14/2015 Document Reviewed: 06/23/2015 Elsevier Interactive Patient Education  2017 [employer].

## 2016-09-10 NOTE — Progress Notes (Signed)
Pt ambulated from room down the hall to nurses station and back to room. Tolerated well, but was in a significant amount of pain when she returned to bed and was given IV pain medication.

## 2016-09-11 ENCOUNTER — Encounter (HOSPITAL_COMMUNITY): Payer: Self-pay | Admitting: Obstetrics & Gynecology

## 2016-09-14 ENCOUNTER — Telehealth: Payer: Self-pay | Admitting: Obstetrics & Gynecology

## 2016-09-14 ENCOUNTER — Other Ambulatory Visit (HOSPITAL_COMMUNITY): Payer: Self-pay | Admitting: Obstetrics & Gynecology

## 2016-09-14 MED ORDER — OXYCODONE-ACETAMINOPHEN 5-325 MG PO TABS: 1.0000 | ORAL_TABLET | ORAL | 0 refills | Status: DC | PRN

## 2016-09-17 ENCOUNTER — Encounter: Payer: Self-pay | Admitting: Obstetrics & Gynecology

## 2016-09-17 ENCOUNTER — Ambulatory Visit (INDEPENDENT_AMBULATORY_CARE_PROVIDER_SITE_OTHER): Payer: BLUE CROSS/BLUE SHIELD | Admitting: Obstetrics & Gynecology

## 2016-09-17 VITALS — BP 126/82 | HR 104 | Wt 187.0 lb

## 2016-09-17 DIAGNOSIS — Z9889 Other specified postprocedural states: Secondary | ICD-10-CM

## 2016-09-17 MED ORDER — OXYCODONE-ACETAMINOPHEN 5-325 MG PO TABS: 1.0000 | ORAL_TABLET | ORAL | 0 refills | Status: DC | PRN

## 2016-09-17 NOTE — Progress Notes (Signed)
  HPI: Patient returns for routine postoperative follow-up having undergone TVH/Right oophrectomy on 09/09/2016.  The patient's immediate postoperative recovery has been unremarkable. Since hospital discharge the patient reports husband is taking her pain meds, she knows she will get 1 more prescription of her pain meds. I talked with her extensively regarding this, she has gone to [facility]   Current Outpatient Prescriptions: ALPRAZolam (XANAX) 1 MG tablet, Take 1 mg by mouth 3 (three) times daily as needed for anxiety., Disp: , Rfl:  amitriptyline (ELAVIL) 150 MG tablet, Take 150 mg by mouth at bedtime., Disp: , Rfl:  cloNIDine (CATAPRES) 0.1 MG tablet, Take 0.1 mg by mouth 2 (two) times daily., Disp: , Rfl:  ibuprofen (ADVIL,MOTRIN) 200 MG tablet, Take 400-600 mg by mouth every 8 (eight) hours as needed (for pain/headaches.)., Disp: , Rfl:  levothyroxine (SYNTHROID, LEVOTHROID) 137 MCG tablet, Take 1 tablet (137 mcg total) by mouth daily before breakfast., Disp: 30 tablet, Rfl: 5 oxyCODONE-acetaminophen (PERCOCET/ROXICET) 5-325 MG tablet, Take 1-2 tablets by mouth every 4 (four) hours as needed (moderate to severe pain (when tolerating fluids))., Disp: 24 tablet, Rfl: 0 promethazine (PHENERGAN) 25 MG tablet, Take 1 tablet (25 mg total) by mouth every 6 (six) hours as needed for nausea., Disp: 30 tablet, Rfl: 1 SSD 1 % cream, Apply 1 application topically 2 (two) times daily., Disp: , Rfl:  tiZANidine (ZANAFLEX) 4 MG tablet, Take 4-8 mg by mouth See admin instructions. 4 mg daily as needed for muscle spasms, tension, or mild anxiety & 4-8 mg at bedtime as needed for sleep/anxiety/spasms., Disp: , Rfl:  topiramate (TOPAMAX) 25 MG tablet, Take 25 mg by mouth 2 (two) times daily., Disp: , Rfl:  topiramate (TOPAMAX) 50 MG tablet, Take 50 mg by mouth daily., Disp: , Rfl:   clonazePAM (KLONOPIN) 1 MG tablet, Take 0.5 mg by mouth 2 (two) times daily as needed. For anxiety/nerves, Disp: , Rfl:  Melatonin  5 MG TABS, Take 5 mg by mouth at bedtime as needed (for sleep.)., Disp: , Rfl:  phenazopyridine (PYRIDIUM) 200 MG tablet, Take 1 tablet (200 mg total) by mouth 3 (three) times daily as needed for pain. (Patient not taking: Reported on 09/01/2016), Disp: 10 tablet, Rfl: 2  No current facility-administered medications for this visit.     Blood pressure 126/82, pulse (!) 104, weight 187 lb (84.8 kg), last menstrual period 07/24/2016.  Physical Exam: Abdomen soft Vagina normal post op  Diagnostic Tests:   Pathology: Adenomyosis otherwise negative  Impression: s/p TVH right oophorectomy  Plan: No sex Follow up 3 weeks  Follow up: 3  weeks   Meds ordered this encounter  Medications  . oxyCODONE-acetaminophen (PERCOCET/ROXICET) 5-325 MG tablet    Sig: Take 1-2 tablets by mouth every 4 (four) hours as needed (moderate to severe pain (when tolerating fluids)).    Dispense:  24 tablet    Refill:  0     Florian Buff, MD

## 2016-09-22 ENCOUNTER — Telehealth: Payer: Self-pay | Admitting: Orthopaedic Surgery

## 2016-09-22 NOTE — Telephone Encounter (Signed)
Patient called for refill - states she has finished taking the pain medication prescribed by Dr Elonda Husky, post surgery. Requests refill on medication:   HYDROcodone-acetaminophen (NORCO/VICODIN) 5-325 MG tablet

## 2016-09-23 NOTE — Telephone Encounter (Signed)
Called back to patient; notified.

## 2016-09-23 NOTE — Telephone Encounter (Signed)
No.  She has had 98 pills of Oxycodone prescribed and filled since 09-10-16.  Would need to wait until 10-11-16 before I can even consider a refill.  Trying to abide by new state rules and law.

## 2016-10-07 ENCOUNTER — Ambulatory Visit (INDEPENDENT_AMBULATORY_CARE_PROVIDER_SITE_OTHER): Payer: BLUE CROSS/BLUE SHIELD | Admitting: Orthopaedic Surgery

## 2016-10-07 ENCOUNTER — Encounter: Payer: Self-pay | Admitting: Orthopaedic Surgery

## 2016-10-07 VITALS — BP 113/83 | HR 113 | Temp 97.9°F | Ht 63.0 in | Wt 178.0 lb

## 2016-10-07 DIAGNOSIS — M545 Low back pain: Secondary | ICD-10-CM | POA: Diagnosis not present

## 2016-10-07 DIAGNOSIS — F17210 Nicotine dependence, cigarettes, uncomplicated: Secondary | ICD-10-CM

## 2016-10-07 DIAGNOSIS — G8929 Other chronic pain: Secondary | ICD-10-CM

## 2016-10-07 MED ORDER — HYDROCODONE-ACETAMINOPHEN 5-325 MG PO TABS: 1.0000 | ORAL_TABLET | Freq: Four times a day (QID) | ORAL | 0 refills | Status: DC | PRN

## 2016-10-07 NOTE — Progress Notes (Signed)
Patient Vanessa Bowen, female DOB:[birth year], 41 y.o. OAC:[id number]  Chief Complaint  Patient presents with  . Follow-up    back pain    HPI  Vanessa Bowen is a 41 y.o. female who has chronic lower back pain.  She has had a hysterectomy and is doing well from that.  Her gynocologist has released her.  I will resume care for her pain medicine.   She has no weakness, no spasm of the back.  She has no trauma.  She has some right sided sciatica at times. HPI  Body mass index is 31.53 kg/m.  ROS  Review of Systems  HENT: Negative for congestion.   Respiratory: Negative for cough and shortness of breath.   Cardiovascular: Negative for chest pain and leg swelling.  Endocrine: Positive for cold intolerance.  Musculoskeletal: Positive for arthralgias and back pain.  Allergic/Immunologic: Positive for environmental allergies.    Past Medical History:  Diagnosis Date  . Anxiety   . Arthritis   . Chronic back pain   . Colitis, ulcerative ([place])   . Depression   . Hypothyroidism   . Migraine     Past Surgical History:  Procedure Laterality Date  . ANKLE SURGERY    . breast augmentation    . CESAREAN SECTION    . KNEE SURGERY    . LAPAROSCOPIC BILATERAL SALPINGECTOMY  07/27/2012   Procedure: LAPAROSCOPIC BILATERAL SALPINGECTOMY;  Surgeon: Florian Buff, MD;  Location: [facility];  Service: Gynecology;  Laterality: N/A;  . OOPHORECTOMY Right 09/09/2016   Procedure: RIGHT OOPHORECTOMY;  Surgeon: Florian Buff, MD;  Location: [facility];  Service: Gynecology;  Laterality: Right;  . OVARIAN CYST REMOVAL    . VAGINAL HYSTERECTOMY N/A 09/09/2016   Procedure: HYSTERECTOMY VAGINAL;  Surgeon: Florian Buff, MD;  Location: [facility];  Service: Gynecology;  Laterality: N/A;    Family History  Problem Relation Age of Onset  . Cancer Maternal Grandmother   . Cancer Maternal Grandfather     Social History Social History  Substance Use Topics  . Smoking status: Current Every Day Smoker   Packs/day: 0.50    Years: 15.00    Types: Cigarettes  . Smokeless tobacco: Never Used  . Alcohol use Yes     Comment: occasionally    Allergies  Allergen Reactions  . Doxycycline Nausea And Vomiting  . Sulfonamide Derivatives Nausea And Vomiting    Current Outpatient Prescriptions  Medication Sig Dispense Refill  . ALPRAZolam (XANAX) 1 MG tablet Take 1 mg by mouth 3 (three) times daily as needed for anxiety.    Marland Kitchen amitriptyline (ELAVIL) 150 MG tablet Take 150 mg by mouth at bedtime.    . clonazePAM (KLONOPIN) 1 MG tablet Take 0.5 mg by mouth 2 (two) times daily as needed. For anxiety/nerves    . cloNIDine (CATAPRES) 0.1 MG tablet Take 0.1 mg by mouth 2 (two) times daily.    Marland Kitchen HYDROcodone-acetaminophen (NORCO/VICODIN) 5-325 MG tablet Take 1 tablet by mouth every 6 (six) hours as needed for moderate pain (Must last 30 days.Do not take and drive a car or use machinery.). 110 tablet 0  . ibuprofen (ADVIL,MOTRIN) 200 MG tablet Take 400-600 mg by mouth every 8 (eight) hours as needed (for pain/headaches.).    Marland Kitchen levothyroxine (SYNTHROID, LEVOTHROID) 137 MCG tablet Take 1 tablet (137 mcg total) by mouth daily before breakfast. 30 tablet 5  . Melatonin 5 MG TABS Take 5 mg by mouth at bedtime as needed (for sleep.).    Marland Kitchen  oxyCODONE-acetaminophen (PERCOCET/ROXICET) 5-325 MG tablet Take 1-2 tablets by mouth every 4 (four) hours as needed (moderate to severe pain (when tolerating fluids)). 24 tablet 0  . phenazopyridine (PYRIDIUM) 200 MG tablet Take 1 tablet (200 mg total) by mouth 3 (three) times daily as needed for pain. (Patient not taking: Reported on 09/01/2016) 10 tablet 2  . promethazine (PHENERGAN) 25 MG tablet Take 1 tablet (25 mg total) by mouth every 6 (six) hours as needed for nausea. 30 tablet 1  . SSD 1 % cream Apply 1 application topically 2 (two) times daily.    Marland Kitchen tiZANidine (ZANAFLEX) 4 MG tablet Take 4-8 mg by mouth See admin instructions. 4 mg daily as needed for muscle spasms,  tension, or mild anxiety & 4-8 mg at bedtime as needed for sleep/anxiety/spasms.    Marland Kitchen topiramate (TOPAMAX) 25 MG tablet Take 25 mg by mouth 2 (two) times daily.    Marland Kitchen topiramate (TOPAMAX) 50 MG tablet Take 50 mg by mouth daily.     No current facility-administered medications for this visit.      Physical Exam  Blood pressure 113/83, pulse (!) 113, temperature 97.9 F (36.6 C), height 5' 3"  (1.6 m), weight 178 lb (80.7 kg), last menstrual period 07/24/2016.  Constitutional: overall normal hygiene, normal nutrition, well developed, normal grooming, normal body habitus. Assistive device:none  Musculoskeletal: gait and station Limp none, muscle tone and strength are normal, no tremors or atrophy is present.  .  Neurological: coordination overall normal.  Deep tendon reflex/nerve stretch intact.  Sensation normal.  Cranial nerves II-XII intact.   Skin:   Normal overall no scars, lesions, ulcers or rashes. No psoriasis.  Psychiatric: Alert and oriented x 3.  Recent memory intact, remote memory unclear.  Normal mood and affect. Well groomed.  Good eye contact.  Cardiovascular: overall no swelling, no varicosities, no edema bilaterally, normal temperatures of the legs and arms, no clubbing, cyanosis and good capillary refill.  Lymphatic: palpation is normal.  Spine/Pelvis examination:  Inspection:  Overall, sacoiliac joint benign and hips nontender; without crepitus or defects.   Thoracic spine inspection: Alignment normal without kyphosis present   Lumbar spine inspection:  Alignment  with normal lumbar lordosis, without scoliosis apparent.   Thoracic spine palpation:  without tenderness of spinal processes   Lumbar spine palpation: with tenderness of lumbar area; without tightness of lumbar muscles    Range of Motion:   Lumbar flexion, forward flexion is 35 without pain or tenderness    Lumbar extension is 10 without pain or tenderness   Left lateral bend is Normal  without pain  or tenderness   Right lateral bend is Normal without pain or tenderness   Straight leg raising is Normal   Strength & tone: Normal   Stability overall normal stability     The patient has been educated about the nature of the problem(s) and counseled on treatment options.  The patient appeared to understand what I have discussed and is in agreement with it.  Encounter Diagnoses  Name Primary?  . Chronic midline low back pain without sciatica Yes  . Cigarette nicotine dependence without complication     PLAN Call if any problems.  Precautions discussed.  Continue current medications.   Return to clinic 1 month   I have reviewed the [place] web site prior to prescribing narcotic medicine for this patient.  Electronically Signed Sanjuana Kava, MD 4/18/20182:42 PM

## 2016-10-07 NOTE — Patient Instructions (Addendum)
Steps to Quit Smoking Smoking tobacco can be bad for your health. It can also affect almost every organ in your body. Smoking puts you and people around you at risk for many serious long-lasting (chronic) diseases. Quitting smoking is hard, but it is one of the best things that you can do for your health. It is never too late to quit. What are the benefits of quitting smoking? When you quit smoking, you lower your risk for getting serious diseases and conditions. They can include:  Lung cancer or lung disease.  Heart disease.  Stroke.  Heart attack.  Not being able to have children (infertility).  Weak bones (osteoporosis) and broken bones (fractures). If you have coughing, wheezing, and shortness of breath, those symptoms may get better when you quit. You may also get sick less often. If you are pregnant, quitting smoking can help to lower your chances of having a baby of low birth weight. What can I do to help me quit smoking? Talk with your doctor about what can help you quit smoking. Some things you can do (strategies) include:  Quitting smoking totally, instead of slowly cutting back how much you smoke over a period of time.  Going to in-person counseling. You are more likely to quit if you go to many counseling sessions.  Using resources and support systems, such as:  Online chats with a Social worker.  Phone quitlines.  Printed Furniture conservator/restorer.  Support groups or group counseling.  Text messaging programs.  Mobile phone apps or applications.  Taking medicines. Some of these medicines may have nicotine in them. If you are pregnant or breastfeeding, do not take any medicines to quit smoking unless your doctor says it is okay. Talk with your doctor about counseling or other things that can help you. Talk with your doctor about using more than one strategy at the same time, such as taking medicines while you are also going to in-person counseling. This can help make quitting  easier. What things can I do to make it easier to quit? Quitting smoking might feel very hard at first, but there is a lot that you can do to make it easier. Take these steps:  Talk to your family and friends. Ask them to support and encourage you.  Call phone quitlines, reach out to support groups, or work with a Social worker.  Ask people who smoke to not smoke around you.  Avoid places that make you want (trigger) to smoke, such as:  Bars.  Parties.  Smoke-break areas at work.  Spend time with people who do not smoke.  Lower the stress in your life. Stress can make you want to smoke. Try these things to help your stress:  Getting regular exercise.  Deep-breathing exercises.  Yoga.  Meditating.  Doing a body scan. To do this, close your eyes, focus on one area of your body at a time from head to toe, and notice which parts of your body are tense. Try to relax the muscles in those areas.  Download or buy apps on your mobile phone or tablet that can help you stick to your quit plan. There are many free apps, such as QuitGuide from the [employer] Office manager for Disease Control and Prevention). You can find more support from smokefree.gov and other websites. This information is not intended to replace advice given to you by your health care provider. Make sure you discuss any questions you have with your health care provider. Document Released: 04/04/2009 Document Revised: 02/04/2016 Document  Reviewed: 10/23/2014 Elsevier Interactive Patient Education  2017 [place].  Back Exercises If you have pain in your back, do these exercises 2-3 times each day or as told by your doctor. When the pain goes away, do the exercises once each day, but repeat the steps more times for each exercise (do more repetitions). If you do not have pain in your back, do these exercises once each day or as told by your doctor. Exercises Single Knee to Chest   Do these steps 3-5 times in a row for each  leg: 1. Lie on your back on a firm bed or the floor with your legs stretched out. 2. Bring one knee to your chest. 3. Hold your knee to your chest by grabbing your knee or thigh. 4. Pull on your knee until you feel a gentle stretch in your lower back. 5. Keep doing the stretch for 10-30 seconds. 6. Slowly let go of your leg and straighten it. Pelvic Tilt   Do these steps 5-10 times in a row: 1. Lie on your back on a firm bed or the floor with your legs stretched out. 2. Bend your knees so they point up to the ceiling. Your feet should be flat on the floor. 3. Tighten your lower belly (abdomen) muscles to press your lower back against the floor. This will make your tailbone point up to the ceiling instead of pointing down to your feet or the floor. 4. Stay in this position for 5-10 seconds while you gently tighten your muscles and breathe evenly. Cat-Cow   Do these steps until your lower back bends more easily: 1. Get on your hands and knees on a firm surface. Keep your hands under your shoulders, and keep your knees under your hips. You may put padding under your knees. 2. Let your head hang down, and make your tailbone point down to the floor so your lower back is round like the back of a cat. 3. Stay in this position for 5 seconds. 4. Slowly lift your head and make your tailbone point up to the ceiling so your back hangs low (sags) like the back of a cow. 5. Stay in this position for 5 seconds. Press-Ups   Do these steps 5-10 times in a row: 1. Lie on your belly (face-down) on the floor. 2. Place your hands near your head, about shoulder-width apart. 3. While you keep your back relaxed and keep your hips on the floor, slowly straighten your arms to raise the top half of your body and lift your shoulders. Do not use your back muscles. To make yourself more comfortable, you may change where you place your hands. 4. Stay in this position for 5 seconds. 5. Slowly return to lying flat on  the floor. Bridges   Do these steps 10 times in a row: 1. Lie on your back on a firm surface. 2. Bend your knees so they point up to the ceiling. Your feet should be flat on the floor. 3. Tighten your butt muscles and lift your butt off of the floor until your waist is almost as high as your knees. If you do not feel the muscles working in your butt and the back of your thighs, slide your feet 1-2 inches farther away from your butt. 4. Stay in this position for 3-5 seconds. 5. Slowly lower your butt to the floor, and let your butt muscles relax. If this exercise is too easy, try doing it with your arms crossed  over your chest. Belly Crunches   Do these steps 5-10 times in a row: 1. Lie on your back on a firm bed or the floor with your legs stretched out. 2. Bend your knees so they point up to the ceiling. Your feet should be flat on the floor. 3. Cross your arms over your chest. 4. Tip your chin a little bit toward your chest but do not bend your neck. 5. Tighten your belly muscles and slowly raise your chest just enough to lift your shoulder blades a tiny bit off of the floor. 6. Slowly lower your chest and your head to the floor. Back Lifts  Do these steps 5-10 times in a row: 1. Lie on your belly (face-down) with your arms at your sides, and rest your forehead on the floor. 2. Tighten the muscles in your legs and your butt. 3. Slowly lift your chest off of the floor while you keep your hips on the floor. Keep the back of your head in line with the curve in your back. Look at the floor while you do this. 4. Stay in this position for 3-5 seconds. 5. Slowly lower your chest and your face to the floor. Contact a doctor if:  Your back pain gets a lot worse when you do an exercise.  Your back pain does not lessen 2 hours after you exercise. If you have any of these problems, stop doing the exercises. Do not do them again unless your doctor says it is okay. Get help right away if:  You  have sudden, very bad back pain. If this happens, stop doing the exercises. Do not do them again unless your doctor says it is okay. This information is not intended to replace advice given to you by your health care provider. Make sure you discuss any questions you have with your health care provider. Document Released: 07/11/2010 Document Revised: 11/14/2015 Document Reviewed: 08/02/2014 Elsevier Interactive Patient Education  2017 [employer].

## 2016-10-08 ENCOUNTER — Ambulatory Visit (INDEPENDENT_AMBULATORY_CARE_PROVIDER_SITE_OTHER): Payer: BLUE CROSS/BLUE SHIELD | Admitting: Obstetrics & Gynecology

## 2016-10-08 ENCOUNTER — Encounter: Payer: Self-pay | Admitting: Obstetrics & Gynecology

## 2016-10-08 VITALS — BP 108/64 | HR 72 | Wt 180.0 lb

## 2016-10-08 DIAGNOSIS — Z9889 Other specified postprocedural states: Secondary | ICD-10-CM

## 2016-10-08 MED ORDER — METRONIDAZOLE 0.75 % VA GEL: VAGINAL | 0 refills | Status: DC

## 2016-10-08 NOTE — Progress Notes (Signed)
  HPI: Patient returns for routine postoperative follow-up having undergone TVH RSO on 09/09/2016.  The patient's immediate postoperative recovery has been unremarkable. Since hospital discharge the patient reports some increased discharge no bleeding some tenderness with some movements.   Current Outpatient Prescriptions: amitriptyline (ELAVIL) 150 MG tablet, Take 150 mg by mouth at bedtime., Disp: , Rfl:  clonazePAM (KLONOPIN) 1 MG tablet, Take 0.5 mg by mouth 2 (two) times daily as needed. For anxiety/nerves, Disp: , Rfl:  cloNIDine (CATAPRES) 0.1 MG tablet, Take 0.1 mg by mouth 2 (two) times daily., Disp: , Rfl:  HYDROcodone-acetaminophen (NORCO/VICODIN) 5-325 MG tablet, Take 1 tablet by mouth every 6 (six) hours as needed for moderate pain (Must last 30 days.Do not take and drive a car or use machinery.)., Disp: 110 tablet, Rfl: 0 ibuprofen (ADVIL,MOTRIN) 200 MG tablet, Take 400-600 mg by mouth every 8 (eight) hours as needed (for pain/headaches.)., Disp: , Rfl:  levothyroxine (SYNTHROID, LEVOTHROID) 137 MCG tablet, Take 1 tablet (137 mcg total) by mouth daily before breakfast., Disp: 30 tablet, Rfl: 5 SSD 1 % cream, Apply 1 application topically 2 (two) times daily., Disp: , Rfl:  tiZANidine (ZANAFLEX) 4 MG tablet, Take 4-8 mg by mouth See admin instructions. 4 mg daily as needed for muscle spasms, tension, or mild anxiety & 4-8 mg at bedtime as needed for sleep/anxiety/spasms., Disp: , Rfl:  topiramate (TOPAMAX) 25 MG tablet, Take 25 mg by mouth 2 (two) times daily., Disp: , Rfl:  topiramate (TOPAMAX) 50 MG tablet, Take 50 mg by mouth daily., Disp: , Rfl:   ALPRAZolam (XANAX) 1 MG tablet, Take 1 mg by mouth 3 (three) times daily as needed for anxiety., Disp: , Rfl:  Melatonin 5 MG TABS, Take 5 mg by mouth at bedtime as needed (for sleep.)., Disp: , Rfl:  oxyCODONE-acetaminophen (PERCOCET/ROXICET) 5-325 MG tablet, Take 1-2 tablets by mouth every 4 (four) hours as needed (moderate to severe  pain (when tolerating fluids)). (Patient not taking: Reported on 10/08/2016), Disp: 24 tablet, Rfl: 0 promethazine (PHENERGAN) 25 MG tablet, Take 1 tablet (25 mg total) by mouth every 6 (six) hours as needed for nausea. (Patient not taking: Reported on 10/08/2016), Disp: 30 tablet, Rfl: 1  No current facility-administered medications for this visit.     Blood pressure 108/64, pulse 72, weight 180 lb (81.6 kg), last menstrual period 07/24/2016.  Physical Exam: Abdomen soft non tender benign Cuff intact sutures starting to break down No hematoma or cuff tenderness, +BV  Diagnostic Tests:   Pathology: benign  Impression: s/p TVH RSO  Plan: Meds ordered this encounter  Medications  . metroNIDAZOLE (METROGEL VAGINAL) 0.75 % vaginal gel    Sig: Nightly x 5 nights    Dispense:  70 g    Refill:  0     Follow up: 3  weeks  Florian Buff, MD

## 2016-10-13 ENCOUNTER — Ambulatory Visit: Payer: BLUE CROSS/BLUE SHIELD | Admitting: Orthopaedic Surgery

## 2016-10-27 ENCOUNTER — Encounter: Payer: Self-pay | Admitting: Obstetrics & Gynecology

## 2016-10-27 ENCOUNTER — Ambulatory Visit (INDEPENDENT_AMBULATORY_CARE_PROVIDER_SITE_OTHER): Payer: Medicaid Other | Admitting: Obstetrics & Gynecology

## 2016-10-27 VITALS — BP 112/80 | HR 90 | Ht 63.0 in | Wt 180.5 lb

## 2016-10-27 DIAGNOSIS — Z9889 Other specified postprocedural states: Secondary | ICD-10-CM

## 2016-10-27 NOTE — Progress Notes (Signed)
  HPI: Patient returns for routine postoperative follow-up having undergone TVH RSO on 09/09/2016.  The patient's immediate postoperative recovery has been unremarkable. Since hospital discharge the patient reports no problems, back at work.   Current Outpatient Prescriptions: amitriptyline (ELAVIL) 150 MG tablet, Take 150 mg by mouth at bedtime., Disp: , Rfl:  clonazePAM (KLONOPIN) 1 MG tablet, Take 0.5 mg by mouth 2 (two) times daily as needed. For anxiety/nerves, Disp: , Rfl:  cloNIDine (CATAPRES) 0.1 MG tablet, Take 0.1 mg by mouth 2 (two) times daily., Disp: , Rfl:  HYDROcodone-acetaminophen (NORCO/VICODIN) 5-325 MG tablet, Take 1 tablet by mouth every 6 (six) hours as needed for moderate pain (Must last 30 days.Do not take and drive a car or use machinery.)., Disp: 110 tablet, Rfl: 0 ibuprofen (ADVIL,MOTRIN) 200 MG tablet, Take 400-600 mg by mouth every 8 (eight) hours as needed (for pain/headaches.)., Disp: , Rfl:  levothyroxine (SYNTHROID, LEVOTHROID) 137 MCG tablet, Take 1 tablet (137 mcg total) by mouth daily before breakfast., Disp: 30 tablet, Rfl: 5 Melatonin 5 MG TABS, Take 5 mg by mouth at bedtime as needed (for sleep.)., Disp: , Rfl:  SSD 1 % cream, Apply 1 application topically 2 (two) times daily., Disp: , Rfl:  tiZANidine (ZANAFLEX) 4 MG tablet, Take 4-8 mg by mouth See admin instructions. 4 mg daily as needed for muscle spasms, tension, or mild anxiety & 4-8 mg at bedtime as needed for sleep/anxiety/spasms., Disp: , Rfl:  topiramate (TOPAMAX) 25 MG tablet, Take 25 mg by mouth 2 (two) times daily., Disp: , Rfl:  topiramate (TOPAMAX) 50 MG tablet, Take 50 mg by mouth daily., Disp: , Rfl:    No current facility-administered medications for this visit.     Blood pressure 112/80, pulse 90, height 5' 3"  (1.6 m), weight 180 lb 8 oz (81.9 kg), last menstrual period 07/24/2016.  Physical Exam: Cuff healing well normal no masses  Diagnostic  Tests:   Pathology: benign  Impression: s/p TVH RSO 09/09/2016  Plan:   Follow up: 1  years  Florian Buff, MD

## 2016-11-04 ENCOUNTER — Ambulatory Visit (INDEPENDENT_AMBULATORY_CARE_PROVIDER_SITE_OTHER): Payer: BLUE CROSS/BLUE SHIELD | Admitting: Orthopaedic Surgery

## 2016-11-04 ENCOUNTER — Encounter: Payer: Self-pay | Admitting: Orthopaedic Surgery

## 2016-11-04 VITALS — BP 132/87 | HR 87 | Temp 97.9°F | Ht 63.0 in | Wt 178.0 lb

## 2016-11-04 DIAGNOSIS — F17210 Nicotine dependence, cigarettes, uncomplicated: Secondary | ICD-10-CM | POA: Diagnosis not present

## 2016-11-04 DIAGNOSIS — G8929 Other chronic pain: Secondary | ICD-10-CM

## 2016-11-04 DIAGNOSIS — M545 Low back pain: Secondary | ICD-10-CM

## 2016-11-04 MED ORDER — HYDROCODONE-ACETAMINOPHEN 5-325 MG PO TABS: 1.0000 | ORAL_TABLET | Freq: Four times a day (QID) | ORAL | 0 refills | Status: DC | PRN

## 2016-11-04 NOTE — Progress Notes (Signed)
Patient Vanessa Bowen, female DOB:May 17, 1976, 41 y.o. YHC:[id number]  Chief Complaint  Patient presents with  . Follow-up    Chronic low back pain    HPI  Vanessa Bowen is a 41 y.o. female who has chronic lower back pain.  She is stable. She has no new acute problem.  She has no sciatica.  She is active and taking her medicine and doing her exercises.  I have recommended swimming as the weather warms up. HPI  Body mass index is 31.53 kg/m.  ROS  Review of Systems  HENT: Negative for congestion.   Respiratory: Negative for cough and shortness of breath.   Cardiovascular: Negative for chest pain and leg swelling.  Endocrine: Positive for cold intolerance.  Musculoskeletal: Positive for arthralgias and back pain.  Allergic/Immunologic: Positive for environmental allergies.    Past Medical History:  Diagnosis Date  . Anxiety   . Arthritis   . Chronic back pain   . Colitis, ulcerative ([place])   . Depression   . Hypothyroidism   . Migraine     Past Surgical History:  Procedure Laterality Date  . ANKLE SURGERY    . breast augmentation    . CESAREAN SECTION    . KNEE SURGERY    . LAPAROSCOPIC BILATERAL SALPINGECTOMY  07/27/2012   Procedure: LAPAROSCOPIC BILATERAL SALPINGECTOMY;  Surgeon: Florian Buff, MD;  Location: [facility];  Service: Gynecology;  Laterality: N/A;  . OOPHORECTOMY Right 09/09/2016   Procedure: RIGHT OOPHORECTOMY;  Surgeon: Florian Buff, MD;  Location: [facility];  Service: Gynecology;  Laterality: Right;  . OVARIAN CYST REMOVAL    . VAGINAL HYSTERECTOMY N/A 09/09/2016   Procedure: HYSTERECTOMY VAGINAL;  Surgeon: Florian Buff, MD;  Location: [facility];  Service: Gynecology;  Laterality: N/A;    Family History  Problem Relation Age of Onset  . Cancer Maternal Grandmother   . Cancer Maternal Grandfather   . Hypothyroidism Daughter     Social History Social History  Substance Use Topics  . Smoking status: Current Every Day Smoker    Packs/day: 0.50    Years:  15.00    Types: Cigarettes  . Smokeless tobacco: Never Used  . Alcohol use Yes     Comment: occasionally    Allergies  Allergen Reactions  . Doxycycline Nausea And Vomiting  . Sulfonamide Derivatives Nausea And Vomiting    Current Outpatient Prescriptions  Medication Sig Dispense Refill  . amitriptyline (ELAVIL) 150 MG tablet Take 150 mg by mouth at bedtime.    . clonazePAM (KLONOPIN) 1 MG tablet Take 0.5 mg by mouth 2 (two) times daily as needed. For anxiety/nerves    . cloNIDine (CATAPRES) 0.1 MG tablet Take 0.1 mg by mouth 2 (two) times daily.    Marland Kitchen HYDROcodone-acetaminophen (NORCO/VICODIN) 5-325 MG tablet Take 1 tablet by mouth every 6 (six) hours as needed for moderate pain (Must last 30 days.Do not take and drive a car or use machinery.). 105 tablet 0  . ibuprofen (ADVIL,MOTRIN) 200 MG tablet Take 400-600 mg by mouth every 8 (eight) hours as needed (for pain/headaches.).    Marland Kitchen levothyroxine (SYNTHROID, LEVOTHROID) 137 MCG tablet Take 1 tablet (137 mcg total) by mouth daily before breakfast. 30 tablet 5  . Melatonin 5 MG TABS Take 5 mg by mouth at bedtime as needed (for sleep.).    Marland Kitchen SSD 1 % cream Apply 1 application topically 2 (two) times daily.    Marland Kitchen tiZANidine (ZANAFLEX) 4 MG tablet Take 4-8 mg by mouth See admin  instructions. 4 mg daily as needed for muscle spasms, tension, or mild anxiety & 4-8 mg at bedtime as needed for sleep/anxiety/spasms.    Marland Kitchen topiramate (TOPAMAX) 25 MG tablet Take 25 mg by mouth 2 (two) times daily.    Marland Kitchen topiramate (TOPAMAX) 50 MG tablet Take 50 mg by mouth daily.     No current facility-administered medications for this visit.      Physical Exam  Blood pressure 132/87, pulse 87, temperature 97.9 F (36.6 C), height 5' 3"  (1.6 m), weight 178 lb (80.7 kg), last menstrual period 07/24/2016.  Constitutional: overall normal hygiene, normal nutrition, well developed, normal grooming, normal body habitus. Assistive device:none  Musculoskeletal: gait  and station Limp none, muscle tone and strength are normal, no tremors or atrophy is present.  .  Neurological: coordination overall normal.  Deep tendon reflex/nerve stretch intact.  Sensation normal.  Cranial nerves II-XII intact.   Skin:   Normal overall no scars, lesions, ulcers or rashes. No psoriasis.  Psychiatric: Alert and oriented x 3.  Recent memory intact, remote memory unclear.  Normal mood and affect. Well groomed.  Good eye contact.  Cardiovascular: overall no swelling, no varicosities, no edema bilaterally, normal temperatures of the legs and arms, no clubbing, cyanosis and good capillary refill.  Lymphatic: palpation is normal.  Spine/Pelvis examination:  Inspection:  Overall, sacoiliac joint benign and hips nontender; without crepitus or defects.   Thoracic spine inspection: Alignment normal without kyphosis present   Lumbar spine inspection:  Alignment  with normal lumbar lordosis, without scoliosis apparent.   Thoracic spine palpation:  without tenderness of spinal processes   Lumbar spine palpation: with tenderness of lumbar area; without tightness of lumbar muscles    Range of Motion:   Lumbar flexion, forward flexion is 45 without pain or tenderness    Lumbar extension is 10 without pain or tenderness   Left lateral bend is Normal  without pain or tenderness   Right lateral bend is Normal without pain or tenderness   Straight leg raising is Normal   Strength & tone: Normal   Stability overall normal stability     The patient has been educated about the nature of the problem(s) and counseled on treatment options.  The patient appeared to understand what I have discussed and is in agreement with it.  Encounter Diagnoses  Name Primary?  . Chronic midline low back pain without sciatica Yes  . Cigarette nicotine dependence without complication     PLAN Call if any problems.  Precautions discussed.  Continue current medications.   Return to clinic 3  months   I have reviewed the [place] web site prior to prescribing narcotic medicine for this patient.  Electronically Signed Sanjuana Kava, MD 5/16/20182:47 PM

## 2016-12-02 ENCOUNTER — Telehealth: Payer: Self-pay | Admitting: Orthopaedic Surgery

## 2016-12-03 MED ORDER — HYDROCODONE-ACETAMINOPHEN 5-325 MG PO TABS: 1.0000 | ORAL_TABLET | Freq: Four times a day (QID) | ORAL | 0 refills | Status: DC | PRN

## 2016-12-07 ENCOUNTER — Other Ambulatory Visit: Payer: Self-pay | Admitting: Obstetrics & Gynecology

## 2016-12-23 ENCOUNTER — Other Ambulatory Visit: Payer: Self-pay | Admitting: "Endocrinology

## 2016-12-30 ENCOUNTER — Telehealth: Payer: Self-pay | Admitting: Orthopaedic Surgery

## 2016-12-30 MED ORDER — HYDROCODONE-ACETAMINOPHEN 5-325 MG PO TABS: 1.0000 | ORAL_TABLET | Freq: Four times a day (QID) | ORAL | 0 refills | Status: DC | PRN

## 2017-01-21 ENCOUNTER — Emergency Department (HOSPITAL_COMMUNITY)
Admission: EM | Admit: 2017-01-21 | Discharge: 2017-01-21 | Disposition: A | Discharge status: Home / Self Care | Payer: Medicaid Other | Attending: Emergency Medicine | Admitting: Emergency Medicine

## 2017-01-21 ENCOUNTER — Emergency Department (HOSPITAL_COMMUNITY): Payer: Medicaid Other

## 2017-01-21 DIAGNOSIS — R0789 Other chest pain: Secondary | ICD-10-CM | POA: Diagnosis not present

## 2017-01-21 DIAGNOSIS — R0602 Shortness of breath: Secondary | ICD-10-CM | POA: Diagnosis not present

## 2017-01-21 DIAGNOSIS — E039 Hypothyroidism, unspecified: Secondary | ICD-10-CM | POA: Insufficient documentation

## 2017-01-21 DIAGNOSIS — Z79899 Other long term (current) drug therapy: Secondary | ICD-10-CM | POA: Diagnosis not present

## 2017-01-21 DIAGNOSIS — R079 Chest pain, unspecified: Secondary | ICD-10-CM | POA: Diagnosis present

## 2017-01-21 LAB — TROPONIN I: Troponin I: <0.03 ng/mL (ref <0.03)

## 2017-01-21 LAB — D-DIMER, QUANTITATIVE: D-Dimer, Quant: 0.32 ug/mL-FEU (ref 0.00–0.50)

## 2017-01-21 LAB — CBC WITH DIFFERENTIAL/PLATELET
Basophils Absolute: 0.1 10*3/uL (ref 0.0–0.1)
Basophils Relative: 1 %
EOS PCT: 1 %
Eosinophils Absolute: 0.1 10*3/uL (ref 0.0–0.7)
HEMATOCRIT: 39.8 % (ref 36.0–46.0)
HEMOGLOBIN: 13.6 g/dL (ref 12.0–15.0)
Lymphocytes Relative: 46 %
Lymphs Abs: 6.4 10*3/uL — ABNORMAL HIGH (ref 0.7–4.0)
MCH: 31.3 pg (ref 26.0–34.0)
MCHC: 34.2 g/dL (ref 30.0–36.0)
MCV: 91.5 fL (ref 78.0–100.0)
MONOS PCT: 4 %
Monocytes Absolute: 0.6 10*3/uL (ref 0.1–1.0)
NEUTROS PCT: 48 %
Neutro Abs: 6.8 10*3/uL (ref 1.7–7.7)
Platelets: 352 10*3/uL (ref 150–400)
RBC: 4.35 MIL/uL (ref 3.87–5.11)
RDW: 15.6 % — ABNORMAL HIGH (ref 11.5–15.5)
WBC: 14 10*3/uL — ABNORMAL HIGH (ref 4.0–10.5)

## 2017-01-21 LAB — BASIC METABOLIC PANEL
ANION GAP: 11 (ref 5–15)
BUN: 16 mg/dL (ref 6–20)
CHLORIDE: 102 mmol/L (ref 101–111)
CO2: 27 mmol/L (ref 22–32)
Calcium: 10.9 mg/dL — ABNORMAL HIGH (ref 8.9–10.3)
Creatinine, Ser: 1.19 mg/dL — ABNORMAL HIGH (ref 0.44–1.00)
GFR calc non Af Amer: 55 mL/min — ABNORMAL LOW (ref >60)
Glucose, Bld: 86 mg/dL (ref 65–99)
Potassium: 3.3 mmol/L — ABNORMAL LOW (ref 3.5–5.1)
Sodium: 140 mmol/L (ref 135–145)

## 2017-01-21 MED ORDER — POTASSIUM CHLORIDE CRYS ER 20 MEQ PO TBCR
40.0000 meq | EXTENDED_RELEASE_TABLET | Freq: Once | ORAL | Status: AC
  Administered 2017-01-21: 40 meq via ORAL
  Filled 2017-01-21: qty 2

## 2017-01-21 MED ORDER — ACETAMINOPHEN 500 MG PO TABS
1000.0000 mg | ORAL_TABLET | Freq: Once | ORAL | Status: AC
  Administered 2017-01-21: 1000 mg via ORAL
  Filled 2017-01-21: qty 2

## 2017-01-21 MED ORDER — IBUPROFEN 400 MG PO TABS: 400.0000 mg | ORAL_TABLET | Freq: Once | ORAL | Status: DC

## 2017-01-21 NOTE — ED Provider Notes (Addendum)
[name] Provider Note   CSN: [id number] Arrival date & time: 01/21/17  1808     History   Chief Complaint Chief Complaint  Patient presents with  . Chest Pain    HPI Vanessa Bowen is a 41 y.o. female.  HPI comPlains of anterior chest pain described as tightness episodic over the past month. This episode has been since 1 AM today, constant. Not made worse with exertion. She thinks that she may be having a panic attack. She's been suffering panic attacks for many years. Associated symptoms include shortness of breath. No nausea or sweatiness. She treated herself with Klonopin this morning, without relief, And ibuprofen prior to coming here without relief She's been seen by her primary care physician for same complaint and has recently been referred to [facility] in [facility] however has not been called back for an appointment.  Past Medical History:  Diagnosis Date  . Anxiety   . Arthritis   . Chronic back pain   . Colitis, ulcerative ([place])   . Depression   . Hypothyroidism   . Migraine     Patient Active Problem List   Diagnosis Date Noted  . S/P vaginal hysterectomy 09/09/2016  . Colitis, ulcerative ([place]) 03/31/2016  . Other specified hypothyroidism 03/18/2016  . WEIGHT LOSS, RECENT 09/23/2009  . NAUSEA WITH VOMITING 09/23/2009  . DIARRHEA, ACUTE 09/23/2009  . EPIGASTRIC PAIN 09/23/2009  . PUD, HX OF 09/23/2009  . ULCERATIVE COLITIS, HX OF 09/23/2009    Past Surgical History:  Procedure Laterality Date  . ANKLE SURGERY    . breast augmentation    . CESAREAN SECTION    . KNEE SURGERY    . LAPAROSCOPIC BILATERAL SALPINGECTOMY  07/27/2012   Procedure: LAPAROSCOPIC BILATERAL SALPINGECTOMY;  Surgeon: Florian Buff, MD;  Location: [facility];  Service: Gynecology;  Laterality: N/A;  . OOPHORECTOMY Right 09/09/2016   Procedure: RIGHT OOPHORECTOMY;  Surgeon: Florian Buff, MD;  Location: [facility];  Service: Gynecology;  Laterality: Right;  .  OVARIAN CYST REMOVAL    . VAGINAL HYSTERECTOMY N/A 09/09/2016   Procedure: HYSTERECTOMY VAGINAL;  Surgeon: Florian Buff, MD;  Location: [facility];  Service: Gynecology;  Laterality: N/A;    OB History    Gravida Para Term Preterm AB Living   3 2 2   1 2    SAB TAB Ectopic Multiple Live Births   1       2       Home Medications    Prior to Admission medications   Medication Sig Start Date End Date Taking? Authorizing Provider  amitriptyline (ELAVIL) 150 MG tablet Take 150 mg by mouth at bedtime. 08/27/16   [provider]  clonazePAM (KLONOPIN) 1 MG tablet Take 0.5 mg by mouth 2 (two) times daily as needed. For anxiety/nerves 08/18/16   [provider]  cloNIDine (CATAPRES) 0.1 MG tablet Take 0.1 mg by mouth 2 (two) times daily.    [provider]  HYDROcodone-acetaminophen (NORCO/VICODIN) 5-325 MG tablet Take 1 tablet by mouth every 6 (six) hours as needed for moderate pain (Must last 30 days.Do not take and drive a car or use machinery.). 12/30/16   Sanjuana Kava, MD  ibuprofen (ADVIL,MOTRIN) 200 MG tablet Take 400-600 mg by mouth every 8 (eight) hours as needed (for pain/headaches.).    [provider]  levothyroxine (SYNTHROID, LEVOTHROID) 137 MCG tablet Take 1 tablet (137 mcg total) by mouth daily before breakfast. 05/05/16   Loni Beckwith  W, MD  Melatonin 5 MG TABS Take 5 mg by mouth at bedtime as needed (for sleep.).    [provider]  promethazine (PHENERGAN) 25 MG tablet TAKE 1 TABLET BY MOUTH EVERY 6 HOURS AS NEEDED FOR NAUSEA 12/07/16   Florian Buff, MD  SSD 1 % cream Apply 1 application topically 2 (two) times daily. 08/27/16   [provider]  tiZANidine (ZANAFLEX) 4 MG tablet Take 4-8 mg by mouth See admin instructions. 4 mg daily as needed for muscle spasms, tension, or mild anxiety & 4-8 mg at bedtime as needed for sleep/anxiety/spasms. 08/18/16   [provider]  topiramate (TOPAMAX) 25 MG tablet Take 25 mg by  mouth 2 (two) times daily.    [provider]  topiramate (TOPAMAX) 50 MG tablet Take 50 mg by mouth daily. 07/25/16   [provider]    Family History Family History  Problem Relation Age of Onset  . Cancer Maternal Grandmother   . Cancer Maternal Grandfather   . Hypothyroidism Daughter   Negative for cardiac disease  Social History Social History  Substance Use Topics  . Smoking status: Current Every Day Smoker    Packs/day: 0.50    Years: 15.00    Types: Cigarettes  . Smokeless tobacco: Never Used  . Alcohol use Yes     Comment: occasionally  Denies illicit drug use   Allergies   Doxycycline and Sulfonamide derivatives   Review of Systems Review of Systems  Constitutional: Negative.   HENT: Negative.   Respiratory: Positive for shortness of breath.   Cardiovascular: Positive for chest pain.  Gastrointestinal: Negative.   Musculoskeletal: Negative.   Skin: Negative.   Neurological: Negative.   Psychiatric/Behavioral: The patient is nervous/anxious.   All other systems reviewed and are negative.    Physical Exam Updated Vital Signs BP 128/80 (BP Location: Right Arm)   Pulse 80   Temp 98 F (36.7 C) (Oral)   Resp 20   Ht 5' 3"  (1.6 m)   Wt 78.5 kg (173 lb)   LMP 07/24/2016   SpO2 99%   BMI 30.65 kg/m   Physical Exam  Constitutional: She appears well-developed and well-nourished.  HENT:  Head: Normocephalic and atraumatic.  Eyes: Pupils are equal, round, and reactive to light. Conjunctivae are normal.  Neck: Neck supple. No tracheal deviation present. No thyromegaly present.  Cardiovascular: Normal rate and regular rhythm.   No murmur heard. Pulmonary/Chest: Effort normal and breath sounds normal.  Abdominal: Soft. Bowel sounds are normal. She exhibits no distension. There is no tenderness.  Musculoskeletal: Normal range of motion. She exhibits no edema or tenderness.  Neurological: She is alert. Coordination normal.  Skin: Skin is  warm and dry. No rash noted.  Psychiatric:  Mildly anxious appearing  Nursing note and vitals reviewed.    ED Treatments / Results  Labs (all labs ordered are listed, but only abnormal results are displayed) Labs Reviewed - No data to display  EKG  EKG Interpretation  Date/Time:  Thursday January 21 2017 18:20:04 EDT Ventricular Rate:  79 PR Interval:    QRS Duration: 103 QT Interval:  390 QTC Calculation: 448 R Axis:   98 Text Interpretation:  Sinus rhythm Borderline right axis deviation Borderline repolarization abnormality No significant change since last tracing Confirmed by Orlie Dakin [phone]) on 01/21/2017 6:35:58 PM       Radiology No results found.  Procedures Procedures (including critical care time)  Medications Ordered in ED Medications - No  data to display   Initial Impression / Assessment and Plan / ED Course  I have reviewed the triage vital signs and the nursing notes.  Pertinent labs & imaging results that were available during my care of the patient were reviewed by me and considered in my medical decision making (see chart for details).    Results for orders placed or performed during the hospital encounter of 31/12/16  Basic metabolic panel  Result Value Ref Range   Sodium 140 135 - 145 mmol/L   Potassium 3.3 (L) 3.5 - 5.1 mmol/L   Chloride 102 101 - 111 mmol/L   CO2 27 22 - 32 mmol/L   Glucose, Bld 86 65 - 99 mg/dL   BUN 16 6 - 20 mg/dL   Creatinine, Ser 1.19 (H) 0.44 - 1.00 mg/dL   Calcium 10.9 (H) 8.9 - 10.3 mg/dL   GFR calc non Af Amer 55 (L) >60 mL/min   GFR calc Af Amer >60 >60 mL/min   Anion gap 11 5 - 15  CBC with Differential/Platelet  Result Value Ref Range   WBC 14.0 (H) 4.0 - 10.5 K/uL   RBC 4.35 3.87 - 5.11 MIL/uL   Hemoglobin 13.6 12.0 - 15.0 g/dL   HCT 39.8 36.0 - 46.0 %   MCV 91.5 78.0 - 100.0 fL   MCH 31.3 26.0 - 34.0 pg   MCHC 34.2 30.0 - 36.0 g/dL   RDW 15.6 (H) 11.5 - 15.5 %   Platelets 352 150 - 400 K/uL    Neutrophils Relative % 48 %   Lymphocytes Relative 46 %   Monocytes Relative 4 %   Eosinophils Relative 1 %   Basophils Relative 1 %   Neutro Abs 6.8 1.7 - 7.7 K/uL   Lymphs Abs 6.4 (H) 0.7 - 4.0 K/uL   Monocytes Absolute 0.6 0.1 - 1.0 K/uL   Eosinophils Absolute 0.1 0.0 - 0.7 K/uL   Basophils Absolute 0.1 0.0 - 0.1 K/uL   WBC Morphology WHITE COUNT CONFIRMED ON SMEAR   Troponin I  Result Value Ref Range   Troponin I <0.03 <0.03 ng/mL  D-dimer, quantitative (not at [facility])  Result Value Ref Range   D-Dimer, Quant 0.32 0.00 - 0.50 ug/mL-FEU   Dg Chest 2 View  Result Date: 01/21/2017 CLINICAL DATA:  Left chest pain and shortness of breath. EXAM: CHEST  2 VIEW COMPARISON:  02/06/2016. FINDINGS: Normal sized heart. Stable mild linear scarring at the left lung base. Otherwise, clear lungs with normal vascularity. Unremarkable bones. IMPRESSION: No acute abnormality. Electronically Signed   By: Claudie Revering M.D.   On: 01/21/2017 20:19   Heart score equals 2. Low pretest clinical probability for pulmonary embolism. Negative d-dimer. Chest x-ray viewed by me.  I suspect the patient has large component of anxiety. She'll be dosed with Tylenol prior to discharge. Also oral potassium supplementation prior to discharge. Counseled patient for 5 minutes on smoking cessation. She'll be first and number of [place] [facility], even office which she had been recently referred to by her PCP Final Clinical Impressions(s) / ED Diagnoses  dx #1 atypical chest pain #2 hypokalemia #3 anxiety #4 tobacco abuse Final diagnoses:  None    New Prescriptions New Prescriptions   No medications on file     Orlie Dakin, MD 01/21/17 2031    Orlie Dakin, MD 01/22/17 [phone]

## 2017-01-21 NOTE — Discharge Instructions (Signed)
Tylenol as directed for pain. If you don't hear from the cardiologist office tomorrow for an appointment call to schedule appointment. Tell them you were referred to your primary care physician and that you came to the emergency department tonight. Ask your primary care physician to help you to stop smoking.

## 2017-01-21 NOTE — ED Triage Notes (Signed)
Intermittent chest pain for months, also has anxiety. States she has been referred to a cardiologist

## 2017-01-21 NOTE — ED Notes (Signed)
Pt states that she started to feel at work and drove home and tried to eat supper.  She took her blood pressure 5 times at home and got 5 different readings.  Pt states that she took two 2 mg clonopin today.  Pt states that she feels like something is stuck in her throat and chest pain on the left side.

## 2017-02-01 ENCOUNTER — Emergency Department (HOSPITAL_COMMUNITY)
Admission: EM | Admit: 2017-02-01 | Discharge: 2017-02-01 | Disposition: A | Discharge status: Home / Self Care | Payer: Medicaid Other | Attending: Emergency Medicine | Admitting: Emergency Medicine

## 2017-02-01 ENCOUNTER — Encounter (HOSPITAL_COMMUNITY): Payer: Self-pay

## 2017-02-01 ENCOUNTER — Telehealth: Payer: Self-pay | Admitting: Orthopaedic Surgery

## 2017-02-01 ENCOUNTER — Emergency Department (HOSPITAL_COMMUNITY): Payer: Medicaid Other

## 2017-02-01 DIAGNOSIS — R1084 Generalized abdominal pain: Secondary | ICD-10-CM | POA: Diagnosis not present

## 2017-02-01 DIAGNOSIS — I10 Essential (primary) hypertension: Secondary | ICD-10-CM

## 2017-02-01 DIAGNOSIS — F17210 Nicotine dependence, cigarettes, uncomplicated: Secondary | ICD-10-CM | POA: Diagnosis not present

## 2017-02-01 DIAGNOSIS — Z79899 Other long term (current) drug therapy: Secondary | ICD-10-CM | POA: Insufficient documentation

## 2017-02-01 DIAGNOSIS — E039 Hypothyroidism, unspecified: Secondary | ICD-10-CM | POA: Diagnosis not present

## 2017-02-01 DIAGNOSIS — G43001 Migraine without aura, not intractable, with status migrainosus: Secondary | ICD-10-CM | POA: Diagnosis not present

## 2017-02-01 DIAGNOSIS — G43011 Migraine without aura, intractable, with status migrainosus: Secondary | ICD-10-CM

## 2017-02-01 LAB — COMPREHENSIVE METABOLIC PANEL
ALT: 20 U/L (ref 14–54)
ANION GAP: 11 (ref 5–15)
AST: 21 U/L (ref 15–41)
Albumin: 4.6 g/dL (ref 3.5–5.0)
Alkaline Phosphatase: 62 U/L (ref 38–126)
BUN: 19 mg/dL (ref 6–20)
CHLORIDE: 100 mmol/L — AB (ref 101–111)
CO2: 29 mmol/L (ref 22–32)
Calcium: 10 mg/dL (ref 8.9–10.3)
Creatinine, Ser: 1 mg/dL (ref 0.44–1.00)
GFR calc non Af Amer: >60 mL/min (ref >60)
Glucose, Bld: 107 mg/dL — ABNORMAL HIGH (ref 65–99)
POTASSIUM: 3.6 mmol/L (ref 3.5–5.1)
SODIUM: 140 mmol/L (ref 135–145)
Total Bilirubin: 0.4 mg/dL (ref 0.3–1.2)
Total Protein: 7.8 g/dL (ref 6.5–8.1)

## 2017-02-01 LAB — CBC WITH DIFFERENTIAL/PLATELET
Basophils Absolute: 0.1 10*3/uL (ref 0.0–0.1)
Basophils Relative: 1 %
EOS ABS: 0.2 10*3/uL (ref 0.0–0.7)
EOS PCT: 2 %
HCT: 40.6 % (ref 36.0–46.0)
Hemoglobin: 14 g/dL (ref 12.0–15.0)
LYMPHS ABS: 5 10*3/uL — AB (ref 0.7–4.0)
Lymphocytes Relative: 39 %
MCH: 32 pg (ref 26.0–34.0)
MCHC: 34.5 g/dL (ref 30.0–36.0)
MCV: 92.9 fL (ref 78.0–100.0)
Monocytes Absolute: 0.6 10*3/uL (ref 0.1–1.0)
Monocytes Relative: 5 %
Neutro Abs: 6.8 10*3/uL (ref 1.7–7.7)
Neutrophils Relative %: 53 %
PLATELETS: 368 10*3/uL (ref 150–400)
RBC: 4.37 MIL/uL (ref 3.87–5.11)
RDW: 15.1 % (ref 11.5–15.5)
WBC: 12.8 10*3/uL — AB (ref 4.0–10.5)

## 2017-02-01 LAB — LIPASE, BLOOD: Lipase: 29 U/L (ref 11–51)

## 2017-02-01 MED ORDER — IOPAMIDOL (ISOVUE-300) INJECTION 61%
INTRAVENOUS | Status: AC
  Filled 2017-02-01: qty 30

## 2017-02-01 MED ORDER — IOPAMIDOL (ISOVUE-300) INJECTION 61%
100.0000 mL | Freq: Once | INTRAVENOUS | Status: AC | PRN
  Administered 2017-02-01: 100 mL via INTRAVENOUS

## 2017-02-01 MED ORDER — SODIUM CHLORIDE 0.9 % IV BOLUS (SEPSIS)
1000.0000 mL | Freq: Once | INTRAVENOUS | Status: AC
  Administered 2017-02-01: 1000 mL via INTRAVENOUS

## 2017-02-01 MED ORDER — PROMETHAZINE HCL 25 MG/ML IJ SOLN
12.5000 mg | Freq: Once | INTRAMUSCULAR | Status: AC
  Administered 2017-02-01: 12.5 mg via INTRAVENOUS
  Filled 2017-02-01: qty 1

## 2017-02-01 MED ORDER — PROMETHAZINE HCL 25 MG PO TABS
25.0000 mg | ORAL_TABLET | Freq: Four times a day (QID) | ORAL | 1 refills | Status: DC | PRN
Start: 2017-02-01 — End: 2017-05-20

## 2017-02-01 MED ORDER — DEXAMETHASONE SODIUM PHOSPHATE 4 MG/ML IJ SOLN
10.0000 mg | Freq: Once | INTRAMUSCULAR | Status: AC
  Administered 2017-02-01: 10 mg via INTRAVENOUS
  Filled 2017-02-01: qty 3

## 2017-02-01 MED ORDER — DIPHENHYDRAMINE HCL 50 MG/ML IJ SOLN
25.0000 mg | Freq: Once | INTRAMUSCULAR | Status: AC
  Administered 2017-02-01: 25 mg via INTRAVENOUS
  Filled 2017-02-01: qty 1

## 2017-02-01 MED ORDER — SODIUM CHLORIDE 0.9 % IV SOLN: INTRAVENOUS | Status: DC

## 2017-02-01 MED ORDER — KETOROLAC TROMETHAMINE 30 MG/ML IJ SOLN
30.0000 mg | Freq: Once | INTRAMUSCULAR | Status: AC
  Administered 2017-02-01: 30 mg via INTRAVENOUS
  Filled 2017-02-01: qty 1

## 2017-02-01 NOTE — Discharge Instructions (Signed)
Follow back up with [facility] clinic. For blood pressure reevaluation. No hypertension today. CT scan of abdomen without any acute findings. CT of head without acute findings. Suspect headache is related to your migraine component. Go home and rest dark room work note provided. Take Phenergan as needed.

## 2017-02-01 NOTE — ED Provider Notes (Signed)
[facility] DEPT Provider Note   CSN: [id number] Arrival date & time: 02/01/17  1640     History   Chief Complaint Chief Complaint  Patient presents with  . Hypertension    HPI Vanessa Bowen is a 41 y.o. female.  Patient sent in from [facility] with several complaints. Patient has a history of migraine she has a left-sided headache that radiates the top of her head. Which is little different than her normal migraines. She has concerns about high blood pressure. She's raise this now for a while. Also had complaint of generalized abdominal pain states she has a history of ulcerative colitis and IBS. Denies any fevers. There is nausea she has had spots in her eyes. Patient did take Cozaar from a coworker earlier today but at the clinic her blood pressure was elevated. Patient does not have formal diagnosis of hypertension. She does have follow-up with cardiology. Patient was seen August 2 with atypical chest pain negative workup in the emergency department.      Past Medical History:  Diagnosis Date  . Anxiety   . Arthritis   . Chronic back pain   . Colitis, ulcerative ([place])   . Depression   . Hypothyroidism   . Migraine     Patient Active Problem List   Diagnosis Date Noted  . S/P vaginal hysterectomy 09/09/2016  . Colitis, ulcerative ([place]) 03/31/2016  . Other specified hypothyroidism 03/18/2016  . WEIGHT LOSS, RECENT 09/23/2009  . NAUSEA WITH VOMITING 09/23/2009  . DIARRHEA, ACUTE 09/23/2009  . EPIGASTRIC PAIN 09/23/2009  . PUD, HX OF 09/23/2009  . ULCERATIVE COLITIS, HX OF 09/23/2009    Past Surgical History:  Procedure Laterality Date  . ANKLE SURGERY    . breast augmentation    . CESAREAN SECTION    . KNEE SURGERY    . LAPAROSCOPIC BILATERAL SALPINGECTOMY  07/27/2012   Procedure: LAPAROSCOPIC BILATERAL SALPINGECTOMY;  Surgeon: Florian Buff, MD;  Location: [facility];  Service: Gynecology;  Laterality: N/A;  . OOPHORECTOMY Right 09/09/2016   Procedure: RIGHT  OOPHORECTOMY;  Surgeon: Florian Buff, MD;  Location: [facility];  Service: Gynecology;  Laterality: Right;  . OVARIAN CYST REMOVAL    . VAGINAL HYSTERECTOMY N/A 09/09/2016   Procedure: HYSTERECTOMY VAGINAL;  Surgeon: Florian Buff, MD;  Location: [facility];  Service: Gynecology;  Laterality: N/A;    OB History    Gravida Vanessa Term Preterm AB Living   3 2 2   1 2    SAB TAB Ectopic Multiple Live Births   1       2       Home Medications    Prior to Admission medications   Medication Sig Start Date End Date Taking? Authorizing Provider  amitriptyline (ELAVIL) 100 MG tablet TAKE ONE TABLET BY MOUTH ONCE DAILY AT BEDTIME 01/12/17  Yes [provider]  clonazePAM (KLONOPIN) 2 MG tablet TAKE ONE-HALD TABLET BY MOUTH TWICE DAILY. *MAY TAKE ONE TABLET AT BEDTIME AS NEEDED FOR ANXIETY 01/10/17  Yes [provider]  HYDROcodone-acetaminophen (NORCO/VICODIN) 5-325 MG tablet Take 1 tablet by mouth every 6 (six) hours as needed for moderate pain (Must last 30 days.Do not take and drive a car or use machinery.). 12/30/16  Yes Sanjuana Kava, MD  ibuprofen (ADVIL,MOTRIN) 200 MG tablet Take 400-600 mg by mouth every 8 (eight) hours as needed (for pain/headaches.).   Yes [provider]  levothyroxine (SYNTHROID, LEVOTHROID) 137 MCG tablet Take 1 tablet (137 mcg total) by mouth daily  before breakfast. 05/05/16  Yes Nida, Marella Chimes, MD  Melatonin 5 MG TABS Take 5 mg by mouth at bedtime as needed (for sleep.).   Yes [provider]  SAPHRIS 5 MG SUBL 24 hr tablet Place 1 tablet under the tongue 2 (two) times daily. 01/12/17  Yes [provider]  tiZANidine (ZANAFLEX) 4 MG tablet Take 4-8 mg by mouth See admin instructions. 4 mg daily as needed for muscle spasms, tension, or mild anxiety & 4-8 mg at bedtime as needed for sleep/anxiety/spasms. 08/18/16  Yes [provider]  topiramate (TOPAMAX) 25 MG tablet Take 25 mg by mouth 2 (two) times daily.   Yes  [provider]  topiramate (TOPAMAX) 50 MG tablet Take 50 mg by mouth daily. 07/25/16  Yes [provider]  promethazine (PHENERGAN) 25 MG tablet Take 1 tablet (25 mg total) by mouth every 6 (six) hours as needed. 02/01/17   Fredia Sorrow, MD  SSD 1 % cream Apply 1 application topically daily as needed.  08/27/16   [provider]    Family History Family History  Problem Relation Age of Onset  . Cancer Maternal Grandmother   . Cancer Maternal Grandfather   . Hypothyroidism Daughter     Social History Social History  Substance Use Topics  . Smoking status: Current Every Day Smoker    Packs/day: 0.50    Years: 15.00    Types: Cigarettes  . Smokeless tobacco: Never Used  . Alcohol use Yes     Comment: occasionally     Allergies   Doxycycline and Sulfonamide derivatives   Review of Systems Review of Systems  Constitutional: Negative for fever.  HENT: Negative for congestion.   Eyes: Positive for visual disturbance. Negative for photophobia and redness.  Respiratory: Negative for shortness of breath.   Cardiovascular: Negative for chest pain.  Gastrointestinal: Positive for nausea. Negative for abdominal pain.  Genitourinary: Negative for dysuria.  Musculoskeletal: Negative for back pain.  Skin: Negative for rash.  Neurological: Positive for headaches.  Hematological: Does not bruise/bleed easily.  Psychiatric/Behavioral: Negative for confusion.     Physical Exam Updated Vital Signs BP 123/87   Pulse 88   Temp 98.3 F (36.8 C) (Oral)   Resp 17   Ht 1.6 m (5' 3" )   Wt 78.5 kg (173 lb)   LMP 07/24/2016   SpO2 95%   BMI 30.65 kg/m   Physical Exam  Constitutional: She is oriented to person, place, and time. She appears well-developed and well-nourished. No distress.  HENT:  Head: Normocephalic and atraumatic.  Mouth/Throat: Oropharynx is clear and moist.  Eyes: Pupils are equal, round, and reactive to light. EOM are normal.  Neck:  Normal range of motion. Neck supple.  Cardiovascular: Normal rate, regular rhythm and normal heart sounds.   Pulmonary/Chest: Effort normal and breath sounds normal.  Abdominal: Soft. Bowel sounds are normal. There is no tenderness.  Musculoskeletal: Normal range of motion.  Neurological: She is alert and oriented to person, place, and time. No cranial nerve deficit or sensory deficit. She exhibits normal muscle tone. Coordination normal.  Skin: Skin is warm.  Nursing note and vitals reviewed.    ED Treatments / Results  Labs (all labs ordered are listed, but only abnormal results are displayed) Labs Reviewed  CBC WITH DIFFERENTIAL/PLATELET - Abnormal; Notable for the following:       Result Value   WBC 12.8 (*)    Lymphs Abs 5.0 (*)    All other components  within normal limits  COMPREHENSIVE METABOLIC PANEL - Abnormal; Notable for the following:    Chloride 100 (*)    Glucose, Bld 107 (*)    All other components within normal limits  LIPASE, BLOOD    EKG  EKG Interpretation None       Radiology Ct Head Wo Contrast  Result Date: 02/01/2017 CLINICAL DATA:  Thunderclap headache EXAM: CT HEAD WITHOUT CONTRAST TECHNIQUE: Contiguous axial images were obtained from the base of the skull through the vertex without intravenous contrast. COMPARISON:  Head CT 03/18/2009 FINDINGS: Brain: No mass lesion, intraparenchymal hemorrhage or extra-axial collection. No evidence of acute cortical infarct. Brain parenchyma and CSF-containing spaces are normal for age. Vascular: No hyperdense vessel or unexpected calcification. Skull: Normal visualized skull base, calvarium and extracranial soft tissues. Sinuses/Orbits: No sinus fluid levels or advanced mucosal thickening. No mastoid effusion. Normal orbits. IMPRESSION: Normal head CT. Electronically Signed   By: Ulyses Jarred M.D.   On: 02/01/2017 19:34   Ct Abdomen Pelvis W Contrast  Result Date: 02/01/2017 CLINICAL DATA:  Abdominal pain for 1  week. No bowel movements for 2 weeks. History of ulcerative colitis. EXAM: CT ABDOMEN AND PELVIS WITH CONTRAST TECHNIQUE: Multidetector CT imaging of the abdomen and pelvis was performed using the standard protocol following bolus administration of intravenous contrast. CONTRAST:  132m ISOVUE-300 IOPAMIDOL (ISOVUE-300) INJECTION 61% COMPARISON:  10/10/2015 FINDINGS: Lower chest: Clear lung bases. Normal heart size without pericardial or pleural effusion. Bilateral breast implants. Hepatobiliary: Normal liver. Normal gallbladder, without biliary ductal dilatation. Pancreas: Normal, without mass or ductal dilatation. Spleen: Normal in size, without focal abnormality. Adrenals/Urinary Tract: Normal adrenal glands. Normal kidneys, without hydronephrosis. Normal urinary bladder. Stomach/Bowel: Normal stomach, without wall thickening. Colonic stool burden suggests constipation. Normal terminal ileum and appendix. Normal small bowel. Vascular/Lymphatic: Aortic and branch vessel atherosclerosis. Retroaortic left renal vein. No abdominopelvic adenopathy. Reproductive: Hysterectomy.  No adnexal mass. Other: No significant free fluid. Musculoskeletal: No acute osseous abnormality. IMPRESSION: 1.  Possible constipation. 2. No evidence of colitis or other explanation for patient's symptoms. 3.  Aortic Atherosclerosis (ICD10-I70.0).  This is age advanced. Electronically Signed   By: KAbigail MiyamotoM.D.   On: 02/01/2017 19:40    Procedures Procedures (including critical care time)  Medications Ordered in ED Medications  0.9 %  sodium chloride infusion (not administered)  iopamidol (ISOVUE-300) 61 % injection (not administered)  ketorolac (TORADOL) 30 MG/ML injection 30 mg (not administered)  sodium chloride 0.9 % bolus 1,000 mL (0 mLs Intravenous Stopped 02/01/17 2002)  dexamethasone (DECADRON) injection 10 mg (10 mg Intravenous Given 02/01/17 1748)  diphenhydrAMINE (BENADRYL) injection 25 mg (25 mg Intravenous Given  02/01/17 1749)  promethazine (PHENERGAN) injection 12.5 mg (12.5 mg Intravenous Given 02/01/17 1749)  iopamidol (ISOVUE-300) 61 % injection 100 mL (100 mLs Intravenous Contrast Given 02/01/17 1913)     Initial Impression / Assessment and Plan / ED Course  I have reviewed the triage vital signs and the nursing notes.  Pertinent labs & imaging results that were available during my care of the patient were reviewed by me and considered in my medical decision making (see chart for details).     Patient presenting with concerns for hypertension. Had similar concern when she was evaluated earlier with atypical chest pain. That was August 2. [facility] had several complaints one was headache which was somewhat similar to her migraines but not exactly like them. Apparently blood pressure was high there. Patient also with  abdominal pain states that she has a history of IBS and colitis. He was referred to the emergency apartment for further evaluation. Patient is currently not on hypertensive meds. Does not carry a formal diagnosis of hypertension. Patient does have a referral to cardiology.  Workup here included CT head CT abdomen without any acute findings. Patient was treated with migraine cocktail with some improvement in the headache. Not completely resolved. Labs without significant bnormality other than a mild leukocytosis. Electrolytes and liver function tests without significant findings.  No evidence of hypertension here tonight suspect that the headache is migraine related. CT of abdomen gave no expiration with abdominal pain. Other than some mild constipation.  Final Clinical Impressions(s) / ED Diagnoses   Final diagnoses:  Essential hypertension  Intractable migraine without aura and with status migrainosus  Generalized abdominal pain    New Prescriptions New Prescriptions   PROMETHAZINE (PHENERGAN) 25 MG TABLET    Take 1 tablet (25 mg total) by mouth  every 6 (six) hours as needed.     Fredia Sorrow, MD 02/01/17 2223

## 2017-02-01 NOTE — ED Triage Notes (Signed)
Reports of headache that started this morning. States her boss gave her Losartan today at 1230. Has apt with cardiologists regarding recent hypertension. Also reports of no BM in 2 weeks, abdominal pain.

## 2017-02-01 NOTE — ED Notes (Signed)
ED Provider at bedside. 

## 2017-02-02 MED ORDER — HYDROCODONE-ACETAMINOPHEN 5-325 MG PO TABS: 1.0000 | ORAL_TABLET | Freq: Four times a day (QID) | ORAL | 0 refills | Status: DC | PRN

## 2017-02-03 ENCOUNTER — Ambulatory Visit: Payer: BLUE CROSS/BLUE SHIELD | Admitting: Orthopaedic Surgery

## 2017-02-08 ENCOUNTER — Encounter: Payer: Self-pay | Admitting: Cardiology

## 2017-02-08 ENCOUNTER — Encounter: Payer: Self-pay | Admitting: *Deleted
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
## 2017-02-09 ENCOUNTER — Ambulatory Visit (INDEPENDENT_AMBULATORY_CARE_PROVIDER_SITE_OTHER): Payer: Medicaid Other | Admitting: Cardiology

## 2017-02-09 ENCOUNTER — Telehealth: Payer: Self-pay | Admitting: Cardiology

## 2017-02-09 ENCOUNTER — Encounter: Payer: Self-pay | Admitting: Cardiology

## 2017-02-09 VITALS — BP 115/78 | HR 90 | Ht 63.0 in | Wt 181.6 lb

## 2017-02-09 DIAGNOSIS — R030 Elevated blood-pressure reading, without diagnosis of hypertension: Secondary | ICD-10-CM

## 2017-02-09 DIAGNOSIS — E039 Hypothyroidism, unspecified: Secondary | ICD-10-CM | POA: Diagnosis not present

## 2017-02-09 DIAGNOSIS — R9431 Abnormal electrocardiogram [ECG] [EKG]: Secondary | ICD-10-CM | POA: Diagnosis not present

## 2017-02-09 DIAGNOSIS — R0609 Other forms of dyspnea: Secondary | ICD-10-CM

## 2017-02-09 DIAGNOSIS — R0600 Dyspnea, unspecified: Secondary | ICD-10-CM

## 2017-02-09 DIAGNOSIS — Z720 Tobacco use: Secondary | ICD-10-CM

## 2017-02-09 NOTE — Telephone Encounter (Signed)
Stress echo - dyspnea on exertion Scheduled at [facility] Feb 17, 2017 arrive at 8am

## 2017-02-09 NOTE — Patient Instructions (Signed)
Medication Instructions:  Your physician recommends that you continue on your current medications as directed. Please refer to the Current Medication list given to you today.  Labwork: none  Testing/Procedures: Your physician has requested that you have a stress echocardiogram. For further information please visit [email or web address]. Please follow instruction sheet as given.  Follow-Up: Your physician recommends that you schedule a follow-up appointment PENDING TEST RESULTS  Any Other Special Instructions Will Be Listed Below (If Applicable).  If you need a refill on your cardiac medications before your next appointment, please call your pharmacy.

## 2017-02-11 ENCOUNTER — Ambulatory Visit: Payer: Medicaid Other | Admitting: Orthopaedic Surgery

## 2017-02-17 ENCOUNTER — Other Ambulatory Visit (HOSPITAL_COMMUNITY): Payer: Medicaid Other

## 2017-02-17 ENCOUNTER — Telehealth: Payer: Self-pay

## 2017-02-17 ENCOUNTER — Other Ambulatory Visit: Payer: Self-pay | Admitting: Obstetrics & Gynecology

## 2017-02-17 ENCOUNTER — Ambulatory Visit (HOSPITAL_COMMUNITY)
Admission: RE | Admit: 2017-02-17 | Discharge: 2017-02-17 | Disposition: A | Discharge status: Home / Self Care | Payer: Medicaid Other | Source: Ambulatory Visit | Attending: Cardiology | Admitting: Cardiology

## 2017-02-17 DIAGNOSIS — R000 Tachycardia, unspecified: Secondary | ICD-10-CM | POA: Insufficient documentation

## 2017-02-17 DIAGNOSIS — E039 Hypothyroidism, unspecified: Secondary | ICD-10-CM | POA: Insufficient documentation

## 2017-02-17 DIAGNOSIS — Z720 Tobacco use: Secondary | ICD-10-CM | POA: Diagnosis not present

## 2017-02-17 DIAGNOSIS — I700 Atherosclerosis of aorta: Secondary | ICD-10-CM | POA: Diagnosis not present

## 2017-02-17 DIAGNOSIS — R1013 Epigastric pain: Secondary | ICD-10-CM | POA: Diagnosis not present

## 2017-02-17 DIAGNOSIS — R0609 Other forms of dyspnea: Secondary | ICD-10-CM

## 2017-02-17 DIAGNOSIS — R0600 Dyspnea, unspecified: Secondary | ICD-10-CM

## 2017-02-17 LAB — ECHOCARDIOGRAM STRESS TEST
CHL CUP MPHR: 178 {beats}/min
CHL CUP RESTING HR STRESS: 70 {beats}/min
CHL RATE OF PERCEIVED EXERTION: 16
CSEPPHR: 160 {beats}/min
Estimated workload: 9 METS
Exercise duration (min): 6 min
Exercise duration (sec): 53 s
Percent HR: 89 %

## 2017-02-17 NOTE — Telephone Encounter (Signed)
-----   Message from Satira Sark, MD sent at 02/17/2017  1:24 PM EDT ----- Results reviewed. Stress test was reassuring overall particularly based on the normal echocardiographic findings. This does not suggest high chance of obstructive CAD. No further cardiac testing is planned at this time. Should keep follow-up with PCP. A copy of this test should be forwarded to Sharilyn Sites, MD.

## 2017-02-17 NOTE — Telephone Encounter (Signed)
Patient notified. Routed to PCP 

## 2017-02-17 NOTE — Progress Notes (Signed)
*  PRELIMINARY RESULTS* Echocardiogram Echocardiogram Stress Test has been performed.  Vanessa Bowen 02/17/2017, 10:06 AM

## 2017-02-18 ENCOUNTER — Ambulatory Visit: Payer: Medicaid Other | Admitting: Orthopaedic Surgery

## 2017-03-24 ENCOUNTER — Ambulatory Visit (INDEPENDENT_AMBULATORY_CARE_PROVIDER_SITE_OTHER): Payer: Medicaid Other | Admitting: Orthopaedic Surgery

## 2017-03-24 ENCOUNTER — Encounter: Payer: Self-pay | Admitting: Orthopaedic Surgery

## 2017-03-24 VITALS — BP 120/83 | HR 88 | Ht 63.0 in | Wt 183.0 lb

## 2017-03-24 DIAGNOSIS — M545 Low back pain, unspecified: Secondary | ICD-10-CM

## 2017-03-24 DIAGNOSIS — F17210 Nicotine dependence, cigarettes, uncomplicated: Secondary | ICD-10-CM | POA: Diagnosis not present

## 2017-03-24 DIAGNOSIS — G8929 Other chronic pain: Secondary | ICD-10-CM | POA: Diagnosis not present

## 2017-03-24 MED ORDER — HYDROCODONE-ACETAMINOPHEN 5-325 MG PO TABS: 1.0000 | ORAL_TABLET | Freq: Four times a day (QID) | ORAL | 0 refills | Status: DC | PRN

## 2017-03-24 NOTE — Patient Instructions (Signed)
Steps to Quit Smoking Smoking tobacco can be bad for your health. It can also affect almost every organ in your body. Smoking puts you and people around you at risk for many serious Dickie Labarre-lasting (chronic) diseases. Quitting smoking is hard, but it is one of the best things that you can do for your health. It is never too late to quit. What are the benefits of quitting smoking? When you quit smoking, you lower your risk for getting serious diseases and conditions. They can include:  Lung cancer or lung disease.  Heart disease.  Stroke.  Heart attack.  Not being able to have children (infertility).  Weak bones (osteoporosis) and broken bones (fractures).  If you have coughing, wheezing, and shortness of breath, those symptoms may get better when you quit. You may also get sick less often. If you are pregnant, quitting smoking can help to lower your chances of having a baby of low birth weight. What can I do to help me quit smoking? Talk with your doctor about what can help you quit smoking. Some things you can do (strategies) include:  Quitting smoking totally, instead of slowly cutting back how much you smoke over a period of time.  Going to in-person counseling. You are more likely to quit if you go to many counseling sessions.  Using resources and support systems, such as: ? Online chats with a counselor. ? Phone quitlines. ? Printed self-help materials. ? Support groups or group counseling. ? Text messaging programs. ? Mobile phone apps or applications.  Taking medicines. Some of these medicines may have nicotine in them. If you are pregnant or breastfeeding, do not take any medicines to quit smoking unless your doctor says it is okay. Talk with your doctor about counseling or other things that can help you.  Talk with your doctor about using more than one strategy at the same time, such as taking medicines while you are also going to in-person counseling. This can help make  quitting easier. What things can I do to make it easier to quit? Quitting smoking might feel very hard at first, but there is a lot that you can do to make it easier. Take these steps:  Talk to your family and friends. Ask them to support and encourage you.  Call phone quitlines, reach out to support groups, or work with a counselor.  Ask people who smoke to not smoke around you.  Avoid places that make you want (trigger) to smoke, such as: ? Bars. ? Parties. ? Smoke-break areas at work.  Spend time with people who do not smoke.  Lower the stress in your life. Stress can make you want to smoke. Try these things to help your stress: ? Getting regular exercise. ? Deep-breathing exercises. ? Yoga. ? Meditating. ? Doing a body scan. To do this, close your eyes, focus on one area of your body at a time from head to toe, and notice which parts of your body are tense. Try to relax the muscles in those areas.  Download or buy apps on your mobile phone or tablet that can help you stick to your quit plan. There are many free apps, such as QuitGuide from the CDC (Centers for Disease Control and Prevention). You can find more support from smokefree.gov and other websites.  This information is not intended to replace advice given to you by your health care provider. Make sure you discuss any questions you have with your health care provider. Document Released: 04/04/2009 Document   Revised: 02/04/2016 Document Reviewed: 10/23/2014 Elsevier Interactive Patient Education  2018 Elsevier Inc.  

## 2017-03-24 NOTE — Progress Notes (Signed)
Patient [name], female DOB:05-26-1976, 41 y.o. TKW:[id number]  Chief Complaint  Patient presents with  . Follow-up    Low back pain    HPI  Vanessa Bowen is a 41 y.o. female who has chronic lower back pain that is stable.  She has no new trauma, no paresthesias.  She is doing her exercises and taking her medicine.  She continues to smoke and has problems cutting back.  HPI  Body mass index is 32.42 kg/m.  ROS  Review of Systems  HENT: Negative for congestion.   Respiratory: Negative for cough and shortness of breath.   Cardiovascular: Negative for chest pain and leg swelling.  Endocrine: Positive for cold intolerance.  Musculoskeletal: Positive for arthralgias and back pain.  Allergic/Immunologic: Positive for environmental allergies.    Past Medical History:  Diagnosis Date  . Anxiety   . Arthritis   . Chronic back pain   . Colitis, ulcerative ([place])   . Depression   . Elevated blood pressure, situational   . Hypothyroidism   . Migraine     Past Surgical History:  Procedure Laterality Date  . ANKLE SURGERY    . BREAST ENHANCEMENT SURGERY    . CESAREAN SECTION    . KNEE SURGERY    . LAPAROSCOPIC BILATERAL SALPINGECTOMY  07/27/2012   Procedure: LAPAROSCOPIC BILATERAL SALPINGECTOMY;  Surgeon: Florian Buff, MD;  Location: [facility];  Service: Gynecology;  Laterality: N/A;  . OOPHORECTOMY Right 09/09/2016   Procedure: RIGHT OOPHORECTOMY;  Surgeon: Florian Buff, MD;  Location: [facility];  Service: Gynecology;  Laterality: Right;  . OVARIAN CYST REMOVAL    . VAGINAL HYSTERECTOMY N/A 09/09/2016   Procedure: HYSTERECTOMY VAGINAL;  Surgeon: Florian Buff, MD;  Location: [facility];  Service: Gynecology;  Laterality: N/A;    Family History  Problem Relation Age of Onset  . Cancer Maternal Grandmother   . Cancer Maternal Grandfather   . Hypothyroidism Daughter     Social History Social History  Substance Use Topics  . Smoking status: Current Every Day Smoker   Packs/day: 0.50    Years: 15.00    Types: Cigarettes  . Smokeless tobacco: Never Used  . Alcohol use Yes     Comment: Occasionally    Allergies  Allergen Reactions  . Doxycycline Nausea And Vomiting  . Sulfonamide Derivatives Nausea And Vomiting    Current Outpatient Prescriptions  Medication Sig Dispense Refill  . amitriptyline (ELAVIL) 100 MG tablet TAKE ONE TABLET BY MOUTH ONCE DAILY AT BEDTIME  1  . clonazePAM (KLONOPIN) 2 MG tablet TAKE ONE-HALD TABLET BY MOUTH TWICE DAILY. *MAY TAKE ONE TABLET AT BEDTIME AS NEEDED FOR ANXIETY  1  . HYDROcodone-acetaminophen (NORCO/VICODIN) 5-325 MG tablet Take 1 tablet by mouth every 6 (six) hours as needed for moderate pain (Must last 30 days.Do not take and drive a car or use machinery.). 85 tablet 0  . ibuprofen (ADVIL,MOTRIN) 200 MG tablet Take 400-600 mg by mouth every 8 (eight) hours as needed (for pain/headaches.).    Marland Kitchen levothyroxine (SYNTHROID, LEVOTHROID) 137 MCG tablet Take 1 tablet (137 mcg total) by mouth daily before breakfast. 30 tablet 5  . Melatonin 5 MG TABS Take 5 mg by mouth at bedtime as needed (for sleep.).    Marland Kitchen metroNIDAZOLE (METROGEL) 0.75 % vaginal gel USE VAGINALLY EVERY NIGHT FOR 5 NIGHTS 70 g 0  . promethazine (PHENERGAN) 25 MG tablet Take 1 tablet (25 mg total) by mouth every 6 (six) hours as needed. 12 tablet 1  .  SAPHRIS 5 MG SUBL 24 hr tablet Place 1 tablet under the tongue 2 (two) times daily.  1  . SSD 1 % cream APPLY TO AFFECTED AREA(S) 2 TO 3 TIMES PER DAY 50 g 3  . tiZANidine (ZANAFLEX) 4 MG tablet Take 4-8 mg by mouth See admin instructions. 4 mg daily as needed for muscle spasms, tension, or mild anxiety & 4-8 mg at bedtime as needed for sleep/anxiety/spasms.    Marland Kitchen topiramate (TOPAMAX) 25 MG tablet Take 25 mg by mouth 2 (two) times daily.    Marland Kitchen topiramate (TOPAMAX) 50 MG tablet Take 50 mg by mouth daily.     No current facility-administered medications for this visit.      Physical Exam  Blood pressure  120/83, pulse 88, height 5' 3"  (1.6 m), weight 183 lb (83 kg), last menstrual period 07/24/2016.  Constitutional: overall normal hygiene, normal nutrition, well developed, normal grooming, normal body habitus. Assistive device:none  Musculoskeletal: gait and station Limp none, muscle tone and strength are normal, no tremors or atrophy is present.  .  Neurological: coordination overall normal.  Deep tendon reflex/nerve stretch intact.  Sensation normal.  Cranial nerves II-XII intact.   Skin:   Normal overall no scars, lesions, ulcers or rashes. No psoriasis.  Psychiatric: Alert and oriented x 3.  Recent memory intact, remote memory unclear.  Normal mood and affect. Well groomed.  Good eye contact.  Cardiovascular: overall no swelling, no varicosities, no edema bilaterally, normal temperatures of the legs and arms, no clubbing, cyanosis and good capillary refill.  Lymphatic: palpation is normal.  All other systems reviewed and are negative   Spine/Pelvis examination:  Inspection:  Overall, sacoiliac joint benign and hips nontender; without crepitus or defects.   Thoracic spine inspection: Alignment normal without kyphosis present   Lumbar spine inspection:  Alignment  with normal lumbar lordosis, without scoliosis apparent.   Thoracic spine palpation:  without tenderness of spinal processes   Lumbar spine palpation: with tenderness of lumbar area; without tightness of lumbar muscles    Range of Motion:   Lumbar flexion, forward flexion is 45 without pain or tenderness    Lumbar extension is 10 without pain or tenderness   Left lateral bend is Normal  without pain or tenderness   Right lateral bend is Normal without pain or tenderness   Straight leg raising is Normal   Strength & tone: Normal   Stability overall normal stability    The patient has been educated about the nature of the problem(s) and counseled on treatment options.  The patient appeared to understand what I have  discussed and is in agreement with it.  Encounter Diagnoses  Name Primary?  . Chronic midline low back pain without sciatica Yes  . Cigarette nicotine dependence without complication     PLAN Call if any problems.  Precautions discussed.  Continue current medications.   Return to clinic 3 months   I have reviewed the [place] web site prior to prescribing narcotic medicine for this patient.  Electronically Signed Sanjuana Kava, MD 10/3/20182:54 PM

## 2017-03-31 ENCOUNTER — Other Ambulatory Visit: Payer: Self-pay | Admitting: "Endocrinology

## 2017-03-31 DIAGNOSIS — E039 Hypothyroidism, unspecified: Secondary | ICD-10-CM

## 2017-03-31 DIAGNOSIS — R739 Hyperglycemia, unspecified: Secondary | ICD-10-CM

## 2017-04-25 ENCOUNTER — Other Ambulatory Visit: Payer: Self-pay | Admitting: Orthopaedic Surgery

## 2017-04-26 MED ORDER — HYDROCODONE-ACETAMINOPHEN 5-325 MG PO TABS: 1.0000 | ORAL_TABLET | Freq: Four times a day (QID) | ORAL | 0 refills | Status: DC | PRN

## 2017-04-27 ENCOUNTER — Ambulatory Visit: Payer: Self-pay | Admitting: "Endocrinology

## 2017-04-28 LAB — COMPREHENSIVE METABOLIC PANEL
AG Ratio: 1.8 (calc) (ref 1.0–2.5)
ALT: 21 U/L (ref 6–29)
AST: 17 U/L (ref 10–30)
Albumin: 4.8 g/dL (ref 3.6–5.1)
Alkaline phosphatase (APISO): 74 U/L (ref 33–115)
BUN: 18 mg/dL (ref 7–25)
CALCIUM: 10.1 mg/dL (ref 8.6–10.2)
CO2: 29 mmol/L (ref 20–32)
CREATININE: 1.05 mg/dL (ref 0.50–1.10)
Chloride: 102 mmol/L (ref 98–110)
GLUCOSE: 84 mg/dL (ref 65–99)
Globulin: 2.6 g/dL (calc) (ref 1.9–3.7)
Potassium: 4.5 mmol/L (ref 3.5–5.3)
SODIUM: 138 mmol/L (ref 135–146)
TOTAL PROTEIN: 7.4 g/dL (ref 6.1–8.1)
Total Bilirubin: 0.4 mg/dL (ref 0.2–1.2)

## 2017-04-28 LAB — HEMOGLOBIN A1C
HEMOGLOBIN A1C: 5.2 %{Hb} (ref <5.7)
MEAN PLASMA GLUCOSE: 103 (calc)
eAG (mmol/L): 5.7 (calc)

## 2017-04-28 LAB — T4, FREE: FREE T4: 0.5 ng/dL — AB (ref 0.8–1.8)

## 2017-04-28 LAB — TSH: TSH: 100.97 mIU/L — ABNORMAL HIGH

## 2017-05-06 ENCOUNTER — Emergency Department (HOSPITAL_COMMUNITY)
Admission: EM | Admit: 2017-05-06 | Discharge: 2017-05-06 | Disposition: A | Discharge status: Home / Self Care | Payer: Medicaid Other | Attending: Emergency Medicine | Admitting: Emergency Medicine

## 2017-05-06 ENCOUNTER — Encounter (HOSPITAL_COMMUNITY): Payer: Self-pay | Admitting: *Deleted

## 2017-05-06 ENCOUNTER — Emergency Department (HOSPITAL_COMMUNITY): Payer: Medicaid Other

## 2017-05-06 ENCOUNTER — Other Ambulatory Visit: Payer: Self-pay

## 2017-05-06 DIAGNOSIS — R110 Nausea: Secondary | ICD-10-CM | POA: Diagnosis not present

## 2017-05-06 DIAGNOSIS — Z79899 Other long term (current) drug therapy: Secondary | ICD-10-CM | POA: Diagnosis not present

## 2017-05-06 DIAGNOSIS — R1011 Right upper quadrant pain: Secondary | ICD-10-CM | POA: Insufficient documentation

## 2017-05-06 DIAGNOSIS — R1031 Right lower quadrant pain: Secondary | ICD-10-CM

## 2017-05-06 DIAGNOSIS — F17210 Nicotine dependence, cigarettes, uncomplicated: Secondary | ICD-10-CM | POA: Insufficient documentation

## 2017-05-06 DIAGNOSIS — E039 Hypothyroidism, unspecified: Secondary | ICD-10-CM | POA: Insufficient documentation

## 2017-05-06 LAB — COMPREHENSIVE METABOLIC PANEL
ALT: 22 U/L (ref 14–54)
ANION GAP: 10 (ref 5–15)
AST: 20 U/L (ref 15–41)
Albumin: 4.6 g/dL (ref 3.5–5.0)
Alkaline Phosphatase: 83 U/L (ref 38–126)
BILIRUBIN TOTAL: 0.4 mg/dL (ref 0.3–1.2)
BUN: 11 mg/dL (ref 6–20)
CO2: 25 mmol/L (ref 22–32)
Calcium: 9.6 mg/dL (ref 8.9–10.3)
Chloride: 100 mmol/L — ABNORMAL LOW (ref 101–111)
Creatinine, Ser: 1 mg/dL (ref 0.44–1.00)
GFR calc Af Amer: >60 mL/min (ref >60)
Glucose, Bld: 101 mg/dL — ABNORMAL HIGH (ref 65–99)
POTASSIUM: 3.9 mmol/L (ref 3.5–5.1)
Sodium: 135 mmol/L (ref 135–145)
TOTAL PROTEIN: 8 g/dL (ref 6.5–8.1)

## 2017-05-06 LAB — CBC
HEMATOCRIT: 43.6 % (ref 36.0–46.0)
HEMOGLOBIN: 14.6 g/dL (ref 12.0–15.0)
MCH: 31.7 pg (ref 26.0–34.0)
MCHC: 33.5 g/dL (ref 30.0–36.0)
MCV: 94.8 fL (ref 78.0–100.0)
Platelets: 427 10*3/uL — ABNORMAL HIGH (ref 150–400)
RBC: 4.6 MIL/uL (ref 3.87–5.11)
RDW: 14.1 % (ref 11.5–15.5)
WBC: 9.9 10*3/uL (ref 4.0–10.5)

## 2017-05-06 LAB — URINALYSIS, ROUTINE W REFLEX MICROSCOPIC
Bilirubin Urine: NEGATIVE
Glucose, UA: NEGATIVE mg/dL
Hgb urine dipstick: NEGATIVE
KETONES UR: NEGATIVE mg/dL
LEUKOCYTES UA: NEGATIVE
NITRITE: NEGATIVE
PH: 6 (ref 5.0–8.0)
Protein, ur: NEGATIVE mg/dL
SPECIFIC GRAVITY, URINE: 1.015 (ref 1.005–1.030)

## 2017-05-06 LAB — LIPASE, BLOOD: Lipase: 18 U/L (ref 11–51)

## 2017-05-06 MED ORDER — MORPHINE SULFATE (PF) 4 MG/ML IV SOLN
4.0000 mg | Freq: Once | INTRAVENOUS | Status: AC
  Administered 2017-05-06: 4 mg via INTRAVENOUS
  Filled 2017-05-06: qty 1

## 2017-05-06 MED ORDER — IOPAMIDOL (ISOVUE-300) INJECTION 61%
INTRAVENOUS | Status: AC
  Administered 2017-05-06: 30 mL via ORAL
  Filled 2017-05-06: qty 30

## 2017-05-06 MED ORDER — IOPAMIDOL (ISOVUE-300) INJECTION 61%
100.0000 mL | Freq: Once | INTRAVENOUS | Status: AC | PRN
  Administered 2017-05-06: 100 mL via INTRAVENOUS

## 2017-05-06 MED ORDER — SODIUM CHLORIDE 0.9 % IV BOLUS (SEPSIS)
1000.0000 mL | Freq: Once | INTRAVENOUS | Status: AC
  Administered 2017-05-06: 1000 mL via INTRAVENOUS

## 2017-05-06 MED ORDER — MAGNESIUM CITRATE PO SOLN: 1.0000 | Freq: Every day | ORAL | 5 refills | Status: DC | PRN

## 2017-05-06 MED ORDER — ONDANSETRON HCL 4 MG/2ML IJ SOLN
4.0000 mg | Freq: Once | INTRAMUSCULAR | Status: AC
  Administered 2017-05-06: 4 mg via INTRAVENOUS
  Filled 2017-05-06: qty 2

## 2017-05-06 MED ORDER — SENNOSIDES-DOCUSATE SODIUM 8.6-50 MG PO TABS: 2.0000 | ORAL_TABLET | Freq: Every day | ORAL | 0 refills | Status: AC

## 2017-05-06 NOTE — ED Provider Notes (Signed)
[facility] EMERGENCY DEPARTMENT Provider Note   CSN: [id number] Arrival date & time: 05/06/17  1528     History   Chief Complaint Chief Complaint  Patient presents with  . Abdominal Pain    HPI Vanessa Bowen is a 41 y.o. female.  HPI  Patient presents with concern of abdominal pain. She notes that she has had intermittent pain for about 2 weeks, but over the past 24 hours the pain is been persistent Pain is in the right lateral abdomen, and flank. There is associated nausea, anorexia. No changes in bowel movements. Diminished urine output. No objective fever. Patient has a history of laparoscopic abdominal surgery, anxiety, but states that she is generally well, and was well prior to the onset of this illness. Since onset no relief with OTC medication or hydrocodone which she takes for chronic pain from prior accident.   Past Medical History:  Diagnosis Date  . Anxiety   . Arthritis   . Chronic back pain   . Colitis, ulcerative ([place])   . Depression   . Elevated blood pressure, situational   . Hypothyroidism   . Migraine     Patient Active Problem List   Diagnosis Date Noted  . S/P vaginal hysterectomy 09/09/2016  . Colitis, ulcerative ([place]) 03/31/2016  . Other specified hypothyroidism 03/18/2016  . WEIGHT LOSS, RECENT 09/23/2009  . NAUSEA WITH VOMITING 09/23/2009  . DIARRHEA, ACUTE 09/23/2009  . EPIGASTRIC PAIN 09/23/2009  . PUD, HX OF 09/23/2009  . ULCERATIVE COLITIS, HX OF 09/23/2009    Past Surgical History:  Procedure Laterality Date  . ANKLE SURGERY    . BREAST ENHANCEMENT SURGERY    . CESAREAN SECTION    . KNEE SURGERY    . LAPAROSCOPIC BILATERAL SALPINGECTOMY  07/27/2012   Procedure: LAPAROSCOPIC BILATERAL SALPINGECTOMY;  Surgeon: Florian Buff, MD;  Location: [facility];  Service: Gynecology;  Laterality: N/A;  . OOPHORECTOMY Right 09/09/2016   Procedure: RIGHT OOPHORECTOMY;  Surgeon: Florian Buff, MD;  Location: [facility];  Service: Gynecology;   Laterality: Right;  . OVARIAN CYST REMOVAL    . VAGINAL HYSTERECTOMY N/A 09/09/2016   Procedure: HYSTERECTOMY VAGINAL;  Surgeon: Florian Buff, MD;  Location: [facility];  Service: Gynecology;  Laterality: N/A;    OB History    Gravida Para Term Preterm AB Living   3 2 2   1 2    SAB TAB Ectopic Multiple Live Births   1       2       Home Medications    Prior to Admission medications   Medication Sig Start Date End Date Taking? Authorizing Provider  amitriptyline (ELAVIL) 100 MG tablet TAKE ONE TABLET BY MOUTH ONCE DAILY AT BEDTIME 01/12/17   [provider]  clonazePAM (KLONOPIN) 2 MG tablet TAKE ONE-HALD TABLET BY MOUTH TWICE DAILY. *MAY TAKE ONE TABLET AT BEDTIME AS NEEDED FOR ANXIETY 01/10/17   [provider]  HYDROcodone-acetaminophen (NORCO/VICODIN) 5-325 MG tablet Take 1 tablet every 6 (six) hours as needed by mouth for moderate pain (Must last 30 days.Do not take and drive a car or use machinery.). 04/26/17   Carole Civil, MD  ibuprofen (ADVIL,MOTRIN) 200 MG tablet Take 400-600 mg by mouth every 8 (eight) hours as needed (for pain/headaches.).    [provider]  levothyroxine (SYNTHROID, LEVOTHROID) 137 MCG tablet Take 1 tablet (137 mcg total) by mouth daily before breakfast. 05/05/16   Nida, Marella Chimes, MD  Melatonin 5 MG TABS Take 5  mg by mouth at bedtime as needed (for sleep.).    [provider]  metroNIDAZOLE (METROGEL) 0.75 % vaginal gel USE VAGINALLY EVERY NIGHT FOR 5 NIGHTS 02/18/17   Florian Buff, MD  promethazine (PHENERGAN) 25 MG tablet Take 1 tablet (25 mg total) by mouth every 6 (six) hours as needed. 02/01/17   Fredia Sorrow, MD  SAPHRIS 5 MG SUBL 24 hr tablet Place 1 tablet under the tongue 2 (two) times daily. 01/12/17   [provider]  SSD 1 % cream APPLY TO AFFECTED AREA(S) 2 TO 3 TIMES PER DAY 02/18/17   Florian Buff, MD  tiZANidine (ZANAFLEX) 4 MG tablet Take 4-8 mg by mouth See admin instructions. 4 mg  daily as needed for muscle spasms, tension, or mild anxiety & 4-8 mg at bedtime as needed for sleep/anxiety/spasms. 08/18/16   [provider]  topiramate (TOPAMAX) 25 MG tablet Take 25 mg by mouth 2 (two) times daily.    [provider]  topiramate (TOPAMAX) 50 MG tablet Take 50 mg by mouth daily. 07/25/16   [provider]    Family History Family History  Problem Relation Age of Onset  . Cancer Maternal Grandmother   . Cancer Maternal Grandfather   . Hypothyroidism Daughter     Social History Social History   Tobacco Use  . Smoking status: Current Every Day Smoker    Packs/day: 0.50    Years: 15.00    Pack years: 7.50    Types: Cigarettes  . Smokeless tobacco: Never Used  Substance Use Topics  . Alcohol use: Yes    Comment: Occasionally  . Drug use: No     Allergies   Doxycycline and Sulfonamide derivatives   Review of Systems Review of Systems  Constitutional:       Per HPI, otherwise negative  HENT:       Per HPI, otherwise negative  Respiratory:       Per HPI, otherwise negative  Cardiovascular:       Per HPI, otherwise negative  Gastrointestinal: Positive for abdominal pain and nausea. Negative for vomiting.  Endocrine:       Negative aside from HPI  Genitourinary:       Neg aside from HPI   Musculoskeletal:       Per HPI, otherwise negative  Skin: Negative.   Neurological: Negative for syncope.     Physical Exam Updated Vital Signs BP (!) 131/94   Pulse 97   Temp 98.9 F (37.2 C) (Oral)   Resp 18   Ht 5' 3"  (1.6 m)   Wt 79.4 kg (175 lb)   LMP 07/24/2016   SpO2 100%   BMI 31.00 kg/m   Physical Exam  Constitutional: She is oriented to person, place, and time. She appears well-developed and well-nourished. No distress.  HENT:  Head: Normocephalic and atraumatic.  Eyes: Conjunctivae and EOM are normal.  Cardiovascular: Normal rate and regular rhythm.  Pulmonary/Chest: Effort normal and breath sounds normal. No  stridor. No respiratory distress.  Abdominal: She exhibits no distension.  R sided ttp upper and lower w guarding  Musculoskeletal: She exhibits no edema.  Neurological: She is alert and oriented to person, place, and time. No cranial nerve deficit.  Skin: Skin is warm and dry.  Psychiatric: She has a normal mood and affect.  Nursing note and vitals reviewed.    ED Treatments / Results  Labs (all labs ordered are listed, but only abnormal results are displayed) Labs Reviewed  COMPREHENSIVE METABOLIC PANEL - Abnormal; Notable for the following components:      Result Value   Chloride 100 (*)    Glucose, Bld 101 (*)    All other components within normal limits  CBC - Abnormal; Notable for the following components:   Platelets 427 (*)    All other components within normal limits  URINALYSIS, ROUTINE W REFLEX MICROSCOPIC - Abnormal; Notable for the following components:   APPearance HAZY (*)    All other components within normal limits  LIPASE, BLOOD     Radiology US Abdomen Complete  Result Date: 05/06/2017 CLINICAL DATA:  RIGHT flank pain.  RIGHT lower quadrant pain. EXAM: ABDOMEN ULTRASOUND COMPLETE COMPARISON:  None. FINDINGS: Gallbladder: No gallstones or wall thickening visualized. No sonographic Murphy sign noted by sonographer. Common bile duct: Diameter: Normal at 5 mm. Liver: No focal lesion identified. Within normal limits in parenchymal echogenicity. Portal vein is patent on color Doppler imaging with normal direction of blood flow towards the liver. IVC: No abnormality visualized. Pancreas: Visualized portion unremarkable. Spleen: Size and appearance within normal limits. Right Kidney: Length: 10.7 cm. Echogenicity within normal limits. No mass or hydronephrosis visualized. Left Kidney: Length: 10.7 cm. Echogenicity within normal limits. No mass or hydronephrosis visualized. Abdominal aorta: No aneurysm visualized. Other findings: None. IMPRESSION: 1. Normal abdominal  ultrasound. 2. No acute findings. Electronically Signed   By: Suzy Bouchard M.D.   On: 05/06/2017 18:14   Ct Abdomen Pelvis W Contrast  Result Date: 05/06/2017 CLINICAL DATA:  Right lower quadrant abdominal pain EXAM: CT ABDOMEN AND PELVIS WITH CONTRAST TECHNIQUE: Multidetector CT imaging of the abdomen and pelvis was performed using the standard protocol following bolus administration of intravenous contrast. CONTRAST:  100 mL Isovue-300 intravenous COMPARISON:  May 06, 2017, 02/01/2017 10/10/2015 FINDINGS: Lower chest: Bilateral breast implants. Lung bases show no consolidation or effusion. Normal heart size. Hepatobiliary: No focal liver abnormality is seen. No gallstones, gallbladder wall thickening, or biliary dilatation. Pancreas: Unremarkable. No pancreatic ductal dilatation or surrounding inflammatory changes. Spleen: Normal in size without focal abnormality. Adrenals/Urinary Tract: Adrenal glands are unremarkable. Kidneys are without hydronephrosis. subcentimeter hypodensity mid right kidney too small to further characterize, but unchanged and therefore likely benign. Bladder is unremarkable. Stomach/Bowel: Stomach is within normal limits. Appendix appears normal. No evidence of bowel wall thickening, distention, or inflammatory changes. Vascular/Lymphatic: No significant vascular findings are present. No enlarged abdominal or pelvic lymph nodes. Reproductive: Status post hysterectomy. No adnexal masses. Other: Trace free fluid in the pelvis. Negative for free air. Small fat in the umbilicus. Musculoskeletal: No acute or significant osseous findings. IMPRESSION: 1. No CT evidence for acute in trach abdominal or pelvic abnormality. 2. Trace free fluid in the pelvis Electronically Signed   By: Donavan Foil M.D.   On: 05/06/2017 21:08    Procedures Procedures (including critical care time)  Medications Ordered in ED Medications  sodium chloride 0.9 % bolus 1,000 mL (0 mLs Intravenous  Stopped 05/06/17 2103)  morphine 4 MG/ML injection 4 mg (4 mg Intravenous Given 05/06/17 1824)  ondansetron (ZOFRAN) injection 4 mg (4 mg Intravenous Given 05/06/17 1824)  iopamidol (ISOVUE-300) 61 % injection (30 mLs Oral Contrast Given 05/06/17 2033)  iopamidol (ISOVUE-300) 61 % injection 100 mL (100 mLs Intravenous Contrast Given 05/06/17 2033)     Initial Impression / Assessment and Plan / ED Course  I have reviewed the triage vital signs and the nursing notes.  Pertinent labs & imaging results that were available during my  care of the patient were reviewed by me and considered in my medical decision making (see chart for details).  9:24 PM Patient in no distress. I discussed these results with her, and we demonstrated the CT images to the patient and her husband. Without evidence for acute abdomen and with some suspicion for constipation contributing to the patient's symptoms, she is discharged in stable condition to follow-up with gastroenterology as needed. Patient was started on a course of bowel movement therapy.  Final Clinical Impressions(s) / ED Diagnoses  Abdominal pain, right-sided   Carmin Muskrat, MD 05/06/17 2125

## 2017-05-06 NOTE — ED Triage Notes (Signed)
Pt c/o RLQ pain that radiates to right lower back, nausea and decreased urine output. Denies vomiting, diarrhea.

## 2017-05-06 NOTE — Discharge Instructions (Signed)
As discussed, your evaluation today has been largely reassuring.  But, it is important that you monitor your condition carefully, and do not hesitate to return to the ED if you develop new, or concerning changes in your condition. ? ?Otherwise, please follow-up with your physician for appropriate ongoing care. ? ?

## 2017-05-20 ENCOUNTER — Encounter: Payer: Self-pay | Admitting: "Endocrinology

## 2017-05-20 ENCOUNTER — Ambulatory Visit (INDEPENDENT_AMBULATORY_CARE_PROVIDER_SITE_OTHER): Payer: Medicaid Other | Admitting: "Endocrinology

## 2017-05-20 VITALS — BP 131/76 | HR 78 | Ht 63.0 in | Wt 179.0 lb

## 2017-05-20 DIAGNOSIS — E038 Other specified hypothyroidism: Secondary | ICD-10-CM | POA: Diagnosis not present

## 2017-05-20 DIAGNOSIS — E6609 Other obesity due to excess calories: Secondary | ICD-10-CM | POA: Diagnosis not present

## 2017-05-20 DIAGNOSIS — Z6831 Body mass index (BMI) 31.0-31.9, adult: Secondary | ICD-10-CM

## 2017-05-20 MED ORDER — LEVOTHYROXINE SODIUM 137 MCG PO TABS: 137.0000 ug | ORAL_TABLET | Freq: Every day | ORAL | 11 refills | Status: DC

## 2017-05-20 NOTE — Progress Notes (Signed)
Subjective:    Patient ID: Vanessa Bowen, female    DOB: 16-Sep-1975, PCP Sharilyn Sites, MD   Past Medical History:  Diagnosis Date  . Anxiety   . Arthritis   . Chronic back pain   . Colitis, ulcerative ([place])   . Depression   . Elevated blood pressure, situational   . Hypothyroidism   . Migraine    Past Surgical History:  Procedure Laterality Date  . ANKLE SURGERY    . BREAST ENHANCEMENT SURGERY    . CESAREAN SECTION    . KNEE SURGERY    . LAPAROSCOPIC BILATERAL SALPINGECTOMY  07/27/2012   Procedure: LAPAROSCOPIC BILATERAL SALPINGECTOMY;  Surgeon: Florian Buff, MD;  Location: [facility];  Service: Gynecology;  Laterality: N/A;  . OOPHORECTOMY Right 09/09/2016   Procedure: RIGHT OOPHORECTOMY;  Surgeon: Florian Buff, MD;  Location: [facility];  Service: Gynecology;  Laterality: Right;  . OVARIAN CYST REMOVAL    . VAGINAL HYSTERECTOMY N/A 09/09/2016   Procedure: HYSTERECTOMY VAGINAL;  Surgeon: Florian Buff, MD;  Location: [facility];  Service: Gynecology;  Laterality: N/A;   Social History   Socioeconomic History  . Marital status: Married    Spouse name: None  . Number of children: None  . Years of education: None  . Highest education level: None  Social Needs  . Financial resource strain: None  . Food insecurity - worry: None  . Food insecurity - inability: None  . Transportation needs - medical: None  . Transportation needs - non-medical: None  Occupational History  . None  Tobacco Use  . Smoking status: Current Every Day Smoker    Packs/day: 0.50    Years: 15.00    Pack years: 7.50    Types: Cigarettes  . Smokeless tobacco: Never Used  Substance and Sexual Activity  . Alcohol use: Yes    Comment: Occasionally  . Drug use: No  . Sexual activity: Not Currently    Birth control/protection: Surgical    Comment: hyst  Other Topics Concern  . None  Social History Narrative  . None   Outpatient Encounter Medications as of 05/20/2017  Medication Sig  . amitriptyline  (ELAVIL) 150 MG tablet TAKE ONE TABLET BY MOUTH ONCE DAILY AT BEDTIME  . clonazePAM (KLONOPIN) 1 MG tablet TAKE ONE-HALD TABLET BY MOUTH TWICE DAILY. *MAY TAKE ONE TABLET AT BEDTIME AS NEEDED FOR ANXIETY  . furosemide (LASIX) 20 MG tablet Take 20 mg daily as needed by mouth for fluid.  Marland Kitchen HYDROcodone-acetaminophen (NORCO/VICODIN) 5-325 MG tablet Take 1 tablet every 6 (six) hours as needed by mouth for moderate pain (Must last 30 days.Do not take and drive a car or use machinery.).  Marland Kitchen ibuprofen (ADVIL,MOTRIN) 200 MG tablet Take 400-600 mg by mouth every 8 (eight) hours as needed (for pain/headaches.).  Marland Kitchen levothyroxine (SYNTHROID, LEVOTHROID) 137 MCG tablet Take 1 tablet (137 mcg total) by mouth daily before breakfast.  . Melatonin 5 MG TABS Take 5 mg by mouth at bedtime as needed (for sleep.).  Marland Kitchen metroNIDAZOLE (METROGEL) 0.75 % vaginal gel USE VAGINALLY EVERY NIGHT FOR 5 NIGHTS (Patient not taking: Reported on 05/06/2017)  . tiZANidine (ZANAFLEX) 4 MG tablet Take 4-8 mg by mouth See admin instructions. 4 mg daily as needed for muscle spasms, tension, or mild anxiety & 4-8 mg at bedtime as needed for sleep/anxiety/spasms.  . [DISCONTINUED] ALPRAZolam (XANAX) 1 MG tablet Take 1 mg 3 (three) times daily as needed by mouth for anxiety.  . [DISCONTINUED] levothyroxine (  SYNTHROID, LEVOTHROID) 137 MCG tablet Take 1 tablet (137 mcg total) by mouth daily before breakfast.  . [DISCONTINUED] magnesium citrate SOLN Take 296 mLs (1 Bottle total) daily as needed by mouth for severe constipation.  . [DISCONTINUED] promethazine (PHENERGAN) 25 MG tablet Take 1 tablet (25 mg total) by mouth every 6 (six) hours as needed. (Patient not taking: Reported on 05/06/2017)  . [DISCONTINUED] SSD 1 % cream APPLY TO AFFECTED AREA(S) 2 TO 3 TIMES PER DAY   No facility-administered encounter medications on file as of 05/20/2017.    ALLERGIES: Allergies  Allergen Reactions  . Doxycycline Nausea And Vomiting  . Sulfonamide  Derivatives Nausea And Vomiting   VACCINATION STATUS:  There is no immunization history on file for this patient.  HPI  41 yr old female with medical hx as follows. She is here for a f/u of hypothyroidism. She was diagnosed at approximate age of 59 years. She no showed since September 2017. She was put on levothyroxine 137 g by mouth every morning. She admits to significant interruptions in her thyroid hormone intake.  She did not afford qsymia and she is not taking it.   Review of Systems Constitutional: + steady weight ,  +fatigue, no subjective hyperthermia/hypothermia Eyes: no blurry vision, no xerophthalmia ENT: no sore throat, no nodules palpated in throat, no dysphagia/odynophagia, no hoarseness Cardiovascular: no CP/SOB/palpitations/leg swelling Respiratory: no cough/SOB Gastrointestinal: no N/V/D/C Musculoskeletal: no muscle/joint aches Skin: no rashes Neurological: no tremors/numbness/tingling/dizziness Psychiatric: +depression  Objective:    BP 131/76   Pulse 78   Ht _0  (1.6 m)   Wt 179 lb (81.2 kg)   LMP 07/24/2016   BMI 31.71 kg/m   Wt Readings from Last 3 Encounters:  05/20/17 179 lb (81.2 kg)  05/06/17 175 lb (79.4 kg)  03/24/17 183 lb (83 kg)    Physical Exam  Constitutional: Obese, in NAD Eyes: PERRLA, EOMI, no exophthalmos ENT: moist mucous membranes, no thyromegaly, no cervical lymphadenopathy Cardiovascular: RRR, No MRG Respiratory: CTA B Gastrointestinal: abdomen soft, NT, ND, BS+ Musculoskeletal: no deformities, strength intact in all 4 Skin: moist, warm, no rashes Neurological: no tremor with outstretched hands, DTR normal in all 4  CMP     Component Value Date/Time   NA 135 05/06/2017 1622   K 3.9 05/06/2017 1622   CL 100 (L) 05/06/2017 1622   CO2 25 05/06/2017 1622   GLUCOSE 101 (H) 05/06/2017 1622   BUN 11 05/06/2017 1622   CREATININE 1.00 05/06/2017 1622   CREATININE 1.05 04/27/2017 1226   CALCIUM 9.6 05/06/2017 1622   PROT  8.0 05/06/2017 1622   ALBUMIN 4.6 05/06/2017 1622   AST 20 05/06/2017 1622   ALT 22 05/06/2017 1622   ALKPHOS 83 05/06/2017 1622   BILITOT 0.4 05/06/2017 1622   GFRNONAA >60 05/06/2017 1622   GFRAA >60 05/06/2017 1622   Recent Results (from the past 2160 hour(s))  T4, Free     Status: Abnormal   Collection Time: 04/27/17 12:26 PM  Result Value Ref Range   Free T4 0.5 (L) 0.8 - 1.8 ng/dL  Comprehensive metabolic panel     Status: None   Collection Time: 04/27/17 12:26 PM  Result Value Ref Range   Glucose, Bld 84 65 - 99 mg/dL    Comment: .            Fasting reference interval .    BUN 18 7 - 25 mg/dL   Creat 1.05 0.50 - 1.10 mg/dL   BUN/Creatinine Ratio  NOT APPLICABLE 6 - 22 (calc)   Sodium 138 135 - 146 mmol/L   Potassium 4.5 3.5 - 5.3 mmol/L   Chloride 102 98 - 110 mmol/L   CO2 29 20 - 32 mmol/L   Calcium 10.1 8.6 - 10.2 mg/dL   Total Protein 7.4 6.1 - 8.1 g/dL   Albumin 4.8 3.6 - 5.1 g/dL   Globulin 2.6 1.9 - 3.7 g/dL (calc)   AG Ratio 1.8 1.0 - 2.5 (calc)   Total Bilirubin 0.4 0.2 - 1.2 mg/dL   Alkaline phosphatase (APISO) 74 33 - 115 U/L   AST 17 10 - 30 U/L   ALT 21 6 - 29 U/L  Hemoglobin A1c     Status: None   Collection Time: 04/27/17 12:26 PM  Result Value Ref Range   Hgb A1c MFr Bld 5.2 <5.7 % of total Hgb    Comment: For the purpose of screening for the presence of diabetes: . <5.7%       Consistent with the absence of diabetes 5.7-6.4%    Consistent with increased risk for diabetes             (prediabetes) > or =6.5%  Consistent with diabetes . This assay result is consistent with a decreased risk of diabetes. . Currently, no consensus exists regarding use of hemoglobin A1c for diagnosis of diabetes in children. . According to American Diabetes Association (ADA) guidelines, hemoglobin A1c <7.0% represents optimal control in non-pregnant diabetic patients. Different metrics may apply to specific patient populations.  Standards of Medical Care in  Diabetes(ADA). .    Mean Plasma Glucose 103 (calc)   eAG (mmol/L) 5.7 (calc)  TSH     Status: Abnormal   Collection Time: 04/27/17 12:26 PM  Result Value Ref Range   TSH 100.97 (H) mIU/L    Comment:           Reference Range .           > or = 20 Years  0.40-4.50 .                Pregnancy Ranges           First trimester    0.26-2.66           Second trimester   0.55-2.73           Third trimester    0.43-2.91   Urinalysis, Routine w reflex microscopic     Status: Abnormal   Collection Time: 05/06/17  3:45 PM  Result Value Ref Range   Color, Urine YELLOW YELLOW   APPearance HAZY (A) CLEAR   Specific Gravity, Urine 1.015 1.005 - 1.030   pH 6.0 5.0 - 8.0   Glucose, UA NEGATIVE NEGATIVE mg/dL   Hgb urine dipstick NEGATIVE NEGATIVE   Bilirubin Urine NEGATIVE NEGATIVE   Ketones, ur NEGATIVE NEGATIVE mg/dL   Protein, ur NEGATIVE NEGATIVE mg/dL   Nitrite NEGATIVE NEGATIVE   Leukocytes, UA NEGATIVE NEGATIVE  Lipase, blood     Status: None   Collection Time: 05/06/17  4:22 PM  Result Value Ref Range   Lipase 18 11 - 51 U/L  Comprehensive metabolic panel     Status: Abnormal   Collection Time: 05/06/17  4:22 PM  Result Value Ref Range   Sodium 135 135 - 145 mmol/L   Potassium 3.9 3.5 - 5.1 mmol/L   Chloride 100 (L) 101 - 111 mmol/L   CO2 25 22 - 32 mmol/L   Glucose, Bld 101 (H)  65 - 99 mg/dL   BUN 11 6 - 20 mg/dL   Creatinine, Ser 1.00 0.44 - 1.00 mg/dL   Calcium 9.6 8.9 - 10.3 mg/dL   Total Protein 8.0 6.5 - 8.1 g/dL   Albumin 4.6 3.5 - 5.0 g/dL   AST 20 15 - 41 U/L   ALT 22 14 - 54 U/L   Alkaline Phosphatase 83 38 - 126 U/L   Total Bilirubin 0.4 0.3 - 1.2 mg/dL   GFR calc non Af Amer >60 >60 mL/min   GFR calc Af Amer >60 >60 mL/min    Comment: (NOTE) The eGFR has been calculated using the CKD EPI equation. This calculation has not been validated in all clinical situations. eGFR's persistently <60 mL/min signify possible Chronic Kidney Disease.    Anion gap 10 5 -  15  CBC     Status: Abnormal   Collection Time: 05/06/17  4:22 PM  Result Value Ref Range   WBC 9.9 4.0 - 10.5 K/uL   RBC 4.60 3.87 - 5.11 MIL/uL   Hemoglobin 14.6 12.0 - 15.0 g/dL   HCT 43.6 36.0 - 46.0 %   MCV 94.8 78.0 - 100.0 fL   MCH 31.7 26.0 - 34.0 pg   MCHC 33.5 30.0 - 36.0 g/dL   RDW 14.1 11.5 - 15.5 %   Platelets 427 (H) 150 - 400 K/uL    Assessment & Plan:   1. Other specified hypothyroidism - Once again, she showed up after not showing up for follow-up for   over a year. Her thyroid hormone was interrupted for 2-3 months.  Her labs are reviewed , c/w discount interruption in her thyroid hormone intake. I urged her to resume and continue levothyroxine 137 mcg po qam.   - We discussed about correct intake of levothyroxine, at fasting, with water, separated by at least 30 minutes from breakfast, and separated by more than 4 hours from calcium, iron, multivitamins, acid reflux medications (PPIs). -Patient is made aware of the fact that thyroid hormone replacement is needed for life, dose to be adjusted by periodic monitoring of thyroid function tests.  2. Obesity with BMI of 33 -In the past , she has taken and benefited from Qsymia , lost 39 pounds. However, recently she has problems with her insurance covering this medication. I advised her to stay off of this medication. -  she has no contraindications.  - I advised patient to maintain close follow up with Sharilyn Sites, MD for primary care needs. Follow up plan: Return in about 6 months (around 11/17/2017) for follow up with pre-visit labs.  Glade Lloyd, MD Phone: [phone]  Fax: [phone]  -  This note was partially dictated with voice recognition software. Similar sounding words can be transcribed inadequately or may not  be corrected upon review.  05/20/2017, 9:57 AM

## 2017-05-25 ENCOUNTER — Other Ambulatory Visit: Payer: Self-pay | Admitting: Orthopedic Surgery

## 2017-05-25 MED ORDER — HYDROCODONE-ACETAMINOPHEN 5-325 MG PO TABS: 1.0000 | ORAL_TABLET | Freq: Four times a day (QID) | ORAL | 0 refills | Status: DC | PRN

## 2017-05-25 NOTE — Telephone Encounter (Signed)
Dr Raliegh Ip patient

## 2017-06-23 ENCOUNTER — Other Ambulatory Visit: Payer: Self-pay | Admitting: Orthopaedic Surgery

## 2017-06-23 MED ORDER — HYDROCODONE-ACETAMINOPHEN 5-325 MG PO TABS: 1.0000 | ORAL_TABLET | Freq: Four times a day (QID) | ORAL | 0 refills | Status: DC | PRN

## 2017-06-24 ENCOUNTER — Encounter: Payer: Self-pay | Admitting: Orthopaedic Surgery

## 2017-06-24 ENCOUNTER — Ambulatory Visit: Payer: Medicaid Other | Admitting: Orthopaedic Surgery

## 2017-06-24 VITALS — BP 117/80 | HR 90 | Temp 98.7°F | Ht 63.0 in | Wt 174.0 lb

## 2017-06-24 DIAGNOSIS — M545 Low back pain, unspecified: Secondary | ICD-10-CM

## 2017-06-24 DIAGNOSIS — G8929 Other chronic pain: Secondary | ICD-10-CM | POA: Diagnosis not present

## 2017-06-24 DIAGNOSIS — F17210 Nicotine dependence, cigarettes, uncomplicated: Secondary | ICD-10-CM | POA: Diagnosis not present

## 2017-06-24 NOTE — Patient Instructions (Signed)

## 2017-06-24 NOTE — Progress Notes (Signed)
Patient PY:PPJKD Vanessa Bowen, female DOB:[birth year], 42 y.o. TOI:[id number]  Chief Complaint  Patient presents with  . Back Pain    HPI  Vanessa Bowen is a 42 y.o. female who has chronic lower back pain.  She has had increased pain with the cold weather. She has no new trauma, no weakness.  She is doing her exercises and taking her medicine.  HPI  Body mass index is 30.82 kg/m.  ROS  Review of Systems  HENT: Negative for congestion.   Respiratory: Negative for cough and shortness of breath.   Cardiovascular: Negative for chest pain and leg swelling.  Endocrine: Positive for cold intolerance.  Musculoskeletal: Positive for arthralgias and back pain.  Allergic/Immunologic: Positive for environmental allergies.  All other systems reviewed and are negative.   Past Medical History:  Diagnosis Date  . Anxiety   . Arthritis   . Chronic back pain   . Colitis, ulcerative ([place])   . Depression   . Elevated blood pressure, situational   . Hypothyroidism   . Migraine     Past Surgical History:  Procedure Laterality Date  . ANKLE SURGERY    . BREAST ENHANCEMENT SURGERY    . CESAREAN SECTION    . KNEE SURGERY    . LAPAROSCOPIC BILATERAL SALPINGECTOMY  07/27/2012   Procedure: LAPAROSCOPIC BILATERAL SALPINGECTOMY;  Surgeon: Florian Buff, MD;  Location: [facility];  Service: Gynecology;  Laterality: N/A;  . OOPHORECTOMY Right 09/09/2016   Procedure: RIGHT OOPHORECTOMY;  Surgeon: Florian Buff, MD;  Location: [facility];  Service: Gynecology;  Laterality: Right;  . OVARIAN CYST REMOVAL    . VAGINAL HYSTERECTOMY N/A 09/09/2016   Procedure: HYSTERECTOMY VAGINAL;  Surgeon: Florian Buff, MD;  Location: [facility];  Service: Gynecology;  Laterality: N/A;    Family History  Problem Relation Age of Onset  . Cancer Maternal Grandmother   . Cancer Maternal Grandfather   . Hypothyroidism Daughter     Social History Social History   Tobacco Use  . Smoking status: Current Every Day Smoker    Packs/day:  0.50    Years: 15.00    Pack years: 7.50    Types: Cigarettes  . Smokeless tobacco: Never Used  Substance Use Topics  . Alcohol use: Yes    Comment: Occasionally  . Drug use: No    Allergies  Allergen Reactions  . Doxycycline Nausea And Vomiting  . Sulfonamide Derivatives Nausea And Vomiting    Current Outpatient Medications  Medication Sig Dispense Refill  . amitriptyline (ELAVIL) 150 MG tablet TAKE ONE TABLET BY MOUTH ONCE DAILY AT BEDTIME  1  . clonazePAM (KLONOPIN) 1 MG tablet TAKE ONE-HALD TABLET BY MOUTH TWICE DAILY. *MAY TAKE ONE TABLET AT BEDTIME AS NEEDED FOR ANXIETY  1  . furosemide (LASIX) 20 MG tablet Take 20 mg daily as needed by mouth for fluid.  2  . HYDROcodone-acetaminophen (NORCO/VICODIN) 5-325 MG tablet Take 1 tablet by mouth every 6 (six) hours as needed for moderate pain (Must last 30 days.Do not take and drive a car or use machinery.). 75 tablet 0  . ibuprofen (ADVIL,MOTRIN) 200 MG tablet Take 400-600 mg by mouth every 8 (eight) hours as needed (for pain/headaches.).    Marland Kitchen levothyroxine (SYNTHROID, LEVOTHROID) 137 MCG tablet Take 1 tablet (137 mcg total) by mouth daily before breakfast. 30 tablet 11  . Melatonin 5 MG TABS Take 5 mg by mouth at bedtime as needed (for sleep.).    Marland Kitchen metroNIDAZOLE (METROGEL) 0.75 % vaginal gel USE  VAGINALLY EVERY NIGHT FOR 5 NIGHTS (Patient not taking: Reported on 05/06/2017) 70 g 0  . tiZANidine (ZANAFLEX) 4 MG tablet Take 4-8 mg by mouth See admin instructions. 4 mg daily as needed for muscle spasms, tension, or mild anxiety & 4-8 mg at bedtime as needed for sleep/anxiety/spasms.     No current facility-administered medications for this visit.      Physical Exam  Blood pressure 117/80, pulse 90, temperature 98.7 F (37.1 C), height 5' 3"  (1.6 m), weight 174 lb (78.9 kg), last menstrual period 07/24/2016.  Constitutional: overall normal hygiene, normal nutrition, well developed, normal grooming, normal body  habitus. Assistive device:none  Musculoskeletal: gait and station Limp none, muscle tone and strength are normal, no tremors or atrophy is present.  .  Neurological: coordination overall normal.  Deep tendon reflex/nerve stretch intact.  Sensation normal.  Cranial nerves II-XII intact.   Skin:   Normal overall no scars, lesions, ulcers or rashes. No psoriasis.  Psychiatric: Alert and oriented x 3.  Recent memory intact, remote memory unclear.  Normal mood and affect. Well groomed.  Good eye contact.  Cardiovascular: overall no swelling, no varicosities, no edema bilaterally, normal temperatures of the legs and arms, no clubbing, cyanosis and good capillary refill.  Lymphatic: palpation is normal.  All other systems reviewed and are negative   Spine/Pelvis examination:  Inspection:  Overall, sacoiliac joint benign and hips nontender; without crepitus or defects.   Thoracic spine inspection: Alignment normal without kyphosis present   Lumbar spine inspection:  Alignment  with normal lumbar lordosis, without scoliosis apparent.   Thoracic spine palpation:  without tenderness of spinal processes   Lumbar spine palpation: without tenderness of lumbar area; without tightness of lumbar muscles    Range of Motion:   Lumbar flexion, forward flexion is normal without pain or tenderness    Lumbar extension is full without pain or tenderness   Left lateral bend is normal without pain or tenderness   Right lateral bend is normal without pain or tenderness   Straight leg raising is normal  Strength & tone: normal   Stability overall normal stability The patient has been educated about the nature of the problem(s) and counseled on treatment options.  The patient appeared to understand what I have discussed and is in agreement with it.  Encounter Diagnoses  Name Primary?  . Chronic midline low back pain without sciatica Yes  . Cigarette nicotine dependence without complication      PLAN Call if any problems.  Precautions discussed.  Continue current medications.   Return to clinic 4 months   Electronically Signed Sanjuana Kava, MD 1/3/20192:10 PM

## 2017-07-21 ENCOUNTER — Other Ambulatory Visit: Payer: Self-pay | Admitting: Orthopaedic Surgery

## 2017-07-22 MED ORDER — HYDROCODONE-ACETAMINOPHEN 5-325 MG PO TABS: 1.0000 | ORAL_TABLET | Freq: Four times a day (QID) | ORAL | 0 refills | Status: DC | PRN

## 2017-08-17 ENCOUNTER — Other Ambulatory Visit: Payer: Self-pay | Admitting: Orthopaedic Surgery

## 2017-08-17 MED ORDER — HYDROCODONE-ACETAMINOPHEN 5-325 MG PO TABS: 1.0000 | ORAL_TABLET | Freq: Four times a day (QID) | ORAL | 0 refills | Status: DC | PRN

## 2017-09-15 ENCOUNTER — Other Ambulatory Visit: Payer: Self-pay | Admitting: Orthopaedic Surgery

## 2017-09-15 MED ORDER — HYDROCODONE-ACETAMINOPHEN 5-325 MG PO TABS: 1.0000 | ORAL_TABLET | Freq: Four times a day (QID) | ORAL | 0 refills | Status: DC | PRN

## 2017-10-13 ENCOUNTER — Other Ambulatory Visit: Payer: Self-pay | Admitting: Orthopedic Surgery

## 2017-10-13 MED ORDER — HYDROCODONE-ACETAMINOPHEN 5-325 MG PO TABS: 1.0000 | ORAL_TABLET | Freq: Four times a day (QID) | ORAL | 0 refills | Status: DC | PRN

## 2017-10-26 ENCOUNTER — Ambulatory Visit: Payer: Medicaid Other | Admitting: Orthopaedic Surgery

## 2017-11-02 ENCOUNTER — Ambulatory Visit: Payer: Self-pay | Admitting: Orthopaedic Surgery

## 2017-11-18 ENCOUNTER — Ambulatory Visit: Payer: Medicaid Other | Admitting: "Endocrinology

## 2017-11-30 ENCOUNTER — Encounter: Payer: Self-pay | Admitting: Orthopaedic Surgery

## 2017-11-30 ENCOUNTER — Ambulatory Visit: Payer: 59 | Admitting: Orthopaedic Surgery

## 2017-11-30 VITALS — BP 124/83 | HR 106 | Ht 63.0 in | Wt 177.0 lb

## 2017-11-30 DIAGNOSIS — M545 Low back pain: Secondary | ICD-10-CM

## 2017-11-30 DIAGNOSIS — G8929 Other chronic pain: Secondary | ICD-10-CM

## 2017-11-30 DIAGNOSIS — F17210 Nicotine dependence, cigarettes, uncomplicated: Secondary | ICD-10-CM

## 2017-11-30 MED ORDER — HYDROCODONE-ACETAMINOPHEN 7.5-325 MG PO TABS: 1.0000 | ORAL_TABLET | Freq: Four times a day (QID) | ORAL | 0 refills | Status: DC | PRN

## 2017-11-30 NOTE — Progress Notes (Signed)
Patient Vanessa Bowen, female DOB:1975/08/24, 42 y.o. JYN:[id number]  Chief Complaint  Patient presents with  . Follow-up    Low Back Pain    HPI  Vanessa Bowen is a 42 y.o. female who has chronic lower back pain. She has good and bad days.  She has no paresthesias, no weakness.  I have talked to her about her exercises and have recommended swimming.  She will consider the swimming.  She is still smoking but has cut back some. HPI  Body mass index is 31.35 kg/m.  ROS  Review of Systems  HENT: Negative for congestion.   Respiratory: Negative for cough and shortness of breath.   Cardiovascular: Negative for chest pain and leg swelling.  Endocrine: Positive for cold intolerance.  Musculoskeletal: Positive for arthralgias and back pain.  Allergic/Immunologic: Positive for environmental allergies.  All other systems reviewed and are negative.   Past Medical History:  Diagnosis Date  . Anxiety   . Arthritis   . Chronic back pain   . Colitis, ulcerative ([place])   . Depression   . Elevated blood pressure, situational   . Hypothyroidism   . Migraine     Past Surgical History:  Procedure Laterality Date  . ANKLE SURGERY    . BREAST ENHANCEMENT SURGERY    . CESAREAN SECTION    . KNEE SURGERY    . LAPAROSCOPIC BILATERAL SALPINGECTOMY  07/27/2012   Procedure: LAPAROSCOPIC BILATERAL SALPINGECTOMY;  Surgeon: Florian Buff, MD;  Location: [facility];  Service: Gynecology;  Laterality: N/A;  . OOPHORECTOMY Right 09/09/2016   Procedure: RIGHT OOPHORECTOMY;  Surgeon: Florian Buff, MD;  Location: [facility];  Service: Gynecology;  Laterality: Right;  . OVARIAN CYST REMOVAL    . VAGINAL HYSTERECTOMY N/A 09/09/2016   Procedure: HYSTERECTOMY VAGINAL;  Surgeon: Florian Buff, MD;  Location: [facility];  Service: Gynecology;  Laterality: N/A;    Family History  Problem Relation Age of Onset  . Cancer Maternal Grandmother   . Cancer Maternal Grandfather   . Hypothyroidism Daughter     Social  History Social History   Tobacco Use  . Smoking status: Current Every Day Smoker    Packs/day: 0.50    Years: 15.00    Pack years: 7.50    Types: Cigarettes  . Smokeless tobacco: Never Used  Substance Use Topics  . Alcohol use: Yes    Comment: Occasionally  . Drug use: No    Allergies  Allergen Reactions  . Doxycycline Nausea And Vomiting  . Sulfonamide Derivatives Nausea And Vomiting    Current Outpatient Medications  Medication Sig Dispense Refill  . amitriptyline (ELAVIL) 150 MG tablet TAKE ONE TABLET BY MOUTH ONCE DAILY AT BEDTIME  1  . clonazePAM (KLONOPIN) 1 MG tablet TAKE ONE-HALD TABLET BY MOUTH TWICE DAILY. *MAY TAKE ONE TABLET AT BEDTIME AS NEEDED FOR ANXIETY  1  . furosemide (LASIX) 20 MG tablet Take 20 mg daily as needed by mouth for fluid.  2  . HYDROcodone-acetaminophen (NORCO) 7.5-325 MG tablet Take 1 tablet by mouth every 6 (six) hours as needed for moderate pain (Must last 30 days.). 75 tablet 0  . ibuprofen (ADVIL,MOTRIN) 200 MG tablet Take 400-600 mg by mouth every 8 (eight) hours as needed (for pain/headaches.).    Marland Kitchen levothyroxine (SYNTHROID, LEVOTHROID) 137 MCG tablet Take 1 tablet (137 mcg total) by mouth daily before breakfast. 30 tablet 11  . Melatonin 5 MG TABS Take 5 mg by mouth at bedtime as needed (for sleep.).    Marland Kitchen  metroNIDAZOLE (METROGEL) 0.75 % vaginal gel USE VAGINALLY EVERY NIGHT FOR 5 NIGHTS (Patient not taking: Reported on 05/06/2017) 70 g 0  . tiZANidine (ZANAFLEX) 4 MG tablet Take 4-8 mg by mouth See admin instructions. 4 mg daily as needed for muscle spasms, tension, or mild anxiety & 4-8 mg at bedtime as needed for sleep/anxiety/spasms.     No current facility-administered medications for this visit.      Physical Exam  Blood pressure 124/83, pulse (!) 106, height 5' 3"  (1.6 m), weight 177 lb (80.3 kg), last menstrual period 07/24/2016.  Constitutional: overall normal hygiene, normal nutrition, well developed, normal grooming, normal  body habitus. Assistive device:none  Musculoskeletal: gait and station Limp none, muscle tone and strength are normal, no tremors or atrophy is present.  .  Neurological: coordination overall normal.  Deep tendon reflex/nerve stretch intact.  Sensation normal.  Cranial nerves II-XII intact.   Skin:   Normal overall no scars, lesions, ulcers or rashes. No psoriasis.  Psychiatric: Alert and oriented x 3.  Recent memory intact, remote memory unclear.  Normal mood and affect. Well groomed.  Good eye contact.  Cardiovascular: overall no swelling, no varicosities, no edema bilaterally, normal temperatures of the legs and arms, no clubbing, cyanosis and good capillary refill.  Lymphatic: palpation is normal.  Spine/Pelvis examination:  Inspection:  Overall, sacoiliac joint benign and hips nontender; without crepitus or defects.   Thoracic spine inspection: Alignment normal without kyphosis present   Lumbar spine inspection:  Alignment  with normal lumbar lordosis, without scoliosis apparent.   Thoracic spine palpation:  without tenderness of spinal processes   Lumbar spine palpation: without tenderness of lumbar area; without tightness of lumbar muscles    Range of Motion:   Lumbar flexion, forward flexion is normal without pain or tenderness    Lumbar extension is full without pain or tenderness   Left lateral bend is normal without pain or tenderness   Right lateral bend is normal without pain or tenderness   Straight leg raising is normal  Strength & tone: normal   Stability overall normal stability All other systems reviewed and are negative   The patient has been educated about the nature of the problem(s) and counseled on treatment options.  The patient appeared to understand what I have discussed and is in agreement with it.  Encounter Diagnoses  Name Primary?  . Chronic midline low back pain without sciatica Yes  . Cigarette nicotine dependence without complication      PLAN Call if any problems.  Precautions discussed.  Continue current medications.   Return to clinic 3 months   I have reviewed the [place] web site prior to prescribing narcotic medicine for this patient.  Electronically Signed Sanjuana Kava, MD 6/11/201910:03 AM

## 2017-11-30 NOTE — Patient Instructions (Signed)
Steps to Quit Smoking Smoking tobacco can be bad for your health. It can also affect almost every organ in your body. Smoking puts you and people around you at risk for many serious Vanessa Bowen-lasting (chronic) diseases. Quitting smoking is hard, but it is one of the best things that you can do for your health. It is never too late to quit. What are the benefits of quitting smoking? When you quit smoking, you lower your risk for getting serious diseases and conditions. They can include:  Lung cancer or lung disease.  Heart disease.  Stroke.  Heart attack.  Not being able to have children (infertility).  Weak bones (osteoporosis) and broken bones (fractures).  If you have coughing, wheezing, and shortness of breath, those symptoms may get better when you quit. You may also get sick less often. If you are pregnant, quitting smoking can help to lower your chances of having a baby of low birth weight. What can I do to help me quit smoking? Talk with your doctor about what can help you quit smoking. Some things you can do (strategies) include:  Quitting smoking totally, instead of slowly cutting back how much you smoke over a period of time.  Going to in-person counseling. You are more likely to quit if you go to many counseling sessions.  Using resources and support systems, such as: ? Online chats with a counselor. ? Phone quitlines. ? Printed self-help materials. ? Support groups or group counseling. ? Text messaging programs. ? Mobile phone apps or applications.  Taking medicines. Some of these medicines may have nicotine in them. If you are pregnant or breastfeeding, do not take any medicines to quit smoking unless your doctor says it is okay. Talk with your doctor about counseling or other things that can help you.  Talk with your doctor about using more than one strategy at the same time, such as taking medicines while you are also going to in-person counseling. This can help make  quitting easier. What things can I do to make it easier to quit? Quitting smoking might feel very hard at first, but there is a lot that you can do to make it easier. Take these steps:  Talk to your family and friends. Ask them to support and encourage you.  Call phone quitlines, reach out to support groups, or work with a counselor.  Ask people who smoke to not smoke around you.  Avoid places that make you want (trigger) to smoke, such as: ? Bars. ? Parties. ? Smoke-break areas at work.  Spend time with people who do not smoke.  Lower the stress in your life. Stress can make you want to smoke. Try these things to help your stress: ? Getting regular exercise. ? Deep-breathing exercises. ? Yoga. ? Meditating. ? Doing a body scan. To do this, close your eyes, focus on one area of your body at a time from head to toe, and notice which parts of your body are tense. Try to relax the muscles in those areas.  Download or buy apps on your mobile phone or tablet that can help you stick to your quit plan. There are many free apps, such as QuitGuide from the CDC (Centers for Disease Control and Prevention). You can find more support from smokefree.gov and other websites.  This information is not intended to replace advice given to you by your health care provider. Make sure you discuss any questions you have with your health care provider. Document Released: 04/04/2009 Document   Revised: 02/04/2016 Document Reviewed: 10/23/2014 Elsevier Interactive Patient Education  2018 Elsevier Inc.  

## 2017-12-02 ENCOUNTER — Telehealth: Payer: Self-pay | Admitting: Radiology

## 2017-12-02 NOTE — Telephone Encounter (Signed)
Faxed from to [facility] for her hydrocodone prior auth, could not pull her up on cover my meds.

## 2017-12-08 ENCOUNTER — Telehealth: Payer: Self-pay | Admitting: Radiology

## 2017-12-08 NOTE — Telephone Encounter (Signed)
She needs to sign pain agreement to get medicine.

## 2017-12-08 NOTE — Telephone Encounter (Signed)
[facility] denied payment for Hydrocodone a copy of the denial was sent to the patient by them.

## 2017-12-14 NOTE — Telephone Encounter (Signed)
I spoke with the patient.  She went ahead and paid for her prescription and got the pills.  I told her to remind [place] before her next Rx that she needs to sign a narcotic agreement with [place] before it is filled again.

## 2017-12-22 DIAGNOSIS — K5190 Ulcerative colitis, unspecified, without complications: Secondary | ICD-10-CM | POA: Diagnosis not present

## 2017-12-22 DIAGNOSIS — Z0001 Encounter for general adult medical examination with abnormal findings: Secondary | ICD-10-CM | POA: Diagnosis not present

## 2017-12-22 DIAGNOSIS — I700 Atherosclerosis of aorta: Secondary | ICD-10-CM | POA: Diagnosis not present

## 2017-12-22 DIAGNOSIS — Z1389 Encounter for screening for other disorder: Secondary | ICD-10-CM | POA: Diagnosis not present

## 2017-12-28 ENCOUNTER — Encounter: Payer: Self-pay | Admitting: Radiology

## 2017-12-28 ENCOUNTER — Other Ambulatory Visit: Payer: Self-pay | Admitting: Orthopaedic Surgery

## 2017-12-28 MED ORDER — HYDROCODONE-ACETAMINOPHEN 7.5-325 MG PO TABS: 1.0000 | ORAL_TABLET | Freq: Four times a day (QID) | ORAL | 0 refills | Status: DC | PRN

## 2018-01-14 DIAGNOSIS — Z1389 Encounter for screening for other disorder: Secondary | ICD-10-CM | POA: Diagnosis not present

## 2018-01-25 ENCOUNTER — Other Ambulatory Visit: Payer: Self-pay | Admitting: Orthopaedic Surgery

## 2018-01-25 MED ORDER — HYDROCODONE-ACETAMINOPHEN 7.5-325 MG PO TABS: 1.0000 | ORAL_TABLET | Freq: Four times a day (QID) | ORAL | 0 refills | Status: DC | PRN

## 2018-02-22 ENCOUNTER — Other Ambulatory Visit: Payer: Self-pay

## 2018-02-23 MED ORDER — HYDROCODONE-ACETAMINOPHEN 7.5-325 MG PO TABS: 1.0000 | ORAL_TABLET | Freq: Four times a day (QID) | ORAL | 0 refills | Status: DC | PRN

## 2018-03-02 ENCOUNTER — Ambulatory Visit: Payer: 59 | Admitting: Orthopaedic Surgery

## 2018-03-10 ENCOUNTER — Ambulatory Visit (HOSPITAL_COMMUNITY): Payer: Medicaid Other | Admitting: Psychiatry

## 2018-03-12 ENCOUNTER — Other Ambulatory Visit: Payer: Self-pay

## 2018-03-12 ENCOUNTER — Emergency Department (HOSPITAL_COMMUNITY): Payer: 59

## 2018-03-12 ENCOUNTER — Emergency Department (HOSPITAL_COMMUNITY)
Admission: EM | Admit: 2018-03-12 | Discharge: 2018-03-12 | Disposition: A | Discharge status: Home / Self Care | Payer: 59 | Attending: Emergency Medicine | Admitting: Emergency Medicine

## 2018-03-12 ENCOUNTER — Encounter (HOSPITAL_COMMUNITY): Payer: Self-pay | Admitting: Emergency Medicine

## 2018-03-12 DIAGNOSIS — R1032 Left lower quadrant pain: Secondary | ICD-10-CM | POA: Diagnosis not present

## 2018-03-12 DIAGNOSIS — F17210 Nicotine dependence, cigarettes, uncomplicated: Secondary | ICD-10-CM | POA: Diagnosis not present

## 2018-03-12 DIAGNOSIS — R110 Nausea: Secondary | ICD-10-CM | POA: Insufficient documentation

## 2018-03-12 DIAGNOSIS — R1084 Generalized abdominal pain: Secondary | ICD-10-CM | POA: Diagnosis not present

## 2018-03-12 DIAGNOSIS — E039 Hypothyroidism, unspecified: Secondary | ICD-10-CM | POA: Insufficient documentation

## 2018-03-12 DIAGNOSIS — R52 Pain, unspecified: Secondary | ICD-10-CM | POA: Diagnosis not present

## 2018-03-12 DIAGNOSIS — R195 Other fecal abnormalities: Secondary | ICD-10-CM | POA: Diagnosis not present

## 2018-03-12 LAB — COMPREHENSIVE METABOLIC PANEL
ALBUMIN: 4.4 g/dL (ref 3.5–5.0)
ALK PHOS: 78 U/L (ref 38–126)
ALT: 21 U/L (ref 0–44)
ANION GAP: 13 (ref 5–15)
AST: 18 U/L (ref 15–41)
BUN: 18 mg/dL (ref 6–20)
CALCIUM: 9.8 mg/dL (ref 8.9–10.3)
CHLORIDE: 102 mmol/L (ref 98–111)
CO2: 20 mmol/L — AB (ref 22–32)
Creatinine, Ser: 0.89 mg/dL (ref 0.44–1.00)
GFR calc non Af Amer: >60 mL/min (ref >60)
GLUCOSE: 103 mg/dL — AB (ref 70–99)
POTASSIUM: 4.3 mmol/L (ref 3.5–5.1)
SODIUM: 135 mmol/L (ref 135–145)
Total Bilirubin: 0.4 mg/dL (ref 0.3–1.2)
Total Protein: 8 g/dL (ref 6.5–8.1)

## 2018-03-12 LAB — CBC
HCT: 40.5 % (ref 36.0–46.0)
Hemoglobin: 13.6 g/dL (ref 12.0–15.0)
MCH: 29.4 pg (ref 26.0–34.0)
MCHC: 33.6 g/dL (ref 30.0–36.0)
MCV: 87.7 fL (ref 78.0–100.0)
PLATELETS: 325 10*3/uL (ref 150–400)
RBC: 4.62 MIL/uL (ref 3.87–5.11)
RDW: 13.5 % (ref 11.5–15.5)
WBC: 12.5 10*3/uL — ABNORMAL HIGH (ref 4.0–10.5)

## 2018-03-12 LAB — PROTIME-INR
INR: 0.9
Prothrombin Time: 12 seconds (ref 11.4–15.2)

## 2018-03-12 LAB — URINALYSIS, ROUTINE W REFLEX MICROSCOPIC
Bilirubin Urine: NEGATIVE
Glucose, UA: NEGATIVE mg/dL
Hgb urine dipstick: NEGATIVE
KETONES UR: NEGATIVE mg/dL
LEUKOCYTES UA: NEGATIVE
Nitrite: NEGATIVE
PROTEIN: NEGATIVE mg/dL
Specific Gravity, Urine: 1.024 (ref 1.005–1.030)
pH: 5 (ref 5.0–8.0)

## 2018-03-12 LAB — I-STAT CG4 LACTIC ACID, ED
LACTIC ACID, VENOUS: 0.78 mmol/L (ref 0.5–1.9)
Lactic Acid, Venous: 1.75 mmol/L (ref 0.5–1.9)

## 2018-03-12 LAB — I-STAT BETA HCG BLOOD, ED (MC, WL, AP ONLY)

## 2018-03-12 LAB — LIPASE, BLOOD: LIPASE: 31 U/L (ref 11–51)

## 2018-03-12 MED ORDER — SODIUM CHLORIDE 0.9 % IV BOLUS
1000.0000 mL | Freq: Once | INTRAVENOUS | Status: AC
  Administered 2018-03-12: 1000 mL via INTRAVENOUS

## 2018-03-12 MED ORDER — HYDROMORPHONE HCL 1 MG/ML IJ SOLN
1.0000 mg | Freq: Once | INTRAMUSCULAR | Status: AC
  Administered 2018-03-12: 1 mg via INTRAVENOUS
  Filled 2018-03-12: qty 1

## 2018-03-12 MED ORDER — ONDANSETRON HCL 4 MG/2ML IJ SOLN
4.0000 mg | Freq: Once | INTRAMUSCULAR | Status: AC
  Administered 2018-03-12: 4 mg via INTRAVENOUS
  Filled 2018-03-12: qty 2

## 2018-03-12 MED ORDER — IOPAMIDOL (ISOVUE-300) INJECTION 61%
100.0000 mL | Freq: Once | INTRAVENOUS | Status: AC | PRN
  Administered 2018-03-12: 100 mL via INTRAVENOUS

## 2018-03-12 MED ORDER — HYDROMORPHONE HCL 1 MG/ML IJ SOLN
0.5000 mg | Freq: Once | INTRAMUSCULAR | Status: AC
  Administered 2018-03-12: 0.5 mg via INTRAVENOUS
  Filled 2018-03-12: qty 1

## 2018-03-12 NOTE — ED Triage Notes (Signed)
LLQ constant abd pain. Denies V/D/, or GU sx or vaginal d/c. Motrin and phenergan PTA with no relief.

## 2018-03-12 NOTE — Discharge Instructions (Addendum)
You were evaluated in the Emergency Department and after careful evaluation, we did not find any emergent condition requiring admission or further testing in the hospital.  Your ultrasound and CT scan did not reveal any significant findings.  Your pain is still unexplained, but is most likely related to muscle strain or spasm.  Please follow-up with your regular doctors for further care and reevaluation.  Please return to the Emergency Department if you experience any worsening of your condition.  We encourage you to follow up with a primary care provider.  Thank you for allowing [place] to be a part of your care.
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
## 2018-03-12 NOTE — ED Notes (Signed)
ED Provider at bedside. 

## 2018-03-24 ENCOUNTER — Encounter: Payer: Self-pay | Admitting: Orthopaedic Surgery

## 2018-03-24 ENCOUNTER — Ambulatory Visit (HOSPITAL_COMMUNITY): Payer: Medicaid Other | Admitting: Psychiatry

## 2018-03-24 ENCOUNTER — Ambulatory Visit (INDEPENDENT_AMBULATORY_CARE_PROVIDER_SITE_OTHER): Payer: 59 | Admitting: Orthopaedic Surgery

## 2018-03-24 VITALS — BP 121/76 | HR 101 | Ht 63.0 in | Wt 176.0 lb

## 2018-03-24 DIAGNOSIS — M545 Low back pain, unspecified: Secondary | ICD-10-CM

## 2018-03-24 DIAGNOSIS — K5190 Ulcerative colitis, unspecified, without complications: Secondary | ICD-10-CM

## 2018-03-24 DIAGNOSIS — F17210 Nicotine dependence, cigarettes, uncomplicated: Secondary | ICD-10-CM

## 2018-03-24 DIAGNOSIS — G8929 Other chronic pain: Secondary | ICD-10-CM | POA: Diagnosis not present

## 2018-03-24 MED ORDER — PREDNISONE 5 MG (21) PO TBPK: ORAL_TABLET | ORAL | 0 refills | Status: DC

## 2018-03-24 MED ORDER — HYDROCODONE-ACETAMINOPHEN 7.5-325 MG PO TABS: 1.0000 | ORAL_TABLET | Freq: Four times a day (QID) | ORAL | 0 refills | Status: DC | PRN

## 2018-03-24 NOTE — Progress Notes (Signed)
Patient Vanessa Bowen, female DOB:08/20/1975, 42 y.o. UYQ:[id number]  Chief Complaint  Patient presents with  . Back Pain    getting worse, going down right leg     HPI  Vanessa Bowen is a 42 y.o. female who has chronic lower back pain.  She has had more pain over the last week or so.  She has no new trauma.  She has no weakness.  She has no numbness.  She is doing her exercises but did not do the swimming.I will add prednisone dose pack today.     Body mass index is 31.18 kg/m.  ROS  Review of Systems  HENT: Negative for congestion.   Respiratory: Negative for cough and shortness of breath.   Cardiovascular: Negative for chest pain and leg swelling.  Endocrine: Positive for cold intolerance.  Musculoskeletal: Positive for arthralgias and back pain.  Allergic/Immunologic: Positive for environmental allergies.  Neurological: Positive for headaches.  Psychiatric/Behavioral: The patient is nervous/anxious.   All other systems reviewed and are negative.   All other systems reviewed and are negative.  The following is a summary of the past history medically, past history surgically, known current medicines, social history and family history.  This information is gathered electronically by the computer from prior information and documentation.  I review this each visit and have found including this information at this point in the chart is beneficial and informative.    Past Medical History:  Diagnosis Date  . Anxiety   . Arthritis   . Chronic back pain   . Colitis, ulcerative ([place])   . Depression   . Elevated blood pressure, situational   . Hypothyroidism   . Migraine     Past Surgical History:  Procedure Laterality Date  . ANKLE SURGERY    . BREAST ENHANCEMENT SURGERY    . CESAREAN SECTION    . KNEE SURGERY    . LAPAROSCOPIC BILATERAL SALPINGECTOMY  07/27/2012   Procedure: LAPAROSCOPIC BILATERAL SALPINGECTOMY;  Surgeon: Florian Buff, MD;  Location: [facility];  Service:  Gynecology;  Laterality: N/A;  . OOPHORECTOMY Right 09/09/2016   Procedure: RIGHT OOPHORECTOMY;  Surgeon: Florian Buff, MD;  Location: [facility];  Service: Gynecology;  Laterality: Right;  . OVARIAN CYST REMOVAL    . VAGINAL HYSTERECTOMY N/A 09/09/2016   Procedure: HYSTERECTOMY VAGINAL;  Surgeon: Florian Buff, MD;  Location: [facility];  Service: Gynecology;  Laterality: N/A;    Family History  Problem Relation Age of Onset  . Cancer Maternal Grandmother   . Cancer Maternal Grandfather   . Hypothyroidism Daughter     Social History Social History   Tobacco Use  . Smoking status: Current Every Day Smoker    Packs/day: 0.50    Years: 15.00    Pack years: 7.50    Types: Cigarettes  . Smokeless tobacco: Never Used  Substance Use Topics  . Alcohol use: Yes    Comment: Occasionally  . Drug use: No    Allergies  Allergen Reactions  . Doxycycline Nausea And Vomiting  . Sulfonamide Derivatives Nausea And Vomiting    Current Outpatient Medications  Medication Sig Dispense Refill  . amitriptyline (ELAVIL) 150 MG tablet TAKE ONE TABLET BY MOUTH ONCE DAILY AT BEDTIME  1  . clonazePAM (KLONOPIN) 1 MG tablet Take 1 tablet by mouth 3 (three) times daily.  4  . furosemide (LASIX) 20 MG tablet Take 20 mg daily as needed by mouth for fluid.  2  . HYDROcodone-acetaminophen (NORCO) 7.5-325 MG tablet  Take 1 tablet by mouth every 6 (six) hours as needed for moderate pain (Must last 30 days.). 70 tablet 0  . ibuprofen (ADVIL,MOTRIN) 200 MG tablet Take 400-600 mg by mouth every 8 (eight) hours as needed (for pain/headaches.).    Marland Kitchen levothyroxine (SYNTHROID, LEVOTHROID) 137 MCG tablet Take 1 tablet (137 mcg total) by mouth daily before breakfast. 30 tablet 11  . Melatonin 5 MG TABS Take 5 mg by mouth at bedtime as needed (for sleep.).    Marland Kitchen tiZANidine (ZANAFLEX) 4 MG tablet Take 4-8 mg by mouth See admin instructions. 4 mg daily as needed for muscle spasms, tension, or mild anxiety & 4-8 mg at bedtime as  needed for sleep/anxiety/spasms.    . predniSONE (STERAPRED UNI-PAK 21 TAB) 5 MG (21) TBPK tablet Take 6 pills first day; 5 pills second day; 4 pills third day; 3 pills fourth day; 2 pills next day and 1 pill last day. 21 tablet 0   No current facility-administered medications for this visit.      Physical Exam  Blood pressure 121/76, pulse (!) 101, height 5' 3"  (1.6 m), weight 176 lb (79.8 kg), last menstrual period 07/24/2016.  Constitutional: overall normal hygiene, normal nutrition, well developed, normal grooming, normal body habitus. Assistive device:none  Musculoskeletal: gait and station Limp none, muscle tone and strength are normal, no tremors or atrophy is present.  .  Neurological: coordination overall normal.  Deep tendon reflex/nerve stretch intact.  Sensation normal.  Cranial nerves II-XII intact.   Skin:   Normal overall no scars, lesions, ulcers or rashes. No psoriasis.  Psychiatric: Alert and oriented x 3.  Recent memory intact, remote memory unclear.  Normal mood and affect. Well groomed.  Good eye contact.  Cardiovascular: overall no swelling, no varicosities, no edema bilaterally, normal temperatures of the legs and arms, no clubbing, cyanosis and good capillary refill.  Lymphatic: palpation is normal.  Spine/Pelvis examination:  Inspection:  Overall, sacoiliac joint benign and hips nontender; without crepitus or defects.   Thoracic spine inspection: Alignment normal without kyphosis present   Lumbar spine inspection:  Alignment  with normal lumbar lordosis, without scoliosis apparent.   Thoracic spine palpation:  without tenderness of spinal processes   Lumbar spine palpation: without tenderness of lumbar area; without tightness of lumbar muscles    Range of Motion:   Lumbar flexion, forward flexion is normal without pain or tenderness    Lumbar extension is full without pain or tenderness   Left lateral bend is normal without pain or tenderness   Right  lateral bend is normal without pain or tenderness   Straight leg raising is normal  Strength & tone: normal   Stability overall normal stability All other systems reviewed and are negative   The patient has been educated about the nature of the problem(s) and counseled on treatment options.  The patient appeared to understand what I have discussed and is in agreement with it.  Encounter Diagnoses  Name Primary?  . Chronic midline low back pain without sciatica Yes  . Cigarette nicotine dependence without complication   . Ulcerative colitis without complications, unspecified location [facility])     PLAN Call if any problems.  Precautions discussed.  Continue current medications.   Return to clinic 3 months   I will begin prednisone dose pack.  The patient has read and signed an Opioid Treatment Agreement which has been scanned and added to the medical record.  The patient understands the agreement and agrees to abide  with it.  The patient has chronic pain that is being treated with an opioid which relieves the pain.  The patient understands potential complications with chronic opioid treatment.   I have reviewed the [place] web site prior to prescribing narcotic medicine for this patient.   Electronically Signed Sanjuana Kava, MD 10/3/20191:47 PM

## 2018-03-25 ENCOUNTER — Ambulatory Visit: Payer: 59 | Admitting: Obstetrics & Gynecology

## 2018-04-01 ENCOUNTER — Ambulatory Visit: Payer: 59 | Admitting: Obstetrics & Gynecology

## 2018-04-08 ENCOUNTER — Ambulatory Visit: Payer: 59 | Admitting: Obstetrics & Gynecology

## 2018-04-11 ENCOUNTER — Ambulatory Visit: Payer: 59 | Admitting: Obstetrics & Gynecology

## 2018-04-18 ENCOUNTER — Other Ambulatory Visit: Payer: Self-pay | Admitting: "Endocrinology

## 2018-04-21 ENCOUNTER — Other Ambulatory Visit: Payer: Self-pay

## 2018-04-21 MED ORDER — HYDROCODONE-ACETAMINOPHEN 7.5-325 MG PO TABS: 1.0000 | ORAL_TABLET | Freq: Four times a day (QID) | ORAL | 0 refills | Status: DC | PRN

## 2018-05-17 ENCOUNTER — Other Ambulatory Visit: Payer: Self-pay | Admitting: Orthopaedic Surgery

## 2018-05-17 MED ORDER — HYDROCODONE-ACETAMINOPHEN 7.5-325 MG PO TABS: 1.0000 | ORAL_TABLET | Freq: Four times a day (QID) | ORAL | 0 refills | Status: DC | PRN

## 2018-05-17 NOTE — Telephone Encounter (Signed)
Dr. Brooke Bonito patient  Hydrocodone-Acetaminophen 7.5/325 mg  Qty 70 Tablets  Take 1 tablet by mouth every 6 (six) hours as needed for moderate pain (Must last 30 days)  PATIENT USES [facility]  Patient is aware that its early and she is aware that her pharmacy will not fill it until it is due.

## 2018-06-06 ENCOUNTER — Ambulatory Visit (INDEPENDENT_AMBULATORY_CARE_PROVIDER_SITE_OTHER): Payer: 59 | Admitting: Obstetrics & Gynecology

## 2018-06-06 ENCOUNTER — Encounter: Payer: Self-pay | Admitting: Obstetrics & Gynecology

## 2018-06-06 VITALS — BP 133/90 | HR 100 | Ht 63.0 in | Wt 182.0 lb

## 2018-06-06 DIAGNOSIS — N951 Menopausal and female climacteric states: Secondary | ICD-10-CM | POA: Diagnosis not present

## 2018-06-06 DIAGNOSIS — N898 Other specified noninflammatory disorders of vagina: Secondary | ICD-10-CM

## 2018-06-06 DIAGNOSIS — N941 Unspecified dyspareunia: Secondary | ICD-10-CM | POA: Diagnosis not present

## 2018-06-06 DIAGNOSIS — M545 Low back pain: Secondary | ICD-10-CM | POA: Diagnosis not present

## 2018-06-06 DIAGNOSIS — G8929 Other chronic pain: Secondary | ICD-10-CM | POA: Diagnosis not present

## 2018-06-06 MED ORDER — ESTRADIOL 1 MG/GM TD GEL: 1.0000 | Freq: Every day | TRANSDERMAL | 11 refills | Status: DC

## 2018-06-06 MED ORDER — ESTROGENS, CONJUGATED 0.625 MG/GM VA CREA: TOPICAL_CREAM | VAGINAL | 12 refills | Status: DC

## 2018-06-06 NOTE — Progress Notes (Signed)
Chief Complaint  Patient presents with  . hot flashes, mood swings, can't lose weight    vaginal dryness, problems with sex      42 y.o. [id number] Patient's last menstrual period was 07/24/2016. The current method of family planning is status post hysterectomy.  Outpatient Encounter Medications as of 06/06/2018  Medication Sig  . amitriptyline (ELAVIL) 150 MG tablet TAKE ONE TABLET BY MOUTH ONCE DAILY AT BEDTIME  . clonazePAM (KLONOPIN) 1 MG tablet Take 1 tablet by mouth 3 (three) times daily.  . furosemide (LASIX) 20 MG tablet Take 20 mg daily as needed by mouth for fluid.  Marland Kitchen HYDROcodone-acetaminophen (NORCO) 7.5-325 MG tablet Take 1 tablet by mouth every 6 (six) hours as needed for moderate pain (Must last 30 days.).  Marland Kitchen ibuprofen (ADVIL,MOTRIN) 200 MG tablet Take 400-600 mg by mouth every 8 (eight) hours as needed (for pain/headaches.).  Marland Kitchen levothyroxine (SYNTHROID, LEVOTHROID) 137 MCG tablet Take 1 tablet (137 mcg total) by mouth daily before breakfast.  . Melatonin 5 MG TABS Take 5 mg by mouth at bedtime as needed (for sleep.).  Marland Kitchen tiZANidine (ZANAFLEX) 4 MG tablet Take 4-8 mg by mouth See admin instructions. 4 mg daily as needed for muscle spasms, tension, or mild anxiety & 4-8 mg at bedtime as needed for sleep/anxiety/spasms.  Marland Kitchen conjugated estrogens (PREMARIN) vaginal cream Use 1 gram nightly  . Estradiol (DIVIGEL) 1 MG/GM GEL Place 1 packet onto the skin daily.  . [DISCONTINUED] predniSONE (STERAPRED UNI-PAK 21 TAB) 5 MG (21) TBPK tablet Take 6 pills first day; 5 pills second day; 4 pills third day; 3 pills fourth day; 2 pills next day and 1 pill last day.   No facility-administered encounter medications on file as of 06/06/2018.     Subjective Pt  with vasomotor symptoms for 6 months Vaginal dryness Dyspareunia Still has left ovary which obviously is not working anymore  Past Medical History:  Diagnosis Date  . Anxiety   . Arthritis   . Chronic back pain   .  Colitis, ulcerative ([place])   . Depression   . Elevated blood pressure, situational   . Hypothyroidism   . Migraine     Past Surgical History:  Procedure Laterality Date  . ANKLE SURGERY    . BREAST ENHANCEMENT SURGERY    . CESAREAN SECTION    . KNEE SURGERY    . LAPAROSCOPIC BILATERAL SALPINGECTOMY  07/27/2012   Procedure: LAPAROSCOPIC BILATERAL SALPINGECTOMY;  Surgeon: Florian Buff, MD;  Location: [facility];  Service: Gynecology;  Laterality: N/A;  . OOPHORECTOMY Right 09/09/2016   Procedure: RIGHT OOPHORECTOMY;  Surgeon: Florian Buff, MD;  Location: [facility];  Service: Gynecology;  Laterality: Right;  . OVARIAN CYST REMOVAL    . VAGINAL HYSTERECTOMY N/A 09/09/2016   Procedure: HYSTERECTOMY VAGINAL;  Surgeon: Florian Buff, MD;  Location: [facility];  Service: Gynecology;  Laterality: N/A;    OB History    Gravida  3   Para  2   Term  2   Preterm      AB  1   Living  2     SAB  1   TAB      Ectopic      Multiple      Live Births  2           Allergies  Allergen Reactions  . Doxycycline Nausea And Vomiting  . Sulfonamide Derivatives Nausea And Vomiting    Social History  Socioeconomic History  . Marital status: Married    Spouse name: Not on file  . Number of children: Not on file  . Years of education: Not on file  . Highest education level: Not on file  Occupational History  . Not on file  Social Needs  . Financial resource strain: Not on file  . Food insecurity:    Worry: Not on file    Inability: Not on file  . Transportation needs:    Medical: Not on file    Non-medical: Not on file  Tobacco Use  . Smoking status: Current Every Day Smoker    Packs/day: 0.50    Years: 15.00    Pack years: 7.50    Types: Cigarettes  . Smokeless tobacco: Never Used  Substance and Sexual Activity  . Alcohol use: Yes    Comment: Occasionally  . Drug use: No  . Sexual activity: Yes    Birth control/protection: Surgical    Comment: hyst  Lifestyle  .  Physical activity:    Days per week: Not on file    Minutes per session: Not on file  . Stress: Not on file  Relationships  . Social connections:    Talks on phone: Not on file    Gets together: Not on file    Attends religious service: Not on file    Active member of club or organization: Not on file    Attends meetings of clubs or organizations: Not on file    Relationship status: Not on file  Other Topics Concern  . Not on file  Social History Narrative  . Not on file    Family History  Problem Relation Age of Onset  . Cancer Maternal Grandmother   . Cancer Maternal Grandfather   . Hypothyroidism Daughter     Medications:       Current Outpatient Medications:  .  amitriptyline (ELAVIL) 150 MG tablet, TAKE ONE TABLET BY MOUTH ONCE DAILY AT BEDTIME, Disp: , Rfl: 1 .  clonazePAM (KLONOPIN) 1 MG tablet, Take 1 tablet by mouth 3 (three) times daily., Disp: , Rfl: 4 .  furosemide (LASIX) 20 MG tablet, Take 20 mg daily as needed by mouth for fluid., Disp: , Rfl: 2 .  HYDROcodone-acetaminophen (NORCO) 7.5-325 MG tablet, Take 1 tablet by mouth every 6 (six) hours as needed for moderate pain (Must last 30 days.)., Disp: 70 tablet, Rfl: 0 .  ibuprofen (ADVIL,MOTRIN) 200 MG tablet, Take 400-600 mg by mouth every 8 (eight) hours as needed (for pain/headaches.)., Disp: , Rfl:  .  levothyroxine (SYNTHROID, LEVOTHROID) 137 MCG tablet, Take 1 tablet (137 mcg total) by mouth daily before breakfast., Disp: 30 tablet, Rfl: 11 .  Melatonin 5 MG TABS, Take 5 mg by mouth at bedtime as needed (for sleep.)., Disp: , Rfl:  .  tiZANidine (ZANAFLEX) 4 MG tablet, Take 4-8 mg by mouth See admin instructions. 4 mg daily as needed for muscle spasms, tension, or mild anxiety & 4-8 mg at bedtime as needed for sleep/anxiety/spasms., Disp: , Rfl:  .  conjugated estrogens (PREMARIN) vaginal cream, Use 1 gram nightly, Disp: 30 g, Rfl: 12 .  Estradiol (DIVIGEL) 1 MG/GM GEL, Place 1 packet onto the skin daily., Disp:  30 g, Rfl: 11  Objective Blood pressure 133/90, pulse 100, height 5' 3"  (1.6 m), weight 182 lb (82.6 kg), last menstrual period 07/24/2016.    Pertinent ROS   Labs or studies     Impression Diagnoses this Encounter::  ICD-10-CM   1. Menopausal syndrome (hot flashes) N95.1   2. Vaginal dryness N89.8   3. Dyspareunia in female N94.10     Established relevant diagnosis(es):   Plan/Recommendations: Meds ordered this encounter  Medications  . conjugated estrogens (PREMARIN) vaginal cream    Sig: Use 1 gram nightly    Dispense:  30 g    Refill:  12  . Estradiol (DIVIGEL) 1 MG/GM GEL    Sig: Place 1 packet onto the skin daily.    Dispense:  30 g    Refill:  11    Labs or Scans Ordered: No orders of the defined types were placed in this encounter.   Management:: >menopausal symptoms will treat with topical estrogen + vaginal estrogen  Follow up Return in about 3 months (around 09/05/2018) for Follow up, with Dr Elonda Husky.        Face to face time:  15 minutes  Greater than 50% of the visit time was spent in counseling and coordination of care with the patient.  The summary and outline of the counseling and care coordination is summarized in the note above.   All questions were answered.

## 2018-06-08 ENCOUNTER — Other Ambulatory Visit: Payer: Self-pay

## 2018-06-08 MED ORDER — HYDROCODONE-ACETAMINOPHEN 7.5-325 MG PO TABS: 1.0000 | ORAL_TABLET | Freq: Four times a day (QID) | ORAL | 0 refills | Status: DC | PRN

## 2018-06-23 ENCOUNTER — Ambulatory Visit: Payer: 59 | Admitting: Orthopaedic Surgery

## 2018-06-29 ENCOUNTER — Ambulatory Visit: Payer: 59 | Admitting: Orthopaedic Surgery

## 2018-07-06 ENCOUNTER — Ambulatory Visit (INDEPENDENT_AMBULATORY_CARE_PROVIDER_SITE_OTHER): Payer: 59

## 2018-07-06 ENCOUNTER — Telehealth: Payer: Self-pay

## 2018-07-06 ENCOUNTER — Encounter: Payer: Self-pay | Admitting: Orthopaedic Surgery

## 2018-07-06 ENCOUNTER — Ambulatory Visit (INDEPENDENT_AMBULATORY_CARE_PROVIDER_SITE_OTHER): Payer: 59 | Admitting: Orthopaedic Surgery

## 2018-07-06 VITALS — BP 131/86 | HR 88 | Ht 63.0 in | Wt 190.0 lb

## 2018-07-06 DIAGNOSIS — E559 Vitamin D deficiency, unspecified: Secondary | ICD-10-CM

## 2018-07-06 DIAGNOSIS — M5441 Lumbago with sciatica, right side: Secondary | ICD-10-CM

## 2018-07-06 DIAGNOSIS — G8929 Other chronic pain: Secondary | ICD-10-CM

## 2018-07-06 DIAGNOSIS — E038 Other specified hypothyroidism: Secondary | ICD-10-CM

## 2018-07-06 DIAGNOSIS — F17210 Nicotine dependence, cigarettes, uncomplicated: Secondary | ICD-10-CM | POA: Diagnosis not present

## 2018-07-06 MED ORDER — PREDNISONE 5 MG (21) PO TBPK: ORAL_TABLET | ORAL | 0 refills | Status: DC

## 2018-07-06 NOTE — Telephone Encounter (Signed)
SIGNED ORDER

## 2018-07-06 NOTE — Progress Notes (Signed)
Patient [name], female DOB:[birth year], 43 y.o. KWI:[id number]  Chief Complaint  Patient presents with  . Back Pain    HPI  Vanessa Bowen is a 43 y.o. female who has lower back pain with sciatica that is getting worse.  She has much more pain.  She has no trauma, no weakness.  Nothing seems to help.   Body mass index is 33.66 kg/m.  ROS  Review of Systems  HENT: Negative for congestion.   Respiratory: Negative for cough and shortness of breath.   Cardiovascular: Negative for chest pain and leg swelling.  Endocrine: Positive for cold intolerance.  Musculoskeletal: Positive for arthralgias and back pain.  Allergic/Immunologic: Positive for environmental allergies.  Neurological: Positive for headaches.  Psychiatric/Behavioral: The patient is nervous/anxious.   All other systems reviewed and are negative.   All other systems reviewed and are negative.  The following is a summary of the past history medically, past history surgically, known current medicines, social history and family history.  This information is gathered electronically by the computer from prior information and documentation.  I review this each visit and have found including this information at this point in the chart is beneficial and informative.    Past Medical History:  Diagnosis Date  . Anxiety   . Arthritis   . Chronic back pain   . Colitis, ulcerative ([place])   . Depression   . Elevated blood pressure, situational   . Hypothyroidism   . Migraine     Past Surgical History:  Procedure Laterality Date  . ANKLE SURGERY    . BREAST ENHANCEMENT SURGERY    . CESAREAN SECTION    . KNEE SURGERY    . LAPAROSCOPIC BILATERAL SALPINGECTOMY  07/27/2012   Procedure: LAPAROSCOPIC BILATERAL SALPINGECTOMY;  Surgeon: Florian Buff, MD;  Location: [facility];  Service: Gynecology;  Laterality: N/A;  . OOPHORECTOMY Right 09/09/2016   Procedure: RIGHT OOPHORECTOMY;  Surgeon: Florian Buff, MD;  Location: [facility];   Service: Gynecology;  Laterality: Right;  . OVARIAN CYST REMOVAL    . VAGINAL HYSTERECTOMY N/A 09/09/2016   Procedure: HYSTERECTOMY VAGINAL;  Surgeon: Florian Buff, MD;  Location: [facility];  Service: Gynecology;  Laterality: N/A;    Family History  Problem Relation Age of Onset  . Cancer Maternal Grandmother   . Cancer Maternal Grandfather   . Hypothyroidism Daughter     Social History Social History   Tobacco Use  . Smoking status: Current Every Day Smoker    Packs/day: 0.50    Years: 15.00    Pack years: 7.50    Types: Cigarettes  . Smokeless tobacco: Never Used  Substance Use Topics  . Alcohol use: Yes    Comment: Occasionally  . Drug use: No    Allergies  Allergen Reactions  . Doxycycline Nausea And Vomiting  . Sulfonamide Derivatives Nausea And Vomiting    Current Outpatient Medications  Medication Sig Dispense Refill  . amitriptyline (ELAVIL) 150 MG tablet TAKE ONE TABLET BY MOUTH ONCE DAILY AT BEDTIME  1  . clonazePAM (KLONOPIN) 1 MG tablet Take 1 tablet by mouth 3 (three) times daily.  4  . conjugated estrogens (PREMARIN) vaginal cream Use 1 gram nightly 30 g 12  . Estradiol (DIVIGEL) 1 MG/GM GEL Place 1 packet onto the skin daily. 30 g 11  . furosemide (LASIX) 20 MG tablet Take 20 mg daily as needed by mouth for fluid.  2  . HYDROcodone-acetaminophen (NORCO) 7.5-325 MG tablet Take 1 tablet by  mouth every 6 (six) hours as needed for moderate pain (Must last 30 days.). 70 tablet 0  . ibuprofen (ADVIL,MOTRIN) 200 MG tablet Take 400-600 mg by mouth every 8 (eight) hours as needed (for pain/headaches.).    Marland Kitchen levothyroxine (SYNTHROID, LEVOTHROID) 137 MCG tablet Take 1 tablet (137 mcg total) by mouth daily before breakfast. 30 tablet 11  . Melatonin 5 MG TABS Take 5 mg by mouth at bedtime as needed (for sleep.).    Marland Kitchen predniSONE (STERAPRED UNI-PAK 21 TAB) 5 MG (21) TBPK tablet Take 6 pills first day; 5 pills second day; 4 pills third day; 3 pills fourth day; 2 pills next  day and 1 pill last day. 21 tablet 0  . tiZANidine (ZANAFLEX) 4 MG tablet Take 4-8 mg by mouth See admin instructions. 4 mg daily as needed for muscle spasms, tension, or mild anxiety & 4-8 mg at bedtime as needed for sleep/anxiety/spasms.     No current facility-administered medications for this visit.      Physical Exam  Blood pressure 131/86, pulse 88, height 5' 3"  (1.6 m), weight 190 lb (86.2 kg), last menstrual period 07/24/2016.  Constitutional: overall normal hygiene, normal nutrition, well developed, normal grooming, normal body habitus. Assistive device:none  Musculoskeletal: gait and station Limp none, muscle tone and strength are normal, no tremors or atrophy is present.  .  Neurological: coordination overall normal.  Deep tendon reflex/nerve stretch intact.  Sensation normal.  Cranial nerves II-XII intact.   Skin:   Normal overall no scars, lesions, ulcers or rashes. No psoriasis.  Psychiatric: Alert and oriented x 3.  Recent memory intact, remote memory unclear.  Normal mood and affect. Well groomed.  Good eye contact.  Cardiovascular: overall no swelling, no varicosities, no edema bilaterally, normal temperatures of the legs and arms, no clubbing, cyanosis and good capillary refill.  Lymphatic: palpation is normal.  Spine/Pelvis examination:  Inspection:  Overall, sacoiliac joint benign and hips nontender; without crepitus or defects.   Thoracic spine inspection: Alignment normal without kyphosis present   Lumbar spine inspection:  Alignment  with normal lumbar lordosis, without scoliosis apparent.   Thoracic spine palpation:  without tenderness of spinal processes   Lumbar spine palpation: with tenderness of lumbar area; with tightness of lumbar muscles    Range of Motion:   Lumbar flexion, forward flexion is 30 with pain or tenderness    Lumbar extension is 5 with pain or tenderness   Left lateral bend is 20  with pain or tenderness   Right lateral bend is 20  with pain or tenderness   Straight leg raising is Normal   Strength & tone: Normal   Stability overall normal stability   All other systems reviewed and are negative   The patient has been educated about the nature of the problem(s) and counseled on treatment options.  The patient appeared to understand what I have discussed and is in agreement with it.  Encounter Diagnoses  Name Primary?  . Chronic right-sided low back pain with right-sided sciatica Yes  . Cigarette nicotine dependence without complication     PLAN Call if any problems.  Precautions discussed.  Continue current medications. I have called in prednisone dose pack.  Return to clinic get MRI of the lumbar spine     Electronically Signed Sanjuana Kava, MD 1/15/20203:27 PM

## 2018-07-07 ENCOUNTER — Telehealth: Payer: Self-pay | Admitting: Obstetrics & Gynecology

## 2018-07-07 NOTE — Telephone Encounter (Signed)
Patient called stating that she spoke with Tish yesterday and was suppose to be talking to Dr.Eure regarding a medication. Pt is just following up, please contact pt.

## 2018-07-08 NOTE — Telephone Encounter (Signed)
Patient informed [facility] approved divigel.

## 2018-07-14 ENCOUNTER — Other Ambulatory Visit: Payer: Self-pay

## 2018-07-14 MED ORDER — HYDROCODONE-ACETAMINOPHEN 7.5-325 MG PO TABS: 1.0000 | ORAL_TABLET | Freq: Four times a day (QID) | ORAL | 0 refills | Status: DC | PRN

## 2018-07-15 ENCOUNTER — Ambulatory Visit (HOSPITAL_COMMUNITY)
Admission: RE | Admit: 2018-07-15 | Discharge: 2018-07-15 | Disposition: A | Discharge status: Home / Self Care | Payer: 59 | Source: Ambulatory Visit | Attending: Orthopaedic Surgery | Admitting: Orthopaedic Surgery

## 2018-07-15 ENCOUNTER — Ambulatory Visit (HOSPITAL_COMMUNITY): Payer: Self-pay | Admitting: Psychiatry

## 2018-07-15 DIAGNOSIS — M545 Low back pain: Secondary | ICD-10-CM | POA: Diagnosis not present

## 2018-07-15 DIAGNOSIS — M5441 Lumbago with sciatica, right side: Secondary | ICD-10-CM | POA: Insufficient documentation

## 2018-07-15 DIAGNOSIS — E038 Other specified hypothyroidism: Secondary | ICD-10-CM | POA: Diagnosis not present

## 2018-07-15 DIAGNOSIS — G8929 Other chronic pain: Secondary | ICD-10-CM | POA: Insufficient documentation

## 2018-07-15 LAB — T4, FREE: Free T4: 0.3 ng/dL — ABNORMAL LOW (ref 0.8–1.8)

## 2018-07-15 LAB — TSH: TSH: 146.19 m[IU]/L — AB

## 2018-07-18 ENCOUNTER — Other Ambulatory Visit: Payer: Self-pay | Admitting: "Endocrinology

## 2018-07-18 MED ORDER — LEVOTHYROXINE SODIUM 137 MCG PO TABS: 137.0000 ug | ORAL_TABLET | Freq: Every day | ORAL | 0 refills | Status: DC

## 2018-07-19 ENCOUNTER — Ambulatory Visit (INDEPENDENT_AMBULATORY_CARE_PROVIDER_SITE_OTHER): Payer: 59 | Admitting: Orthopaedic Surgery

## 2018-07-19 ENCOUNTER — Encounter: Payer: Self-pay | Admitting: Orthopaedic Surgery

## 2018-07-19 DIAGNOSIS — M5441 Lumbago with sciatica, right side: Secondary | ICD-10-CM

## 2018-07-19 DIAGNOSIS — F17210 Nicotine dependence, cigarettes, uncomplicated: Secondary | ICD-10-CM | POA: Diagnosis not present

## 2018-07-19 DIAGNOSIS — G8929 Other chronic pain: Secondary | ICD-10-CM

## 2018-07-19 NOTE — Progress Notes (Signed)
Patient Vanessa Bowen, female DOB:[birth year], 43 y.o. ZGY:[id number]  Chief Complaint  Patient presents with  . Back Pain  . Results    review MRI     HPI  Vanessa Bowen is a 43 y.o. female who has chronic lower back pain.  She has no trauma. She has more pain.  She says the pain is worse and the pain medicine does not help as much.  She had the MRI of the lumbar spine which showed: IMPRESSION: Mild disc desiccation and minimal bulging at L5-S1. The central are widely patent at all levels. No new abnormality since the prior exam.  I have explained the findings to her.  I will set her up to be seen at pain clinic.  She is very agreeable to this.   There is no height or weight on file to calculate BMI.  ROS  Review of Systems  HENT: Negative for congestion.   Respiratory: Negative for cough and shortness of breath.   Cardiovascular: Negative for chest pain and leg swelling.  Endocrine: Positive for cold intolerance.  Musculoskeletal: Positive for arthralgias and back pain.  Allergic/Immunologic: Positive for environmental allergies.  Neurological: Positive for headaches.  Psychiatric/Behavioral: The patient is nervous/anxious.   All other systems reviewed and are negative.   All other systems reviewed and are negative.  The following is a summary of the past history medically, past history surgically, known current medicines, social history and family history.  This information is gathered electronically by the computer from prior information and documentation.  I review this each visit and have found including this information at this point in the chart is beneficial and informative.    Past Medical History:  Diagnosis Date  . Anxiety   . Arthritis   . Chronic back pain   . Colitis, ulcerative ([place])   . Depression   . Elevated blood pressure, situational   . Hypothyroidism   . Migraine     Past Surgical History:  Procedure Laterality Date  . ANKLE SURGERY    .  BREAST ENHANCEMENT SURGERY    . CESAREAN SECTION    . KNEE SURGERY    . LAPAROSCOPIC BILATERAL SALPINGECTOMY  07/27/2012   Procedure: LAPAROSCOPIC BILATERAL SALPINGECTOMY;  Surgeon: Florian Buff, MD;  Location: [facility];  Service: Gynecology;  Laterality: N/A;  . OOPHORECTOMY Right 09/09/2016   Procedure: RIGHT OOPHORECTOMY;  Surgeon: Florian Buff, MD;  Location: [facility];  Service: Gynecology;  Laterality: Right;  . OVARIAN CYST REMOVAL    . VAGINAL HYSTERECTOMY N/A 09/09/2016   Procedure: HYSTERECTOMY VAGINAL;  Surgeon: Florian Buff, MD;  Location: [facility];  Service: Gynecology;  Laterality: N/A;    Family History  Problem Relation Age of Onset  . Cancer Maternal Grandmother   . Cancer Maternal Grandfather   . Hypothyroidism Daughter     Social History Social History   Tobacco Use  . Smoking status: Current Every Day Smoker    Packs/day: 0.50    Years: 15.00    Pack years: 7.50    Types: Cigarettes  . Smokeless tobacco: Never Used  Substance Use Topics  . Alcohol use: Yes    Comment: Occasionally  . Drug use: No    Allergies  Allergen Reactions  . Doxycycline Nausea And Vomiting  . Sulfonamide Derivatives Nausea And Vomiting    Current Outpatient Medications  Medication Sig Dispense Refill  . amitriptyline (ELAVIL) 150 MG tablet TAKE ONE TABLET BY MOUTH ONCE DAILY AT BEDTIME  1  .  clonazePAM (KLONOPIN) 1 MG tablet Take 1 tablet by mouth 3 (three) times daily.  4  . conjugated estrogens (PREMARIN) vaginal cream Use 1 gram nightly 30 g 12  . Estradiol (DIVIGEL) 1 MG/GM GEL Place 1 packet onto the skin daily. 30 g 11  . furosemide (LASIX) 20 MG tablet Take 20 mg daily as needed by mouth for fluid.  2  . HYDROcodone-acetaminophen (NORCO) 7.5-325 MG tablet Take 1 tablet by mouth every 6 (six) hours as needed for moderate pain (Must last 30 days.). 70 tablet 0  . ibuprofen (ADVIL,MOTRIN) 200 MG tablet Take 400-600 mg by mouth every 8 (eight) hours as needed (for  pain/headaches.).    Marland Kitchen levothyroxine (SYNTHROID, LEVOTHROID) 137 MCG tablet Take 1 tablet (137 mcg total) by mouth daily before breakfast. 30 tablet 0  . Melatonin 5 MG TABS Take 5 mg by mouth at bedtime as needed (for sleep.).    Marland Kitchen tiZANidine (ZANAFLEX) 4 MG tablet Take 4-8 mg by mouth See admin instructions. 4 mg daily as needed for muscle spasms, tension, or mild anxiety & 4-8 mg at bedtime as needed for sleep/anxiety/spasms.     No current facility-administered medications for this visit.      Physical Exam  Last menstrual period 07/24/2016.  Constitutional: overall normal hygiene, normal nutrition, well developed, normal grooming, normal body habitus. Assistive device:none  Musculoskeletal: gait and station Limp none, muscle tone and strength are normal, no tremors or atrophy is present.  .  Neurological: coordination overall normal.  Deep tendon reflex/nerve stretch intact.  Sensation normal.  Cranial nerves II-XII intact.   Skin:   Normal overall no scars, lesions, ulcers or rashes. No psoriasis.  Psychiatric: Alert and oriented x 3.  Recent memory intact, remote memory unclear.  Normal mood and affect. Well groomed.  Good eye contact.  Cardiovascular: overall no swelling, no varicosities, no edema bilaterally, normal temperatures of the legs and arms, no clubbing, cyanosis and good capillary refill.  Lymphatic: palpation is normal.  Spine/Pelvis examination:  Inspection:  Overall, sacoiliac joint benign and hips nontender; without crepitus or defects.   Thoracic spine inspection: Alignment normal without kyphosis present   Lumbar spine inspection:  Alignment  with normal lumbar lordosis, without scoliosis apparent.   Thoracic spine palpation:  without tenderness of spinal processes   Lumbar spine palpation: without tenderness of lumbar area; without tightness of lumbar muscles    Range of Motion:   Lumbar flexion, forward flexion is normal without pain or  tenderness    Lumbar extension is full without pain or tenderness   Left lateral bend is normal without pain or tenderness   Right lateral bend is normal without pain or tenderness   Straight leg raising is normal  Strength & tone: normal   Stability overall normal stability  All other systems reviewed and are negative   The patient has been educated about the nature of the problem(s) and counseled on treatment options.  The patient appeared to understand what I have discussed and is in agreement with it.  Encounter Diagnoses  Name Primary?  . Chronic right-sided low back pain with right-sided sciatica Yes  . Cigarette nicotine dependence without complication     PLAN Call if any problems.  Precautions discussed.  Continue current medications.   Return to clinic 6 weeks   Electronically Signed Sanjuana Kava, MD 1/28/202010:59 AM

## 2018-07-19 NOTE — Patient Instructions (Signed)
[place], [place] [phone]

## 2018-07-20 NOTE — Addendum Note (Signed)
Addended by: Elizabeth Sauer on: 07/20/2018 08:08 AM   Modules accepted: Orders

## 2018-07-26 ENCOUNTER — Other Ambulatory Visit (HOSPITAL_COMMUNITY): Payer: Self-pay

## 2018-07-26 ENCOUNTER — Encounter (HOSPITAL_COMMUNITY): Payer: Self-pay | Admitting: Psychiatry

## 2018-07-26 ENCOUNTER — Ambulatory Visit (INDEPENDENT_AMBULATORY_CARE_PROVIDER_SITE_OTHER): Payer: 59 | Admitting: Psychiatry

## 2018-07-26 VITALS — BP 128/78 | Ht 63.0 in | Wt 194.0 lb

## 2018-07-26 DIAGNOSIS — M545 Low back pain: Secondary | ICD-10-CM | POA: Diagnosis not present

## 2018-07-26 DIAGNOSIS — F4310 Post-traumatic stress disorder, unspecified: Secondary | ICD-10-CM

## 2018-07-26 DIAGNOSIS — F331 Major depressive disorder, recurrent, moderate: Secondary | ICD-10-CM | POA: Insufficient documentation

## 2018-07-26 DIAGNOSIS — Z7689 Persons encountering health services in other specified circumstances: Secondary | ICD-10-CM | POA: Diagnosis not present

## 2018-07-26 DIAGNOSIS — F410 Panic disorder [episodic paroxysmal anxiety] without agoraphobia: Secondary | ICD-10-CM

## 2018-07-26 DIAGNOSIS — F320 Major depressive disorder, single episode, mild: Secondary | ICD-10-CM | POA: Diagnosis not present

## 2018-07-26 DIAGNOSIS — Z79899 Other long term (current) drug therapy: Secondary | ICD-10-CM | POA: Diagnosis not present

## 2018-07-26 DIAGNOSIS — F411 Generalized anxiety disorder: Secondary | ICD-10-CM

## 2018-07-26 DIAGNOSIS — G8929 Other chronic pain: Secondary | ICD-10-CM | POA: Diagnosis not present

## 2018-07-26 DIAGNOSIS — E559 Vitamin D deficiency, unspecified: Secondary | ICD-10-CM | POA: Diagnosis not present

## 2018-07-26 DIAGNOSIS — M129 Arthropathy, unspecified: Secondary | ICD-10-CM | POA: Diagnosis not present

## 2018-07-26 DIAGNOSIS — G894 Chronic pain syndrome: Secondary | ICD-10-CM | POA: Diagnosis not present

## 2018-07-26 HISTORY — DX: Panic disorder (episodic paroxysmal anxiety): F41.0

## 2018-07-26 HISTORY — DX: Post-traumatic stress disorder, unspecified: F43.10

## 2018-07-26 HISTORY — DX: Generalized anxiety disorder: F41.1

## 2018-07-26 MED ORDER — ALPRAZOLAM ER 3 MG PO TB24: 3.0000 mg | ORAL_TABLET | ORAL | 0 refills | Status: DC

## 2018-07-26 MED ORDER — DULOXETINE HCL 60 MG PO CPEP: 60.0000 mg | ORAL_CAPSULE | Freq: Every day | ORAL | 0 refills | Status: DC

## 2018-07-26 MED ORDER — DULOXETINE HCL 30 MG PO CSDR: DELAYED_RELEASE_CAPSULE | ORAL | 0 refills | Status: DC

## 2018-07-26 MED ORDER — TRAZODONE HCL 50 MG PO TABS: 50.0000 mg | ORAL_TABLET | Freq: Every evening | ORAL | 0 refills | Status: DC | PRN

## 2018-07-26 NOTE — Patient Instructions (Signed)
Plan:  1. Taper amitriptyline off: take 1/2 tablet you have now (150 mg) fior one week, than 1/4 of this tablet for another week then stop.  2. Start duloxetine (Cymbalta) 30 mg daily around 6 PM with food. After one week start taking two capsules for a total dose of 60 mg.  3. Trial of extended release alprazolam - take one 3 mg tablet in AM.  4. Trazodone 50 mg at bedtime as needed for sleep.
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
## 2018-07-28 ENCOUNTER — Ambulatory Visit: Payer: 59 | Admitting: "Endocrinology

## 2018-08-08 ENCOUNTER — Telehealth (HOSPITAL_COMMUNITY): Payer: Self-pay

## 2018-08-08 ENCOUNTER — Other Ambulatory Visit (HOSPITAL_COMMUNITY): Payer: Self-pay | Admitting: Psychiatry

## 2018-08-08 MED ORDER — TRAZODONE HCL 50 MG PO TABS: 75.0000 mg | ORAL_TABLET | Freq: Every evening | ORAL | 1 refills | Status: DC | PRN

## 2018-08-08 NOTE — Telephone Encounter (Signed)
Patient is calling to let you know that she increased her Trazodone to 75 mg and that seems to be working. Patient would like a refill for the 75 mg. Please review and advise, thank you

## 2018-08-08 NOTE — Telephone Encounter (Signed)
I gave her 1 month Rx for 75 mg nightly plus one refill today.

## 2018-08-09 NOTE — Telephone Encounter (Signed)
I called patient and let her know that there was a new prescription at the pharmacy

## 2018-08-10 ENCOUNTER — Other Ambulatory Visit: Payer: Self-pay | Admitting: "Endocrinology

## 2018-08-10 DIAGNOSIS — G894 Chronic pain syndrome: Secondary | ICD-10-CM | POA: Diagnosis not present

## 2018-08-10 DIAGNOSIS — G8929 Other chronic pain: Secondary | ICD-10-CM | POA: Diagnosis not present

## 2018-08-10 DIAGNOSIS — M545 Low back pain: Secondary | ICD-10-CM | POA: Diagnosis not present

## 2018-08-11 ENCOUNTER — Ambulatory Visit: Payer: 59 | Admitting: "Endocrinology

## 2018-08-11 ENCOUNTER — Telehealth: Payer: Self-pay | Admitting: "Endocrinology

## 2018-08-11 NOTE — Telephone Encounter (Signed)
Pt is requesting levothyroxine to be sent to pharmacy. She has had labs. TSH is 146.19 Free T4 0.3 on 07-15-2018.   Pt had to reschedule on 07-28-2018 due to weather conditions. She has not been in office since 05/20/17.

## 2018-08-11 NOTE — Telephone Encounter (Signed)
If she still with around 180 pounds, levothyroxine 137 should still be a good dose.  She has to take it every day.  Send 1 month supply, patient has to come for a visit.

## 2018-08-11 NOTE — Telephone Encounter (Signed)
rx sent

## 2018-08-23 ENCOUNTER — Ambulatory Visit (INDEPENDENT_AMBULATORY_CARE_PROVIDER_SITE_OTHER): Payer: 59 | Admitting: Psychiatry

## 2018-08-23 ENCOUNTER — Encounter (HOSPITAL_COMMUNITY): Payer: Self-pay | Admitting: Psychiatry

## 2018-08-23 VITALS — BP 107/74 | HR 88 | Ht 63.0 in | Wt 178.0 lb

## 2018-08-23 DIAGNOSIS — F3341 Major depressive disorder, recurrent, in partial remission: Secondary | ICD-10-CM

## 2018-08-23 DIAGNOSIS — F330 Major depressive disorder, recurrent, mild: Secondary | ICD-10-CM | POA: Diagnosis not present

## 2018-08-23 DIAGNOSIS — F320 Major depressive disorder, single episode, mild: Secondary | ICD-10-CM

## 2018-08-23 DIAGNOSIS — F411 Generalized anxiety disorder: Secondary | ICD-10-CM

## 2018-08-23 DIAGNOSIS — F410 Panic disorder [episodic paroxysmal anxiety] without agoraphobia: Secondary | ICD-10-CM | POA: Diagnosis not present

## 2018-08-23 DIAGNOSIS — F325 Major depressive disorder, single episode, in full remission: Secondary | ICD-10-CM | POA: Insufficient documentation

## 2018-08-23 HISTORY — DX: Major depressive disorder, recurrent, in partial remission: F33.41

## 2018-08-23 MED ORDER — PROMETHAZINE HCL 25 MG PO TABS: 25.0000 mg | ORAL_TABLET | Freq: Four times a day (QID) | ORAL | 0 refills | Status: DC | PRN

## 2018-08-23 MED ORDER — DICYCLOMINE HCL 20 MG PO TABS: 20.0000 mg | ORAL_TABLET | Freq: Three times a day (TID) | ORAL | 0 refills | Status: DC | PRN

## 2018-08-23 MED ORDER — CLONAZEPAM 1 MG PO TABS: 1.0000 mg | ORAL_TABLET | Freq: Three times a day (TID) | ORAL | 0 refills | Status: DC | PRN

## 2018-08-23 MED ORDER — DULOXETINE HCL 60 MG PO CPEP: 60.0000 mg | ORAL_CAPSULE | Freq: Every day | ORAL | 0 refills | Status: DC

## 2018-08-23 NOTE — Progress Notes (Signed)
[facility] MD/PA/NP OP Progress Note  08/23/2018 2:54 PM Vanessa Bowen  MRN:  [id number]  Chief Complaint: ANXIETY, NAUSEA, DIARRHEA. HPI: 43 yo married female with panic disorder and GAD (also MDD) who comes for her first follow up visit. Unfortunately it is difficult to judge how well emotionally she has done recently because of her having opioid withdrawal symptoms for the past few days. She went to a pain specialist who did not want to prescribe her any schedule II opioids and instead placed her on Belbuca (buccal buprenorphine). It did not provide any benefit in terms of chronic back pain control while causing dizziness and Vanessa Bowen stopped using it last week. She has since developed nausea/vomiting, stomach cramps, diarrhea. She contacted her provider but they refuse to change prescription. She thus decided to find a new pain specialist. She has been placed on duloxetine by me and tolerates it well enough. Xanax ER did not cover more than 5-6 hours - her anxiety was typically increasing by 2-3 PM each day. Sleep is adequate with 75 mg of trazodone. No SI, no hopelessness but Vanessa Bowen stated "I don't know at this time if Iam depressed or not". In the past she was on up to 120 mg of duloxetine but given her withdrawal sx it is not the best time to increase the dose. Visit Diagnosis:    ICD-10-CM   1. Panic disorder F41.0   2. GAD (generalized anxiety disorder) F41.1   3. Mild episode of recurrent major depressive disorder (HCC) F33.0     Past Psychiatric History: please refer to original H&P  Past Medical History:  Past Medical History:  Diagnosis Date  . Anxiety   . Arthritis   . Chronic back pain   . Colitis, ulcerative ([place])   . Depression   . Elevated blood pressure, situational   . Hypothyroidism   . Migraine     Past Surgical History:  Procedure Laterality Date  . ANKLE SURGERY    . BREAST ENHANCEMENT SURGERY    . CESAREAN SECTION    . KNEE SURGERY    . LAPAROSCOPIC BILATERAL SALPINGECTOMY   07/27/2012   Procedure: LAPAROSCOPIC BILATERAL SALPINGECTOMY;  Surgeon: Florian Buff, MD;  Location: [facility];  Service: Gynecology;  Laterality: N/A;  . OOPHORECTOMY Right 09/09/2016   Procedure: RIGHT OOPHORECTOMY;  Surgeon: Florian Buff, MD;  Location: [facility];  Service: Gynecology;  Laterality: Right;  . OVARIAN CYST REMOVAL    . VAGINAL HYSTERECTOMY N/A 09/09/2016   Procedure: HYSTERECTOMY VAGINAL;  Surgeon: Florian Buff, MD;  Location: [facility];  Service: Gynecology;  Laterality: N/A;    Family Psychiatric History: reviewed  Family History:  Family History  Problem Relation Age of Onset  . Cancer Maternal Grandmother   . Cancer Maternal Grandfather   . Alcohol abuse Maternal Grandfather   . Hypothyroidism Daughter   . Bipolar disorder Daughter   . Anxiety disorder Daughter   . Depression Mother   . Alcohol abuse Mother   . Drug abuse Sister   . Anxiety disorder Sister     Social History:  Social History   Socioeconomic History  . Marital status: Married    Spouse name: Not on file  . Number of children: 2  . Years of education: Not on file  . Highest education level: Not on file  Occupational History  . Not on file  Social Needs  . Financial resource strain: Not on file  . Food insecurity:    Worry: Not on  file    Inability: Not on file  . Transportation needs:    Medical: Not on file    Non-medical: Not on file  Tobacco Use  . Smoking status: Current Every Day Smoker    Packs/day: 0.50    Years: 15.00    Pack years: 7.50    Types: Cigarettes  . Smokeless tobacco: Never Used  Substance and Sexual Activity  . Alcohol use: Yes    Comment: Occasionally  . Drug use: No  . Sexual activity: Yes    Birth control/protection: Surgical    Comment: hyst  Lifestyle  . Physical activity:    Days per week: Not on file    Minutes per session: Not on file  . Stress: Not on file  Relationships  . Social connections:    Talks on phone: Not on file    Gets together:  Not on file    Attends religious service: Not on file    Active member of club or organization: Not on file    Attends meetings of clubs or organizations: Not on file    Relationship status: Not on file  Other Topics Concern  . Not on file  Social History Narrative  . Not on file    Allergies:  Allergies  Allergen Reactions  . Doxycycline Nausea And Vomiting  . Sulfonamide Derivatives Nausea And Vomiting    Metabolic Disorder Labs: Lab Results  Component Value Date   HGBA1C 5.2 04/27/2017   MPG 103 04/27/2017   MPG 100 07/21/2016   No results found for: PROLACTIN No results found for: CHOL, TRIG, HDL, CHOLHDL, VLDL, LDLCALC Lab Results  Component Value Date   TSH 146.19 (H) 07/15/2018   TSH 100.97 (H) 04/27/2017    Therapeutic Level Labs: No results found for: LITHIUM No results found for: VALPROATE No components found for:  CBMZ  Current Medications: Current Outpatient Medications  Medication Sig Dispense Refill  . ALPRAZolam (XANAX XR) 3 MG 24 hr tablet Take 1 tablet (3 mg total) by mouth every morning for 30 days. 30 tablet 0  . amitriptyline (ELAVIL) 150 MG tablet TAKE ONE TABLET BY MOUTH ONCE DAILY AT BEDTIME  1  . conjugated estrogens (PREMARIN) vaginal cream Use 1 gram nightly 30 g 12  . DULoxetine (CYMBALTA) 60 MG capsule Take 1 capsule (60 mg total) by mouth daily. Take after 7 days of 30 mg 30 capsule 0  . DULoxetine HCl 30 MG CSDR Take 30 capsules by mouth daily for 7 days, THEN 60 capsules daily for 23 days. 53 capsule 0  . Estradiol (DIVIGEL) 1 MG/GM GEL Place 1 packet onto the skin daily. 30 g 11  . furosemide (LASIX) 20 MG tablet Take 20 mg daily as needed by mouth for fluid.  2  . HYDROcodone-acetaminophen (NORCO) 7.5-325 MG tablet Take 1 tablet by mouth every 6 (six) hours as needed for moderate pain (Must last 30 days.). 70 tablet 0  . ibuprofen (ADVIL,MOTRIN) 200 MG tablet Take 400-600 mg by mouth every 8 (eight) hours as needed (for  pain/headaches.).    Marland Kitchen levothyroxine (SYNTHROID, LEVOTHROID) 137 MCG tablet TAKE 1 TABLET (137 MCG TOTAL) BY MOUTH DAILY BEFORE BREAKFAST. 30 tablet 0  . Melatonin 5 MG TABS Take 5 mg by mouth at bedtime as needed (for sleep.).    Marland Kitchen tiZANidine (ZANAFLEX) 4 MG tablet Take 4-8 mg by mouth See admin instructions. 4 mg daily as needed for muscle spasms, tension, or mild anxiety & 4-8 mg at bedtime  as needed for sleep/anxiety/spasms.    . traZODone (DESYREL) 50 MG tablet Take 1.5 tablets (75 mg total) by mouth at bedtime as needed for sleep. 45 tablet 1   No current facility-administered medications for this visit.      Musculoskeletal: Strength & Muscle Tone: within normal limits Gait & Station: normal Patient leans: N/A  Psychiatric Specialty Exam: Review of Systems  Constitutional: Positive for malaise/fatigue and weight loss.  HENT: Negative.   Eyes: Negative.   Respiratory: Negative.   Cardiovascular: Negative.   Gastrointestinal: Positive for diarrhea, nausea and vomiting.  Genitourinary: Negative.   Musculoskeletal: Positive for back pain and myalgias.  Skin: Negative.   Neurological: Positive for dizziness.  Endo/Heme/Allergies: Negative.   Psychiatric/Behavioral: The patient is nervous/anxious.     Last menstrual period 07/24/2016.There is no height or weight on file to calculate BMI.  General Appearance: Fairly Groomed  Eye Contact:  Fair  Speech:  Clear and Coherent and Slow  Volume:  Normal  Mood:  Anxious  Affect:  Congruent and Constricted  Thought Process:  Descriptions of Associations: Circumstantial  Orientation:  Full (Time, Place, and Person)  Thought Content: Logical   Suicidal Thoughts:  No  Homicidal Thoughts:  No  Memory:  Immediate;   Fair Recent;   Good Remote;   Good  Judgement:  Intact  Insight:  Fair  Psychomotor Activity:  Decreased  Concentration:  Concentration: Fair  Recall:  Good  Fund of Knowledge: Good  Language: Good  Akathisia:   Negative  Handed:  Right  AIMS (if indicated): not done  Assets:  Communication Skills Desire for Improvement Financial Resources/Insurance Housing Resilience Vocational/Educational  ADL's:  Intact  Cognition: WNL  Sleep:  Good   Screenings: PHQ2-9     Office Visit from 03/18/2016 in [facility]  PHQ-2 Total Score  0       Assessment and Plan: 43 yo married female with panic disorder, GAD and MDD who returns for her first follow up visit. She has been having opioid withdrawal symptoms for the past few days. She went to a pain specialist who did not want to prescribe her any schedule II opioids and instead placed her on Belbuca (buccal buprenorphine). It did not provide any benefit in terms of chronic back pain control while causing dizziness and Mai stopped using it last week. She has since developed nausea/vomiting, stomach cramps, diarrhea. She contacted her provider but they refuse to change prescription so she plans to find a new pain specialist. She has been placed on duloxetine by me and tolerates it well enough. Xanax ER did not cover more than 5-6 hours - her anxiety was typically increasing by 2-3 PM each day. Sleep is adequate with 75 mg of trazodone.  Dx : Panic disorder; GAD; MDD recurrent; Opioid withdrawal syndrome  Plan: DC Xanax XR and restart clonazepam 1 mg tid prn anxiety; continue duloxetine 60 mg daily and trazodone 75 mg at HS for sleep; I will give her Rx for Phenergan 25 mg PO qid prn nausea/vomiting and Bentyl 20 mg tid prn stomach cramps. She will return to clinic in 4 weeks and hopefully at that time we will be able to decide on further duloxetine dose adjustment (depending on her mood).    Stephanie Acre, MD 08/23/2018, 2:54 PM

## 2018-08-25 ENCOUNTER — Encounter: Payer: Self-pay | Admitting: "Endocrinology

## 2018-08-25 ENCOUNTER — Ambulatory Visit (INDEPENDENT_AMBULATORY_CARE_PROVIDER_SITE_OTHER): Payer: 59 | Admitting: "Endocrinology

## 2018-08-25 VITALS — BP 106/72 | HR 89 | Ht 63.0 in | Wt 178.0 lb

## 2018-08-25 DIAGNOSIS — E559 Vitamin D deficiency, unspecified: Secondary | ICD-10-CM

## 2018-08-25 DIAGNOSIS — E038 Other specified hypothyroidism: Secondary | ICD-10-CM

## 2018-08-25 MED ORDER — VITAMIN D (ERGOCALCIFEROL) 1.25 MG (50000 UNIT) PO CAPS: 50000.0000 [IU] | ORAL_CAPSULE | ORAL | 0 refills | Status: DC

## 2018-08-25 MED ORDER — LEVOTHYROXINE SODIUM 137 MCG PO TABS: 137.0000 ug | ORAL_TABLET | Freq: Every day | ORAL | 2 refills | Status: DC

## 2018-08-25 NOTE — Progress Notes (Signed)
Endocrinology follow-up note   Subjective:    Patient ID: Vanessa Bowen, female    DOB: 23-Sep-1975, PCP Sharilyn Sites, MD   Past Medical History:  Diagnosis Date  . Anxiety   . Arthritis   . Chronic back pain   . Colitis, ulcerative ([place])   . Depression   . Elevated blood pressure, situational   . Hypothyroidism   . Migraine    Past Surgical History:  Procedure Laterality Date  . ANKLE SURGERY    . BREAST ENHANCEMENT SURGERY    . CESAREAN SECTION    . KNEE SURGERY    . LAPAROSCOPIC BILATERAL SALPINGECTOMY  07/27/2012   Procedure: LAPAROSCOPIC BILATERAL SALPINGECTOMY;  Surgeon: Florian Buff, MD;  Location: [facility];  Service: Gynecology;  Laterality: N/A;  . OOPHORECTOMY Right 09/09/2016   Procedure: RIGHT OOPHORECTOMY;  Surgeon: Florian Buff, MD;  Location: [facility];  Service: Gynecology;  Laterality: Right;  . OVARIAN CYST REMOVAL    . VAGINAL HYSTERECTOMY N/A 09/09/2016   Procedure: HYSTERECTOMY VAGINAL;  Surgeon: Florian Buff, MD;  Location: [facility];  Service: Gynecology;  Laterality: N/A;   Social History   Socioeconomic History  . Marital status: Married    Spouse name: Not on file  . Number of children: 2  . Years of education: Not on file  . Highest education level: Not on file  Occupational History  . Not on file  Social Needs  . Financial resource strain: Not on file  . Food insecurity:    Worry: Not on file    Inability: Not on file  . Transportation needs:    Medical: Not on file    Non-medical: Not on file  Tobacco Use  . Smoking status: Current Every Day Smoker    Packs/day: 0.50    Years: 15.00    Pack years: 7.50    Types: Cigarettes  . Smokeless tobacco: Never Used  Substance and Sexual Activity  . Alcohol use: Yes    Comment: Occasionally  . Drug use: No  . Sexual activity: Yes    Birth control/protection: Surgical    Comment: hyst  Lifestyle  . Physical activity:    Days per week: Not on file    Minutes per session: Not on file  .  Stress: Not on file  Relationships  . Social connections:    Talks on phone: Not on file    Gets together: Not on file    Attends religious service: Not on file    Active member of club or organization: Not on file    Attends meetings of clubs or organizations: Not on file    Relationship status: Not on file  Other Topics Concern  . Not on file  Social History Narrative  . Not on file   Outpatient Encounter Medications as of 08/25/2018  Medication Sig  . clonazePAM (KLONOPIN) 1 MG tablet Take 1 tablet (1 mg total) by mouth 3 (three) times daily as needed for anxiety.  . conjugated estrogens (PREMARIN) vaginal cream Use 1 gram nightly  . dicyclomine (BENTYL) 20 MG tablet Take 1 tablet (20 mg total) by mouth 3 (three) times daily as needed for up to 30 days for spasms.  . DULoxetine (CYMBALTA) 60 MG capsule Take 1 capsule (60 mg total) by mouth daily.  . Estradiol (DIVIGEL) 1 MG/GM GEL Place 1 packet onto the skin daily.  . furosemide (LASIX) 20 MG tablet Take 20 mg daily as needed by mouth for fluid.  Marland Kitchen  ibuprofen (ADVIL,MOTRIN) 200 MG tablet Take 400-600 mg by mouth every 8 (eight) hours as needed (for pain/headaches.).  Marland Kitchen levothyroxine (SYNTHROID, LEVOTHROID) 137 MCG tablet Take 1 tablet (137 mcg total) by mouth daily before breakfast.  . promethazine (PHENERGAN) 25 MG tablet Take 1 tablet (25 mg total) by mouth every 6 (six) hours as needed for up to 30 days for nausea or vomiting.  Marland Kitchen tiZANidine (ZANAFLEX) 4 MG tablet Take 4-8 mg by mouth See admin instructions. 4 mg daily as needed for muscle spasms, tension, or mild anxiety & 4-8 mg at bedtime as needed for sleep/anxiety/spasms.  . traZODone (DESYREL) 50 MG tablet Take 1.5 tablets (75 mg total) by mouth at bedtime as needed for sleep.  . Vitamin D, Ergocalciferol, (DRISDOL) 1.25 MG (50000 UT) CAPS capsule Take 1 capsule (50,000 Units total) by mouth every 7 (seven) days.  . [DISCONTINUED] HYDROcodone-acetaminophen (NORCO) 7.5-325 MG  tablet Take 1 tablet by mouth every 6 (six) hours as needed for moderate pain (Must last 30 days.). (Patient not taking: Reported on 08/25/2018)  . [DISCONTINUED] levothyroxine (SYNTHROID, LEVOTHROID) 137 MCG tablet TAKE 1 TABLET (137 MCG TOTAL) BY MOUTH DAILY BEFORE BREAKFAST.  . [DISCONTINUED] Melatonin 5 MG TABS Take 5 mg by mouth at bedtime as needed (for sleep.).   No facility-administered encounter medications on file as of 08/25/2018.    ALLERGIES: Allergies  Allergen Reactions  . Doxycycline Nausea And Vomiting  . Sulfonamide Derivatives Nausea And Vomiting   VACCINATION STATUS:  There is no immunization history on file for this patient.  HPI  43 yr old female with medical hx as follows.  She was last seen in November 2018, missed her last several appointments.  She has hypothyroidism diagnosed at approximate age of 38 years.  She admits to inconsistency in her thyroid hormone intake.  She was supposed to be on levothyroxine 137 mcg p.o. every morning.  Her recent labs from January 2020 were significant for high TSH and low free T4.   -She reports fatigue, no weight change, no constipation.    Review of Systems Constitutional: + Fluctuating body weight,   +fatigue, no subjective hypothermia.   Eyes: no blurry vision, no xerophthalmia ENT: no sore throat, no nodules palpated in throat, no dysphagia/odynophagia, no hoarseness Cardiovascular: no chest pain, no shortness of breath, no palpitations.   Respiratory: no cough/SOB Gastrointestinal: no nausea, vomiting, diarrhea.   Musculoskeletal: no muscle/joint aches Skin: no rashes Neurological: no tremors/numbness/tingling/dizziness Psychiatric: +depression  Objective:    BP 106/72   Pulse 89   Ht 5' 3"  (1.6 m)   Wt 178 lb (80.7 kg)   LMP 07/24/2016   BMI 31.53 kg/m   Wt Readings from Last 3 Encounters:  08/25/18 178 lb (80.7 kg)  07/06/18 190 lb (86.2 kg)  06/06/18 182 lb (82.6 kg)    Physical Exam  Constitutional:  + Obese for height, not in acute distress.   Eyyes: PERRLA, EOMI, no exophthalmos ENT: moist mucous membranes, no thyromegaly, no cervical lymphadenopathy  Musculoskeletal: no deformities, strength intact in all 4 Skin: moist, warm, no rashes Neurological: no tremor with outstretched hands  CMP     Component Value Date/Time   NA 135 03/12/2018 1343   K 4.3 03/12/2018 1343   CL 102 03/12/2018 1343   CO2 20 (L) 03/12/2018 1343   GLUCOSE 103 (H) 03/12/2018 1343   BUN 18 03/12/2018 1343   CREATININE 0.89 03/12/2018 1343   CREATININE 1.05 04/27/2017 1226   CALCIUM 9.8 03/12/2018 1343  PROT 8.0 03/12/2018 1343   ALBUMIN 4.4 03/12/2018 1343   AST 18 03/12/2018 1343   ALT 21 03/12/2018 1343   ALKPHOS 78 03/12/2018 1343   BILITOT 0.4 03/12/2018 1343   GFRNONAA >60 03/12/2018 1343   GFRAA >60 03/12/2018 1343   Recent Results (from the past 2160 hour(s))  TSH     Status: Abnormal   Collection Time: 07/15/18 12:12 PM  Result Value Ref Range   TSH 146.19 (H) mIU/L    Comment:           Reference Range .           > or = 20 Years  0.40-4.50 .                Pregnancy Ranges           First trimester    0.26-2.66           Second trimester   0.55-2.73           Third trimester    0.43-2.91   T4, Free     Status: Abnormal   Collection Time: 07/15/18 12:12 PM  Result Value Ref Range   Free T4 0.3 (L) 0.8 - 1.8 ng/dL    Assessment & Plan:   1. hypothyroidism -Patient has difficulty maintaining follow-ups and recommendations.  Her previsit labs show evidence of inconsistency in taking her thyroid hormone.   -I had a long discussion with her about the absolute need for daily intake of thyroid hormone.  Her current dose of levothyroxine 137 mcg p.o. every morning should be more than adequate dose for her current body size.    -She is advised to continue  levothyroxine 137 mcg po qam.    - We discussed about the correct intake of her thyroid hormone, on empty stomach at fasting,  with water, separated by at least 30 minutes from breakfast and other medications,  and separated by more than 4 hours from calcium, iron, multivitamins, acid reflux medications (PPIs). -Patient is made aware of the fact that thyroid hormone replacement is needed for life, dose to be adjusted by periodic monitoring of thyroid function tests.  2.   vitamin D deficiency- -she is requesting a refill on her vitamin D, refill done for her.  - I advised patient to maintain close follow up with Sharilyn Sites, MD for primary care needs. Follow up plan: Return in about 6 weeks (around 10/06/2018) for Follow up with Pre-visit Labs.  Glade Lloyd, MD Phone: [phone]  Fax: [phone]  -  This note was partially dictated with voice recognition software. Similar sounding words can be transcribed inadequately or may not  be corrected upon review.  08/25/2018, 5:18 PM

## 2018-08-25 NOTE — Progress Notes (Signed)
Vanessa Bowen, CMA  

## 2018-08-30 ENCOUNTER — Ambulatory Visit (INDEPENDENT_AMBULATORY_CARE_PROVIDER_SITE_OTHER): Payer: 59 | Admitting: Orthopaedic Surgery

## 2018-08-30 ENCOUNTER — Encounter: Payer: Self-pay | Admitting: Orthopaedic Surgery

## 2018-08-30 VITALS — BP 132/85 | HR 76 | Ht 63.0 in | Wt 173.0 lb

## 2018-08-30 DIAGNOSIS — F17210 Nicotine dependence, cigarettes, uncomplicated: Secondary | ICD-10-CM

## 2018-08-30 DIAGNOSIS — G8929 Other chronic pain: Secondary | ICD-10-CM

## 2018-08-30 DIAGNOSIS — M5441 Lumbago with sciatica, right side: Secondary | ICD-10-CM | POA: Diagnosis not present

## 2018-08-30 NOTE — Progress Notes (Signed)
Patient XU:XYBFX Vanessa Bowen, female DOB:[birth year], 43 y.o. OVA:[id number]  Chief Complaint  Patient presents with  . Back Pain    Chronic back pain.    HPI  Vanessa Bowen is a 43 y.o. female who has chronic lower back pain. She went to the pain clinic but has been unable to take the medicine they gave her.  She has been sick and lost about 20 pounds.  She says she cannot afford PT.  I have no notes from the pain clinic and I need them.  She understands.  She has continued pain with some right sided paresthesias.  She has no new trauma, no weakness.   Body mass index is 30.65 kg/m.  ROS  Review of Systems  HENT: Negative for congestion.   Respiratory: Negative for cough and shortness of breath.   Cardiovascular: Negative for chest pain and leg swelling.  Endocrine: Positive for cold intolerance.  Musculoskeletal: Positive for arthralgias and back pain.  Allergic/Immunologic: Positive for environmental allergies.  Neurological: Positive for headaches.  Psychiatric/Behavioral: The patient is nervous/anxious.   All other systems reviewed and are negative.   All other systems reviewed and are negative.  The following is a summary of the past history medically, past history surgically, known current medicines, social history and family history.  This information is gathered electronically by the computer from prior information and documentation.  I review this each visit and have found including this information at this point in the chart is beneficial and informative.    Past Medical History:  Diagnosis Date  . Anxiety   . Arthritis   . Chronic back pain   . Colitis, ulcerative ([place])   . Depression   . Elevated blood pressure, situational   . Hypothyroidism   . Migraine     Past Surgical History:  Procedure Laterality Date  . ANKLE SURGERY    . BREAST ENHANCEMENT SURGERY    . CESAREAN SECTION    . KNEE SURGERY    . LAPAROSCOPIC BILATERAL SALPINGECTOMY  07/27/2012   Procedure:  LAPAROSCOPIC BILATERAL SALPINGECTOMY;  Surgeon: Vanessa Buff, MD;  Location: [facility];  Service: Gynecology;  Laterality: N/A;  . OOPHORECTOMY Right 09/09/2016   Procedure: RIGHT OOPHORECTOMY;  Surgeon: Vanessa Buff, MD;  Location: [facility];  Service: Gynecology;  Laterality: Right;  . OVARIAN CYST REMOVAL    . VAGINAL HYSTERECTOMY N/A 09/09/2016   Procedure: HYSTERECTOMY VAGINAL;  Surgeon: Vanessa Buff, MD;  Location: [facility];  Service: Gynecology;  Laterality: N/A;    Family History  Problem Relation Age of Onset  . Cancer Maternal Grandmother   . Cancer Maternal Grandfather   . Alcohol abuse Maternal Grandfather   . Hypothyroidism Daughter   . Bipolar disorder Daughter   . Anxiety disorder Daughter   . Depression Mother   . Alcohol abuse Mother   . Drug abuse Sister   . Anxiety disorder Sister     Social History Social History   Tobacco Use  . Smoking status: Current Every Day Smoker    Packs/day: 0.50    Years: 15.00    Pack years: 7.50    Types: Cigarettes  . Smokeless tobacco: Never Used  Substance Use Topics  . Alcohol use: Yes    Comment: Occasionally  . Drug use: No    Allergies  Allergen Reactions  . Doxycycline Nausea And Vomiting  . Sulfonamide Derivatives Nausea And Vomiting    Current Outpatient Medications  Medication Sig Dispense Refill  . clonazePAM (KLONOPIN)  1 MG tablet Take 1 tablet (1 mg total) by mouth 3 (three) times daily as needed for anxiety. 90 tablet 0  . conjugated estrogens (PREMARIN) vaginal cream Use 1 gram nightly 30 g 12  . dicyclomine (BENTYL) 20 MG tablet Take 1 tablet (20 mg total) by mouth 3 (three) times daily as needed for up to 30 days for spasms. 90 tablet 0  . DULoxetine (CYMBALTA) 60 MG capsule Take 1 capsule (60 mg total) by mouth daily. 30 capsule 0  . Estradiol (DIVIGEL) 1 MG/GM GEL Place 1 packet onto the skin daily. 30 g 11  . furosemide (LASIX) 20 MG tablet Take 20 mg daily as needed by mouth for fluid.  2  . ibuprofen  (ADVIL,MOTRIN) 200 MG tablet Take 400-600 mg by mouth every 8 (eight) hours as needed (for pain/headaches.).    Marland Kitchen levothyroxine (SYNTHROID, LEVOTHROID) 137 MCG tablet Take 1 tablet (137 mcg total) by mouth daily before breakfast. 30 tablet 2  . promethazine (PHENERGAN) 25 MG tablet Take 1 tablet (25 mg total) by mouth every 6 (six) hours as needed for up to 30 days for nausea or vomiting. 90 tablet 0  . tiZANidine (ZANAFLEX) 4 MG tablet Take 4-8 mg by mouth See admin instructions. 4 mg daily as needed for muscle spasms, tension, or mild anxiety & 4-8 mg at bedtime as needed for sleep/anxiety/spasms.    . traZODone (DESYREL) 50 MG tablet Take 1.5 tablets (75 mg total) by mouth at bedtime as needed for sleep. 45 tablet 1  . Vitamin D, Ergocalciferol, (DRISDOL) 1.25 MG (50000 UT) CAPS capsule Take 1 capsule (50,000 Units total) by mouth every 7 (seven) days. 12 capsule 0   No current facility-administered medications for this visit.      Physical Exam  Blood pressure 132/85, pulse 76, height 5' 3"  (1.6 m), weight 173 lb (78.5 kg), last menstrual period 07/24/2016.  Constitutional: overall normal hygiene, normal nutrition, well developed, normal grooming, normal body habitus. Assistive device:none  Musculoskeletal: gait and station Limp none, muscle tone and strength are normal, no tremors or atrophy is present.  .  Neurological: coordination overall normal.  Deep tendon reflex/nerve stretch intact.  Sensation normal.  Cranial nerves II-XII intact.   Skin:   Normal overall no scars, lesions, ulcers or rashes. No psoriasis.  Psychiatric: Alert and oriented x 3.  Recent memory intact, remote memory unclear.  Normal mood and affect. Well groomed.  Good eye contact.  Cardiovascular: overall no swelling, no varicosities, no edema bilaterally, normal temperatures of the legs and arms, no clubbing, cyanosis and good capillary refill.  Lymphatic: palpation is normal.  Spine/Pelvis  examination:  Inspection:  Overall, sacoiliac joint benign and hips nontender; without crepitus or defects.   Thoracic spine inspection: Alignment normal without kyphosis present   Lumbar spine inspection:  Alignment  with normal lumbar lordosis, without scoliosis apparent.   Thoracic spine palpation:  without tenderness of spinal processes   Lumbar spine palpation: without tenderness of lumbar area; without tightness of lumbar muscles    Range of Motion:   Lumbar flexion, forward flexion is normal without pain or tenderness    Lumbar extension is full without pain or tenderness   Left lateral bend is normal without pain or tenderness   Right lateral bend is normal without pain or tenderness   Straight leg raising is normal  Strength & tone: normal   Stability overall normal stability  All other systems reviewed and are negative   The patient  has been educated about the nature of the problem(s) and counseled on treatment options.  The patient appeared to understand what I have discussed and is in agreement with it.  Encounter Diagnoses  Name Primary?  . Chronic right-sided low back pain with right-sided sciatica Yes  . Cigarette nicotine dependence without complication     PLAN Call if any problems.  Precautions discussed.  Continue current medications.   Return to clinic 1 month   Get the information from the pain clinic.  Electronically Signed Sanjuana Kava, MD 3/10/20202:07 PM

## 2018-09-01 ENCOUNTER — Telehealth: Payer: Self-pay | Admitting: Orthopaedic Surgery

## 2018-09-01 NOTE — Telephone Encounter (Signed)
Called and spoke with Jeanella Anton NP at [facility] in reference to conversation with Dr. Luna Glasgow. I reiterated that patient had told him that she wasn't taking the medication because it wasn't doing any good for her pain. Patient also stated to him that she didn't want to go back to the pain clinic. Angus Palms stated that patient was wanting high dose of pain medication and she was not going to give that. Janelle also stated that she didn't know of any pain clinic that would give that. I informed Dr. Luna Glasgow of this information.

## 2018-09-05 ENCOUNTER — Ambulatory Visit: Payer: 59 | Admitting: Obstetrics & Gynecology

## 2018-09-06 ENCOUNTER — Ambulatory Visit: Payer: Self-pay | Admitting: Obstetrics & Gynecology

## 2018-09-07 ENCOUNTER — Other Ambulatory Visit: Payer: Self-pay

## 2018-09-07 DIAGNOSIS — M545 Low back pain, unspecified: Secondary | ICD-10-CM

## 2018-09-07 DIAGNOSIS — G8929 Other chronic pain: Secondary | ICD-10-CM

## 2018-09-07 DIAGNOSIS — M5441 Lumbago with sciatica, right side: Secondary | ICD-10-CM | POA: Diagnosis not present

## 2018-09-08 LAB — DRUGS OF ABUSE SCREEN W/O ALC, ROUTINE URINE
AMPHETAMINES (1000 NG/ML SCRN): NEGATIVE
BARBITURATES: NEGATIVE
BENZODIAZEPINES: NEGATIVE
COCAINE METABOLITES: NEGATIVE
MARIJUANA MET (50 ng/mL SCRN): NEGATIVE
METHADONE: NEGATIVE
METHAQUALONE: NEGATIVE
OPIATES: NEGATIVE
PHENCYCLIDINE: NEGATIVE
PROPOXYPHENE: NEGATIVE

## 2018-09-12 ENCOUNTER — Telehealth: Payer: Self-pay | Admitting: Obstetrics & Gynecology

## 2018-09-12 NOTE — Telephone Encounter (Signed)
Agree totally

## 2018-09-12 NOTE — Telephone Encounter (Signed)
Patient called stating that she is doing well with the medication that Dr. Elonda Husky has placed her on. Pt states that she has enough refill and does not need to come to her appointment at this time until the Virus has dies down. Pt just wanted to let Dr. Elonda Husky know.

## 2018-09-13 ENCOUNTER — Ambulatory Visit: Payer: Self-pay | Admitting: Obstetrics & Gynecology

## 2018-09-13 ENCOUNTER — Telehealth: Payer: Self-pay | Admitting: Orthopaedic Surgery

## 2018-09-13 MED ORDER — HYDROCODONE-ACETAMINOPHEN 5-325 MG PO TABS: 1.0000 | ORAL_TABLET | Freq: Four times a day (QID) | ORAL | 0 refills | Status: DC | PRN

## 2018-09-13 NOTE — Telephone Encounter (Signed)
Patient is asking for pain medication.  PATIENT USES [facility] [place]

## 2018-09-22 ENCOUNTER — Ambulatory Visit (INDEPENDENT_AMBULATORY_CARE_PROVIDER_SITE_OTHER): Payer: 59 | Admitting: Psychiatry

## 2018-09-22 ENCOUNTER — Other Ambulatory Visit: Payer: Self-pay

## 2018-09-22 DIAGNOSIS — F411 Generalized anxiety disorder: Secondary | ICD-10-CM | POA: Diagnosis not present

## 2018-09-22 DIAGNOSIS — F410 Panic disorder [episodic paroxysmal anxiety] without agoraphobia: Secondary | ICD-10-CM | POA: Diagnosis not present

## 2018-09-22 DIAGNOSIS — F320 Major depressive disorder, single episode, mild: Secondary | ICD-10-CM

## 2018-09-22 MED ORDER — DULOXETINE HCL 60 MG PO CPEP: 60.0000 mg | ORAL_CAPSULE | Freq: Every day | ORAL | 2 refills | Status: DC

## 2018-09-22 MED ORDER — CLONAZEPAM 1 MG PO TABS: 1.0000 mg | ORAL_TABLET | Freq: Three times a day (TID) | ORAL | 2 refills | Status: DC | PRN

## 2018-09-22 MED ORDER — TRAZODONE HCL 100 MG PO TABS: 100.0000 mg | ORAL_TABLET | Freq: Every evening | ORAL | 2 refills | Status: DC | PRN

## 2018-09-22 NOTE — Progress Notes (Signed)
[place] MD/PA/NP OP Progress Note  09/22/2018 1:25 PM Vanessa Bowen  MRN:  [id number] I nterview was conducted using teleconferencing and I verified that I was speaking with the correct person using two identifiers. I discussed the limitations of evaluation and management by telemedicine and  the availability of in person appointments. Patient expressed understanding and agreed to proceed.  Chief Complaint:  Some anxiety.  HPI: 43 yo married female with panic disorder, GAD and MDD who returns for her first follow up visit. She has been more anxious and having more problems with sleep due to stress related to corona virus (first two cases in [facility]). She takes clonazepam daily and needed to start taking 100 mg of trazodone. Depression minimal, if  Any, on 60 mg of duloxetine. Pain also better controlled as she is back on hydrocodone (not from a pain clinic but prescribed by Dr. Luna Glasgow).   Visit Diagnosis:    ICD-10-CM   1. GAD (generalized anxiety disorder) F41.1   2. Panic disorder F41.0   3. Current mild episode of major depressive disorder without prior episode ([place]) F32.0     Past Psychiatric History: Please refer to intake H&P.  Past Medical History:  Past Medical History:  Diagnosis Date  . Anxiety   . Arthritis   . Chronic back pain   . Colitis, ulcerative ([place])   . Depression   . Elevated blood pressure, situational   . Hypothyroidism   . Migraine     Past Surgical History:  Procedure Laterality Date  . ANKLE SURGERY    . BREAST ENHANCEMENT SURGERY    . CESAREAN SECTION    . KNEE SURGERY    . LAPAROSCOPIC BILATERAL SALPINGECTOMY  07/27/2012   Procedure: LAPAROSCOPIC BILATERAL SALPINGECTOMY;  Surgeon: Florian Buff, MD;  Location: [facility];  Service: Gynecology;  Laterality: N/A;  . OOPHORECTOMY Right 09/09/2016   Procedure: RIGHT OOPHORECTOMY;  Surgeon: Florian Buff, MD;  Location: [facility];  Service: Gynecology;  Laterality: Right;  . OVARIAN CYST REMOVAL    . VAGINAL  HYSTERECTOMY N/A 09/09/2016   Procedure: HYSTERECTOMY VAGINAL;  Surgeon: Florian Buff, MD;  Location: [facility];  Service: Gynecology;  Laterality: N/A;    Family Psychiatric History: Reviewed  Family History:  Family History  Problem Relation Age of Onset  . Cancer Maternal Grandmother   . Cancer Maternal Grandfather   . Alcohol abuse Maternal Grandfather   . Hypothyroidism Daughter   . Bipolar disorder Daughter   . Anxiety disorder Daughter   . Depression Mother   . Alcohol abuse Mother   . Drug abuse Sister   . Anxiety disorder Sister     Social History:  Social History   Socioeconomic History  . Marital status: Married    Spouse name: Not on file  . Number of children: 2  . Years of education: Not on file  . Highest education level: Not on file  Occupational History  . Not on file  Social Needs  . Financial resource strain: Not on file  . Food insecurity:    Worry: Not on file    Inability: Not on file  . Transportation needs:    Medical: Not on file    Non-medical: Not on file  Tobacco Use  . Smoking status: Current Every Day Smoker    Packs/day: 0.50    Years: 15.00    Pack years: 7.50    Types: Cigarettes  . Smokeless tobacco: Never Used  Substance and Sexual Activity  .  Alcohol use: Yes    Comment: Occasionally  . Drug use: No  . Sexual activity: Yes    Birth control/protection: Surgical    Comment: hyst  Lifestyle  . Physical activity:    Days per week: Not on file    Minutes per session: Not on file  . Stress: Not on file  Relationships  . Social connections:    Talks on phone: Not on file    Gets together: Not on file    Attends religious service: Not on file    Active member of club or organization: Not on file    Attends meetings of clubs or organizations: Not on file    Relationship status: Not on file  Other Topics Concern  . Not on file  Social History Narrative  . Not on file    Allergies:  Allergies  Allergen Reactions  .  Doxycycline Nausea And Vomiting  . Sulfonamide Derivatives Nausea And Vomiting    Metabolic Disorder Labs: Lab Results  Component Value Date   HGBA1C 5.2 04/27/2017   MPG 103 04/27/2017   MPG 100 07/21/2016   No results found for: PROLACTIN No results found for: CHOL, TRIG, HDL, CHOLHDL, VLDL, LDLCALC Lab Results  Component Value Date   TSH 146.19 (H) 07/15/2018   TSH 100.97 (H) 04/27/2017    Therapeutic Level Labs: No results found for: LITHIUM No results found for: VALPROATE No components found for:  CBMZ  Current Medications: Current Outpatient Medications  Medication Sig Dispense Refill  . clonazePAM (KLONOPIN) 1 MG tablet Take 1 tablet (1 mg total) by mouth 3 (three) times daily as needed for anxiety. 90 tablet 0  . conjugated estrogens (PREMARIN) vaginal cream Use 1 gram nightly 30 g 12  . dicyclomine (BENTYL) 20 MG tablet Take 1 tablet (20 mg total) by mouth 3 (three) times daily as needed for up to 30 days for spasms. 90 tablet 0  . DULoxetine (CYMBALTA) 60 MG capsule Take 1 capsule (60 mg total) by mouth daily. 30 capsule 0  . Estradiol (DIVIGEL) 1 MG/GM GEL Place 1 packet onto the skin daily. 30 g 11  . furosemide (LASIX) 20 MG tablet Take 20 mg daily as needed by mouth for fluid.  2  . HYDROcodone-acetaminophen (NORCO/VICODIN) 5-325 MG tablet Take 1 tablet by mouth every 6 (six) hours as needed for up to 30 days for moderate pain (Must last 30 days.). 60 tablet 0  . ibuprofen (ADVIL,MOTRIN) 200 MG tablet Take 400-600 mg by mouth every 8 (eight) hours as needed (for pain/headaches.).    Marland Kitchen levothyroxine (SYNTHROID, LEVOTHROID) 137 MCG tablet Take 1 tablet (137 mcg total) by mouth daily before breakfast. 30 tablet 2  . promethazine (PHENERGAN) 25 MG tablet Take 1 tablet (25 mg total) by mouth every 6 (six) hours as needed for up to 30 days for nausea or vomiting. 90 tablet 0  . tiZANidine (ZANAFLEX) 4 MG tablet Take 4-8 mg by mouth See admin instructions. 4 mg daily as  needed for muscle spasms, tension, or mild anxiety & 4-8 mg at bedtime as needed for sleep/anxiety/spasms.    . traZODone (DESYREL) 50 MG tablet Take 1.5 tablets (75 mg total) by mouth at bedtime as needed for sleep. 45 tablet 1  . Vitamin D, Ergocalciferol, (DRISDOL) 1.25 MG (50000 UT) CAPS capsule Take 1 capsule (50,000 Units total) by mouth every 7 (seven) days. 12 capsule 0   No current facility-administered medications for this visit.      Psychiatric  Specialty Exam: Review of Systems  Musculoskeletal: Positive for back pain.  Psychiatric/Behavioral: The patient is nervous/anxious and has insomnia.   All other systems reviewed and are negative.   Last menstrual period 07/24/2016.There is no height or weight on file to calculate BMI.  General Appearance: NA  Eye Contact:  NA  Speech:  Clear and Coherent  Volume:  Normal  Mood:  Anxious  Affect:  NA  Thought Process:  Goal Directed  Orientation:  Full (Time, Place, and Person)  Thought Content: Logical   Suicidal Thoughts:  No  Homicidal Thoughts:  No  Memory:  Immediate;   Good Recent;   Good Remote;   Good  Judgement:  Intact  Insight:  Good  Psychomotor Activity:  NA  Concentration:  Concentration: Good  Recall:  Good  Fund of Knowledge: Good  Language: Good  Akathisia:  NA  Handed:  Right  AIMS (if indicated): not done  Assets:  Communication Skills  ADL's:  Intact  Cognition: WNL  Sleep:  Fair   Screenings: PHQ2-9     Office Visit from 03/18/2016 in [facility]  PHQ-2 Total Score  0       Assessment and Plan: 43 yo married female with panic disorder, GAD and MDD who returns for her first follow up visit. She has been more anxious and having more problems with sleep due to stress related to corona virus (first two cases in [facility]). She takes clonazepam daily and needed to start taking 100 mg of trazodone. Depression minimal, if  Any, on 60 mg of duloxetine. Pain also better  controlled as she is back on hydrocodone/APAP.  Plan: Continue duloxetine 60 mg, clonazepam 1 mg tid prn anxiety and increase trazodone to 100 mg at HS prn insomnia. Next med management visit in 2 months.   Stephanie Acre, MD 09/22/2018, 1:25 PM

## 2018-09-27 ENCOUNTER — Ambulatory Visit: Payer: 59 | Admitting: Orthopaedic Surgery

## 2018-10-10 ENCOUNTER — Telehealth: Payer: Self-pay | Admitting: Orthopaedic Surgery

## 2018-10-10 MED ORDER — HYDROCODONE-ACETAMINOPHEN 5-325 MG PO TABS: 1.0000 | ORAL_TABLET | Freq: Four times a day (QID) | ORAL | 0 refills | Status: DC | PRN

## 2018-10-10 NOTE — Telephone Encounter (Signed)
Patient called and requested refill on Hydrocodone/Acetaminophen 5-325  Mgs.   Qty  60  Sig: Take 1 tablet by mouth every 6 (six) hours as needed for up to 30 days for moderate pain (Must last 30 days.).  Patient states she uses [facility] in [place]

## 2018-10-11 ENCOUNTER — Ambulatory Visit: Payer: 59 | Admitting: "Endocrinology

## 2018-10-11 DIAGNOSIS — E038 Other specified hypothyroidism: Secondary | ICD-10-CM | POA: Diagnosis not present

## 2018-10-11 DIAGNOSIS — E559 Vitamin D deficiency, unspecified: Secondary | ICD-10-CM | POA: Diagnosis not present

## 2018-10-12 LAB — VITAMIN D 25 HYDROXY (VIT D DEFICIENCY, FRACTURES): Vit D, 25-Hydroxy: 36 ng/mL (ref 30–100)

## 2018-10-12 LAB — TSH: TSH: 0.03 mIU/L — ABNORMAL LOW

## 2018-10-12 LAB — T4, FREE: FREE T4: 1.2 ng/dL (ref 0.8–1.8)

## 2018-10-17 ENCOUNTER — Telehealth (HOSPITAL_COMMUNITY): Payer: Self-pay

## 2018-10-17 ENCOUNTER — Other Ambulatory Visit (HOSPITAL_COMMUNITY): Payer: Self-pay | Admitting: Psychiatry

## 2018-10-17 MED ORDER — TRAZODONE HCL 150 MG PO TABS: 150.0000 mg | ORAL_TABLET | Freq: Every evening | ORAL | 2 refills | Status: DC | PRN

## 2018-10-17 NOTE — Telephone Encounter (Signed)
Patient is calling to see if you can increase the Trazodone, she said it is helping her go to sleep, but then she is waking up at 2-3 am. She thinks with a higher dose she can sleep a little longer. Please review and advise, thank you

## 2018-10-17 NOTE — Telephone Encounter (Signed)
I ordered her 150 mg strength now. She has 100s so she can take 1 1/2 until she runs out of current Rx.  OP

## 2018-10-18 ENCOUNTER — Encounter: Payer: Self-pay | Admitting: "Endocrinology

## 2018-10-18 ENCOUNTER — Ambulatory Visit (INDEPENDENT_AMBULATORY_CARE_PROVIDER_SITE_OTHER): Payer: 59 | Admitting: "Endocrinology

## 2018-10-18 ENCOUNTER — Other Ambulatory Visit: Payer: Self-pay

## 2018-10-18 DIAGNOSIS — E038 Other specified hypothyroidism: Secondary | ICD-10-CM

## 2018-10-18 DIAGNOSIS — E559 Vitamin D deficiency, unspecified: Secondary | ICD-10-CM | POA: Insufficient documentation

## 2018-10-18 MED ORDER — LEVOTHYROXINE SODIUM 137 MCG PO TABS: 137.0000 ug | ORAL_TABLET | Freq: Every day | ORAL | 6 refills | Status: DC

## 2018-10-18 NOTE — Telephone Encounter (Signed)
I called patient and let her know, she agreed with this plan

## 2018-10-18 NOTE — Progress Notes (Signed)
Endocrinology Telehealth Visit Follow up Note -During COVID -19 Pandemic  I connected with the patient on 10/18/2018   by telephone and verified that I am speaking with the correct person using two identifiers. Vanessa Bowen, April 26, 1976. she has verbally consented to this visit. All issues noted in this document were discussed and addressed. The format was not optimal for physical exam.    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 Anxiety   . Arthritis   . Chronic back pain   . Colitis, ulcerative ([place])   . Depression   . Elevated blood pressure, situational   . Hypothyroidism   . Migraine    Past Surgical History:  Procedure Laterality Date  . ANKLE SURGERY    . BREAST ENHANCEMENT SURGERY    . CESAREAN SECTION    . KNEE SURGERY    . LAPAROSCOPIC BILATERAL SALPINGECTOMY  07/27/2012   Procedure: LAPAROSCOPIC BILATERAL SALPINGECTOMY;  Surgeon: Florian Buff, MD;  Location: [facility];  Service: Gynecology;  Laterality: N/A;  . OOPHORECTOMY Right 09/09/2016   Procedure: RIGHT OOPHORECTOMY;  Surgeon: Florian Buff, MD;  Location: [facility];  Service: Gynecology;  Laterality: Right;  . OVARIAN CYST REMOVAL    . VAGINAL HYSTERECTOMY N/A 09/09/2016   Procedure: HYSTERECTOMY VAGINAL;  Surgeon: Florian Buff, MD;  Location: [facility];  Service: Gynecology;  Laterality: N/A;   Social History   Socioeconomic History  . Marital status: Married    Spouse name: Not on file  . Number of children: 2  . Years of education: Not on file  . Highest education level: Not on file  Occupational History  . Not on file  Social Needs  . Financial resource strain: Not on file  . Food insecurity:    Worry: Not on file    Inability: Not on file  . Transportation needs:    Medical: Not on file    Non-medical: Not on file  Tobacco Use  . Smoking status: Current Every Day Smoker   Packs/day: 0.50    Years: 15.00    Pack years: 7.50    Types: Cigarettes  . Smokeless tobacco: Never Used  Substance and Sexual Activity  . Alcohol use: Yes    Comment: Occasionally  . Drug use: No  . Sexual activity: Yes    Birth control/protection: Surgical    Comment: hyst  Lifestyle  . Physical activity:    Days per week: Not on file    Minutes per session: Not on file  . Stress: Not on file  Relationships  . Social connections:    Talks on phone: Not on file    Gets together: Not on file    Attends religious service: Not on file    Active member of club or organization: Not on file    Attends meetings of clubs or organizations: Not on file    Relationship status: Not on file  Other Topics Concern  . Not on file  Social History Narrative  . Not on file   Outpatient Encounter Medications as of 10/18/2018  Medication Sig  . clonazePAM (KLONOPIN) 1 MG tablet Take 1 tablet (1 mg total) by mouth 3 (three) times daily as  needed for anxiety.  . conjugated estrogens (PREMARIN) vaginal cream Use 1 gram nightly  . dicyclomine (BENTYL) 20 MG tablet Take 1 tablet (20 mg total) by mouth 3 (three) times daily as needed for up to 30 days for spasms.  . DULoxetine (CYMBALTA) 60 MG capsule Take 1 capsule (60 mg total) by mouth daily.  . Estradiol (DIVIGEL) 1 MG/GM GEL Place 1 packet onto the skin daily.  . furosemide (LASIX) 20 MG tablet Take 20 mg daily as needed by mouth for fluid.  Marland Kitchen HYDROcodone-acetaminophen (NORCO/VICODIN) 5-325 MG tablet Take 1 tablet by mouth every 6 (six) hours as needed for up to 30 days for moderate pain (Must last 30 days.).  Marland Kitchen ibuprofen (ADVIL,MOTRIN) 200 MG tablet Take 400-600 mg by mouth every 8 (eight) hours as needed (for pain/headaches.).  Marland Kitchen levothyroxine (SYNTHROID) 137 MCG tablet Take 1 tablet (137 mcg total) by mouth daily before breakfast.  . promethazine (PHENERGAN) 25 MG tablet Take 1 tablet (25 mg total) by mouth every 6 (six) hours as needed for up  to 30 days for nausea or vomiting.  Marland Kitchen tiZANidine (ZANAFLEX) 4 MG tablet Take 4-8 mg by mouth See admin instructions. 4 mg daily as needed for muscle spasms, tension, or mild anxiety & 4-8 mg at bedtime as needed for sleep/anxiety/spasms.  . traZODone (DESYREL) 150 MG tablet Take 1 tablet (150 mg total) by mouth at bedtime as needed for sleep.  . Vitamin D, Ergocalciferol, (DRISDOL) 1.25 MG (50000 UT) CAPS capsule Take 1 capsule (50,000 Units total) by mouth every 7 (seven) days.  . [DISCONTINUED] levothyroxine (SYNTHROID, LEVOTHROID) 137 MCG tablet Take 1 tablet (137 mcg total) by mouth daily before breakfast.   No facility-administered encounter medications on file as of 10/18/2018.    ALLERGIES: Allergies  Allergen Reactions  . Doxycycline Nausea And Vomiting  . Sulfonamide Derivatives Nausea And Vomiting   VACCINATION STATUS:  There is no immunization history on file for this patient.  HPI  43 yr old female with medical hx as follows.  She is being engaged in telehealth for follow-up of her longstanding hypothyroidism.  She has hypothyroidism diagnosed at approximate age of 84 years.  She is currently on levothyroxine 137 mcg p.o. every morning.  She has reports better consistency taking her medication at this time.  She has no new complaints today.   Review of Systems   Limited as above.  Objective:    LMP 07/24/2016   Wt Readings from Last 3 Encounters:  08/30/18 173 lb (78.5 kg)  08/25/18 178 lb (80.7 kg)  07/06/18 190 lb (86.2 kg)    Physical Exam    CMP     Component Value Date/Time   NA 135 03/12/2018 1343   K 4.3 03/12/2018 1343   CL 102 03/12/2018 1343   CO2 20 (L) 03/12/2018 1343   GLUCOSE 103 (H) 03/12/2018 1343   BUN 18 03/12/2018 1343   CREATININE 0.89 03/12/2018 1343   CREATININE 1.05 04/27/2017 1226   CALCIUM 9.8 03/12/2018 1343   PROT 8.0 03/12/2018 1343   ALBUMIN 4.4 03/12/2018 1343   AST 18 03/12/2018 1343   ALT 21 03/12/2018 1343   ALKPHOS  78 03/12/2018 1343   BILITOT 0.4 03/12/2018 1343   GFRNONAA >60 03/12/2018 1343   GFRAA >60 03/12/2018 1343   Recent Results (from the past 2160 hour(s))  Drugs of abuse screen w/o alc (for [facility] OP)     Status: None   Collection Time: 09/07/18  2:56 PM  Result Value Ref Range   Please note      Comment: .    * These results are for medical treatment only.  *    * Analysis was performed as non-forensic testing. * .    AMPHETAMINES (1000 ng/mL SCRN) NEGATIVE    BARBITURATES NEGATIVE    BENZODIAZEPINES NEGATIVE    COCAINE METABOLITES NEGATIVE    MARIJUANA MET (50 ng/mL SCRN) NEGATIVE    METHADONE NEGATIVE    METHAQUALONE NEGATIVE    OPIATES NEGATIVE    PHENCYCLIDINE NEGATIVE    PROPOXYPHENE NEGATIVE     Comment: .  THE SUBMITTED URINE SPECIMEN WAS TESTED AT THE LISTED CUTOFFS AND  CONFIRMED BY A SECOND INDEPENDENT CHEMICAL METHOD. Marland Kitchen   DRUG CLASS            INITIAL TEST                             LEVEL .  AMPHETAMINES             1000 ng/mL   BARBITURATES              300 ng/mL  BENZODIAZEPINES           300 ng/mL  COCAINE METABOLITES       300 ng/mL  MARIJUANA METABOLITES      50 ng/mL  METHADONE                 300 ng/mL  METHAQUALONE              300 ng/mL  OPIATES                   300 ng/mL  PHENCYCLIDINE              25 ng/mL  PROPOXYPHENE              300 ng/mL .   TSH     Status: Abnormal   Collection Time: 10/11/18  1:39 PM  Result Value Ref Range   TSH 0.03 (L) mIU/L    Comment:           Reference Range .           > or = 20 Years  0.40-4.50 .                Pregnancy Ranges           First trimester    0.26-2.66           Second trimester   0.55-2.73           Third trimester    0.43-2.91   T4, free     Status: None   Collection Time: 10/11/18  1:39 PM  Result Value Ref Range   Free T4 1.2 0.8 - 1.8 ng/dL  VITAMIN D 25 Hydroxy (Vit-D Deficiency, Fractures)     Status: None   Collection Time: 10/11/18  1:39 PM  Result Value Ref Range   Vit D,  25-Hydroxy 36 30 - 100 ng/mL    Comment: Vitamin D Status         25-OH Vitamin D: . Deficiency:                    <20 ng/mL Insufficiency:             20 - 29 ng/mL Optimal:                 >  or = 30 ng/mL . For 25-OH Vitamin D testing on patients on  D2-supplementation and patients for whom quantitation  of D2 and D3 fractions is required, the QuestAssureD(TM) 25-OH VIT D, (D2,D3), LC/MS/MS is recommended: order  code 807 [id number] [id number] (patients >16yr). . For more information on this test, go to: http://education.questdiagnostics.com/faq/FAQ163 (This link is being provided for  informational/educational purposes only.)     Assessment & Plan:   1. hypothyroidism -Her previsit labs show improved thyroid function profile.  She is more consistent taking her thyroid hormone this time. -She is advised to continue  levothyroxine 137 mcg po qam.   - We discussed about the correct intake of her thyroid hormone, on empty stomach at fasting, with water, separated by at least 30 minutes from breakfast and other medications,  and separated by more than 4 hours from calcium, iron, multivitamins, acid reflux medications (PPIs). -Patient is made aware of the fact that thyroid hormone replacement is needed for life, dose to be adjusted by periodic monitoring of thyroid function tests.   2.   vitamin D deficiency-her vitamin D level is now replete.  She is advised to finish her current supplies of vitamin D2 50,000 units weekly and discontinue after that.  - I advised patient to maintain close follow up with FRedmond School MD for primary care needs. Time versus visit 15 minutes.  SLanice Schwabparticipated in the discussions, expressed understanding, and voiced agreement with the above plans.  All questions were answered to her satisfaction. she is encouraged to contact clinic should she have any questions or concerns prior to her return visit.  Follow up plan: Return in about 6 months (around 04/19/2019)  for Follow up with Pre-visit Labs.  GGlade Lloyd MD Phone: [phone] Fax: [phone] -  This note was partially dictated with voice recognition software. Similar sounding words can be transcribed inadequately or may not  be corrected upon review.  10/18/2018, 10:54 AM

## 2018-10-25 ENCOUNTER — Encounter: Payer: Self-pay | Admitting: Orthopaedic Surgery

## 2018-10-25 ENCOUNTER — Other Ambulatory Visit: Payer: Self-pay

## 2018-10-25 ENCOUNTER — Ambulatory Visit (INDEPENDENT_AMBULATORY_CARE_PROVIDER_SITE_OTHER): Payer: 59 | Admitting: Orthopaedic Surgery

## 2018-10-25 DIAGNOSIS — F17210 Nicotine dependence, cigarettes, uncomplicated: Secondary | ICD-10-CM

## 2018-10-25 DIAGNOSIS — M5441 Lumbago with sciatica, right side: Secondary | ICD-10-CM

## 2018-10-25 DIAGNOSIS — G8929 Other chronic pain: Secondary | ICD-10-CM

## 2018-10-25 NOTE — Progress Notes (Signed)
Virtual Visit via Telephone Note  I connected with Lanice Schwab on 10/25/18 at  9:40 AM EDT by telephone and verified that I am speaking with the correct person using two identifiers.    I discussed the limitations, risks, security and privacy concerns of performing an evaluation and management service by telephone and the availability of in person appointments. I also discussed with the patient that there may be a patient responsible charge related to this service. The patient expressed understanding and agreed to proceed.   History of Present Illness: She has chronic lower back pain with right sided sciatica.  She has good and bad days.  She has more bad days recently.  She has no new trauma, no weakness, no redness.  She is taking her medicine.  She complains of some knee pains but says she does not need to come in to be seen for that now.   Observations/Objective: Per above  Assessment and Plan: Encounter Diagnoses  Name Primary?  . Chronic right-sided low back pain with right-sided sciatica Yes  . Cigarette nicotine dependence without complication      Follow Up Instructions: One month virtual visit.  Continue present medicine.   I discussed the assessment and treatment plan with the patient. The patient was provided an opportunity to ask questions and all were answered. The patient agreed with the plan and demonstrated an understanding of the instructions.   The patient was advised to call back or seek an in-person evaluation if the symptoms worsen or if the condition fails to improve as anticipated.  I provided 8 minutes of non-face-to-face time during this encounter.   Sanjuana Kava, MD

## 2018-11-07 ENCOUNTER — Telehealth: Payer: Self-pay | Admitting: Orthopaedic Surgery

## 2018-11-07 MED ORDER — HYDROCODONE-ACETAMINOPHEN 5-325 MG PO TABS: 1.0000 | ORAL_TABLET | Freq: Four times a day (QID) | ORAL | 0 refills | Status: AC | PRN

## 2018-11-07 NOTE — Telephone Encounter (Signed)
Patient states was advised by Dr Luna Glasgow to call for refill today: HYDROcodone-acetaminophen (NORCO/VICODIN) 5-325 MG tablet 60 tablet  Pharmacy: [facility]

## 2018-11-17 ENCOUNTER — Telehealth (HOSPITAL_COMMUNITY): Payer: Self-pay | Admitting: Psychiatry

## 2018-11-21 ENCOUNTER — Other Ambulatory Visit: Payer: Self-pay

## 2018-11-21 ENCOUNTER — Ambulatory Visit (INDEPENDENT_AMBULATORY_CARE_PROVIDER_SITE_OTHER): Payer: 59 | Admitting: Psychiatry

## 2018-11-21 DIAGNOSIS — F320 Major depressive disorder, single episode, mild: Secondary | ICD-10-CM | POA: Diagnosis not present

## 2018-11-21 DIAGNOSIS — F410 Panic disorder [episodic paroxysmal anxiety] without agoraphobia: Secondary | ICD-10-CM | POA: Diagnosis not present

## 2018-11-21 DIAGNOSIS — F411 Generalized anxiety disorder: Secondary | ICD-10-CM | POA: Diagnosis not present

## 2018-11-21 MED ORDER — DULOXETINE HCL 30 MG PO CPEP: 90.0000 mg | ORAL_CAPSULE | Freq: Every day | ORAL | 0 refills | Status: DC

## 2018-11-21 MED ORDER — ALPRAZOLAM 1 MG PO TABS: 1.0000 mg | ORAL_TABLET | Freq: Three times a day (TID) | ORAL | 0 refills | Status: DC | PRN

## 2018-11-21 NOTE — Progress Notes (Signed)
[place] MD/PA/NP OP Progress Note  11/21/2018 1:13 PM Vanessa Bowen  MRN:  [id number] Interview was conducted by phone and I verified that I was speaking with the correct person using two identifiers. I discussed the limitations of evaluation and management by telemedicine and  the availability of in person appointments. Patient expressed understanding and agreed to proceed.  Chief Complaint: Anxiety, some depression.  HPI: 43 yo married female with panic disorder, GAD and MDD. She has been anxious and having more problems with sleep due to stress related to corona virus (first two cases in [facility]). She reports that clonazepam does not work as effectively for anxiety as alprazolam did. She now does not work so she does not mind if it makes her a little sedated (did in the past). Depression mild - onn 60 mg of duloxetine. Trazodone 150 mg for sleep works well. Pain better controlled as she is back on hydrocodone/APAP.  Visit Diagnosis:    ICD-10-CM   1. GAD (generalized anxiety disorder) F41.1   2. Panic disorder F41.0   3. Current mild episode of major depressive disorder without prior episode ([place]) F32.0     Past Psychiatric History: Please refer to intake H&P.  Past Medical History:  Past Medical History:  Diagnosis Date  . Anxiety   . Arthritis   . Chronic back pain   . Colitis, ulcerative ([place])   . Depression   . Elevated blood pressure, situational   . Hypothyroidism   . Migraine     Past Surgical History:  Procedure Laterality Date  . ANKLE SURGERY    . BREAST ENHANCEMENT SURGERY    . CESAREAN SECTION    . KNEE SURGERY    . LAPAROSCOPIC BILATERAL SALPINGECTOMY  07/27/2012   Procedure: LAPAROSCOPIC BILATERAL SALPINGECTOMY;  Surgeon: Florian Buff, MD;  Location: [facility];  Service: Gynecology;  Laterality: N/A;  . OOPHORECTOMY Right 09/09/2016   Procedure: RIGHT OOPHORECTOMY;  Surgeon: Florian Buff, MD;  Location: [facility];  Service: Gynecology;  Laterality: Right;  .  OVARIAN CYST REMOVAL    . VAGINAL HYSTERECTOMY N/A 09/09/2016   Procedure: HYSTERECTOMY VAGINAL;  Surgeon: Florian Buff, MD;  Location: [facility];  Service: Gynecology;  Laterality: N/A;    Family Psychiatric History: Reviewed.  Family History:  Family History  Problem Relation Age of Onset  . Cancer Maternal Grandmother   . Cancer Maternal Grandfather   . Alcohol abuse Maternal Grandfather   . Hypothyroidism Daughter   . Bipolar disorder Daughter   . Anxiety disorder Daughter   . Depression Mother   . Alcohol abuse Mother   . Drug abuse Sister   . Anxiety disorder Sister     Social History:  Social History   Socioeconomic History  . Marital status: Married    Spouse name: Not on file  . Number of children: 2  . Years of education: Not on file  . Highest education level: Not on file  Occupational History  . Not on file  Social Needs  . Financial resource strain: Not on file  . Food insecurity:    Worry: Not on file    Inability: Not on file  . Transportation needs:    Medical: Not on file    Non-medical: Not on file  Tobacco Use  . Smoking status: Current Every Day Smoker    Packs/day: 0.50    Years: 15.00    Pack years: 7.50    Types: Cigarettes  . Smokeless tobacco: Never Used  Substance and Sexual Activity  . Alcohol use: Yes    Comment: Occasionally  . Drug use: No  . Sexual activity: Yes    Birth control/protection: Surgical    Comment: hyst  Lifestyle  . Physical activity:    Days per week: Not on file    Minutes per session: Not on file  . Stress: Not on file  Relationships  . Social connections:    Talks on phone: Not on file    Gets together: Not on file    Attends religious service: Not on file    Active member of club or organization: Not on file    Attends meetings of clubs or organizations: Not on file    Relationship status: Not on file  Other Topics Concern  . Not on file  Social History Narrative  . Not on file    Allergies:   Allergies  Allergen Reactions  . Doxycycline Nausea And Vomiting  . Sulfonamide Derivatives Nausea And Vomiting    Metabolic Disorder Labs: Lab Results  Component Value Date   HGBA1C 5.2 04/27/2017   MPG 103 04/27/2017   MPG 100 07/21/2016   No results found for: PROLACTIN No results found for: CHOL, TRIG, HDL, CHOLHDL, VLDL, LDLCALC Lab Results  Component Value Date   TSH 0.03 (L) 10/11/2018   TSH 146.19 (H) 07/15/2018    Therapeutic Level Labs: No results found for: LITHIUM No results found for: VALPROATE No components found for:  CBMZ  Current Medications: Current Outpatient Medications  Medication Sig Dispense Refill  . conjugated estrogens (PREMARIN) vaginal cream Use 1 gram nightly 30 g 12  . dicyclomine (BENTYL) 20 MG tablet Take 1 tablet (20 mg total) by mouth 3 (three) times daily as needed for up to 30 days for spasms. 90 tablet 0  . DULoxetine (CYMBALTA) 60 MG capsule Take 1 capsule (60 mg total) by mouth daily. 30 capsule 2  . Estradiol (DIVIGEL) 1 MG/GM GEL Place 1 packet onto the skin daily. 30 g 11  . furosemide (LASIX) 20 MG tablet Take 20 mg daily as needed by mouth for fluid.  2  . HYDROcodone-acetaminophen (NORCO/VICODIN) 5-325 MG tablet Take 1 tablet by mouth every 6 (six) hours as needed for up to 30 days for moderate pain (Must last 30 days.). 60 tablet 0  . ibuprofen (ADVIL,MOTRIN) 200 MG tablet Take 400-600 mg by mouth every 8 (eight) hours as needed (for pain/headaches.).    Marland Kitchen levothyroxine (SYNTHROID) 137 MCG tablet Take 1 tablet (137 mcg total) by mouth daily before breakfast. 30 tablet 6  . promethazine (PHENERGAN) 25 MG tablet Take 1 tablet (25 mg total) by mouth every 6 (six) hours as needed for up to 30 days for nausea or vomiting. 90 tablet 0  . tiZANidine (ZANAFLEX) 4 MG tablet Take 4-8 mg by mouth See admin instructions. 4 mg daily as needed for muscle spasms, tension, or mild anxiety & 4-8 mg at bedtime as needed for sleep/anxiety/spasms.     . traZODone (DESYREL) 150 MG tablet Take 1 tablet (150 mg total) by mouth at bedtime as needed for sleep. 30 tablet 2  . Vitamin D, Ergocalciferol, (DRISDOL) 1.25 MG (50000 UT) CAPS capsule Take 1 capsule (50,000 Units total) by mouth every 7 (seven) days. 12 capsule 0   No current facility-administered medications for this visit.    Psychiatric Specialty Exam: Review of Systems  Musculoskeletal: Positive for back pain.  Psychiatric/Behavioral: Positive for depression. The patient is nervous/anxious.   All  other systems reviewed and are negative.   Last menstrual period 07/24/2016.There is no height or weight on file to calculate BMI.  General Appearance: NA  Eye Contact:  NA  Speech:  Clear and Coherent  Volume:  Normal  Mood:  Anxious and Depressed  Affect:  NA  Thought Process:  Goal Directed  Orientation:  Full (Time, Place, and Person)  Thought Content: No delusions.  Suicidal Thoughts:  No  Homicidal Thoughts:  No  Memory:  Immediate;   Good Recent;   Good Remote;   Good  Judgement:  Fair  Insight:  Fair  Psychomotor Activity:  NA  Concentration:  Concentration: Good  Recall:  Good  Fund of Knowledge: Good  Language: Good  Akathisia:  NA  Handed:  Right  AIMS (if indicated): not done  Assets:  Communication Skills Desire for Improvement Housing Resilience  ADL's:  Intact  Cognition: WNL  Sleep:  Good   Screenings: PHQ2-9     Office Visit from 03/18/2016 in [facility]  PHQ-2 Total Score  0       Assessment and Plan: 43 yo married female with panic disorder, GAD and MDD. She has been anxious and having more problems with sleep due to stress related to corona virus (first two cases in [facility]). She reports that clonazepam does not work as effectively for anxiety as alprazolam did. She now does not work so she does not mind if it makes her a little sedated (did in the past). Depression mild - on 60 mg of duloxetine. Trazodone  150 mg for sleep works well. Pain better controlled as she is back on hydrocodone/APAP.  Plan: Increase duloxetine to 90 mg, change clonazepam to alprazolam 1 mg tid pen anxiety, continue trazodone 150 mg at HS for sleep. Next visit in 4 weeks.  Stephanie Acre, MD 11/21/2018, 1:13 PM

## 2018-11-23 ENCOUNTER — Encounter: Payer: Self-pay | Admitting: Orthopaedic Surgery

## 2018-11-23 ENCOUNTER — Ambulatory Visit (INDEPENDENT_AMBULATORY_CARE_PROVIDER_SITE_OTHER): Payer: 59 | Admitting: Orthopaedic Surgery

## 2018-11-23 ENCOUNTER — Other Ambulatory Visit: Payer: Self-pay

## 2018-11-23 DIAGNOSIS — M5441 Lumbago with sciatica, right side: Secondary | ICD-10-CM

## 2018-11-23 DIAGNOSIS — G8929 Other chronic pain: Secondary | ICD-10-CM

## 2018-11-23 NOTE — Progress Notes (Signed)
Virtual Visit via Telephone Note  I connected with@ on 11/23/18 at  9:30 AM EDT by telephone and verified that I am speaking with the correct person using two identifiers.  Location: Patient: home Provider: home   I discussed the limitations, risks, security and privacy concerns of performing an evaluation and management service by telephone and the availability of in person appointments. I also discussed with the patient that there may be a patient responsible charge related to this service. The patient expressed understanding and agreed to proceed.   History of Present Illness: She has chronic lower back pain that has good and bad days.  She has no new trauma, no weakness.  She is taking her medicine and doing her exercises.   Observations/Objective: Per above.  Assessment and Plan: Encounter Diagnosis  Name Primary?  . Chronic right-sided low back pain with right-sided sciatica Yes       Follow Up Instructions: 3 months.  Continue present course.   I discussed the assessment and treatment plan with the patient. The patient was provided an opportunity to ask questions and all were answered. The patient agreed with the plan and demonstrated an understanding of the instructions.   The patient was advised to call back or seek an in-person evaluation if the symptoms worsen or if the condition fails to improve as anticipated.  I provided 5 minutes of non-face-to-face time during this encounter.   Sanjuana Kava, MD

## 2018-11-28 ENCOUNTER — Other Ambulatory Visit (HOSPITAL_COMMUNITY): Payer: Self-pay

## 2018-11-28 MED ORDER — DULOXETINE HCL 60 MG PO CPEP: 60.0000 mg | ORAL_CAPSULE | Freq: Every day | ORAL | 2 refills | Status: DC

## 2018-11-28 MED ORDER — DULOXETINE HCL 30 MG PO CPEP: 30.0000 mg | ORAL_CAPSULE | Freq: Every day | ORAL | 2 refills | Status: DC

## 2018-12-09 ENCOUNTER — Other Ambulatory Visit: Payer: Self-pay | Admitting: "Endocrinology

## 2018-12-12 ENCOUNTER — Telehealth: Payer: Self-pay | Admitting: Orthopaedic Surgery

## 2018-12-12 NOTE — Telephone Encounter (Signed)
Hydrocodone-Acetaminophen  5/325 mg  Qty  60 Tablets  PATIENT USES [facility]

## 2018-12-13 ENCOUNTER — Telehealth (HOSPITAL_COMMUNITY): Payer: Self-pay

## 2018-12-13 MED ORDER — HYDROCODONE-ACETAMINOPHEN 5-325 MG PO TABS: 1.0000 | ORAL_TABLET | Freq: Four times a day (QID) | ORAL | 0 refills | Status: DC | PRN

## 2018-12-13 NOTE — Telephone Encounter (Signed)
This is not a psychiatric medication and she should continue to get it from her PCP.

## 2018-12-13 NOTE — Telephone Encounter (Signed)
Patient called regarding her Zanaflex 45m that has been prescribed by her family practitioner. She stated that she is out of it and requested that you take over management of the medication. She has a scheduled appointment on 12/19/18. Please review and advise. Thank you.

## 2018-12-19 ENCOUNTER — Ambulatory Visit (INDEPENDENT_AMBULATORY_CARE_PROVIDER_SITE_OTHER): Payer: 59 | Admitting: Psychiatry

## 2018-12-19 ENCOUNTER — Other Ambulatory Visit: Payer: Self-pay

## 2018-12-19 DIAGNOSIS — F320 Major depressive disorder, single episode, mild: Secondary | ICD-10-CM

## 2018-12-19 DIAGNOSIS — F411 Generalized anxiety disorder: Secondary | ICD-10-CM | POA: Diagnosis not present

## 2018-12-19 DIAGNOSIS — F410 Panic disorder [episodic paroxysmal anxiety] without agoraphobia: Secondary | ICD-10-CM

## 2018-12-19 MED ORDER — ALPRAZOLAM 1 MG PO TABS: 1.0000 mg | ORAL_TABLET | Freq: Three times a day (TID) | ORAL | 1 refills | Status: DC | PRN

## 2018-12-19 MED ORDER — TRAZODONE HCL 100 MG PO TABS: 200.0000 mg | ORAL_TABLET | Freq: Every evening | ORAL | 1 refills | Status: DC | PRN

## 2018-12-19 NOTE — Progress Notes (Signed)
[place] MD/PA/NP OP Progress Note  12/19/2018 1:13 PM Vanessa Bowen  MRN:  [id number] Interview was conducted by phone and I verified that I was speaking with the correct person using two identifiers. I discussed the limitations of evaluation and management by telemedicine and  the availability of in person appointments. Patient expressed understanding and agreed to proceed.  Chief Complaint: Insomnia, anxiety.  HPI: 43 yo married female with panic disorder, GAD and MDD. She has beenanxious and having more problems with sleep due to stress related to corona virus (first two cases in [facility]). She reports that alprazolam works better than clonazepam did. Still she needs to take it daily 2-3 times. She denies feeling sedated after taking it (was a problem previously). Depression is mild - on 90 mg of duloxetine now. Trazodone 150 mg for sleep works less well than before. Pain better controlled as she is back on hydrocodone/APAP.             Visit Diagnosis:    ICD-10-CM   1. Panic disorder  F41.0   2. GAD (generalized anxiety disorder)  F41.1   3. Current mild episode of major depressive disorder without prior episode ([place])  F32.0     Past Psychiatric History: Please refer to intake H&P.  Past Medical History:  Past Medical History:  Diagnosis Date  . Anxiety   . Arthritis   . Chronic back pain   . Colitis, ulcerative ([place])   . Depression   . Elevated blood pressure, situational   . Hypothyroidism   . Migraine     Past Surgical History:  Procedure Laterality Date  . ANKLE SURGERY    . BREAST ENHANCEMENT SURGERY    . CESAREAN SECTION    . KNEE SURGERY    . LAPAROSCOPIC BILATERAL SALPINGECTOMY  07/27/2012   Procedure: LAPAROSCOPIC BILATERAL SALPINGECTOMY;  Surgeon: Florian Buff, MD;  Location: [facility];  Service: Gynecology;  Laterality: N/A;  . OOPHORECTOMY Right 09/09/2016   Procedure: RIGHT OOPHORECTOMY;  Surgeon: Florian Buff, MD;  Location: [facility];  Service: Gynecology;   Laterality: Right;  . OVARIAN CYST REMOVAL    . VAGINAL HYSTERECTOMY N/A 09/09/2016   Procedure: HYSTERECTOMY VAGINAL;  Surgeon: Florian Buff, MD;  Location: [facility];  Service: Gynecology;  Laterality: N/A;    Family Psychiatric History: Reviewed.  Family History:  Family History  Problem Relation Age of Onset  . Cancer Maternal Grandmother   . Cancer Maternal Grandfather   . Alcohol abuse Maternal Grandfather   . Hypothyroidism Daughter   . Bipolar disorder Daughter   . Anxiety disorder Daughter   . Depression Mother   . Alcohol abuse Mother   . Drug abuse Sister   . Anxiety disorder Sister     Social History:  Social History   Socioeconomic History  . Marital status: Married    Spouse name: Not on file  . Number of children: 2  . Years of education: Not on file  . Highest education level: Not on file  Occupational History  . Not on file  Social Needs  . Financial resource strain: Not on file  . Food insecurity    Worry: Not on file    Inability: Not on file  . Transportation needs    Medical: Not on file    Non-medical: Not on file  Tobacco Use  . Smoking status: Current Every Day Smoker    Packs/day: 0.50    Years: 15.00    Pack years: 7.50  Types: Cigarettes  . Smokeless tobacco: Never Used  Substance and Sexual Activity  . Alcohol use: Yes    Comment: Occasionally  . Drug use: No  . Sexual activity: Yes    Birth control/protection: Surgical    Comment: hyst  Lifestyle  . Physical activity    Days per week: Not on file    Minutes per session: Not on file  . Stress: Not on file  Relationships  . Social Herbalist on phone: Not on file    Gets together: Not on file    Attends religious service: Not on file    Active member of club or organization: Not on file    Attends meetings of clubs or organizations: Not on file    Relationship status: Not on file  Other Topics Concern  . Not on file  Social History Narrative  . Not on file     Allergies:  Allergies  Allergen Reactions  . Doxycycline Nausea And Vomiting  . Sulfonamide Derivatives Nausea And Vomiting    Metabolic Disorder Labs: Lab Results  Component Value Date   HGBA1C 5.2 04/27/2017   MPG 103 04/27/2017   MPG 100 07/21/2016   No results found for: PROLACTIN No results found for: CHOL, TRIG, HDL, CHOLHDL, VLDL, LDLCALC Lab Results  Component Value Date   TSH 0.03 (L) 10/11/2018   TSH 146.19 (H) 07/15/2018    Therapeutic Level Labs: No results found for: LITHIUM No results found for: VALPROATE No components found for:  CBMZ  Current Medications: Current Outpatient Medications  Medication Sig Dispense Refill  . ALPRAZolam (XANAX) 1 MG tablet Take 1 tablet (1 mg total) by mouth 3 (three) times daily as needed for anxiety. 90 tablet 1  . conjugated estrogens (PREMARIN) vaginal cream Use 1 gram nightly 30 g 12  . dicyclomine (BENTYL) 20 MG tablet Take 1 tablet (20 mg total) by mouth 3 (three) times daily as needed for up to 30 days for spasms. 90 tablet 0  . DULoxetine (CYMBALTA) 30 MG capsule Take 1 capsule (30 mg total) by mouth daily. Take with the 60 mg 30 capsule 2  . DULoxetine (CYMBALTA) 60 MG capsule Take 1 capsule (60 mg total) by mouth daily. Take with the 30 mg 30 capsule 2  . Estradiol (DIVIGEL) 1 MG/GM GEL Place 1 packet onto the skin daily. 30 g 11  . furosemide (LASIX) 20 MG tablet Take 20 mg daily as needed by mouth for fluid.  2  . HYDROcodone-acetaminophen (NORCO/VICODIN) 5-325 MG tablet Take 1 tablet by mouth every 6 (six) hours as needed for up to 30 days for moderate pain (Must last 30 days.). 60 tablet 0  . ibuprofen (ADVIL,MOTRIN) 200 MG tablet Take 400-600 mg by mouth every 8 (eight) hours as needed (for pain/headaches.).    Marland Kitchen levothyroxine (SYNTHROID) 137 MCG tablet Take 1 tablet (137 mcg total) by mouth daily before breakfast. 30 tablet 6  . promethazine (PHENERGAN) 25 MG tablet Take 1 tablet (25 mg total) by mouth every 6  (six) hours as needed for up to 30 days for nausea or vomiting. 90 tablet 0  . tiZANidine (ZANAFLEX) 4 MG tablet Take 4-8 mg by mouth See admin instructions. 4 mg daily as needed for muscle spasms, tension, or mild anxiety & 4-8 mg at bedtime as needed for sleep/anxiety/spasms.    . traZODone (DESYREL) 100 MG tablet Take 2 tablets (200 mg total) by mouth at bedtime as needed for sleep. 60 tablet  1  . Vitamin D, Ergocalciferol, (DRISDOL) 1.25 MG (50000 UT) CAPS capsule TAKE 1 CAPSULE (50,000 UNITS TOTAL) BY MOUTH EVERY 7 (SEVEN) DAYS. 12 capsule 0   No current facility-administered medications for this visit.     Psychiatric Specialty Exam: Review of Systems  Musculoskeletal: Positive for back pain.  Psychiatric/Behavioral: The patient is nervous/anxious and has insomnia.   All other systems reviewed and are negative.   Last menstrual period 07/24/2016.There is no height or weight on file to calculate BMI.  General Appearance: NA  Eye Contact:  NA  Speech:  Clear and Coherent  Volume:  Normal  Mood:  Anxious  Affect:  NA  Thought Process:  Goal Directed  Orientation:  Full (Time, Place, and Person)  Thought Content: Logical   Suicidal Thoughts:  No  Homicidal Thoughts:  No  Memory:  Immediate;   Good Recent;   Good Remote;   Good  Judgement:  Good  Insight:  Fair  Psychomotor Activity:  NA  Concentration:  Concentration: Good  Recall:  Good  Fund of Knowledge: Good  Language: Good  Akathisia:  Negative  Handed:  Right  AIMS (if indicated): not done  Assets:  Communication Skills Desire for Improvement Financial Resources/Insurance Housing Intimacy Social Support  ADL's:  Intact  Cognition: WNL  Sleep:  Fair   Screenings: PHQ2-9     Office Visit from 03/18/2016 in [facility]  PHQ-2 Total Score  0       Assessment and Plan: 43 yo married female with panic disorder, GAD and MDD. She has beenanxious and having more problems with sleep due  to stress related to corona virus (first two cases in [facility]). She reports that alprazolam works better than clonazepam did. Still she needs to take it daily 2-3 times. She denies feeling sedated after taking it (was a problem previously). Depression is mild - on 90 mg of duloxetine now. Trazodone 150 mg for sleep works less well than before. Pain better controlled as she is back on hydrocodone/APAP.             Plan: Continue duloxetine to 90 mg, alprazolam 1 mg tid prn anxiety and increase trazodone dose to 200 mg at HS for sleep. The plan was discussed with patient who had an opportunity to ask questions and these were all answered. I spend 25 minutes in phone contact with patient. Next appointment in 2 months   Stephanie Acre, MD 12/19/2018, 1:13 PM

## 2019-01-09 ENCOUNTER — Telehealth: Payer: Self-pay

## 2019-01-09 NOTE — Telephone Encounter (Signed)
Hydrocodone-Acetaminophen  5/325 mg  Qty 60 Tablets  PATIENT USES [facility]

## 2019-01-10 MED ORDER — HYDROCODONE-ACETAMINOPHEN 5-325 MG PO TABS: 1.0000 | ORAL_TABLET | Freq: Four times a day (QID) | ORAL | 0 refills | Status: DC | PRN

## 2019-01-24 ENCOUNTER — Ambulatory Visit (INDEPENDENT_AMBULATORY_CARE_PROVIDER_SITE_OTHER): Payer: 59 | Admitting: Psychiatry

## 2019-01-24 ENCOUNTER — Other Ambulatory Visit: Payer: Self-pay

## 2019-01-24 DIAGNOSIS — F411 Generalized anxiety disorder: Secondary | ICD-10-CM | POA: Diagnosis not present

## 2019-01-24 DIAGNOSIS — F339 Major depressive disorder, recurrent, unspecified: Secondary | ICD-10-CM | POA: Diagnosis not present

## 2019-01-24 DIAGNOSIS — F325 Major depressive disorder, single episode, in full remission: Secondary | ICD-10-CM

## 2019-01-24 DIAGNOSIS — F410 Panic disorder [episodic paroxysmal anxiety] without agoraphobia: Secondary | ICD-10-CM

## 2019-01-24 MED ORDER — TRAZODONE HCL 100 MG PO TABS: 200.0000 mg | ORAL_TABLET | Freq: Every evening | ORAL | 0 refills | Status: DC | PRN

## 2019-01-24 MED ORDER — ALPRAZOLAM 1 MG PO TABS: 1.0000 mg | ORAL_TABLET | Freq: Three times a day (TID) | ORAL | 2 refills | Status: AC | PRN

## 2019-01-24 MED ORDER — DULOXETINE HCL 30 MG PO CPEP: 30.0000 mg | ORAL_CAPSULE | Freq: Every day | ORAL | 0 refills | Status: DC

## 2019-01-24 MED ORDER — DULOXETINE HCL 60 MG PO CPEP: 60.0000 mg | ORAL_CAPSULE | Freq: Every day | ORAL | 0 refills | Status: DC

## 2019-01-24 NOTE — Progress Notes (Signed)
[facility] MD/PA/NP OP Progress Note  01/24/2019 1:07 PM Vanessa Bowen  MRN:  [id number] Interview was conducted by phone and I verified that I was speaking with the correct person using two identifiers. I discussed the limitations of evaluation and management by telemedicine and  the availability of in person appointments. Patient expressed understanding and agreed to proceed.  Chief Complaint: "I am doing very well".  HPI: 43 yo married female with panic disorder, GAD and MDD.She has beendepressed, anxious and having more problems with sleep due to stress related to corona virus. She has a hx of PTSD but no residual sx consistent with current such diagnosis. Shereported that alprazolam works better than clonazepam did so we changed it back. We increased duloxetine and trazodone doses. Clarene now reports feeling emotionally stable, sleeping well. Alprazolam used as needed. No SI, normal appetite, no psychosis. Pain better controlled as she is back on hydrocodone/APAP.  Visit Diagnosis:    ICD-10-CM   1. Major depressive disorder with single episode, in full remission ([place])  F32.5   2. Panic disorder  F41.0   3. GAD (generalized anxiety disorder)  F41.1     Past Psychiatric History: Please see intake H&P/  Past Medical History:  Past Medical History:  Diagnosis Date  . Anxiety   . Arthritis   . Chronic back pain   . Colitis, ulcerative ([place])   . Depression   . Elevated blood pressure, situational   . Hypothyroidism   . Migraine     Past Surgical History:  Procedure Laterality Date  . ANKLE SURGERY    . BREAST ENHANCEMENT SURGERY    . CESAREAN SECTION    . KNEE SURGERY    . LAPAROSCOPIC BILATERAL SALPINGECTOMY  07/27/2012   Procedure: LAPAROSCOPIC BILATERAL SALPINGECTOMY;  Surgeon: Florian Buff, MD;  Location: [facility];  Service: Gynecology;  Laterality: N/A;  . OOPHORECTOMY Right 09/09/2016   Procedure: RIGHT OOPHORECTOMY;  Surgeon: Florian Buff, MD;  Location: [facility];  Service:  Gynecology;  Laterality: Right;  . OVARIAN CYST REMOVAL    . VAGINAL HYSTERECTOMY N/A 09/09/2016   Procedure: HYSTERECTOMY VAGINAL;  Surgeon: Florian Buff, MD;  Location: [facility];  Service: Gynecology;  Laterality: N/A;    Family Psychiatric History: Reviewed.  Family History:  Family History  Problem Relation Age of Onset  . Cancer Maternal Grandmother   . Cancer Maternal Grandfather   . Alcohol abuse Maternal Grandfather   . Hypothyroidism Daughter   . Bipolar disorder Daughter   . Anxiety disorder Daughter   . Depression Mother   . Alcohol abuse Mother   . Drug abuse Sister   . Anxiety disorder Sister     Social History:  Social History   Socioeconomic History  . Marital status: Married    Spouse name: Not on file  . Number of children: 2  . Years of education: Not on file  . Highest education level: Not on file  Occupational History  . Not on file  Social Needs  . Financial resource strain: Not on file  . Food insecurity    Worry: Not on file    Inability: Not on file  . Transportation needs    Medical: Not on file    Non-medical: Not on file  Tobacco Use  . Smoking status: Current Every Day Smoker    Packs/day: 0.50    Years: 15.00    Pack years: 7.50    Types: Cigarettes  . Smokeless tobacco: Never Used  Substance  and Sexual Activity  . Alcohol use: Yes    Comment: Occasionally  . Drug use: No  . Sexual activity: Yes    Birth control/protection: Surgical    Comment: hyst  Lifestyle  . Physical activity    Days per week: Not on file    Minutes per session: Not on file  . Stress: Not on file  Relationships  . Social Herbalist on phone: Not on file    Gets together: Not on file    Attends religious service: Not on file    Active member of club or organization: Not on file    Attends meetings of clubs or organizations: Not on file    Relationship status: Not on file  Other Topics Concern  . Not on file  Social History Narrative  .  Not on file    Allergies:  Allergies  Allergen Reactions  . Doxycycline Nausea And Vomiting  . Sulfonamide Derivatives Nausea And Vomiting    Metabolic Disorder Labs: Lab Results  Component Value Date   HGBA1C 5.2 04/27/2017   MPG 103 04/27/2017   MPG 100 07/21/2016   No results found for: PROLACTIN No results found for: CHOL, TRIG, HDL, CHOLHDL, VLDL, LDLCALC Lab Results  Component Value Date   TSH 0.03 (L) 10/11/2018   TSH 146.19 (H) 07/15/2018    Therapeutic Level Labs: No results found for: LITHIUM No results found for: VALPROATE No components found for:  CBMZ  Current Medications: Current Outpatient Medications  Medication Sig Dispense Refill  . ALPRAZolam (XANAX) 1 MG tablet Take 1 tablet (1 mg total) by mouth 3 (three) times daily as needed for anxiety. 90 tablet 2  . conjugated estrogens (PREMARIN) vaginal cream Use 1 gram nightly 30 g 12  . dicyclomine (BENTYL) 20 MG tablet Take 1 tablet (20 mg total) by mouth 3 (three) times daily as needed for up to 30 days for spasms. 90 tablet 0  . DULoxetine (CYMBALTA) 30 MG capsule Take 1 capsule (30 mg total) by mouth daily. Take with the 60 mg 90 capsule 0  . DULoxetine (CYMBALTA) 60 MG capsule Take 1 capsule (60 mg total) by mouth daily. Take with the 30 mg 90 capsule 0  . Estradiol (DIVIGEL) 1 MG/GM GEL Place 1 packet onto the skin daily. 30 g 11  . furosemide (LASIX) 20 MG tablet Take 20 mg daily as needed by mouth for fluid.  2  . HYDROcodone-acetaminophen (NORCO/VICODIN) 5-325 MG tablet Take 1 tablet by mouth every 6 (six) hours as needed for moderate pain (Must last 30 days.). 60 tablet 0  . ibuprofen (ADVIL,MOTRIN) 200 MG tablet Take 400-600 mg by mouth every 8 (eight) hours as needed (for pain/headaches.).    Marland Kitchen levothyroxine (SYNTHROID) 137 MCG tablet Take 1 tablet (137 mcg total) by mouth daily before breakfast. 30 tablet 6  . promethazine (PHENERGAN) 25 MG tablet Take 1 tablet (25 mg total) by mouth every 6 (six)  hours as needed for up to 30 days for nausea or vomiting. 90 tablet 0  . tiZANidine (ZANAFLEX) 4 MG tablet Take 4-8 mg by mouth See admin instructions. 4 mg daily as needed for muscle spasms, tension, or mild anxiety & 4-8 mg at bedtime as needed for sleep/anxiety/spasms.    . traZODone (DESYREL) 100 MG tablet Take 2 tablets (200 mg total) by mouth at bedtime as needed for sleep. 90 tablet 0  . Vitamin D, Ergocalciferol, (DRISDOL) 1.25 MG (50000 UT) CAPS capsule TAKE 1  CAPSULE (50,000 UNITS TOTAL) BY MOUTH EVERY 7 (SEVEN) DAYS. 12 capsule 0   No current facility-administered medications for this visit.     Psychiatric Specialty Exam: Review of Systems  Musculoskeletal: Positive for back pain.  Psychiatric/Behavioral: The patient is nervous/anxious.   All other systems reviewed and are negative.   Last menstrual period 07/24/2016.There is no height or weight on file to calculate BMI.  General Appearance: NA  Eye Contact:  NA  Speech:  Clear and Coherent and Normal Rate  Volume:  Normal  Mood:  Anxious and no depression  Affect:  NA  Thought Process:  Goal Directed  Orientation:  Full (Time, Place, and Person)  Thought Content: Logical   Suicidal Thoughts:  No  Homicidal Thoughts:  No  Memory:  Immediate;   Good Recent;   Good Remote;   Good  Judgement:  Good  Insight:  Good  Psychomotor Activity:  NA  Concentration:  Concentration: Good  Recall:  Good  Fund of Knowledge: Good  Language: Good  Akathisia:  Negative  Handed:  Right  AIMS (if indicated): not done  Assets:  Communication Skills Desire for Improvement Financial Resources/Insurance Housing Resilience Social Support  ADL's:  Intact  Cognition: WNL  Sleep:  Good   Screenings: PHQ2-9     Office Visit from 03/18/2016 in [facility]  PHQ-2 Total Score  0       Assessment and Plan: 43 yo married female with panic disorder, GAD and MDD.She has beendepressed, anxious and having more  problems with sleep due to stress related to corona virus. Shereported that alprazolam works better than clonazepam did so we changed it back. We increased duloxetine and trazodone doses. Ceilidh now reports feeling emotionally stable, sleeping well. Alprazolam used as needed/ No SI, normal appetite, no psychosis. Pain better controlled as she is back on hydrocodone/APAP.  Plan: Continue duloxetine to 90 mg, alprazolam 1 mg tid prn anxiety and trazodone 200 mg at HS for sleep. The plan was discussed with patient who had an opportunity to ask questions and these were all answered. I spend 25 minutes in phone contact with patient. Next appointment in 3 months.    Stephanie Acre, MD 01/24/2019, 1:07 PM

## 2019-02-07 ENCOUNTER — Telehealth: Payer: Self-pay | Admitting: Orthopaedic Surgery

## 2019-02-07 MED ORDER — HYDROCODONE-ACETAMINOPHEN 5-325 MG PO TABS: 1.0000 | ORAL_TABLET | Freq: Four times a day (QID) | ORAL | 0 refills | Status: DC | PRN

## 2019-02-07 NOTE — Telephone Encounter (Signed)
Patient requested refill on Hydrocodone/Acetaminophen 5-325  Mgs.  Qty  60  Sig: Take 1 tablet by mouth every 6 (six) hours as needed for moderate pain (Must last 30 days.).   Patient states she uses [facility] in [place]

## 2019-02-08 IMAGING — DX DG CHEST 2V
2 series · 2 of 2 positions shown · non-contrast
Comparison: 02/06/2016.

CLINICAL DATA: Left chest pain and shortness of breath.

EXAM:
CHEST  2 VIEW

[chest pa]
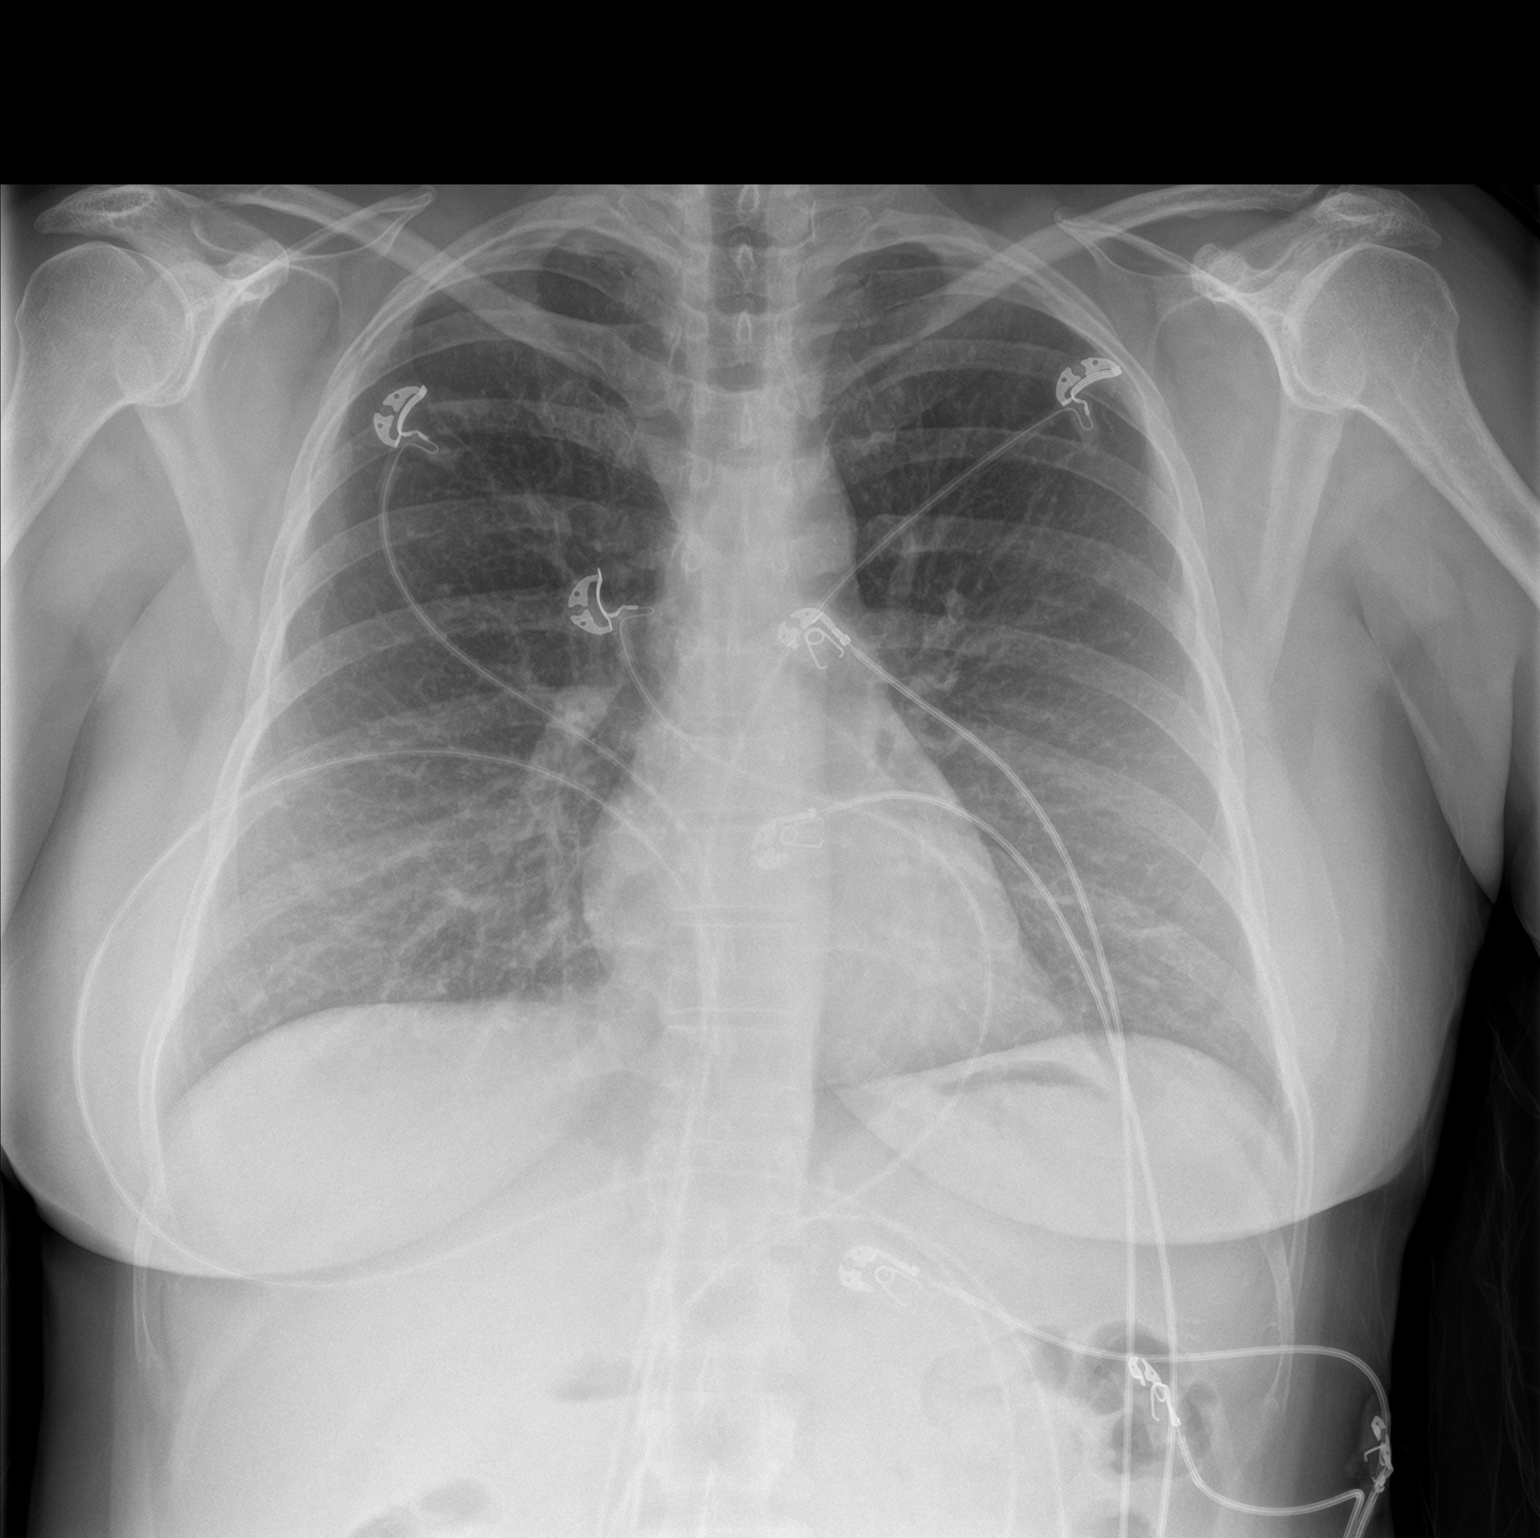

[chest lat]
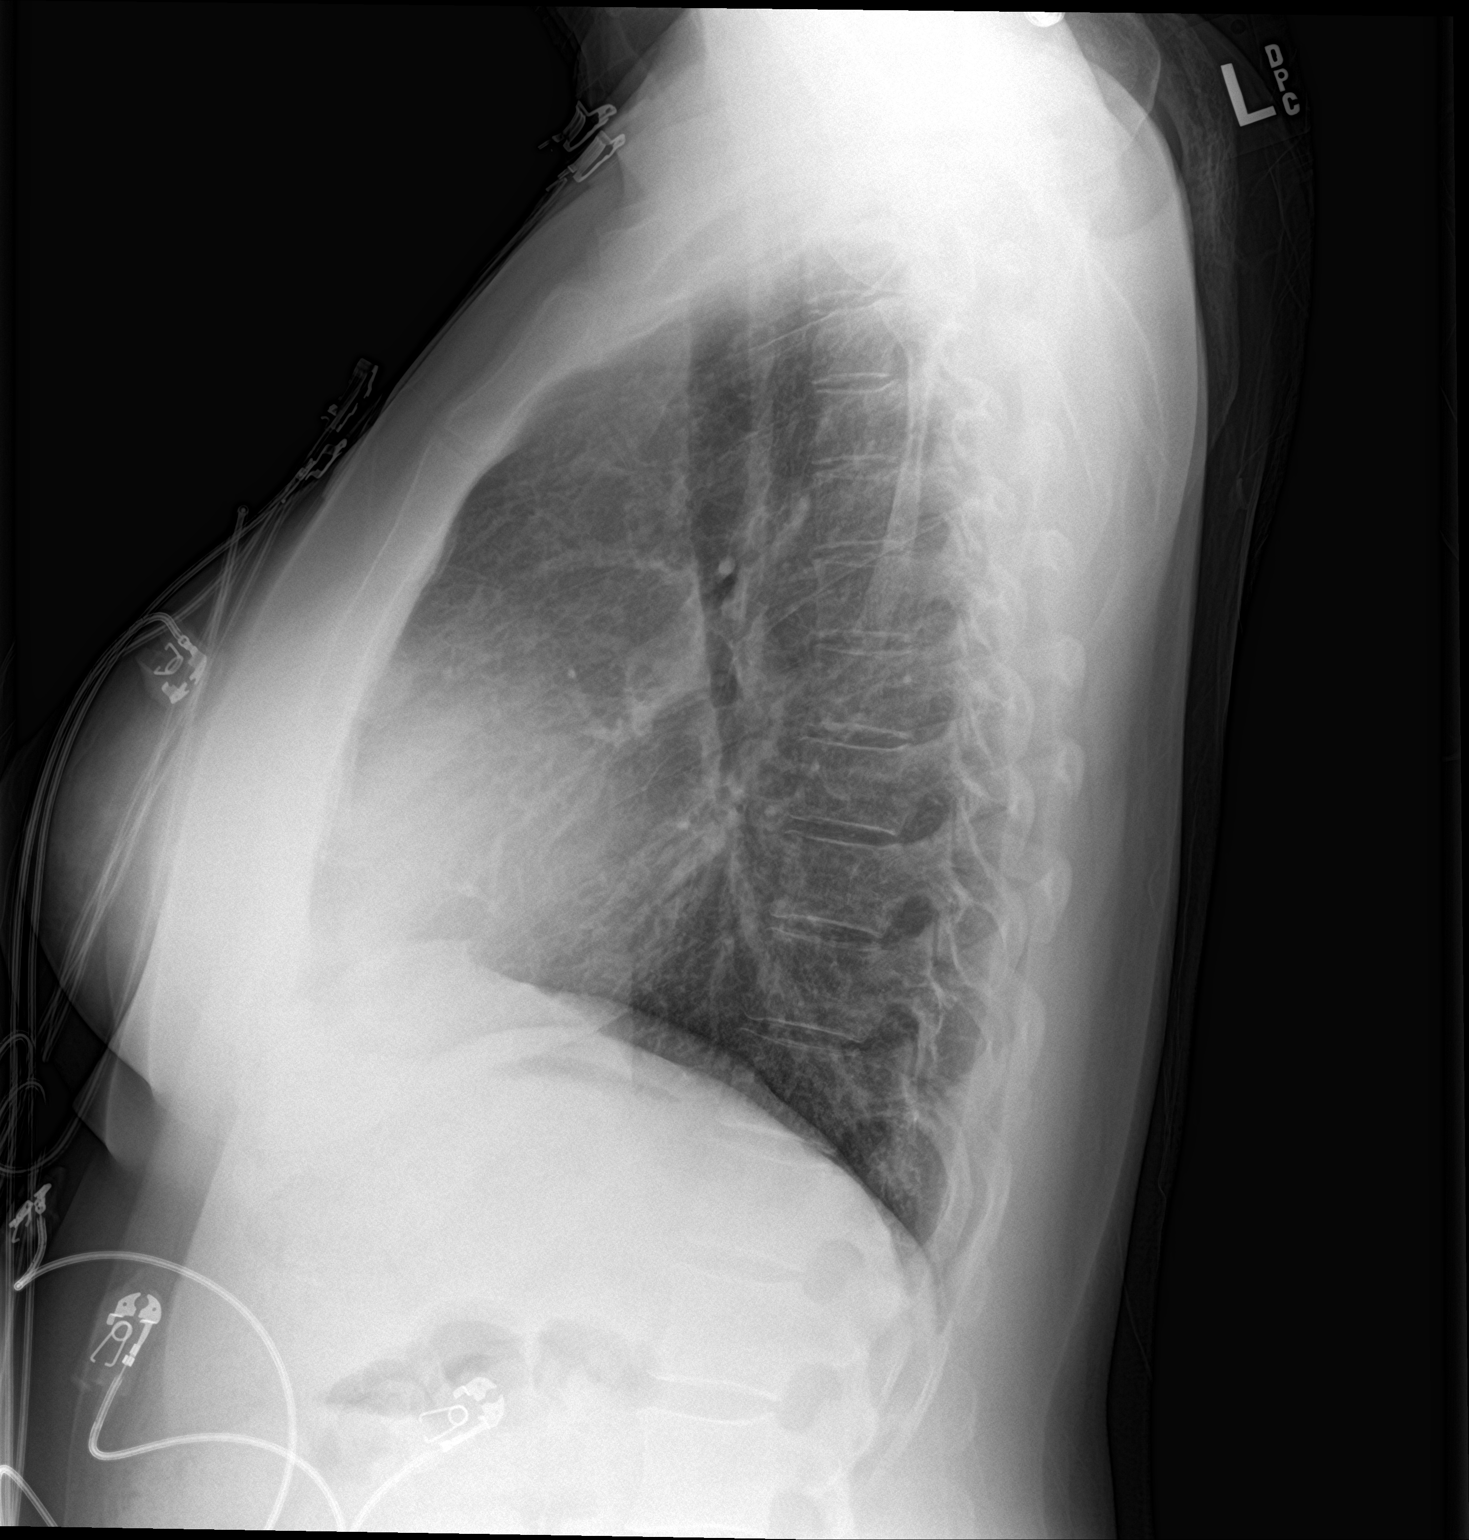

[2 of 2 positions shown; findings below may reference images not displayed]

FINDINGS: Normal sized heart. Stable mild linear scarring at the left lung
base. Otherwise, clear lungs with normal vascularity. Unremarkable
bones.
IMPRESSION: No acute abnormality.

## 2019-02-28 ENCOUNTER — Ambulatory Visit: Payer: 59 | Admitting: Orthopaedic Surgery

## 2019-03-02 ENCOUNTER — Other Ambulatory Visit: Payer: Self-pay

## 2019-03-02 ENCOUNTER — Encounter: Payer: Self-pay | Admitting: Orthopaedic Surgery

## 2019-03-02 ENCOUNTER — Other Ambulatory Visit (HOSPITAL_COMMUNITY): Payer: Self-pay | Admitting: Psychiatry

## 2019-03-02 ENCOUNTER — Ambulatory Visit (INDEPENDENT_AMBULATORY_CARE_PROVIDER_SITE_OTHER): Payer: 59 | Admitting: Orthopaedic Surgery

## 2019-03-02 VITALS — BP 129/100 | HR 91 | Temp 97.2°F | Wt 168.0 lb

## 2019-03-02 DIAGNOSIS — F17210 Nicotine dependence, cigarettes, uncomplicated: Secondary | ICD-10-CM

## 2019-03-02 DIAGNOSIS — K5190 Ulcerative colitis, unspecified, without complications: Secondary | ICD-10-CM | POA: Diagnosis not present

## 2019-03-02 DIAGNOSIS — G8929 Other chronic pain: Secondary | ICD-10-CM

## 2019-03-02 DIAGNOSIS — M5441 Lumbago with sciatica, right side: Secondary | ICD-10-CM

## 2019-03-02 DIAGNOSIS — G894 Chronic pain syndrome: Secondary | ICD-10-CM

## 2019-03-02 MED ORDER — PREDNISONE 5 MG (21) PO TBPK: ORAL_TABLET | ORAL | 0 refills | Status: DC

## 2019-03-02 NOTE — Progress Notes (Signed)
Patient YN:WGNFA Vanessa Bowen, female DOB:10-20-1975, 43 y.o. OZH:[id number]  Chief Complaint  Patient presents with  . Back Problem    LBP, right leg n/t  . Knee Pain    bil knee pain,     HPI  Vanessa Bowen is a 43 y.o. female who has chronic lower back pain, chronic pain both knees, chronic pain syndrome.  She has been to pain clinic in past.  She is taking narcotics and occasional Advil. She has ulcerative colitis and cannot take many NASIDS.  She has pain running to the right lower leg at times.  Her MRI of the lumbar spine was negative in January of this year.   Body mass index is 29.76 kg/m.  ROS  Review of Systems  HENT: Negative for congestion.   Respiratory: Negative for cough and shortness of breath.   Cardiovascular: Negative for chest pain and leg swelling.  Endocrine: Positive for cold intolerance.  Musculoskeletal: Positive for arthralgias and back pain.  Allergic/Immunologic: Positive for environmental allergies.  Neurological: Positive for headaches.  Psychiatric/Behavioral: The patient is nervous/anxious.   All other systems reviewed and are negative.   All other systems reviewed and are negative.  The following is a summary of the past history medically, past history surgically, known current medicines, social history and family history.  This information is gathered electronically by the computer from prior information and documentation.  I review this each visit and have found including this information at this point in the chart is beneficial and informative.    Past Medical History:  Diagnosis Date  . Anxiety   . Arthritis   . Chronic back pain   . Colitis, ulcerative ([place])   . Depression   . Elevated blood pressure, situational   . Hypothyroidism   . Migraine     Past Surgical History:  Procedure Laterality Date  . ANKLE SURGERY    . BREAST ENHANCEMENT SURGERY    . CESAREAN SECTION    . KNEE SURGERY    . LAPAROSCOPIC BILATERAL SALPINGECTOMY   07/27/2012   Procedure: LAPAROSCOPIC BILATERAL SALPINGECTOMY;  Surgeon: Florian Buff, MD;  Location: [facility];  Service: Gynecology;  Laterality: N/A;  . OOPHORECTOMY Right 09/09/2016   Procedure: RIGHT OOPHORECTOMY;  Surgeon: Florian Buff, MD;  Location: [facility];  Service: Gynecology;  Laterality: Right;  . OVARIAN CYST REMOVAL    . VAGINAL HYSTERECTOMY N/A 09/09/2016   Procedure: HYSTERECTOMY VAGINAL;  Surgeon: Florian Buff, MD;  Location: [facility];  Service: Gynecology;  Laterality: N/A;    Family History  Problem Relation Age of Onset  . Cancer Maternal Grandmother   . Cancer Maternal Grandfather   . Alcohol abuse Maternal Grandfather   . Hypothyroidism Daughter   . Bipolar disorder Daughter   . Anxiety disorder Daughter   . Depression Mother   . Alcohol abuse Mother   . Drug abuse Sister   . Anxiety disorder Sister     Social History Social History   Tobacco Use  . Smoking status: Current Every Day Smoker    Packs/day: 0.50    Years: 15.00    Pack years: 7.50    Types: Cigarettes  . Smokeless tobacco: Never Used  Substance Use Topics  . Alcohol use: Yes    Comment: Occasionally  . Drug use: No    Allergies  Allergen Reactions  . Doxycycline Nausea And Vomiting  . Sulfonamide Derivatives Nausea And Vomiting    Current Outpatient Medications  Medication Sig Dispense Refill  .  ALPRAZolam (XANAX) 1 MG tablet Take 1 tablet (1 mg total) by mouth 3 (three) times daily as needed for anxiety. 90 tablet 2  . conjugated estrogens (PREMARIN) vaginal cream Use 1 gram nightly 30 g 12  . DULoxetine (CYMBALTA) 60 MG capsule Take 1 capsule (60 mg total) by mouth daily. Take with the 30 mg (Patient taking differently: Take 90 mg by mouth daily. Take with the 30 mg) 90 capsule 0  . Estradiol (DIVIGEL) 1 MG/GM GEL Place 1 packet onto the skin daily. 30 g 11  . furosemide (LASIX) 20 MG tablet Take 20 mg daily as needed by mouth for fluid.  2  . HYDROcodone-acetaminophen (NORCO/VICODIN)  5-325 MG tablet Take 1 tablet by mouth every 6 (six) hours as needed for moderate pain (Must last 30 days.). 60 tablet 0  . ibuprofen (ADVIL,MOTRIN) 200 MG tablet Take 400-600 mg by mouth every 8 (eight) hours as needed (for pain/headaches.).    Marland Kitchen levothyroxine (SYNTHROID) 137 MCG tablet Take 1 tablet (137 mcg total) by mouth daily before breakfast. 30 tablet 6  . tiZANidine (ZANAFLEX) 4 MG tablet Take 4-8 mg by mouth See admin instructions. 4 mg daily as needed for muscle spasms, tension, or mild anxiety & 4-8 mg at bedtime as needed for sleep/anxiety/spasms.    . traZODone (DESYREL) 100 MG tablet Take 2 tablets (200 mg total) by mouth at bedtime as needed for sleep. 90 tablet 0  . dicyclomine (BENTYL) 20 MG tablet Take 1 tablet (20 mg total) by mouth 3 (three) times daily as needed for up to 30 days for spasms. 90 tablet 0  . DULoxetine (CYMBALTA) 30 MG capsule Take 1 capsule (30 mg total) by mouth daily. Take with the 60 mg (Patient not taking: Reported on 03/02/2019) 90 capsule 0  . predniSONE (STERAPRED UNI-PAK 21 TAB) 5 MG (21) TBPK tablet Take 6 pills first day; 5 pills second day; 4 pills third day; 3 pills fourth day; 2 pills next day and 1 pill last day. 21 tablet 0  . promethazine (PHENERGAN) 25 MG tablet Take 1 tablet (25 mg total) by mouth every 6 (six) hours as needed for up to 30 days for nausea or vomiting. 90 tablet 0  . Vitamin D, Ergocalciferol, (DRISDOL) 1.25 MG (50000 UT) CAPS capsule TAKE 1 CAPSULE (50,000 UNITS TOTAL) BY MOUTH EVERY 7 (SEVEN) DAYS. (Patient not taking: Reported on 03/02/2019) 12 capsule 0   No current facility-administered medications for this visit.      Physical Exam  Blood pressure (!) 129/100, pulse 91, temperature (!) 97.2 F (36.2 C), weight 168 lb (76.2 kg), last menstrual period 07/24/2016.  Constitutional: overall normal hygiene, normal nutrition, well developed, normal grooming, normal body habitus. Assistive device:none  Musculoskeletal: gait  and station Limp none, muscle tone and strength are normal, no tremors or atrophy is present.  .  Neurological: coordination overall normal.  Deep tendon reflex/nerve stretch intact.  Sensation normal.  Cranial nerves II-XII intact.   Skin:   Normal overall no scars, lesions, ulcers or rashes. No psoriasis.  Psychiatric: Alert and oriented x 3.  Recent memory intact, remote memory unclear.  Normal mood and affect. Well groomed.  Good eye contact.  Cardiovascular: overall no swelling, no varicosities, no edema bilaterally, normal temperatures of the legs and arms, no clubbing, cyanosis and good capillary refill.  Lymphatic: palpation is normal.  Spine/Pelvis examination:  Inspection:  Overall, sacoiliac joint benign and hips nontender; without crepitus or defects.   Thoracic spine inspection:  Alignment normal without kyphosis present   Lumbar spine inspection:  Alignment  with normal lumbar lordosis, without scoliosis apparent.   Thoracic spine palpation:  without tenderness of spinal processes   Lumbar spine palpation: without tenderness of lumbar area; without tightness of lumbar muscles    Range of Motion:   Lumbar flexion, forward flexion is normal without pain or tenderness    Lumbar extension is full without pain or tenderness   Left lateral bend is normal without pain or tenderness   Right lateral bend is normal without pain or tenderness   Straight leg raising is normal  Strength & tone: normal   Stability overall normal stability  Knees have full ROM but she complains of pain.  She has no effusion.  NV intact.  All other systems reviewed and are negative   The patient has been educated about the nature of the problem(s) and counseled on treatment options.  The patient appeared to understand what I have discussed and is in agreement with it.  Encounter Diagnoses  Name Primary?  . Chronic right-sided low back pain with right-sided sciatica Yes  . Cigarette nicotine  dependence without complication   . Ulcerative colitis without complications, unspecified location ([place])   . Chronic pain syndrome     PLAN Call if any problems.  Precautions discussed.  Continue current medications. I also called in prednisone dose pack.   Return to clinic 3 months   Electronically Signed Sanjuana Kava, MD 9/10/202011:07 AM

## 2019-03-06 ENCOUNTER — Ambulatory Visit
Admission: EM | Admit: 2019-03-06 | Discharge: 2019-03-06 | Disposition: A | Discharge status: Home / Self Care | Payer: 59 | Attending: Emergency Medicine | Admitting: Emergency Medicine

## 2019-03-06 ENCOUNTER — Ambulatory Visit (INDEPENDENT_AMBULATORY_CARE_PROVIDER_SITE_OTHER): Payer: 59

## 2019-03-06 DIAGNOSIS — M25471 Effusion, right ankle: Secondary | ICD-10-CM | POA: Diagnosis not present

## 2019-03-06 DIAGNOSIS — S93401A Sprain of unspecified ligament of right ankle, initial encounter: Secondary | ICD-10-CM

## 2019-03-06 DIAGNOSIS — M25571 Pain in right ankle and joints of right foot: Secondary | ICD-10-CM

## 2019-03-06 NOTE — ED Provider Notes (Signed)
[place]   [id number] 03/06/19 Arrival Time: 9983  CC: Right foot and ankle pain  SUBJECTIVE: History from: patient. Vanessa Bowen is a 43 y.o. female complains of right foot and ankle pain that began this morning.  Denies a precipitating event or specific injury.  Localizes the pain to the top of foot and around ankle.  Describes the pain as constant and throbbing in character.  8/10.  Has tried OTC medications without relief.  Symptoms are made worse with weight-bearing, and walking.  Reports previous fracture in the past.  Complains of associated swelling and weakness.  Denies fever, chills, erythema, ecchymosis, numbness and tingling.  ROS: As per HPI.  All other pertinent ROS negative.     Past Medical History:  Diagnosis Date  . Anxiety   . Arthritis   . Chronic back pain   . Colitis, ulcerative ([place])   . Depression   . Elevated blood pressure, situational   . Hypothyroidism   . Migraine    Past Surgical History:  Procedure Laterality Date  . ANKLE SURGERY    . BREAST ENHANCEMENT SURGERY    . CESAREAN SECTION    . KNEE SURGERY    . LAPAROSCOPIC BILATERAL SALPINGECTOMY  07/27/2012   Procedure: LAPAROSCOPIC BILATERAL SALPINGECTOMY;  Surgeon: Florian Buff, MD;  Location: [facility];  Service: Gynecology;  Laterality: N/A;  . OOPHORECTOMY Right 09/09/2016   Procedure: RIGHT OOPHORECTOMY;  Surgeon: Florian Buff, MD;  Location: [facility];  Service: Gynecology;  Laterality: Right;  . OVARIAN CYST REMOVAL    . VAGINAL HYSTERECTOMY N/A 09/09/2016   Procedure: HYSTERECTOMY VAGINAL;  Surgeon: Florian Buff, MD;  Location: [facility];  Service: Gynecology;  Laterality: N/A;   Allergies  Allergen Reactions  . Doxycycline Nausea And Vomiting  . Sulfonamide Derivatives Nausea And Vomiting   No current facility-administered medications on file prior to encounter.    Current Outpatient Medications on File Prior to Encounter  Medication Sig Dispense Refill  . ALPRAZolam (XANAX) 1 MG  tablet Take 1 tablet (1 mg total) by mouth 3 (three) times daily as needed for anxiety. 90 tablet 2  . conjugated estrogens (PREMARIN) vaginal cream Use 1 gram nightly 30 g 12  . DULoxetine (CYMBALTA) 30 MG capsule TAKE 1 CAPSULE (30 MG TOTAL) BY MOUTH DAILY. TAKE WITH THE 60 MG 90 capsule 0  . DULoxetine (CYMBALTA) 60 MG capsule Take 1 capsule (60 mg total) by mouth daily. Take with the 30 mg (Patient taking differently: Take 90 mg by mouth daily. Take with the 30 mg) 90 capsule 0  . Estradiol (DIVIGEL) 1 MG/GM GEL Place 1 packet onto the skin daily. 30 g 11  . HYDROcodone-acetaminophen (NORCO/VICODIN) 5-325 MG tablet Take 1 tablet by mouth every 6 (six) hours as needed for moderate pain (Must last 30 days.). 60 tablet 0  . ibuprofen (ADVIL,MOTRIN) 200 MG tablet Take 400-600 mg by mouth every 8 (eight) hours as needed (for pain/headaches.).    Marland Kitchen levothyroxine (SYNTHROID) 137 MCG tablet Take 1 tablet (137 mcg total) by mouth daily before breakfast. 30 tablet 6  . predniSONE (STERAPRED UNI-PAK 21 TAB) 5 MG (21) TBPK tablet Take 6 pills first day; 5 pills second day; 4 pills third day; 3 pills fourth day; 2 pills next day and 1 pill last day. 21 tablet 0  . promethazine (PHENERGAN) 25 MG tablet Take 1 tablet (25 mg total) by mouth every 6 (six) hours as needed for up to 30 days for nausea  or vomiting. 90 tablet 0  . tiZANidine (ZANAFLEX) 4 MG tablet Take 4-8 mg by mouth See admin instructions. 4 mg daily as needed for muscle spasms, tension, or mild anxiety & 4-8 mg at bedtime as needed for sleep/anxiety/spasms.    . traZODone (DESYREL) 100 MG tablet Take 2 tablets (200 mg total) by mouth at bedtime as needed for sleep. 90 tablet 0  . Vitamin D, Ergocalciferol, (DRISDOL) 1.25 MG (50000 UT) CAPS capsule TAKE 1 CAPSULE (50,000 UNITS TOTAL) BY MOUTH EVERY 7 (SEVEN) DAYS. (Patient not taking: Reported on 03/02/2019) 12 capsule 0  . [DISCONTINUED] dicyclomine (BENTYL) 20 MG tablet Take 1 tablet (20 mg total) by  mouth 3 (three) times daily as needed for up to 30 days for spasms. 90 tablet 0   Social History   Socioeconomic History  . Marital status: Married    Spouse name: Not on file  . Number of children: 2  . Years of education: Not on file  . Highest education level: Not on file  Occupational History  . Not on file  Social Needs  . Financial resource strain: Not on file  . Food insecurity    Worry: Not on file    Inability: Not on file  . Transportation needs    Medical: Not on file    Non-medical: Not on file  Tobacco Use  . Smoking status: Current Every Day Smoker    Packs/day: 0.50    Years: 15.00    Pack years: 7.50    Types: Cigarettes  . Smokeless tobacco: Never Used  Substance and Sexual Activity  . Alcohol use: Yes    Comment: Occasionally  . Drug use: No  . Sexual activity: Yes    Birth control/protection: Surgical    Comment: hyst  Lifestyle  . Physical activity    Days per week: Not on file    Minutes per session: Not on file  . Stress: Not on file  Relationships  . Social Herbalist on phone: Not on file    Gets together: Not on file    Attends religious service: Not on file    Active member of club or organization: Not on file    Attends meetings of clubs or organizations: Not on file    Relationship status: Not on file  . Intimate partner violence    Fear of current or ex partner: Not on file    Emotionally abused: Not on file    Physically abused: Not on file    Forced sexual activity: Not on file  Other Topics Concern  . Not on file  Social History Narrative  . Not on file   Family History  Problem Relation Age of Onset  . Cancer Maternal Grandmother   . Cancer Maternal Grandfather   . Alcohol abuse Maternal Grandfather   . Hypothyroidism Daughter   . Bipolar disorder Daughter   . Anxiety disorder Daughter   . Depression Mother   . Alcohol abuse Mother   . Drug abuse Sister   . Anxiety disorder Sister     OBJECTIVE:   Vitals:   03/06/19 1502  BP: 111/61  Pulse: 75  Resp: 18  Temp: 98.6 F (37 C)  SpO2: 96%    General appearance: ALERT; in no acute distress.  Head: NCAT Lungs: Normal respiratory effort CV: Dorsalis pedis pulses 2+ . Cap refill < 2 seconds Musculoskeletal: Right ankle/ foot Inspection: Skin warm, dry, clear and intact without obvious erythema, effusion,  or ecchymosis.  Palpation: Diffusely TTP over anterior medial, and lateral ankle; mildly TTP over proximal dorsum of foot ROM: FROM active and passive Strength: 3/5 dorsiflexion, 3/5 plantar flexion Skin: warm and dry Neurologic: Sitting in wheelchair; Sensation intact about the upper/ lower extremities Psychological: alert and cooperative; normal mood and affect  DIAGNOSTIC STUDIES:  Dg Foot Complete Right  Result Date: 03/06/2019 CLINICAL DATA:  Pt has history of multiple injuries to right foot. States she has a cracked heel and a fracture on the medial aspect of foot. Pt denies having surgery. She states she has sprained right ankle multiple times. Pt states her pain started this morning with NKI. EXAM: RIGHT FOOT COMPLETE - 3+ VIEW COMPARISON:  01/07/2016 FINDINGS: No acute fracture.  No bone lesion. Mild sclerosis is noted along the superior margin of the calcaneus, stable from the prior exam. This may be related to remote trauma. Joints are normally spaced and aligned. Mild soft tissue swelling anterior to the ankle joint and possible ankle joint effusion. IMPRESSION: 1. No acute fracture or bone lesion. 2. Mild soft tissue swelling anterior to the ankle joint with a possible ankle joint effusion. 3. Sclerosis along the upper aspect of the calcaneus is stable from the prior exam. This may be from remote trauma. Electronically Signed   By: Lajean Manes M.D.   On: 03/06/2019 15:21     My interpretation:  X-rays negative for bony abnormalities including fracture, or dislocation.    I have reviewed the x-rays myself and the  radiologist interpretation. I am in agreement with the radiologist interpretation.     ASSESSMENT & PLAN:  1. Pain in joint involving right ankle and foot   2. Sprain of right ankle, unspecified ligament, initial encounter   3. Effusion of right ankle     No orders of the defined types were placed in this encounter.  X-rays did not show fracture or dislocation Continue conservative management of rest, ice, and elevation Cam walker placed.  Use as needed for comfort Continue with prednisone dos pak Use norco as prescribed for pain Follow up with PCP or orthopedist if symptoms persist Return or go to the ER if you have any new or worsening symptoms (fever, chills, chest pain, abdominal pain, changes in bowel or bladder habits, increased redness, swelling, symptoms do not improve despite medications, etc...)   Reviewed expectations re: course of current medical issues. Questions answered. Outlined signs and symptoms indicating need for more acute intervention. Patient verbalized understanding. After Visit Summary given.    Lestine Box, PA-C 03/06/19 1542

## 2019-03-06 NOTE — Discharge Instructions (Signed)
X-rays did not show fracture or dislocation Continue conservative management of rest, ice, and elevation Cam walker placed.  Use as needed for comfort Continue with prednisone dos pak Use norco as prescribed for pain Follow up with PCP or orthopedist if symptoms persist Return or go to the ER if you have any new or worsening symptoms (fever, chills, chest pain, abdominal pain, changes in bowel or bladder habits, increased redness, swelling, symptoms do not improve despite medications, etc...)

## 2019-03-06 NOTE — ED Triage Notes (Signed)
Pt is unaware if injury but states she had severe pain in right upper foot, swelling to top of foot noted

## 2019-03-07 ENCOUNTER — Other Ambulatory Visit: Payer: Self-pay | Admitting: Radiology

## 2019-03-07 NOTE — Telephone Encounter (Signed)
Patient called, she was told to call prior to this Thursday to ask for refill pain meds.  Hydrocodone refill requested.  [facility].   FYI patient has had an injury to right ankle.  Went to urgent care yesterday- dx with sprain, is in cam walker.  Appt made for 9/22 with you for eval, advised her to call Thurs 9/17 if worse and we could see her then.

## 2019-03-08 MED ORDER — HYDROCODONE-ACETAMINOPHEN 5-325 MG PO TABS: 1.0000 | ORAL_TABLET | Freq: Four times a day (QID) | ORAL | 0 refills | Status: AC | PRN

## 2019-03-14 ENCOUNTER — Other Ambulatory Visit: Payer: Self-pay

## 2019-03-14 ENCOUNTER — Encounter: Payer: Self-pay | Admitting: Orthopaedic Surgery

## 2019-03-14 ENCOUNTER — Ambulatory Visit: Payer: 59 | Admitting: Orthopaedic Surgery

## 2019-03-14 VITALS — BP 105/82 | HR 90 | Temp 99.1°F | Ht 63.0 in | Wt 168.0 lb

## 2019-03-14 DIAGNOSIS — G894 Chronic pain syndrome: Secondary | ICD-10-CM

## 2019-03-14 DIAGNOSIS — F17210 Nicotine dependence, cigarettes, uncomplicated: Secondary | ICD-10-CM | POA: Diagnosis not present

## 2019-03-14 DIAGNOSIS — S96911A Strain of unspecified muscle and tendon at ankle and foot level, right foot, initial encounter: Secondary | ICD-10-CM | POA: Diagnosis not present

## 2019-03-14 NOTE — Progress Notes (Signed)
Patient GY:IRSWN Vanessa Bowen, female DOB:06/26/1975, 43 y.o. IOE:[id number]  Chief Complaint  Patient presents with  . Ankle Pain    right ankle,     HPI  Vanessa Bowen is a 43 y.o. female who fell at home 03-06-2019 while putting up Halloween decorations.  She hurt her right ankle.  [facility].  They were negative for fracture.  She was given a CAM walker.  She has no other injury.  Her pain is less.     Body mass index is 29.76 kg/m.  ROS  Review of Systems  HENT: Negative for congestion.   Respiratory: Negative for cough and shortness of breath.   Cardiovascular: Negative for chest pain and leg swelling.  Endocrine: Positive for cold intolerance.  Musculoskeletal: Positive for arthralgias and back pain.  Allergic/Immunologic: Positive for environmental allergies.  Neurological: Positive for headaches.  Psychiatric/Behavioral: The patient is nervous/anxious.   All other systems reviewed and are negative.   All other systems reviewed and are negative.  The following is a summary of the past history medically, past history surgically, known current medicines, social history and family history.  This information is gathered electronically by the computer from prior information and documentation.  I review this each visit and have found including this information at this point in the chart is beneficial and informative.    Past Medical History:  Diagnosis Date  . Anxiety   . Arthritis   . Chronic back pain   . Colitis, ulcerative ([place])   . Depression   . Elevated blood pressure, situational   . Hypothyroidism   . Migraine     Past Surgical History:  Procedure Laterality Date  . ANKLE SURGERY    . BREAST ENHANCEMENT SURGERY    . CESAREAN SECTION    . KNEE SURGERY    . LAPAROSCOPIC BILATERAL SALPINGECTOMY  07/27/2012   Procedure: LAPAROSCOPIC BILATERAL SALPINGECTOMY;  Surgeon: Florian Buff, MD;  Location: [facility];  Service: Gynecology;   Laterality: N/A;  . OOPHORECTOMY Right 09/09/2016   Procedure: RIGHT OOPHORECTOMY;  Surgeon: Florian Buff, MD;  Location: [facility];  Service: Gynecology;  Laterality: Right;  . OVARIAN CYST REMOVAL    . VAGINAL HYSTERECTOMY N/A 09/09/2016   Procedure: HYSTERECTOMY VAGINAL;  Surgeon: Florian Buff, MD;  Location: [facility];  Service: Gynecology;  Laterality: N/A;    Family History  Problem Relation Age of Onset  . Cancer Maternal Grandmother   . Cancer Maternal Grandfather   . Alcohol abuse Maternal Grandfather   . Hypothyroidism Daughter   . Bipolar disorder Daughter   . Anxiety disorder Daughter   . Depression Mother   . Alcohol abuse Mother   . Drug abuse Sister   . Anxiety disorder Sister     Social History Social History   Tobacco Use  . Smoking status: Current Every Day Smoker    Packs/day: 0.50    Years: 15.00    Pack years: 7.50    Types: Cigarettes  . Smokeless tobacco: Never Used  Substance Use Topics  . Alcohol use: Yes    Comment: Occasionally  . Drug use: No    Allergies  Allergen Reactions  . Doxycycline Nausea And Vomiting  . Sulfonamide Derivatives Nausea And Vomiting    Current Outpatient Medications  Medication Sig Dispense Refill  . ALPRAZolam (XANAX) 1 MG tablet Take 1 tablet (1 mg total) by mouth 3 (three) times daily as needed for anxiety. 90 tablet 2  .  conjugated estrogens (PREMARIN) vaginal cream Use 1 gram nightly 30 g 12  . DULoxetine (CYMBALTA) 30 MG capsule TAKE 1 CAPSULE (30 MG TOTAL) BY MOUTH DAILY. TAKE WITH THE 60 MG 90 capsule 0  . DULoxetine (CYMBALTA) 60 MG capsule Take 1 capsule (60 mg total) by mouth daily. Take with the 30 mg (Patient taking differently: Take 90 mg by mouth daily. Take with the 30 mg) 90 capsule 0  . Estradiol (DIVIGEL) 1 MG/GM GEL Place 1 packet onto the skin daily. 30 g 11  . HYDROcodone-acetaminophen (NORCO/VICODIN) 5-325 MG tablet Take 1 tablet by mouth every 6 (six) hours as needed for moderate pain (Must last 30  days.). 60 tablet 0  . ibuprofen (ADVIL,MOTRIN) 200 MG tablet Take 400-600 mg by mouth every 8 (eight) hours as needed (for pain/headaches.).    Marland Kitchen levothyroxine (SYNTHROID) 137 MCG tablet Take 1 tablet (137 mcg total) by mouth daily before breakfast. 30 tablet 6  . tiZANidine (ZANAFLEX) 4 MG tablet Take 4-8 mg by mouth See admin instructions. 4 mg daily as needed for muscle spasms, tension, or mild anxiety & 4-8 mg at bedtime as needed for sleep/anxiety/spasms.    . traZODone (DESYREL) 100 MG tablet Take 2 tablets (200 mg total) by mouth at bedtime as needed for sleep. 90 tablet 0  . Vitamin D, Ergocalciferol, (DRISDOL) 1.25 MG (50000 UT) CAPS capsule TAKE 1 CAPSULE (50,000 UNITS TOTAL) BY MOUTH EVERY 7 (SEVEN) DAYS. 12 capsule 0   No current facility-administered medications for this visit.      Physical Exam  Blood pressure 105/82, pulse 90, temperature 99.1 F (37.3 C), height 5' 3"  (1.6 m), weight 168 lb (76.2 kg), last menstrual period 07/24/2016.  Constitutional: overall normal hygiene, normal nutrition, well developed, normal grooming, normal body habitus. Assistive device:CAM walker right  Musculoskeletal: gait and station Limp right, muscle tone and strength are normal, no tremors or atrophy is present.  .  Neurological: coordination overall normal.  Deep tendon reflex/nerve stretch intact.  Sensation normal.  Cranial nerves II-XII intact.   Skin:   Normal overall no scars, lesions, ulcers or rashes. No psoriasis.  Psychiatric: Alert and oriented x 3.  Recent memory intact, remote memory unclear.  Normal mood and affect. Well groomed.  Good eye contact.  Cardiovascular: overall no swelling, no varicosities, no edema bilaterally, normal temperatures of the legs and arms, no clubbing, cyanosis and good capillary refill.  Lymphatic: palpation is normal.  Right ankle has swelling laterally, pain medially, decreased ROM, NV intact.  Limp right.    All other systems reviewed and  are negative   The patient has been educated about the nature of the problem(s) and counseled on treatment options.  The patient appeared to understand what I have discussed and is in agreement with it.  Encounter Diagnoses  Name Primary?  . Strain of right ankle, initial encounter Yes  . Chronic pain syndrome   . Cigarette nicotine dependence without complication     PLAN Call if any problems.  Precautions discussed.  Continue current medications.   Return to clinic 3 weeks   Continue the CAM walker.  Contrast bath sheet of instructions given.  She has pain medicine already.  Electronically Signed Sanjuana Kava, MD 9/22/202010:11 AM

## 2019-03-27 ENCOUNTER — Other Ambulatory Visit (HOSPITAL_COMMUNITY): Payer: Self-pay | Admitting: Psychiatry

## 2019-03-29 ENCOUNTER — Other Ambulatory Visit: Payer: Self-pay | Admitting: Obstetrics & Gynecology

## 2019-04-04 ENCOUNTER — Other Ambulatory Visit: Payer: Self-pay

## 2019-04-04 ENCOUNTER — Encounter: Payer: Self-pay | Admitting: Orthopaedic Surgery

## 2019-04-04 ENCOUNTER — Ambulatory Visit: Payer: 59 | Admitting: Orthopaedic Surgery

## 2019-04-04 VITALS — BP 102/67 | HR 92 | Ht 63.0 in | Wt 168.0 lb

## 2019-04-04 DIAGNOSIS — G894 Chronic pain syndrome: Secondary | ICD-10-CM | POA: Diagnosis not present

## 2019-04-04 DIAGNOSIS — S96911A Strain of unspecified muscle and tendon at ankle and foot level, right foot, initial encounter: Secondary | ICD-10-CM

## 2019-04-04 DIAGNOSIS — F17210 Nicotine dependence, cigarettes, uncomplicated: Secondary | ICD-10-CM

## 2019-04-04 NOTE — Progress Notes (Signed)
Patient [name], female DOB:November 24, 1975, 43 y.o. VEL:[id number]  Chief Complaint  Patient presents with  . Ankle Pain    right     HPI  Vanessa Bowen is a 43 y.o. female who has right ankle pain.  She is tired of the CAM walker.  I will change to ankle brace. She has less pain and less tenderness and less swelling but still hurts.  She has no redness or weakness.   Body mass index is 29.76 kg/m.  ROS  Review of Systems  HENT: Negative for congestion.   Respiratory: Negative for cough and shortness of breath.   Cardiovascular: Negative for chest pain and leg swelling.  Endocrine: Positive for cold intolerance.  Musculoskeletal: Positive for arthralgias and back pain.  Allergic/Immunologic: Positive for environmental allergies.  Neurological: Positive for headaches.  Psychiatric/Behavioral: The patient is nervous/anxious.   All other systems reviewed and are negative.   All other systems reviewed and are negative.  The following is a summary of the past history medically, past history surgically, known current medicines, social history and family history.  This information is gathered electronically by the computer from prior information and documentation.  I review this each visit and have found including this information at this point in the chart is beneficial and informative.    Past Medical History:  Diagnosis Date  . Anxiety   . Arthritis   . Chronic back pain   . Colitis, ulcerative ([place])   . Depression   . Elevated blood pressure, situational   . Hypothyroidism   . Migraine     Past Surgical History:  Procedure Laterality Date  . ANKLE SURGERY    . BREAST ENHANCEMENT SURGERY    . CESAREAN SECTION    . KNEE SURGERY    . LAPAROSCOPIC BILATERAL SALPINGECTOMY  07/27/2012   Procedure: LAPAROSCOPIC BILATERAL SALPINGECTOMY;  Surgeon: Vanessa Buff, MD;  Location: [facility];  Service: Gynecology;  Laterality: N/A;  . OOPHORECTOMY Right 09/09/2016   Procedure: RIGHT  OOPHORECTOMY;  Surgeon: Vanessa Buff, MD;  Location: [facility];  Service: Gynecology;  Laterality: Right;  . OVARIAN CYST REMOVAL    . VAGINAL HYSTERECTOMY N/A 09/09/2016   Procedure: HYSTERECTOMY VAGINAL;  Surgeon: Vanessa Buff, MD;  Location: [facility];  Service: Gynecology;  Laterality: N/A;    Family History  Problem Relation Age of Onset  . Cancer Maternal Grandmother   . Cancer Maternal Grandfather   . Alcohol abuse Maternal Grandfather   . Hypothyroidism Daughter   . Bipolar disorder Daughter   . Anxiety disorder Daughter   . Depression Mother   . Alcohol abuse Mother   . Drug abuse Sister   . Anxiety disorder Sister     Social History Social History   Tobacco Use  . Smoking status: Current Every Day Smoker    Packs/day: 0.50    Years: 15.00    Pack years: 7.50    Types: Cigarettes  . Smokeless tobacco: Never Used  Substance Use Topics  . Alcohol use: Yes    Comment: Occasionally  . Drug use: No    Allergies  Allergen Reactions  . Doxycycline Nausea And Vomiting  . Sulfonamide Derivatives Nausea And Vomiting    Current Outpatient Medications  Medication Sig Dispense Refill  . ALPRAZolam (XANAX) 1 MG tablet Take 1 tablet (1 mg total) by mouth 3 (three) times daily as needed for anxiety. 90 tablet 2  . conjugated estrogens (PREMARIN) vaginal cream USE 1 GRAM NIGHTLY AS DIRECTED  90 g 4  . DIVIGEL 1 MG/GM GEL PLACE 1 PACKET ONTO THE SKIN DAILY. 1 g 3  . DULoxetine (CYMBALTA) 30 MG capsule TAKE 1 CAPSULE (30 MG TOTAL) BY MOUTH DAILY. TAKE WITH THE 60 MG 90 capsule 0  . DULoxetine (CYMBALTA) 60 MG capsule Take 1 capsule (60 mg total) by mouth daily. Take with the 30 mg (Patient taking differently: Take 90 mg by mouth daily. Take with the 30 mg) 90 capsule 0  . HYDROcodone-acetaminophen (NORCO/VICODIN) 5-325 MG tablet Take 1 tablet by mouth every 6 (six) hours as needed for moderate pain (Must last 30 days.). 60 tablet 0  . ibuprofen (ADVIL,MOTRIN) 200 MG tablet Take  400-600 mg by mouth every 8 (eight) hours as needed (for pain/headaches.).    Marland Kitchen levothyroxine (SYNTHROID) 137 MCG tablet Take 1 tablet (137 mcg total) by mouth daily before breakfast. 30 tablet 6  . tiZANidine (ZANAFLEX) 4 MG tablet Take 4-8 mg by mouth See admin instructions. 4 mg daily as needed for muscle spasms, tension, or mild anxiety & 4-8 mg at bedtime as needed for sleep/anxiety/spasms.    . traZODone (DESYREL) 100 MG tablet TAKE 2 TABLETS (200 MG TOTAL) BY MOUTH AT BEDTIME AS NEEDED FOR SLEEP. 60 tablet 1  . Vitamin D, Ergocalciferol, (DRISDOL) 1.25 MG (50000 UT) CAPS capsule TAKE 1 CAPSULE (50,000 UNITS TOTAL) BY MOUTH EVERY 7 (SEVEN) DAYS. 12 capsule 0   No current facility-administered medications for this visit.      Physical Exam  Blood pressure 102/67, pulse 92, height 5' 3"  (1.6 m), weight 168 lb (76.2 kg), last menstrual period 07/24/2016.  Constitutional: overall normal hygiene, normal nutrition, well developed, normal grooming, normal body habitus. Assistive device:CAM walker  Musculoskeletal: gait and station Limp right, muscle tone and strength are normal, no tremors or atrophy is present.  .  Neurological: coordination overall normal.  Deep tendon reflex/nerve stretch intact.  Sensation normal.  Cranial nerves II-XII intact.   Skin:   Normal overall no scars, lesions, ulcers or rashes. No psoriasis.  Psychiatric: Alert and oriented x 3.  Recent memory intact, remote memory unclear.  Normal mood and affect. Well groomed.  Good eye contact.  Cardiovascular: overall no swelling, no varicosities, no edema bilaterally, normal temperatures of the legs and arms, no clubbing, cyanosis and good capillary refill.  Lymphatic: palpation is normal.  Right ankle with tenderness laterally, slight swelling, pain over anterior talofibular ligament.  NV intact.  ROM is good.  All other systems reviewed and are negative   The patient has been educated about the nature of the  problem(s) and counseled on treatment options.  The patient appeared to understand what I have discussed and is in agreement with it.  Encounter Diagnoses  Name Primary?  . Strain of right ankle, initial encounter Yes  . Chronic pain syndrome   . Cigarette nicotine dependence without complication     PLAN Call if any problems.  Precautions discussed.  Continue current medications.   Return to clinic 2 weeks  Ankle brace given.  Electronically Signed Sanjuana Kava, MD 10/13/202011:38 AM

## 2019-04-10 ENCOUNTER — Telehealth: Payer: Self-pay

## 2019-04-10 MED ORDER — HYDROCODONE-ACETAMINOPHEN 5-325 MG PO TABS: 1.0000 | ORAL_TABLET | Freq: Four times a day (QID) | ORAL | 0 refills | Status: DC | PRN

## 2019-04-10 NOTE — Telephone Encounter (Signed)
Hydrocodone-Acetaminophen  5/325 mg  Qty 60 Tablets  PATIENT USES [facility]

## 2019-04-13 LAB — TSH: TSH: 0.06 mIU/L — ABNORMAL LOW

## 2019-04-13 LAB — T4, FREE: Free T4: 1.2 ng/dL (ref 0.8–1.8)

## 2019-04-18 ENCOUNTER — Ambulatory Visit: Payer: Medicaid Other | Admitting: Orthopaedic Surgery

## 2019-04-18 ENCOUNTER — Encounter: Payer: Self-pay | Admitting: Orthopaedic Surgery

## 2019-04-20 ENCOUNTER — Other Ambulatory Visit: Payer: Self-pay

## 2019-04-20 ENCOUNTER — Ambulatory Visit (INDEPENDENT_AMBULATORY_CARE_PROVIDER_SITE_OTHER): Payer: 59 | Admitting: "Endocrinology

## 2019-04-20 ENCOUNTER — Encounter: Payer: Self-pay | Admitting: "Endocrinology

## 2019-04-20 DIAGNOSIS — E038 Other specified hypothyroidism: Secondary | ICD-10-CM

## 2019-04-20 MED ORDER — LEVOTHYROXINE SODIUM 125 MCG PO TABS: 125.0000 ug | ORAL_TABLET | Freq: Every day | ORAL | 3 refills | Status: DC

## 2019-04-20 NOTE — Progress Notes (Signed)
04/20/2019                             Endocrinology Telehealth Visit Follow up Note -During COVID -19 Pandemic  I connected with the patient on 04/20/2019   by telephone and verified that I am speaking with the correct person using two identifiers. Vanessa Bowen, 1976-04-05. she has verbally consented to this visit. All issues noted in this document were discussed and addressed. The format was not optimal for physical exam.    Subjective:    Patient ID: Vanessa Bowen, female    DOB: 1975/08/30, PCP Redmond School, MD   Past Medical History:  Diagnosis Date  . Anxiety   . Arthritis   . Chronic back pain   . Colitis, ulcerative ([place])   . Depression   . Elevated blood pressure, situational   . Hypothyroidism   . Migraine    Past Surgical History:  Procedure Laterality Date  . ANKLE SURGERY    . BREAST ENHANCEMENT SURGERY    . CESAREAN SECTION    . KNEE SURGERY    . LAPAROSCOPIC BILATERAL SALPINGECTOMY  07/27/2012   Procedure: LAPAROSCOPIC BILATERAL SALPINGECTOMY;  Surgeon: Florian Buff, MD;  Location: [facility];  Service: Gynecology;  Laterality: N/A;  . OOPHORECTOMY Right 09/09/2016   Procedure: RIGHT OOPHORECTOMY;  Surgeon: Florian Buff, MD;  Location: [facility];  Service: Gynecology;  Laterality: Right;  . OVARIAN CYST REMOVAL    . VAGINAL HYSTERECTOMY N/A 09/09/2016   Procedure: HYSTERECTOMY VAGINAL;  Surgeon: Florian Buff, MD;  Location: [facility];  Service: Gynecology;  Laterality: N/A;   Social History   Socioeconomic History  . Marital status: Married    Spouse name: Not on file  . Number of children: 2  . Years of education: Not on file  . Highest education level: Not on file  Occupational History  . Not on file  Social Needs  . Financial resource strain: Not on file  . Food insecurity    Worry: Not on file    Inability: Not on file  . Transportation needs    Medical: Not on file    Non-medical: Not on file  Tobacco Use  . Smoking status: Current Every Day  Smoker    Packs/day: 0.50    Years: 15.00    Pack years: 7.50    Types: Cigarettes  . Smokeless tobacco: Never Used  Substance and Sexual Activity  . Alcohol use: Yes    Comment: Occasionally  . Drug use: No  . Sexual activity: Yes    Birth control/protection: Surgical    Comment: hyst  Lifestyle  . Physical activity    Days per week: Not on file    Minutes per session: Not on file  . Stress: Not on file  Relationships  . Social Herbalist on phone: Not on file    Gets together: Not on file    Attends religious service: Not on file    Active member of club or organization: Not on file    Attends meetings of clubs or organizations: Not on file    Relationship status: Not on file  Other Topics Concern  . Not on file  Social History Narrative  . Not on file   Outpatient Encounter Medications as of 04/20/2019  Medication Sig  . ALPRAZolam (XANAX) 1 MG tablet Take 1 tablet (1 mg total) by mouth 3 (  three) times daily as needed for anxiety.  . conjugated estrogens (PREMARIN) vaginal cream USE 1 GRAM NIGHTLY AS DIRECTED  . DIVIGEL 1 MG/GM GEL PLACE 1 PACKET ONTO THE SKIN DAILY.  . DULoxetine (CYMBALTA) 30 MG capsule TAKE 1 CAPSULE (30 MG TOTAL) BY MOUTH DAILY. TAKE WITH THE 60 MG  . DULoxetine (CYMBALTA) 60 MG capsule Take 1 capsule (60 mg total) by mouth daily. Take with the 30 mg (Patient taking differently: Take 90 mg by mouth daily. Take with the 30 mg)  . HYDROcodone-acetaminophen (NORCO/VICODIN) 5-325 MG tablet Take 1 tablet by mouth every 6 (six) hours as needed for moderate pain (Must last 30 days.).  Marland Kitchen ibuprofen (ADVIL,MOTRIN) 200 MG tablet Take 400-600 mg by mouth every 8 (eight) hours as needed (for pain/headaches.).  Marland Kitchen levothyroxine (SYNTHROID) 125 MCG tablet Take 1 tablet (125 mcg total) by mouth daily before breakfast.  . tiZANidine (ZANAFLEX) 4 MG tablet Take 4-8 mg by mouth See admin instructions. 4 mg daily as needed for muscle spasms, tension, or mild  anxiety & 4-8 mg at bedtime as needed for sleep/anxiety/spasms.  . traZODone (DESYREL) 100 MG tablet TAKE 2 TABLETS (200 MG TOTAL) BY MOUTH AT BEDTIME AS NEEDED FOR SLEEP.  . Vitamin D, Ergocalciferol, (DRISDOL) 1.25 MG (50000 UT) CAPS capsule TAKE 1 CAPSULE (50,000 UNITS TOTAL) BY MOUTH EVERY 7 (SEVEN) DAYS.  . [DISCONTINUED] dicyclomine (BENTYL) 20 MG tablet Take 1 tablet (20 mg total) by mouth 3 (three) times daily as needed for up to 30 days for spasms.  . [DISCONTINUED] levothyroxine (SYNTHROID) 137 MCG tablet Take 1 tablet (137 mcg total) by mouth daily before breakfast.   No facility-administered encounter medications on file as of 04/20/2019.    ALLERGIES: Allergies  Allergen Reactions  . Doxycycline Nausea And Vomiting  . Sulfonamide Derivatives Nausea And Vomiting   VACCINATION STATUS:  There is no immunization history on file for this patient.  HPI  43 yr old female with medical hx as follows.  She is being engaged in telehealth for follow-up of her longstanding hypothyroidism.  She has hypothyroidism diagnosed at approximate age of 27 years.  She is currently on levothyroxine 137 mcg p.o. every morning.  She  reports better consistency taking her medication at this time.  She has no new complaints today. She continues to lose weight.   Review of Systems   Limited as above.  Objective:    LMP 07/24/2016   Wt Readings from Last 3 Encounters:  04/04/19 168 lb (76.2 kg)  03/14/19 168 lb (76.2 kg)  03/02/19 168 lb (76.2 kg)    Physical Exam    CMP     Component Value Date/Time   NA 135 03/12/2018 1343   K 4.3 03/12/2018 1343   CL 102 03/12/2018 1343   CO2 20 (L) 03/12/2018 1343   GLUCOSE 103 (H) 03/12/2018 1343   BUN 18 03/12/2018 1343   CREATININE 0.89 03/12/2018 1343   CREATININE 1.05 04/27/2017 1226   CALCIUM 9.8 03/12/2018 1343   PROT 8.0 03/12/2018 1343   ALBUMIN 4.4 03/12/2018 1343   AST 18 03/12/2018 1343   ALT 21 03/12/2018 1343   ALKPHOS 78  03/12/2018 1343   BILITOT 0.4 03/12/2018 1343   GFRNONAA >60 03/12/2018 1343   GFRAA >60 03/12/2018 1343   Recent Results (from the past 2160 hour(s))  T4, free     Status: None   Collection Time: 04/12/19  3:07 PM  Result Value Ref Range   Free T4 1.2  0.8 - 1.8 ng/dL  TSH     Status: Abnormal   Collection Time: 04/12/19  3:07 PM  Result Value Ref Range   TSH 0.06 (L) mIU/L    Comment:           Reference Range .           > or = 20 Years  0.40-4.50 .                Pregnancy Ranges           First trimester    0.26-2.66           Second trimester   0.55-2.73           Third trimester    0.43-2.91     Assessment & Plan:   1. hypothyroidism -Her previsit labs show over-replacement . She is approached for a lower dose of levothyroxine to 125 mcg po qam.   - We discussed about the correct intake of her thyroid hormone, on empty stomach at fasting, with water, separated by at least 30 minutes from breakfast and other medications,  and separated by more than 4 hours from calcium, iron, multivitamins, acid reflux medications (PPIs). -Patient is made aware of the fact that thyroid hormone replacement is needed for life, dose to be adjusted by periodic monitoring of thyroid function tests.     - I advised patient to maintain close follow up with Redmond School, MD for primary care needs.  Time for this visit: 15 minutes. Vanessa Bowen  participated in the discussions, expressed understanding, and voiced agreement with the above plans.  All questions were answered to her satisfaction. she is encouraged to contact clinic should she have any questions or concerns prior to her return visit.   Follow up plan: Return in about 4 months (around 08/20/2019) for Follow up with Pre-visit Labs.  Glade Lloyd, MD Phone: [phone]  Fax: [phone]  -  This note was partially dictated with voice recognition software. Similar sounding words can be transcribed inadequately or may not  be  corrected upon review.  04/20/2019, 7:34 PM

## 2019-04-26 ENCOUNTER — Ambulatory Visit (INDEPENDENT_AMBULATORY_CARE_PROVIDER_SITE_OTHER): Payer: 59 | Admitting: Psychiatry

## 2019-04-26 ENCOUNTER — Other Ambulatory Visit: Payer: Self-pay

## 2019-04-26 DIAGNOSIS — F411 Generalized anxiety disorder: Secondary | ICD-10-CM

## 2019-04-26 DIAGNOSIS — F325 Major depressive disorder, single episode, in full remission: Secondary | ICD-10-CM | POA: Diagnosis not present

## 2019-04-26 DIAGNOSIS — F410 Panic disorder [episodic paroxysmal anxiety] without agoraphobia: Secondary | ICD-10-CM

## 2019-04-26 MED ORDER — DULOXETINE HCL 30 MG PO CPEP: 90.0000 mg | ORAL_CAPSULE | Freq: Every day | ORAL | 0 refills | Status: DC

## 2019-04-26 MED ORDER — ALPRAZOLAM 1 MG PO TABS: 1.0000 mg | ORAL_TABLET | Freq: Three times a day (TID) | ORAL | 1 refills | Status: AC | PRN

## 2019-04-26 NOTE — Progress Notes (Signed)
[place] MD/PA/NP OP Progress Note  04/26/2019 11:14 AM Vanessa Bowen  MRN:  [id number] Interview was conducted by phone and I verified that I was speaking with the correct person using two identifiers. I discussed the limitations of evaluation and management by telemedicine and  the availability of in person appointments. Patient expressed understanding and agreed to proceed.  Chief Complaint: Anxiety.  HPI: 43yo married female with panic disorder, GAD and MDD.She has beendepressed, anxious and having more problems with sleep due to stress related to corona virus. Shereported that alprazolamworks better than clonazepamdid so we changed it back.We increased duloxetine and trazodone doses. Vanessa Bowen now reports feeling less depressed but is still anxious - few "close calls" among family memeners with COVID. She does not work at this time. Sleep adequate.  Alprazolam used as needed. No SI, normal appetite, no psychosis. Pain better controlled as she is back on hydrocodone/APAP.  Visit Diagnosis:    ICD-10-CM   1. GAD (generalized anxiety disorder)  F41.1   2. Major depressive disorder with single episode, in full remission ([place])  F32.5   3. Panic disorder  F41.0     Past Psychiatric History: Please see inatke H&P.  Past Medical History:  Past Medical History:  Diagnosis Date  . Anxiety   . Arthritis   . Chronic back pain   . Colitis, ulcerative ([place])   . Depression   . Elevated blood pressure, situational   . Hypothyroidism   . Migraine     Past Surgical History:  Procedure Laterality Date  . ANKLE SURGERY    . BREAST ENHANCEMENT SURGERY    . CESAREAN SECTION    . KNEE SURGERY    . LAPAROSCOPIC BILATERAL SALPINGECTOMY  07/27/2012   Procedure: LAPAROSCOPIC BILATERAL SALPINGECTOMY;  Surgeon: Florian Buff, MD;  Location: [facility];  Service: Gynecology;  Laterality: N/A;  . OOPHORECTOMY Right 09/09/2016   Procedure: RIGHT OOPHORECTOMY;  Surgeon: Florian Buff, MD;  Location: [facility];  Service: Gynecology;  Laterality: Right;  . OVARIAN CYST REMOVAL    . VAGINAL HYSTERECTOMY N/A 09/09/2016   Procedure: HYSTERECTOMY VAGINAL;  Surgeon: Florian Buff, MD;  Location: [facility];  Service: Gynecology;  Laterality: N/A;    Family Psychiatric History: Reviewed.  Family History:  Family History  Problem Relation Age of Onset  . Cancer Maternal Grandmother   . Cancer Maternal Grandfather   . Alcohol abuse Maternal Grandfather   . Hypothyroidism Daughter   . Bipolar disorder Daughter   . Anxiety disorder Daughter   . Depression Mother   . Alcohol abuse Mother   . Drug abuse Sister   . Anxiety disorder Sister     Social History:  Social History   Socioeconomic History  . Marital status: Married    Spouse name: Not on file  . Number of children: 2  . Years of education: Not on file  . Highest education level: Not on file  Occupational History  . Not on file  Social Needs  . Financial resource strain: Not on file  . Food insecurity    Worry: Not on file    Inability: Not on file  . Transportation needs    Medical: Not on file    Non-medical: Not on file  Tobacco Use  . Smoking status: Current Every Day Smoker    Packs/day: 0.50    Years: 15.00    Pack years: 7.50    Types: Cigarettes  . Smokeless tobacco: Never Used  Substance and Sexual  Activity  . Alcohol use: Yes    Comment: Occasionally  . Drug use: No  . Sexual activity: Yes    Birth control/protection: Surgical    Comment: hyst  Lifestyle  . Physical activity    Days per week: Not on file    Minutes per session: Not on file  . Stress: Not on file  Relationships  . Social Herbalist on phone: Not on file    Gets together: Not on file    Attends religious service: Not on file    Active member of club or organization: Not on file    Attends meetings of clubs or organizations: Not on file    Relationship status: Not on file  Other Topics Concern  . Not on file  Social History  Narrative  . Not on file    Allergies:  Allergies  Allergen Reactions  . Doxycycline Nausea And Vomiting  . Sulfonamide Derivatives Nausea And Vomiting    Metabolic Disorder Labs: Lab Results  Component Value Date   HGBA1C 5.2 04/27/2017   MPG 103 04/27/2017   MPG 100 07/21/2016   No results found for: PROLACTIN No results found for: CHOL, TRIG, HDL, CHOLHDL, VLDL, LDLCALC Lab Results  Component Value Date   TSH 0.06 (L) 04/12/2019   TSH 0.03 (L) 10/11/2018    Therapeutic Level Labs: No results found for: LITHIUM No results found for: VALPROATE No components found for:  CBMZ  Current Medications: Current Outpatient Medications  Medication Sig Dispense Refill  . ALPRAZolam (XANAX) 1 MG tablet Take 1 tablet (1 mg total) by mouth 3 (three) times daily as needed for anxiety. 90 tablet 1  . conjugated estrogens (PREMARIN) vaginal cream USE 1 GRAM NIGHTLY AS DIRECTED 90 g 4  . DIVIGEL 1 MG/GM GEL PLACE 1 PACKET ONTO THE SKIN DAILY. 1 g 3  . DULoxetine (CYMBALTA) 30 MG capsule Take 3 capsules (90 mg total) by mouth daily. Take with the 60 mg 270 capsule 0  . HYDROcodone-acetaminophen (NORCO/VICODIN) 5-325 MG tablet Take 1 tablet by mouth every 6 (six) hours as needed for moderate pain (Must last 30 days.). 60 tablet 0  . ibuprofen (ADVIL,MOTRIN) 200 MG tablet Take 400-600 mg by mouth every 8 (eight) hours as needed (for pain/headaches.).    Marland Kitchen levothyroxine (SYNTHROID) 125 MCG tablet Take 1 tablet (125 mcg total) by mouth daily before breakfast. 30 tablet 3  . tiZANidine (ZANAFLEX) 4 MG tablet Take 4-8 mg by mouth See admin instructions. 4 mg daily as needed for muscle spasms, tension, or mild anxiety & 4-8 mg at bedtime as needed for sleep/anxiety/spasms.    . traZODone (DESYREL) 100 MG tablet TAKE 2 TABLETS (200 MG TOTAL) BY MOUTH AT BEDTIME AS NEEDED FOR SLEEP. 60 tablet 1  . Vitamin D, Ergocalciferol, (DRISDOL) 1.25 MG (50000 UT) CAPS capsule TAKE 1 CAPSULE (50,000 UNITS TOTAL)  BY MOUTH EVERY 7 (SEVEN) DAYS. 12 capsule 0   No current facility-administered medications for this visit.      Psychiatric Specialty Exam: Review of Systems  Musculoskeletal: Positive for back pain.  Psychiatric/Behavioral: The patient is nervous/anxious.   All other systems reviewed and are negative.   Last menstrual period 07/24/2016.There is no height or weight on file to calculate BMI.  General Appearance: NA  Eye Contact:  NA  Speech:  Clear and Coherent and Normal Rate  Volume:  Normal  Mood:  Anxious  Affect:  NA  Thought Process:  Goal Directed and Linear  Orientation:  Full (Time, Place, and Person)  Thought Content: Rumination   Suicidal Thoughts:  No  Homicidal Thoughts:  No  Memory:  Immediate;   Good Recent;   Good Remote;   Good  Judgement:  Good  Insight:  Good  Psychomotor Activity:  NA  Concentration:  Concentration: Fair  Recall:  Good  Fund of Knowledge: Good  Language: Good  Akathisia:  Negative  Handed:  Right  AIMS (if indicated): not done  Assets:  Communication Skills Desire for Improvement Housing Resilience Talents/Skills  ADL's:  Intact  Cognition: WNL  Sleep:  Fair   Screenings: PHQ2-9     Office Visit from 03/18/2016 in [facility]  PHQ-2 Total Score  0       Assessment and Plan: 43yo married female with panic disorder, GAD and MDD.She has beendepressed, anxious and having more problems with sleep due to stress related to corona virus. Shereported that alprazolamworks better than clonazepamdid so we changed it back.We increased duloxetine and trazodone doses. Anyelin now reports feeling less depressed but is still anxious - few "close calls" among family memeners with COVID. She does not work at this time. Sleep adequate.  Alprazolam used as needed. No SI, normal appetite, no psychosis. Pain better controlled as she is back on hydrocodone/APAP.  Plan:Continueduloxetine to 90 mg,alprazolam  1 mg tid prn anxiety and trazodone 200 mg at HS for sleep.The plan was discussed with patient who had an opportunity to ask questions and these were all answered. I spend25 minutes inphone contact with patient. Next appointment in 2 months.    Stephanie Acre, MD 04/26/2019, 11:14 AM

## 2019-05-02 ENCOUNTER — Telehealth (HOSPITAL_COMMUNITY): Payer: Self-pay

## 2019-05-02 NOTE — Telephone Encounter (Signed)
[employer] PRESCRIPTION COVERAGE APPROVED   DULOXETINE 30MG CAPSULE REFERENCE# [id number] EFFECTIVE 05/02/2019 TO 05/01/2020

## 2019-05-09 ENCOUNTER — Other Ambulatory Visit: Payer: Self-pay | Admitting: Orthopaedic Surgery

## 2019-05-09 MED ORDER — HYDROCODONE-ACETAMINOPHEN 5-325 MG PO TABS: 1.0000 | ORAL_TABLET | Freq: Four times a day (QID) | ORAL | 0 refills | Status: DC | PRN

## 2019-05-09 NOTE — Telephone Encounter (Signed)
Done

## 2019-05-09 NOTE — Telephone Encounter (Signed)
Patient called for refill:  ([facility] in [place]) HYDROcodone-acetaminophen (NORCO/VICODIN) 5-325 MG tablet 60 tablet

## 2019-05-30 ENCOUNTER — Other Ambulatory Visit: Payer: Self-pay

## 2019-05-30 ENCOUNTER — Ambulatory Visit (INDEPENDENT_AMBULATORY_CARE_PROVIDER_SITE_OTHER): Payer: 59 | Admitting: Orthopaedic Surgery

## 2019-05-30 ENCOUNTER — Encounter: Payer: Self-pay | Admitting: Orthopaedic Surgery

## 2019-05-30 VITALS — BP 106/86 | HR 88 | Ht 63.0 in | Wt 168.0 lb

## 2019-05-30 DIAGNOSIS — G894 Chronic pain syndrome: Secondary | ICD-10-CM

## 2019-05-30 DIAGNOSIS — F17210 Nicotine dependence, cigarettes, uncomplicated: Secondary | ICD-10-CM | POA: Diagnosis not present

## 2019-05-30 DIAGNOSIS — S96911A Strain of unspecified muscle and tendon at ankle and foot level, right foot, initial encounter: Secondary | ICD-10-CM | POA: Diagnosis not present

## 2019-05-30 NOTE — Patient Instructions (Signed)
Steps to Quit Smoking Smoking tobacco is the leading cause of preventable death. It can affect almost every organ in the body. Smoking puts you and people around you at risk for many serious, long-lasting (chronic) diseases. Quitting smoking can be hard, but it is one of the best things that you can do for your health. It is never too late to quit. How do I get ready to quit? When you decide to quit smoking, make a plan to help you succeed. Before you quit:  Pick a date to quit. Set a date within the next 2 weeks to give you time to prepare.  Write down the reasons why you are quitting. Keep this list in places where you will see it often.  Tell your family, friends, and co-workers that you are quitting. Their support is important.  Talk with your doctor about the choices that may help you quit.  Find out if your health insurance will pay for these treatments.  Know the people, places, things, and activities that make you want to smoke (triggers). Avoid them. What first steps can I take to quit smoking?  Throw away all cigarettes at home, at work, and in your car.  Throw away the things that you use when you smoke, such as ashtrays and lighters.  Clean your car. Make sure to empty the ashtray.  Clean your home, including curtains and carpets. What can I do to help me quit smoking? Talk with your doctor about taking medicines and seeing a counselor at the same time. You are more likely to succeed when you do both.  If you are pregnant or breastfeeding, talk with your doctor about counseling or other ways to quit smoking. Do not take medicine to help you quit smoking unless your doctor tells you to do so. To quit smoking: Quit right away  Quit smoking totally, instead of slowly cutting back on how much you smoke over a period of time.  Go to counseling. You are more likely to quit if you go to counseling sessions regularly. Take medicine You may take medicines to help you quit. Some  medicines need a prescription, and some you can buy over-the-counter. Some medicines may contain a drug called nicotine to replace the nicotine in cigarettes. Medicines may:  Help you to stop having the desire to smoke (cravings).  Help to stop the problems that come when you stop smoking (withdrawal symptoms). Your doctor may ask you to use:  Nicotine patches, gum, or lozenges.  Nicotine inhalers or sprays.  Non-nicotine medicine that is taken by mouth. Find resources Find resources and other ways to help you quit smoking and remain smoke-free after you quit. These resources are most helpful when you use them often. They include:  Online chats with a Social worker.  Phone quitlines.  Printed Furniture conservator/restorer.  Support groups or group counseling.  Text messaging programs.  Mobile phone apps. Use apps on your mobile phone or tablet that can help you stick to your quit plan. There are many free apps for mobile phones and tablets as well as websites. Examples include Quit Guide from the [employer] and smokefree.gov  What things can I do to make it easier to quit?   Talk to your family and friends. Ask them to support and encourage you.  Call a phone quitline (1-800-QUIT-NOW), reach out to support groups, or work with a Social worker.  Ask people who smoke to not smoke around you.  Avoid places that make you want to smoke,  such as: ? Bars. ? Parties. ? Smoke-break areas at work.  Spend time with people who do not smoke.  Lower the stress in your life. Stress can make you want to smoke. Try these things to help your stress: ? Getting regular exercise. ? Doing deep-breathing exercises. ? Doing yoga. ? Meditating. ? Doing a body scan. To do this, close your eyes, focus on one area of your body at a time from head to toe. Notice which parts of your body are tense. Try to relax the muscles in those areas. How will I feel when I quit smoking? Day 1 to 3 weeks Within the first 24 hours,  you may start to have some problems that come from quitting tobacco. These problems are very bad 2-3 days after you quit, but they do not often last for more than 2-3 weeks. You may get these symptoms:  Mood swings.  Feeling restless, nervous, angry, or annoyed.  Trouble concentrating.  Dizziness.  Strong desire for high-sugar foods and nicotine.  Weight gain.  Trouble pooping (constipation).  Feeling like you may vomit (nausea).  Coughing or a sore throat.  Changes in how the medicines that you take for other issues work in your body.  Depression.  Trouble sleeping (insomnia). Week 3 and afterward After the first 2-3 weeks of quitting, you may start to notice more positive results, such as:  Better sense of smell and taste.  Less coughing and sore throat.  Slower heart rate.  Lower blood pressure.  Clearer skin.  Better breathing.  Fewer sick days. Quitting smoking can be hard. Do not give up if you fail the first time. Some people need to try a few times before they succeed. Do your best to stick to your quit plan, and talk with your doctor if you have any questions or concerns. Summary  Smoking tobacco is the leading cause of preventable death. Quitting smoking can be hard, but it is one of the best things that you can do for your health.  When you decide to quit smoking, make a plan to help you succeed.  Quit smoking right away, not slowly over a period of time.  When you start quitting, seek help from your doctor, family, or friends. This information is not intended to replace advice given to you by your health care provider. Make sure you discuss any questions you have with your health care provider. Document Released: 04/04/2009 Document Revised: 08/26/2018 Document Reviewed: 08/27/2018 Elsevier Patient Education  2020 [employer].

## 2019-05-30 NOTE — Progress Notes (Signed)
Patient Vanessa Bowen, female DOB:Oct 23, 1975, 43 y.o. XIH:[id number]  Chief Complaint  Patient presents with  . Back Pain    aches with cold   . Ankle Pain    right / aches but feels better    HPI  Vanessa Bowen is a 43 y.o. female who has chronic right ankle pain.  She has good and bad days. She is using the brace.  She has chronic pain as well.  If the pain continues, she may need MRI of the ankle.  She is to continue the brace and her medicine.   Body mass index is 29.76 kg/m.  ROS  Review of Systems  HENT: Negative for congestion.   Respiratory: Negative for cough and shortness of breath.   Cardiovascular: Negative for chest pain and leg swelling.  Endocrine: Positive for cold intolerance.  Musculoskeletal: Positive for arthralgias and back pain.  Allergic/Immunologic: Positive for environmental allergies.  Neurological: Positive for headaches.  Psychiatric/Behavioral: The patient is nervous/anxious.   All other systems reviewed and are negative.   All other systems reviewed and are negative.  The following is a summary of the past history medically, past history surgically, known current medicines, social history and family history.  This information is gathered electronically by the computer from prior information and documentation.  I review this each visit and have found including this information at this point in the chart is beneficial and informative.    Past Medical History:  Diagnosis Date  . Anxiety   . Arthritis   . Chronic back pain   . Colitis, ulcerative ([place])   . Depression   . Elevated blood pressure, situational   . Hypothyroidism   . Migraine     Past Surgical History:  Procedure Laterality Date  . ANKLE SURGERY    . BREAST ENHANCEMENT SURGERY    . CESAREAN SECTION    . KNEE SURGERY    . LAPAROSCOPIC BILATERAL SALPINGECTOMY  07/27/2012   Procedure: LAPAROSCOPIC BILATERAL SALPINGECTOMY;  Surgeon: Florian Buff, MD;  Location: [facility];  Service:  Gynecology;  Laterality: N/A;  . OOPHORECTOMY Right 09/09/2016   Procedure: RIGHT OOPHORECTOMY;  Surgeon: Florian Buff, MD;  Location: [facility];  Service: Gynecology;  Laterality: Right;  . OVARIAN CYST REMOVAL    . VAGINAL HYSTERECTOMY N/A 09/09/2016   Procedure: HYSTERECTOMY VAGINAL;  Surgeon: Florian Buff, MD;  Location: [facility];  Service: Gynecology;  Laterality: N/A;    Family History  Problem Relation Age of Onset  . Cancer Maternal Grandmother   . Cancer Maternal Grandfather   . Alcohol abuse Maternal Grandfather   . Hypothyroidism Daughter   . Bipolar disorder Daughter   . Anxiety disorder Daughter   . Depression Mother   . Alcohol abuse Mother   . Drug abuse Sister   . Anxiety disorder Sister     Social History Social History   Tobacco Use  . Smoking status: Current Every Day Smoker    Packs/day: 0.50    Years: 15.00    Pack years: 7.50    Types: Cigarettes  . Smokeless tobacco: Never Used  Substance Use Topics  . Alcohol use: Yes    Comment: Occasionally  . Drug use: No    Allergies  Allergen Reactions  . Doxycycline Nausea And Vomiting  . Sulfonamide Derivatives Nausea And Vomiting    Current Outpatient Medications  Medication Sig Dispense Refill  . ALPRAZolam (XANAX) 1 MG tablet Take 1 tablet (1 mg total) by mouth 3 (three)  times daily as needed for anxiety. 90 tablet 1  . conjugated estrogens (PREMARIN) vaginal cream USE 1 GRAM NIGHTLY AS DIRECTED 90 g 4  . DIVIGEL 1 MG/GM GEL PLACE 1 PACKET ONTO THE SKIN DAILY. 1 g 3  . DULoxetine (CYMBALTA) 30 MG capsule Take 3 capsules (90 mg total) by mouth daily. Take with the 60 mg 270 capsule 0  . HYDROcodone-acetaminophen (NORCO/VICODIN) 5-325 MG tablet Take 1 tablet by mouth every 6 (six) hours as needed for moderate pain (Must last 30 days.). 55 tablet 0  . ibuprofen (ADVIL,MOTRIN) 200 MG tablet Take 400-600 mg by mouth every 8 (eight) hours as needed (for pain/headaches.).    Marland Kitchen levothyroxine (SYNTHROID) 125 MCG  tablet Take 1 tablet (125 mcg total) by mouth daily before breakfast. 30 tablet 3  . tiZANidine (ZANAFLEX) 4 MG tablet Take 4-8 mg by mouth See admin instructions. 4 mg daily as needed for muscle spasms, tension, or mild anxiety & 4-8 mg at bedtime as needed for sleep/anxiety/spasms.    . traZODone (DESYREL) 100 MG tablet TAKE 2 TABLETS (200 MG TOTAL) BY MOUTH AT BEDTIME AS NEEDED FOR SLEEP. 60 tablet 1  . Vitamin D, Ergocalciferol, (DRISDOL) 1.25 MG (50000 UT) CAPS capsule TAKE 1 CAPSULE (50,000 UNITS TOTAL) BY MOUTH EVERY 7 (SEVEN) DAYS. 12 capsule 0   No current facility-administered medications for this visit.      Physical Exam  Blood pressure 106/86, pulse 88, height 5' 3"  (1.6 m), weight 168 lb (76.2 kg), last menstrual period 07/24/2016.  Constitutional: overall normal hygiene, normal nutrition, well developed, normal grooming, normal body habitus. Assistive device:ankle brace right  Musculoskeletal: gait and station Limp right, muscle tone and strength are normal, no tremors or atrophy is present.  .  Neurological: coordination overall normal.  Deep tendon reflex/nerve stretch intact.  Sensation normal.  Cranial nerves II-XII intact.   Skin:   Normal overall no scars, lesions, ulcers or rashes. No psoriasis.  Psychiatric: Alert and oriented x 3.  Recent memory intact, remote memory unclear.  Normal mood and affect. Well groomed.  Good eye contact.  Cardiovascular: overall no swelling, no varicosities, no edema bilaterally, normal temperatures of the legs and arms, no clubbing, cyanosis and good capillary refill.  Lymphatic: palpation is normal.  Right ankle tender over the anterior talofibular ligament with some lateral swelling. ROM is full but tender.  NV intact.  Limp right.  All other systems reviewed and are negative   The patient has been educated about the nature of the problem(s) and counseled on treatment options.  The patient appeared to understand what I have  discussed and is in agreement with it.  Encounter Diagnoses  Name Primary?  . Strain of right ankle, initial encounter Yes  . Chronic pain syndrome   . Cigarette nicotine dependence without complication     PLAN Call if any problems.  Precautions discussed.  Continue current medications.   Return to clinic 1 month   Electronically Signed Sanjuana Kava, MD 12/8/202010:15 AM

## 2019-06-01 ENCOUNTER — Ambulatory Visit: Payer: 59 | Admitting: Orthopaedic Surgery

## 2019-06-06 ENCOUNTER — Other Ambulatory Visit: Payer: Self-pay | Admitting: Orthopaedic Surgery

## 2019-06-06 MED ORDER — HYDROCODONE-ACETAMINOPHEN 5-325 MG PO TABS: 1.0000 | ORAL_TABLET | Freq: Four times a day (QID) | ORAL | 0 refills | Status: DC | PRN

## 2019-06-06 NOTE — Telephone Encounter (Signed)
Patient of Dr. Brooke Bonito requests  Hydrocodone/Acetaminophen 5-325  Mgs.   Qty  55  Sig: Take 1 tablet by mouth every 6 (six) hours as needed for moderate pain (Must last 30 days.).  [facility] uses [facility] in [place]

## 2019-06-07 ENCOUNTER — Telehealth (HOSPITAL_COMMUNITY): Payer: Self-pay

## 2019-06-07 ENCOUNTER — Other Ambulatory Visit (HOSPITAL_COMMUNITY): Payer: Self-pay | Admitting: Psychiatry

## 2019-06-07 MED ORDER — TRAZODONE HCL 100 MG PO TABS: 200.0000 mg | ORAL_TABLET | Freq: Every evening | ORAL | 0 refills | Status: DC | PRN

## 2019-06-07 NOTE — Telephone Encounter (Signed)
Medication refill - Refill request from pt's [facility] for a 90 day order of her prescribed Trazodone, last ordered for 30 days 03/27/19 + 1 refill. Patient returns on 06/28/2019.

## 2019-06-07 NOTE — Telephone Encounter (Signed)
I entered a new 90 day order for trazodone.

## 2019-06-28 ENCOUNTER — Other Ambulatory Visit: Payer: Self-pay

## 2019-06-28 ENCOUNTER — Ambulatory Visit (INDEPENDENT_AMBULATORY_CARE_PROVIDER_SITE_OTHER): Payer: 59 | Admitting: Psychiatry

## 2019-06-28 DIAGNOSIS — F410 Panic disorder [episodic paroxysmal anxiety] without agoraphobia: Secondary | ICD-10-CM

## 2019-06-28 DIAGNOSIS — F331 Major depressive disorder, recurrent, moderate: Secondary | ICD-10-CM

## 2019-06-28 DIAGNOSIS — F411 Generalized anxiety disorder: Secondary | ICD-10-CM | POA: Diagnosis not present

## 2019-06-28 MED ORDER — ALPRAZOLAM 1 MG PO TABS: 1.0000 mg | ORAL_TABLET | Freq: Three times a day (TID) | ORAL | 5 refills | Status: DC | PRN

## 2019-06-28 MED ORDER — DULOXETINE HCL 60 MG PO CPEP: 60.0000 mg | ORAL_CAPSULE | Freq: Two times a day (BID) | ORAL | 1 refills | Status: DC

## 2019-06-28 NOTE — Progress Notes (Signed)
[facility] MD/PA/NP OP Progress Note  06/28/2019 10:47 AM Vanessa Bowen  MRN:  [id number] Interview was conducted by phone and I verified that I was speaking with the correct person using two identifiers. I discussed the limitations of evaluation and management by telemedicine and  the availability of in person appointments. Patient expressed understanding and agreed to proceed.  Chief Complaint: Panic attacks daily.  HPI: 44yo married female with panic disorder, GAD and MDD.She has beendepressed,anxious and having more problems with sleep due to stress related to [place]. Shereportedthat alprazolamworks better than clonazepamdidso we changed it back.We increased duloxetine and trazodone doses. Vanessa Bowen now reports feeling less depressed but is still anxious - few "close calls" among family memeners with COVID. She does not work at this time. Sleep adequate.  Alprazolam used as needed but she takes it daily several times splitting tablets in half. No SI, normal appetite, no psychosis.Chronic back pain better controlled as she is back on hydrocodone/APAP.   Visit Diagnosis:    ICD-10-CM   1. Panic disorder  F41.0   2. GAD (generalized anxiety disorder)  F41.1   3. Moderate episode of recurrent major depressive disorder (HCC)  F33.1     Past Psychiatric History: Please see intake H&P.  Past Medical History:  Past Medical History:  Diagnosis Date  . Anxiety   . Arthritis   . Chronic back pain   . Colitis, ulcerative ([place])   . Depression   . Elevated blood pressure, situational   . Hypothyroidism   . Migraine     Past Surgical History:  Procedure Laterality Date  . ANKLE SURGERY    . BREAST ENHANCEMENT SURGERY    . CESAREAN SECTION    . KNEE SURGERY    . LAPAROSCOPIC BILATERAL SALPINGECTOMY  07/27/2012   Procedure: LAPAROSCOPIC BILATERAL SALPINGECTOMY;  Surgeon: Florian Buff, MD;  Location: [facility];  Service: Gynecology;  Laterality: N/A;  . OOPHORECTOMY Right 09/09/2016   Procedure:  RIGHT OOPHORECTOMY;  Surgeon: Florian Buff, MD;  Location: [facility];  Service: Gynecology;  Laterality: Right;  . OVARIAN CYST REMOVAL    . VAGINAL HYSTERECTOMY N/A 09/09/2016   Procedure: HYSTERECTOMY VAGINAL;  Surgeon: Florian Buff, MD;  Location: [facility];  Service: Gynecology;  Laterality: N/A;    Family Psychiatric History: Reviewed.  Family History:  Family History  Problem Relation Age of Onset  . Cancer Maternal Grandmother   . Cancer Maternal Grandfather   . Alcohol abuse Maternal Grandfather   . Hypothyroidism Daughter   . Bipolar disorder Daughter   . Anxiety disorder Daughter   . Depression Mother   . Alcohol abuse Mother   . Drug abuse Sister   . Anxiety disorder Sister     Social History:  Social History   Socioeconomic History  . Marital status: Married    Spouse name: Not on file  . Number of children: 2  . Years of education: Not on file  . Highest education level: Not on file  Occupational History  . Not on file  Tobacco Use  . Smoking status: Current Every Day Smoker    Packs/day: 0.50    Years: 15.00    Pack years: 7.50    Types: Cigarettes  . Smokeless tobacco: Never Used  Substance and Sexual Activity  . Alcohol use: Yes    Comment: Occasionally  . Drug use: No  . Sexual activity: Yes    Birth control/protection: Surgical    Comment: hyst  Other Topics Concern  .  Not on file  Social History Narrative  . Not on file   Social Determinants of Health   Financial Resource Strain:   . Difficulty of Paying Living Expenses: Not on file  Food Insecurity:   . Worried About Charity fundraiser in the Last Year: Not on file  . Ran Out of Food in the Last Year: Not on file  Transportation Needs:   . Lack of Transportation (Medical): Not on file  . Lack of Transportation (Non-Medical): Not on file  Physical Activity:   . Days of Exercise per Week: Not on file  . Minutes of Exercise per Session: Not on file  Stress:   . Feeling of Stress : Not on  file  Social Connections:   . Frequency of Communication with Friends and Family: Not on file  . Frequency of Social Gatherings with Friends and Family: Not on file  . Attends Religious Services: Not on file  . Active Member of Clubs or Organizations: Not on file  . Attends Archivist Meetings: Not on file  . Marital Status: Not on file    Allergies:  Allergies  Allergen Reactions  . Doxycycline Nausea And Vomiting  . Sulfonamide Derivatives Nausea And Vomiting    Metabolic Disorder Labs: Lab Results  Component Value Date   HGBA1C 5.2 04/27/2017   MPG 103 04/27/2017   MPG 100 07/21/2016   No results found for: PROLACTIN No results found for: CHOL, TRIG, HDL, CHOLHDL, VLDL, LDLCALC Lab Results  Component Value Date   TSH 0.06 (L) 04/12/2019   TSH 0.03 (L) 10/11/2018    Therapeutic Level Labs: No results found for: LITHIUM No results found for: VALPROATE No components found for:  CBMZ  Current Medications: Current Outpatient Medications  Medication Sig Dispense Refill  . ALPRAZolam (XANAX) 1 MG tablet Take 1 tablet (1 mg total) by mouth 3 (three) times daily as needed for anxiety. 90 tablet 5  . conjugated estrogens (PREMARIN) vaginal cream USE 1 GRAM NIGHTLY AS DIRECTED 90 g 4  . DIVIGEL 1 MG/GM GEL PLACE 1 PACKET ONTO THE SKIN DAILY. 1 g 3  . DULoxetine (CYMBALTA) 60 MG capsule Take 1 capsule (60 mg total) by mouth 2 (two) times daily. 180 capsule 1  . HYDROcodone-acetaminophen (NORCO/VICODIN) 5-325 MG tablet Take 1 tablet by mouth every 6 (six) hours as needed for moderate pain (Must last 30 days.). 55 tablet 0  . ibuprofen (ADVIL,MOTRIN) 200 MG tablet Take 400-600 mg by mouth every 8 (eight) hours as needed (for pain/headaches.).    Marland Kitchen levothyroxine (SYNTHROID) 125 MCG tablet Take 1 tablet (125 mcg total) by mouth daily before breakfast. 30 tablet 3  . tiZANidine (ZANAFLEX) 4 MG tablet Take 4-8 mg by mouth See admin instructions. 4 mg daily as needed for  muscle spasms, tension, or mild anxiety & 4-8 mg at bedtime as needed for sleep/anxiety/spasms.    . traZODone (DESYREL) 100 MG tablet Take 2 tablets (200 mg total) by mouth at bedtime as needed for sleep. 180 tablet 0  . Vitamin D, Ergocalciferol, (DRISDOL) 1.25 MG (50000 UT) CAPS capsule TAKE 1 CAPSULE (50,000 UNITS TOTAL) BY MOUTH EVERY 7 (SEVEN) DAYS. 12 capsule 0   No current facility-administered medications for this visit.     Psychiatric Specialty Exam: Review of Systems  Musculoskeletal: Positive for arthralgias and back pain.  Psychiatric/Behavioral: The patient is nervous/anxious.   All other systems reviewed and are negative.   Last menstrual period 07/24/2016.There is no height or  weight on file to calculate BMI.  General Appearance: NA  Eye Contact:  NA  Speech:  Clear and Coherent and Normal Rate  Volume:  Normal  Mood:  Anxious and Depressed  Affect:  NA  Thought Process:  Goal Directed and Linear  Orientation:  Full (Time, Place, and Person)  Thought Content: Rumination   Suicidal Thoughts:  No  Homicidal Thoughts:  No  Memory:  Immediate;   Good Recent;   Good Remote;   Good  Judgement:  Good  Insight:  Fair  Psychomotor Activity:  NA  Concentration:  Concentration: Fair  Recall:  Good  Fund of Knowledge: Good  Language: Good  Akathisia:  Negative  Handed:  Right  AIMS (if indicated): not done  Assets:  Communication Skills Desire for Improvement Financial Resources/Insurance Housing Resilience Social Support  ADL's:  Intact  Cognition: WNL  Sleep:  Good   Screenings: PHQ2-9     Office Visit from 03/18/2016 in [facility]  PHQ-2 Total Score  0       Assessment and Plan: 44yo married female with panic disorder, GAD and MDD.She has beendepressed,anxious and having more problems with sleep due to stress related to . Shereportedthat alprazolamworks better than clonazepamdidso we changed it back.We increased  duloxetine and trazodone doses. Harlynn now reports feeling less depressed but is still anxious - few "close calls" among family memeners with COVID. She does not work at this time. Sleep adequate.  Alprazolam used as needed but she takes it daily several times splitting tablets in half. No SI, normal appetite, no psychosis.Pain better controlled as she is back on hydrocodone/APAP.  Dx: Panic disorder; GAD; MDD recurrent mild  Plan:Increase duloxetine to 60 mg bid and continue alprazolam 1 mg tid prn anxiety and trazodone 200 mg at HS for sleep.The plan was discussed with patient who had an opportunity to ask questions and these were all answered. I spend25 minutes inphone contact with patient. Next appointment in41month.    OStephanie Acre MD 06/28/2019, 10:47 AM

## 2019-06-29 ENCOUNTER — Other Ambulatory Visit: Payer: Self-pay

## 2019-06-29 ENCOUNTER — Encounter: Payer: Self-pay | Admitting: Orthopaedic Surgery

## 2019-06-29 ENCOUNTER — Ambulatory Visit (INDEPENDENT_AMBULATORY_CARE_PROVIDER_SITE_OTHER): Payer: 59 | Admitting: Orthopaedic Surgery

## 2019-06-29 VITALS — BP 111/65 | HR 64 | Ht 63.0 in | Wt 168.0 lb

## 2019-06-29 DIAGNOSIS — M545 Low back pain: Secondary | ICD-10-CM | POA: Diagnosis not present

## 2019-06-29 DIAGNOSIS — G8929 Other chronic pain: Secondary | ICD-10-CM

## 2019-06-29 DIAGNOSIS — F17210 Nicotine dependence, cigarettes, uncomplicated: Secondary | ICD-10-CM

## 2019-06-29 DIAGNOSIS — G894 Chronic pain syndrome: Secondary | ICD-10-CM

## 2019-06-29 DIAGNOSIS — S96911D Strain of unspecified muscle and tendon at ankle and foot level, right foot, subsequent encounter: Secondary | ICD-10-CM | POA: Diagnosis not present

## 2019-06-29 MED ORDER — PREDNISONE 5 MG (21) PO TBPK: ORAL_TABLET | ORAL | 0 refills | Status: DC

## 2019-06-29 NOTE — Progress Notes (Signed)
Patient Vanessa Bowen, female DOB:May 31, 1976, 44 y.o. WGY:[id number]  Chief Complaint  Patient presents with  . Ankle Pain    right   . Back Pain    HPI  Vanessa Bowen is a 44 y.o. female who has ankle pain on the right but that is better today. She has less pain and swelling.  Her lower back is hurting more today. She has paresthesias to the right lower leg that has returned. She has more pain after activity.  The cooler weather has made it worse too. She is taking her medicine with little help. She had MRI in January of last year.   Body mass index is 29.76 kg/m.  ROS  Review of Systems  HENT: Negative for congestion.   Respiratory: Negative for cough and shortness of breath.   Cardiovascular: Negative for chest pain and leg swelling.  Endocrine: Positive for cold intolerance.  Musculoskeletal: Positive for arthralgias and back pain.  Allergic/Immunologic: Positive for environmental allergies.  Neurological: Positive for headaches.  Psychiatric/Behavioral: The patient is nervous/anxious.   All other systems reviewed and are negative.   All other systems reviewed and are negative.  The following is a summary of the past history medically, past history surgically, known current medicines, social history and family history.  This information is gathered electronically by the computer from prior information and documentation.  I review this each visit and have found including this information at this point in the chart is beneficial and informative.    Past Medical History:  Diagnosis Date  . Anxiety   . Arthritis   . Chronic back pain   . Colitis, ulcerative ([place])   . Depression   . Elevated blood pressure, situational   . Hypothyroidism   . Migraine     Past Surgical History:  Procedure Laterality Date  . ANKLE SURGERY    . BREAST ENHANCEMENT SURGERY    . CESAREAN SECTION    . KNEE SURGERY    . LAPAROSCOPIC BILATERAL SALPINGECTOMY  07/27/2012   Procedure:  LAPAROSCOPIC BILATERAL SALPINGECTOMY;  Surgeon: Florian Buff, MD;  Location: [facility];  Service: Gynecology;  Laterality: N/A;  . OOPHORECTOMY Right 09/09/2016   Procedure: RIGHT OOPHORECTOMY;  Surgeon: Florian Buff, MD;  Location: [facility];  Service: Gynecology;  Laterality: Right;  . OVARIAN CYST REMOVAL    . VAGINAL HYSTERECTOMY N/A 09/09/2016   Procedure: HYSTERECTOMY VAGINAL;  Surgeon: Florian Buff, MD;  Location: [facility];  Service: Gynecology;  Laterality: N/A;    Family History  Problem Relation Age of Onset  . Cancer Maternal Grandmother   . Cancer Maternal Grandfather   . Alcohol abuse Maternal Grandfather   . Hypothyroidism Daughter   . Bipolar disorder Daughter   . Anxiety disorder Daughter   . Depression Mother   . Alcohol abuse Mother   . Drug abuse Sister   . Anxiety disorder Sister     Social History Social History   Tobacco Use  . Smoking status: Current Every Day Smoker    Packs/day: 0.50    Years: 15.00    Pack years: 7.50    Types: Cigarettes  . Smokeless tobacco: Never Used  Substance Use Topics  . Alcohol use: Yes    Comment: Occasionally  . Drug use: No    Allergies  Allergen Reactions  . Doxycycline Nausea And Vomiting  . Sulfonamide Derivatives Nausea And Vomiting    Current Outpatient Medications  Medication Sig Dispense Refill  . ALPRAZolam (XANAX) 1 MG tablet  Take 1 tablet (1 mg total) by mouth 3 (three) times daily as needed for anxiety. 90 tablet 5  . conjugated estrogens (PREMARIN) vaginal cream USE 1 GRAM NIGHTLY AS DIRECTED 90 g 4  . DIVIGEL 1 MG/GM GEL PLACE 1 PACKET ONTO THE SKIN DAILY. 1 g 3  . DULoxetine (CYMBALTA) 60 MG capsule Take 1 capsule (60 mg total) by mouth 2 (two) times daily. 180 capsule 1  . HYDROcodone-acetaminophen (NORCO/VICODIN) 5-325 MG tablet Take 1 tablet by mouth every 6 (six) hours as needed for moderate pain (Must last 30 days.). 55 tablet 0  . ibuprofen (ADVIL,MOTRIN) 200 MG tablet Take 400-600 mg by mouth every  8 (eight) hours as needed (for pain/headaches.).    Marland Kitchen levothyroxine (SYNTHROID) 125 MCG tablet Take 1 tablet (125 mcg total) by mouth daily before breakfast. 30 tablet 3  . tiZANidine (ZANAFLEX) 4 MG tablet Take 4-8 mg by mouth See admin instructions. 4 mg daily as needed for muscle spasms, tension, or mild anxiety & 4-8 mg at bedtime as needed for sleep/anxiety/spasms.    . traZODone (DESYREL) 100 MG tablet Take 2 tablets (200 mg total) by mouth at bedtime as needed for sleep. 180 tablet 0  . Vitamin D, Ergocalciferol, (DRISDOL) 1.25 MG (50000 UT) CAPS capsule TAKE 1 CAPSULE (50,000 UNITS TOTAL) BY MOUTH EVERY 7 (SEVEN) DAYS. 12 capsule 0  . predniSONE (STERAPRED UNI-PAK 21 TAB) 5 MG (21) TBPK tablet Take 6 pills first day; 5 pills second day; 4 pills third day; 3 pills fourth day; 2 pills next day and 1 pill last day. 21 tablet 0   No current facility-administered medications for this visit.     Physical Exam  Blood pressure 111/65, pulse 64, height 5' 3"  (1.6 m), weight 168 lb (76.2 kg), last menstrual period 07/24/2016.  Constitutional: overall normal hygiene, normal nutrition, well developed, normal grooming, normal body habitus. Assistive device:none  Musculoskeletal: gait and station Limp none, muscle tone and strength are normal, no tremors or atrophy is present.  .  Neurological: coordination overall normal.  Deep tendon reflex/nerve stretch intact.  Sensation normal.  Cranial nerves II-XII intact.   Skin:   Normal overall no scars, lesions, ulcers or rashes. No psoriasis.  Psychiatric: Alert and oriented x 3.  Recent memory intact, remote memory unclear.  Normal mood and affect. Well groomed.  Good eye contact.  Cardiovascular: overall no swelling, no varicosities, no edema bilaterally, normal temperatures of the legs and arms, no clubbing, cyanosis and good capillary refill.  Right ankle has good ROM today, NV intact.  Lymphatic: palpation is normal.  Spine/Pelvis  examination:  Inspection:  Overall, sacoiliac joint benign and hips nontender; without crepitus or defects.   Thoracic spine inspection: Alignment normal without kyphosis present   Lumbar spine inspection:  Alignment  with normal lumbar lordosis, without scoliosis apparent.   Thoracic spine palpation:  without tenderness of spinal processes   Lumbar spine palpation: without tenderness of lumbar area; without tightness of lumbar muscles    Range of Motion:   Lumbar flexion, forward flexion is normal without pain or tenderness    Lumbar extension is full without pain or tenderness   Left lateral bend is normal without pain or tenderness   Right lateral bend is normal without pain or tenderness   Straight leg raising is normal  Strength & tone: normal   Stability overall normal stability  All other systems reviewed and are negative   The patient has been educated about the nature  of the problem(s) and counseled on treatment options.  The patient appeared to understand what I have discussed and is in agreement with it.  Encounter Diagnoses  Name Primary?  . Chronic midline low back pain without sciatica Yes  . Chronic pain syndrome   . Cigarette nicotine dependence without complication     PLAN Call if any problems.  Precautions discussed.  Continue current medications. Begin prednisone dose pack.  Return to clinic 1 week   Electronically Signed Sanjuana Kava, MD 1/7/20219:57 AM

## 2019-07-06 ENCOUNTER — Encounter: Payer: Self-pay | Admitting: Orthopaedic Surgery

## 2019-07-06 ENCOUNTER — Ambulatory Visit (INDEPENDENT_AMBULATORY_CARE_PROVIDER_SITE_OTHER): Payer: 59 | Admitting: Orthopaedic Surgery

## 2019-07-06 ENCOUNTER — Other Ambulatory Visit: Payer: Self-pay

## 2019-07-06 VITALS — BP 123/68 | HR 65 | Temp 97.1°F | Ht 63.0 in | Wt 168.0 lb

## 2019-07-06 DIAGNOSIS — G894 Chronic pain syndrome: Secondary | ICD-10-CM | POA: Diagnosis not present

## 2019-07-06 DIAGNOSIS — F17210 Nicotine dependence, cigarettes, uncomplicated: Secondary | ICD-10-CM

## 2019-07-06 DIAGNOSIS — M545 Low back pain: Secondary | ICD-10-CM

## 2019-07-06 DIAGNOSIS — G8929 Other chronic pain: Secondary | ICD-10-CM

## 2019-07-06 MED ORDER — HYDROCODONE-ACETAMINOPHEN 5-325 MG PO TABS: 1.0000 | ORAL_TABLET | Freq: Four times a day (QID) | ORAL | 0 refills | Status: DC | PRN

## 2019-07-06 NOTE — Progress Notes (Signed)
Patient Vanessa Bowen, female DOB:1975-08-07, 44 y.o. BRA:[id number]  Chief Complaint  Patient presents with  . Back Pain    Chronic low back pain    HPI  Vanessa Bowen is a 44 y.o. female who has chronic lower back pain.  She is better with the prednisone dose pack.  She has less pain and less paresthesia on the right.  She has no new trauma, no weakness.   Body mass index is 29.76 kg/m.  ROS  Review of Systems  HENT: Negative for congestion.   Respiratory: Negative for cough and shortness of breath.   Cardiovascular: Negative for chest pain and leg swelling.  Endocrine: Positive for cold intolerance.  Musculoskeletal: Positive for arthralgias and back pain.  Allergic/Immunologic: Positive for environmental allergies.  Neurological: Positive for headaches.  Psychiatric/Behavioral: The patient is nervous/anxious.   All other systems reviewed and are negative.   All other systems reviewed and are negative.  The following is a summary of the past history medically, past history surgically, known current medicines, social history and family history.  This information is gathered electronically by the computer from prior information and documentation.  I review this each visit and have found including this information at this point in the chart is beneficial and informative.    Past Medical History:  Diagnosis Date  . Anxiety   . Arthritis   . Chronic back pain   . Colitis, ulcerative ([place])   . Depression   . Elevated blood pressure, situational   . Hypothyroidism   . Migraine     Past Surgical History:  Procedure Laterality Date  . ANKLE SURGERY    . BREAST ENHANCEMENT SURGERY    . CESAREAN SECTION    . KNEE SURGERY    . LAPAROSCOPIC BILATERAL SALPINGECTOMY  07/27/2012   Procedure: LAPAROSCOPIC BILATERAL SALPINGECTOMY;  Surgeon: Florian Buff, MD;  Location: [facility];  Service: Gynecology;  Laterality: N/A;  . OOPHORECTOMY Right 09/09/2016   Procedure: RIGHT OOPHORECTOMY;   Surgeon: Florian Buff, MD;  Location: [facility];  Service: Gynecology;  Laterality: Right;  . OVARIAN CYST REMOVAL    . VAGINAL HYSTERECTOMY N/A 09/09/2016   Procedure: HYSTERECTOMY VAGINAL;  Surgeon: Florian Buff, MD;  Location: [facility];  Service: Gynecology;  Laterality: N/A;    Family History  Problem Relation Age of Onset  . Cancer Maternal Grandmother   . Cancer Maternal Grandfather   . Alcohol abuse Maternal Grandfather   . Hypothyroidism Daughter   . Bipolar disorder Daughter   . Anxiety disorder Daughter   . Depression Mother   . Alcohol abuse Mother   . Drug abuse Sister   . Anxiety disorder Sister     Social History Social History   Tobacco Use  . Smoking status: Current Every Day Smoker    Packs/day: 0.50    Years: 15.00    Pack years: 7.50    Types: Cigarettes  . Smokeless tobacco: Never Used  Substance Use Topics  . Alcohol use: Yes    Comment: Occasionally  . Drug use: No    Allergies  Allergen Reactions  . Doxycycline Nausea And Vomiting  . Sulfonamide Derivatives Nausea And Vomiting    Current Outpatient Medications  Medication Sig Dispense Refill  . ALPRAZolam (XANAX) 1 MG tablet Take 1 tablet (1 mg total) by mouth 3 (three) times daily as needed for anxiety. 90 tablet 5  . conjugated estrogens (PREMARIN) vaginal cream USE 1 GRAM NIGHTLY AS DIRECTED 90 g 4  .  DIVIGEL 1 MG/GM GEL PLACE 1 PACKET ONTO THE SKIN DAILY. 1 g 3  . DULoxetine (CYMBALTA) 60 MG capsule Take 1 capsule (60 mg total) by mouth 2 (two) times daily. 180 capsule 1  . HYDROcodone-acetaminophen (NORCO/VICODIN) 5-325 MG tablet Take 1 tablet by mouth every 6 (six) hours as needed for moderate pain (Must last 30 days.). 55 tablet 0  . ibuprofen (ADVIL,MOTRIN) 200 MG tablet Take 400-600 mg by mouth every 8 (eight) hours as needed (for pain/headaches.).    Marland Kitchen levothyroxine (SYNTHROID) 125 MCG tablet Take 1 tablet (125 mcg total) by mouth daily before breakfast. 30 tablet 3  . tiZANidine  (ZANAFLEX) 4 MG tablet Take 4-8 mg by mouth See admin instructions. 4 mg daily as needed for muscle spasms, tension, or mild anxiety & 4-8 mg at bedtime as needed for sleep/anxiety/spasms.    . traZODone (DESYREL) 100 MG tablet Take 2 tablets (200 mg total) by mouth at bedtime as needed for sleep. 180 tablet 0  . Vitamin D, Ergocalciferol, (DRISDOL) 1.25 MG (50000 UT) CAPS capsule TAKE 1 CAPSULE (50,000 UNITS TOTAL) BY MOUTH EVERY 7 (SEVEN) DAYS. 12 capsule 0  . predniSONE (STERAPRED UNI-PAK 21 TAB) 5 MG (21) TBPK tablet Take 6 pills first day; 5 pills second day; 4 pills third day; 3 pills fourth day; 2 pills next day and 1 pill last day. (Patient not taking: Reported on 07/06/2019) 21 tablet 0   No current facility-administered medications for this visit.     Physical Exam  Blood pressure 123/68, pulse 65, temperature (!) 97.1 F (36.2 C), height 5' 3"  (1.6 m), weight 168 lb (76.2 kg), last menstrual period 07/24/2016.  Constitutional: overall normal hygiene, normal nutrition, well developed, normal grooming, normal body habitus. Assistive device:none  Musculoskeletal: gait and station Limp none, muscle tone and strength are normal, no tremors or atrophy is present.  .  Neurological: coordination overall normal.  Deep tendon reflex/nerve stretch intact.  Sensation normal.  Cranial nerves II-XII intact.   Skin:   Normal overall no scars, lesions, ulcers or rashes. No psoriasis.  Psychiatric: Alert and oriented x 3.  Recent memory intact, remote memory unclear.  Normal mood and affect. Well groomed.  Good eye contact.  Cardiovascular: overall no swelling, no varicosities, no edema bilaterally, normal temperatures of the legs and arms, no clubbing, cyanosis and good capillary refill.  Lymphatic: palpation is normal.  Spine/Pelvis examination:  Inspection:  Overall, sacoiliac joint benign and hips nontender; without crepitus or defects.   Thoracic spine inspection: Alignment normal  without kyphosis present   Lumbar spine inspection:  Alignment  with normal lumbar lordosis, without scoliosis apparent.   Thoracic spine palpation:  without tenderness of spinal processes   Lumbar spine palpation: without tenderness of lumbar area; without tightness of lumbar muscles    Range of Motion:   Lumbar flexion, forward flexion is normal without pain or tenderness    Lumbar extension is full without pain or tenderness   Left lateral bend is normal without pain or tenderness   Right lateral bend is normal without pain or tenderness   Straight leg raising is normal  Strength & tone: normal   Stability overall normal stability  All other systems reviewed and are negative   The patient has been educated about the nature of the problem(s) and counseled on treatment options.  The patient appeared to understand what I have discussed and is in agreement with it.  Encounter Diagnoses  Name Primary?  . Chronic midline  low back pain without sciatica Yes  . Chronic pain syndrome   . Cigarette nicotine dependence without complication     PLAN Call if any problems.  Precautions discussed.  Continue current medications.   Return to clinic 3 weeks   I have reviewed the [place] web site prior to prescribing narcotic medicine for this patient.   Electronically Signed Sanjuana Kava, MD 1/14/202110:30 AM

## 2019-07-06 NOTE — Patient Instructions (Signed)
Steps to Quit Smoking Smoking tobacco is the leading cause of preventable death. It can affect almost every organ in the body. Smoking puts you and people around you at risk for many serious, long-lasting (chronic) diseases. Quitting smoking can be hard, but it is one of the best things that you can do for your health. It is never too late to quit. How do I get ready to quit? When you decide to quit smoking, make a plan to help you succeed. Before you quit:  Pick a date to quit. Set a date within the next 2 weeks to give you time to prepare.  Write down the reasons why you are quitting. Keep this list in places where you will see it often.  Tell your family, friends, and co-workers that you are quitting. Their support is important.  Talk with your doctor about the choices that may help you quit.  Find out if your health insurance will pay for these treatments.  Know the people, places, things, and activities that make you want to smoke (triggers). Avoid them. What first steps can I take to quit smoking?  Throw away all cigarettes at home, at work, and in your car.  Throw away the things that you use when you smoke, such as ashtrays and lighters.  Clean your car. Make sure to empty the ashtray.  Clean your home, including curtains and carpets. What can I do to help me quit smoking? Talk with your doctor about taking medicines and seeing a counselor at the same time. You are more likely to succeed when you do both.  If you are pregnant or breastfeeding, talk with your doctor about counseling or other ways to quit smoking. Do not take medicine to help you quit smoking unless your doctor tells you to do so. To quit smoking: Quit right away  Quit smoking totally, instead of slowly cutting back on how much you smoke over a period of time.  Go to counseling. You are more likely to quit if you go to counseling sessions regularly. Take medicine You may take medicines to help you quit. Some  medicines need a prescription, and some you can buy over-the-counter. Some medicines may contain a drug called nicotine to replace the nicotine in cigarettes. Medicines may:  Help you to stop having the desire to smoke (cravings).  Help to stop the problems that come when you stop smoking (withdrawal symptoms). Your doctor may ask you to use:  Nicotine patches, gum, or lozenges.  Nicotine inhalers or sprays.  Non-nicotine medicine that is taken by mouth. Find resources Find resources and other ways to help you quit smoking and remain smoke-free after you quit. These resources are most helpful when you use them often. They include:  Online chats with a counselor.  Phone quitlines.  Printed self-help materials.  Support groups or group counseling.  Text messaging programs.  Mobile phone apps. Use apps on your mobile phone or tablet that can help you stick to your quit plan. There are many free apps for mobile phones and tablets as well as websites. Examples include Quit Guide from the CDC and smokefree.gov  What things can I do to make it easier to quit?   Talk to your family and friends. Ask them to support and encourage you.  Call a phone quitline (1-800-QUIT-NOW), reach out to support groups, or work with a counselor.  Ask people who smoke to not smoke around you.  Avoid places that make you want to smoke,   such as: ? Bars. ? Parties. ? Smoke-break areas at work.  Spend time with people who do not smoke.  Lower the stress in your life. Stress can make you want to smoke. Try these things to help your stress: ? Getting regular exercise. ? Doing deep-breathing exercises. ? Doing yoga. ? Meditating. ? Doing a body scan. To do this, close your eyes, focus on one area of your body at a time from head to toe. Notice which parts of your body are tense. Try to relax the muscles in those areas. How will I feel when I quit smoking? Day 1 to 3 weeks Within the first 24 hours,  you may start to have some problems that come from quitting tobacco. These problems are very bad 2-3 days after you quit, but they do not often last for more than 2-3 weeks. You may get these symptoms:  Mood swings.  Feeling restless, nervous, angry, or annoyed.  Trouble concentrating.  Dizziness.  Strong desire for high-sugar foods and nicotine.  Weight gain.  Trouble pooping (constipation).  Feeling like you may vomit (nausea).  Coughing or a sore throat.  Changes in how the medicines that you take for other issues work in your body.  Depression.  Trouble sleeping (insomnia). Week 3 and afterward After the first 2-3 weeks of quitting, you may start to notice more positive results, such as:  Better sense of smell and taste.  Less coughing and sore throat.  Slower heart rate.  Lower blood pressure.  Clearer skin.  Better breathing.  Fewer sick days. Quitting smoking can be hard. Do not give up if you fail the first time. Some people need to try a few times before they succeed. Do your best to stick to your quit plan, and talk with your doctor if you have any questions or concerns. Summary  Smoking tobacco is the leading cause of preventable death. Quitting smoking can be hard, but it is one of the best things that you can do for your health.  When you decide to quit smoking, make a plan to help you succeed.  Quit smoking right away, not slowly over a period of time.  When you start quitting, seek help from your doctor, family, or friends. This information is not intended to replace advice given to you by your health care provider. Make sure you discuss any questions you have with your health care provider. Document Revised: 03/03/2019 Document Reviewed: 08/27/2018 Elsevier Patient Education  2020 [employer] Inc.  

## 2019-07-27 ENCOUNTER — Ambulatory Visit: Payer: 59 | Admitting: Orthopaedic Surgery

## 2019-08-01 ENCOUNTER — Ambulatory Visit (INDEPENDENT_AMBULATORY_CARE_PROVIDER_SITE_OTHER): Payer: 59 | Admitting: Orthopaedic Surgery

## 2019-08-01 ENCOUNTER — Other Ambulatory Visit: Payer: Self-pay

## 2019-08-01 ENCOUNTER — Encounter: Payer: Self-pay | Admitting: Orthopaedic Surgery

## 2019-08-01 VITALS — BP 131/100 | HR 73 | Temp 97.7°F | Ht 63.0 in | Wt 171.1 lb

## 2019-08-01 DIAGNOSIS — F17210 Nicotine dependence, cigarettes, uncomplicated: Secondary | ICD-10-CM | POA: Diagnosis not present

## 2019-08-01 DIAGNOSIS — M545 Low back pain, unspecified: Secondary | ICD-10-CM

## 2019-08-01 DIAGNOSIS — G8929 Other chronic pain: Secondary | ICD-10-CM | POA: Diagnosis not present

## 2019-08-01 DIAGNOSIS — G894 Chronic pain syndrome: Secondary | ICD-10-CM

## 2019-08-01 MED ORDER — HYDROCODONE-ACETAMINOPHEN 5-325 MG PO TABS: 1.0000 | ORAL_TABLET | Freq: Four times a day (QID) | ORAL | 0 refills | Status: DC | PRN

## 2019-08-01 NOTE — Progress Notes (Signed)
Patient Vanessa Bowen, female DOB:10-Oct-1975, 44 y.o. AVW:[id number]  Chief Complaint  Patient presents with  . Back Pain    Its about the same    HPI  Vanessa Bowen is a 44 y.o. female who has chronic lower back pain. She has good days and bad days.   Cold weather makes her worse. She is doing her exercises and taking her medicine. She has no weakness,  No new trauma.   Body mass index is 30.31 kg/m.  ROS  Review of Systems  HENT: Negative for congestion.   Respiratory: Negative for cough and shortness of breath.   Cardiovascular: Negative for chest pain and leg swelling.  Endocrine: Positive for cold intolerance.  Musculoskeletal: Positive for arthralgias and back pain.  Allergic/Immunologic: Positive for environmental allergies.  Neurological: Positive for headaches.  Psychiatric/Behavioral: The patient is nervous/anxious.   All other systems reviewed and are negative.   All other systems reviewed and are negative.  The following is a summary of the past history medically, past history surgically, known current medicines, social history and family history.  This information is gathered electronically by the computer from prior information and documentation.  I review this each visit and have found including this information at this point in the chart is beneficial and informative.    Past Medical History:  Diagnosis Date  . Anxiety   . Arthritis   . Chronic back pain   . Colitis, ulcerative ([place])   . Depression   . Elevated blood pressure, situational   . Hypothyroidism   . Migraine     Past Surgical History:  Procedure Laterality Date  . ANKLE SURGERY    . BREAST ENHANCEMENT SURGERY    . CESAREAN SECTION    . KNEE SURGERY    . LAPAROSCOPIC BILATERAL SALPINGECTOMY  07/27/2012   Procedure: LAPAROSCOPIC BILATERAL SALPINGECTOMY;  Surgeon: Florian Buff, MD;  Location: [facility];  Service: Gynecology;  Laterality: N/A;  . OOPHORECTOMY Right 09/09/2016   Procedure: RIGHT  OOPHORECTOMY;  Surgeon: Florian Buff, MD;  Location: [facility];  Service: Gynecology;  Laterality: Right;  . OVARIAN CYST REMOVAL    . VAGINAL HYSTERECTOMY N/A 09/09/2016   Procedure: HYSTERECTOMY VAGINAL;  Surgeon: Florian Buff, MD;  Location: [facility];  Service: Gynecology;  Laterality: N/A;    Family History  Problem Relation Age of Onset  . Cancer Maternal Grandmother   . Cancer Maternal Grandfather   . Alcohol abuse Maternal Grandfather   . Hypothyroidism Daughter   . Bipolar disorder Daughter   . Anxiety disorder Daughter   . Depression Mother   . Alcohol abuse Mother   . Drug abuse Sister   . Anxiety disorder Sister     Social History Social History   Tobacco Use  . Smoking status: Current Every Day Smoker    Packs/day: 0.50    Years: 15.00    Pack years: 7.50    Types: Cigarettes  . Smokeless tobacco: Never Used  Substance Use Topics  . Alcohol use: Yes    Comment: Occasionally  . Drug use: No    Allergies  Allergen Reactions  . Doxycycline Nausea And Vomiting  . Sulfonamide Derivatives Nausea And Vomiting    Current Outpatient Medications  Medication Sig Dispense Refill  . ALPRAZolam (XANAX) 1 MG tablet Take 1 tablet (1 mg total) by mouth 3 (three) times daily as needed for anxiety. 90 tablet 5  . conjugated estrogens (PREMARIN) vaginal cream USE 1 GRAM NIGHTLY AS DIRECTED 90  g 4  . DIVIGEL 1 MG/GM GEL PLACE 1 PACKET ONTO THE SKIN DAILY. 1 g 3  . DULoxetine (CYMBALTA) 60 MG capsule Take 1 capsule (60 mg total) by mouth 2 (two) times daily. 180 capsule 1  . HYDROcodone-acetaminophen (NORCO/VICODIN) 5-325 MG tablet Take 1 tablet by mouth every 6 (six) hours as needed for moderate pain (Must last 30 days.). 60 tablet 0  . ibuprofen (ADVIL,MOTRIN) 200 MG tablet Take 400-600 mg by mouth every 8 (eight) hours as needed (for pain/headaches.).    Marland Kitchen levothyroxine (SYNTHROID) 125 MCG tablet Take 1 tablet (125 mcg total) by mouth daily before breakfast. 30 tablet 3  .  predniSONE (STERAPRED UNI-PAK 21 TAB) 5 MG (21) TBPK tablet Take 6 pills first day; 5 pills second day; 4 pills third day; 3 pills fourth day; 2 pills next day and 1 pill last day. (Patient not taking: Reported on 07/06/2019) 21 tablet 0  . tiZANidine (ZANAFLEX) 4 MG tablet Take 4-8 mg by mouth See admin instructions. 4 mg daily as needed for muscle spasms, tension, or mild anxiety & 4-8 mg at bedtime as needed for sleep/anxiety/spasms.    . traZODone (DESYREL) 100 MG tablet Take 2 tablets (200 mg total) by mouth at bedtime as needed for sleep. 180 tablet 0  . Vitamin D, Ergocalciferol, (DRISDOL) 1.25 MG (50000 UT) CAPS capsule TAKE 1 CAPSULE (50,000 UNITS TOTAL) BY MOUTH EVERY 7 (SEVEN) DAYS. 12 capsule 0   No current facility-administered medications for this visit.     Physical Exam  Blood pressure (!) 131/100, pulse 73, temperature 97.7 F (36.5 C), height 5' 3"  (1.6 m), weight 171 lb 2 oz (77.6 kg), last menstrual period 07/24/2016.  Constitutional: overall normal hygiene, normal nutrition, well developed, normal grooming, normal body habitus. Assistive device:none  Musculoskeletal: gait and station Limp none, muscle tone and strength are normal, no tremors or atrophy is present.  .  Neurological: coordination overall normal.  Deep tendon reflex/nerve stretch intact.  Sensation normal.  Cranial nerves II-XII intact.   Skin:   Normal overall no scars, lesions, ulcers or rashes. No psoriasis.  Psychiatric: Alert and oriented x 3.  Recent memory intact, remote memory unclear.  Normal mood and affect. Well groomed.  Good eye contact.  Cardiovascular: overall no swelling, no varicosities, no edema bilaterally, normal temperatures of the legs and arms, no clubbing, cyanosis and good capillary refill.  Lymphatic: palpation is normal.  Spine/Pelvis examination:  Inspection:  Overall, sacoiliac joint benign and hips nontender; without crepitus or defects.   Thoracic spine inspection:  Alignment normal without kyphosis present   Lumbar spine inspection:  Alignment  with normal lumbar lordosis, without scoliosis apparent.   Thoracic spine palpation:  without tenderness of spinal processes   Lumbar spine palpation: without tenderness of lumbar area; without tightness of lumbar muscles    Range of Motion:   Lumbar flexion, forward flexion is abnormal without pain or tenderness Bends only 30 degrees forward.   Lumbar extension is full without pain or tenderness   Left lateral bend is normal without pain or tenderness   Right lateral bend is normal without pain or tenderness   Straight leg raising is normal  Strength & tone: normal   Stability overall normal stability  All other systems reviewed and are negative   The patient has been educated about the nature of the problem(s) and counseled on treatment options.  The patient appeared to understand what I have discussed and is in agreement with it.  Encounter Diagnoses  Name Primary?  . Chronic midline low back pain without sciatica Yes  . Chronic pain syndrome   . Cigarette nicotine dependence without complication     PLAN Call if any problems.  Precautions discussed.  Continue current medications.   Return to clinic 1 month   I have reviewed the [employer] web site prior to prescribing narcotic medicine for this patient.   Electronically Signed Sanjuana Kava, MD 2/9/20212:53 PM

## 2019-08-01 NOTE — Patient Instructions (Signed)

## 2019-08-14 ENCOUNTER — Encounter (HOSPITAL_COMMUNITY): Payer: Self-pay | Admitting: Emergency Medicine

## 2019-08-14 ENCOUNTER — Other Ambulatory Visit: Payer: Self-pay

## 2019-08-14 ENCOUNTER — Emergency Department (HOSPITAL_COMMUNITY): Payer: 59

## 2019-08-14 ENCOUNTER — Emergency Department (HOSPITAL_COMMUNITY)
Admission: EM | Admit: 2019-08-14 | Discharge: 2019-08-14 | Disposition: A | Discharge status: Home / Self Care | Payer: 59 | Attending: Emergency Medicine | Admitting: Emergency Medicine

## 2019-08-14 DIAGNOSIS — R0602 Shortness of breath: Secondary | ICD-10-CM | POA: Insufficient documentation

## 2019-08-14 DIAGNOSIS — F17210 Nicotine dependence, cigarettes, uncomplicated: Secondary | ICD-10-CM | POA: Diagnosis not present

## 2019-08-14 DIAGNOSIS — R079 Chest pain, unspecified: Secondary | ICD-10-CM

## 2019-08-14 DIAGNOSIS — R110 Nausea: Secondary | ICD-10-CM | POA: Diagnosis not present

## 2019-08-14 DIAGNOSIS — R0789 Other chest pain: Secondary | ICD-10-CM | POA: Diagnosis not present

## 2019-08-14 DIAGNOSIS — R42 Dizziness and giddiness: Secondary | ICD-10-CM

## 2019-08-14 LAB — CBC WITH DIFFERENTIAL/PLATELET
Abs Immature Granulocytes: 0.04 10*3/uL (ref 0.00–0.07)
Basophils Absolute: 0.1 10*3/uL (ref 0.0–0.1)
Basophils Relative: 1 %
Eosinophils Absolute: 0.2 10*3/uL (ref 0.0–0.5)
Eosinophils Relative: 1 %
HCT: 42.8 % (ref 36.0–46.0)
Hemoglobin: 14.5 g/dL (ref 12.0–15.0)
Immature Granulocytes: 0 %
Lymphocytes Relative: 39 %
Lymphs Abs: 4.5 10*3/uL — ABNORMAL HIGH (ref 0.7–4.0)
MCH: 32 pg (ref 26.0–34.0)
MCHC: 33.9 g/dL (ref 30.0–36.0)
MCV: 94.5 fL (ref 80.0–100.0)
Monocytes Absolute: 0.6 10*3/uL (ref 0.1–1.0)
Monocytes Relative: 5 %
Neutro Abs: 6.1 10*3/uL (ref 1.7–7.7)
Neutrophils Relative %: 54 %
Platelets: 389 10*3/uL (ref 150–400)
RBC: 4.53 MIL/uL (ref 3.87–5.11)
RDW: 13.4 % (ref 11.5–15.5)
WBC: 11.4 10*3/uL — ABNORMAL HIGH (ref 4.0–10.5)
nRBC: 0 % (ref 0.0–0.2)

## 2019-08-14 LAB — BASIC METABOLIC PANEL
Anion gap: 10 (ref 5–15)
BUN: 16 mg/dL (ref 6–20)
CO2: 26 mmol/L (ref 22–32)
Calcium: 9.5 mg/dL (ref 8.9–10.3)
Chloride: 101 mmol/L (ref 98–111)
Creatinine, Ser: 0.81 mg/dL (ref 0.44–1.00)
GFR calc Af Amer: >60 mL/min (ref >60)
GFR calc non Af Amer: >60 mL/min (ref >60)
Glucose, Bld: 98 mg/dL (ref 70–99)
Potassium: 4.2 mmol/L (ref 3.5–5.1)
Sodium: 137 mmol/L (ref 135–145)

## 2019-08-14 LAB — URINALYSIS, ROUTINE W REFLEX MICROSCOPIC
Bilirubin Urine: NEGATIVE
Glucose, UA: NEGATIVE mg/dL
Hgb urine dipstick: NEGATIVE
Ketones, ur: NEGATIVE mg/dL
Leukocytes,Ua: NEGATIVE
Nitrite: NEGATIVE
Protein, ur: NEGATIVE mg/dL
Specific Gravity, Urine: 1.019 (ref 1.005–1.030)
pH: 6 (ref 5.0–8.0)

## 2019-08-14 LAB — TROPONIN I (HIGH SENSITIVITY)
Troponin I (High Sensitivity): 9 ng/L (ref <18)
Troponin I (High Sensitivity): 8 ng/L (ref <18)

## 2019-08-14 LAB — D-DIMER, QUANTITATIVE: D-Dimer, Quant: 0.43 ug/mL-FEU (ref 0.00–0.50)

## 2019-08-14 MED ORDER — MECLIZINE HCL 25 MG PO TABS: 25.0000 mg | ORAL_TABLET | Freq: Three times a day (TID) | ORAL | 0 refills | Status: DC | PRN

## 2019-08-14 MED ORDER — MECLIZINE HCL 12.5 MG PO TABS
25.0000 mg | ORAL_TABLET | Freq: Once | ORAL | Status: AC
  Administered 2019-08-14: 25 mg via ORAL
  Filled 2019-08-14: qty 2

## 2019-08-14 MED ORDER — MORPHINE SULFATE (PF) 4 MG/ML IV SOLN
4.0000 mg | Freq: Once | INTRAVENOUS | Status: AC
  Administered 2019-08-14: 4 mg via INTRAVENOUS
  Filled 2019-08-14: qty 1

## 2019-08-14 MED ORDER — ONDANSETRON HCL 4 MG/2ML IJ SOLN
4.0000 mg | Freq: Once | INTRAMUSCULAR | Status: AC
  Administered 2019-08-14: 4 mg via INTRAVENOUS
  Filled 2019-08-14: qty 2

## 2019-08-14 MED ORDER — SODIUM CHLORIDE 0.9 % IV BOLUS
500.0000 mL | Freq: Once | INTRAVENOUS | Status: AC
  Administered 2019-08-14: 20:00:00 500 mL via INTRAVENOUS

## 2019-08-14 NOTE — ED Notes (Signed)
Pt is aware we need urine sample.

## 2019-08-14 NOTE — ED Provider Notes (Signed)
[facility] EMERGENCY DEPARTMENT Provider Note   CSN: [id number] Arrival date & time: 08/14/19  1511     History Chief Complaint  Patient presents with  . Chest Pain    Vanessa Bowen is a 44 y.o. female.  HPI      Vanessa Bowen is a 44 y.o. female who presents to the Emergency Department complaining of dizziness x4 days.  She reports intermittent episodes of dizziness associated with movement.  The dizziness became worse yesterday she endorses having several episodes of feeling lightheaded and "felt like I was going to fall."  Yesterday, she began having sharp "sticking" pains along her left chest that later developed into dull, tightness to her left chest that radiated into her left shoulder and left upper arm.  She also endorses worsening pain with movement of her left arm and with deep breathing.  She does report some shortness of breath with exertion.  She denies cough, fever, chills, known Covid exposures, abdominal pain, vomiting or diarrhea.  No flank pain.  She was found to have an elevated blood pressure and evaluated by cardiology in 2018 and had a negative stress test.  She has not required further cardiology follow-up since that time. No hx of DVT/PE   Past Medical History:  Diagnosis Date  . Anxiety   . Arthritis   . Chronic back pain   . Colitis, ulcerative ([place])   . Depression   . Elevated blood pressure, situational   . Hypothyroidism   . Migraine     Patient Active Problem List   Diagnosis Date Noted  . Vitamin D deficiency 10/18/2018  . Major depressive disorder, recurrent episode, mild ([place]) 08/23/2018  . Panic disorder 07/26/2018  . GAD (generalized anxiety disorder) 07/26/2018  . PTSD (post-traumatic stress disorder) 07/26/2018  . Class 1 obesity due to excess calories without serious comorbidity with body mass index (BMI) of 31.0 to 31.9 in adult 05/20/2017  . S/P vaginal hysterectomy 09/09/2016  . Colitis, ulcerative ([place]) 03/31/2016  . Other specified  hypothyroidism 03/18/2016  . WEIGHT LOSS, RECENT 09/23/2009  . NAUSEA WITH VOMITING 09/23/2009  . DIARRHEA, ACUTE 09/23/2009  . EPIGASTRIC PAIN 09/23/2009  . PUD, HX OF 09/23/2009  . ULCERATIVE COLITIS, HX OF 09/23/2009    Past Surgical History:  Procedure Laterality Date  . ANKLE SURGERY    . BREAST ENHANCEMENT SURGERY    . CESAREAN SECTION    . KNEE SURGERY    . LAPAROSCOPIC BILATERAL SALPINGECTOMY  07/27/2012   Procedure: LAPAROSCOPIC BILATERAL SALPINGECTOMY;  Surgeon: Florian Buff, MD;  Location: [facility];  Service: Gynecology;  Laterality: N/A;  . OOPHORECTOMY Right 09/09/2016   Procedure: RIGHT OOPHORECTOMY;  Surgeon: Florian Buff, MD;  Location: [facility];  Service: Gynecology;  Laterality: Right;  . OVARIAN CYST REMOVAL    . VAGINAL HYSTERECTOMY N/A 09/09/2016   Procedure: HYSTERECTOMY VAGINAL;  Surgeon: Florian Buff, MD;  Location: [facility];  Service: Gynecology;  Laterality: N/A;     OB History    Gravida  3   Para  2   Term  2   Preterm      AB  1   Living  2     SAB  1   TAB      Ectopic      Multiple      Live Births  2           Family History  Problem Relation Age of Onset  . Cancer Maternal Grandmother   .  Cancer Maternal Grandfather   . Alcohol abuse Maternal Grandfather   . Hypothyroidism Daughter   . Bipolar disorder Daughter   . Anxiety disorder Daughter   . Depression Mother   . Alcohol abuse Mother   . Drug abuse Sister   . Anxiety disorder Sister     Social History   Tobacco Use  . Smoking status: Current Every Day Smoker    Packs/day: 0.50    Years: 15.00    Pack years: 7.50    Types: Cigarettes  . Smokeless tobacco: Never Used  Substance Use Topics  . Alcohol use: Yes    Comment: Occasionally  . Drug use: No    Home Medications Prior to Admission medications   Medication Sig Start Date End Date Taking? Authorizing Provider  ALPRAZolam Duanne Moron) 1 MG tablet Take 1 tablet (1 mg total) by mouth 3 (three) times daily as  needed for anxiety. 06/28/19 12/25/19  Pucilowski, Marchia Bond, MD  conjugated estrogens (PREMARIN) vaginal cream USE 1 GRAM NIGHTLY AS DIRECTED 03/29/19   Florian Buff, MD  DIVIGEL 1 MG/GM GEL PLACE 1 PACKET ONTO THE SKIN DAILY. 03/29/19   Florian Buff, MD  DULoxetine (CYMBALTA) 60 MG capsule Take 1 capsule (60 mg total) by mouth 2 (two) times daily. 06/28/19 12/25/19  Pucilowski, Marchia Bond, MD  HYDROcodone-acetaminophen (NORCO/VICODIN) 5-325 MG tablet Take 1 tablet by mouth every 6 (six) hours as needed for moderate pain (Must last 30 days.). 08/01/19 08/31/19  Sanjuana Kava, MD  ibuprofen (ADVIL,MOTRIN) 200 MG tablet Take 400-600 mg by mouth every 8 (eight) hours as needed (for pain/headaches.).    [provider]  levothyroxine (SYNTHROID) 125 MCG tablet Take 1 tablet (125 mcg total) by mouth daily before breakfast. 04/20/19   Nida, Marella Chimes, MD  predniSONE (STERAPRED UNI-PAK 21 TAB) 5 MG (21) TBPK tablet Take 6 pills first day; 5 pills second day; 4 pills third day; 3 pills fourth day; 2 pills next day and 1 pill last day. Patient not taking: Reported on 07/06/2019 06/29/19   Sanjuana Kava, MD  tiZANidine (ZANAFLEX) 4 MG tablet Take 4-8 mg by mouth See admin instructions. 4 mg daily as needed for muscle spasms, tension, or mild anxiety & 4-8 mg at bedtime as needed for sleep/anxiety/spasms. 08/18/16   [provider]  traZODone (DESYREL) 100 MG tablet Take 2 tablets (200 mg total) by mouth at bedtime as needed for sleep. 06/07/19 09/05/19  Pucilowski, Marchia Bond, MD  Vitamin D, Ergocalciferol, (DRISDOL) 1.25 MG (50000 UT) CAPS capsule TAKE 1 CAPSULE (50,000 UNITS TOTAL) BY MOUTH EVERY 7 (SEVEN) DAYS. 12/12/18   Cassandria Anger, MD  dicyclomine (BENTYL) 20 MG tablet Take 1 tablet (20 mg total) by mouth 3 (three) times daily as needed for up to 30 days for spasms. 08/23/18 03/06/19  Pucilowski, Marchia Bond, MD    Allergies    Doxycycline and Sulfonamide derivatives  Review of Systems     Review of Systems  Constitutional: Negative for chills and fever.  Eyes: Negative for visual disturbance.  Respiratory: Positive for shortness of breath. Negative for cough and chest tightness.   Cardiovascular: Positive for chest pain.  Gastrointestinal: Positive for nausea. Negative for abdominal pain, diarrhea and vomiting.  Genitourinary: Negative for dysuria and urgency.  Musculoskeletal: Negative for arthralgias.  Skin: Negative for rash.  Neurological: Positive for dizziness. Negative for syncope, weakness and headaches.    Physical Exam Updated Vital Signs BP 116/60 (BP Location: Right Arm)   Pulse 84  Temp 98.2 F (36.8 C) (Oral)   Resp 18   Ht 5' 3"  (1.6 m)   Wt 77.6 kg   LMP 07/24/2016   SpO2 100%   BMI 30.29 kg/m   Physical Exam Vitals and nursing note reviewed.  Constitutional:      General: She is not in acute distress.    Appearance: Normal appearance. She is not ill-appearing.  HENT:     Head: Atraumatic.     Mouth/Throat:     Mouth: Mucous membranes are moist.     Pharynx: Oropharynx is clear.  Eyes:     Extraocular Movements: Extraocular movements intact.     Pupils: Pupils are equal, round, and reactive to light.  Cardiovascular:     Rate and Rhythm: Normal rate and regular rhythm.     Pulses: Normal pulses.  Pulmonary:     Effort: Pulmonary effort is normal.     Breath sounds: Normal breath sounds.  Chest:     Chest wall: No tenderness.  Abdominal:     General: There is no distension.     Palpations: Abdomen is soft.     Tenderness: There is no abdominal tenderness.  Musculoskeletal:        General: Normal range of motion.     Cervical back: Normal range of motion.  Skin:    General: Skin is warm.     Capillary Refill: Capillary refill takes less than 2 seconds.     Findings: No erythema or rash.  Neurological:     General: No focal deficit present.     Mental Status: She is alert.     GCS: GCS eye subscore is 4. GCS verbal subscore  is 5. GCS motor subscore is 6.     Sensory: Sensation is intact. No sensory deficit.     Motor: Motor function is intact. No weakness.     Coordination: Coordination is intact.     Comments: CN II-XII grossly intact.  No dysarthria or aphasia. No facial weakness     ED Results / Procedures / Treatments   Labs (all labs ordered are listed, but only abnormal results are displayed) Labs Reviewed  CBC WITH DIFFERENTIAL/PLATELET - Abnormal; Notable for the following components:      Result Value   WBC 11.4 (*)    Lymphs Abs 4.5 (*)    All other components within normal limits  URINALYSIS, ROUTINE W REFLEX MICROSCOPIC - Abnormal; Notable for the following components:   APPearance CLOUDY (*)    All other components within normal limits  BASIC METABOLIC PANEL  D-DIMER, QUANTITATIVE (NOT AT [facility])  TROPONIN I (HIGH SENSITIVITY)  TROPONIN I (HIGH SENSITIVITY)    EKG EKG Interpretation  Date/Time:  Monday August 14 2019 15:27:15 EST Ventricular Rate:  86 PR Interval:  136 QRS Duration: 78 QT Interval:  374 QTC Calculation: 447 R Axis:   -105 Text Interpretation: Normal sinus rhythm Right superior axis deviation Right ventricular hypertrophy Nonspecific ST abnormality Abnormal QRS-T angle, consider primary T wave abnormality Abnormal ECG Confirmed by Milton Ferguson [phone]) on 08/14/2019 8:25:49 PM   Radiology DG Chest 2 View  Result Date: 08/14/2019 CLINICAL DATA:  44 year old female with chest pain and shortness of breath. EXAM: CHEST - 2 VIEW COMPARISON:  Chest radiograph dated 01/21/2017. FINDINGS: The heart size and mediastinal contours are within normal limits. Both lungs are clear. The visualized skeletal structures are unremarkable. IMPRESSION: No active cardiopulmonary disease. Electronically Signed   By: Laren Everts.D.  On: 08/14/2019 15:56    Procedures Procedures (including critical care time)  Medications Ordered in ED Medications - No data to display  ED  Course  I have reviewed the triage vital signs and the nursing notes.  Pertinent labs & imaging results that were available during my care of the patient were reviewed by me and considered in my medical decision making (see chart for details).    MDM Rules/Calculators/A&P                     Pt with vertiginous symptoms for gradual onset for 4 days.  No focal neuro deficits.  No meningeal sings or headache.  No visual changes.   She also endorses left sided chest pain.  Dyspnea only with exertion.  PERC and d dimer neg.  delta troponin reassuring.  Discussed with Dr. Roderic Palau  Pt agrees to close out patient f/u with PCP and cards.  Strict return precautions also discussed.    Final Clinical Impression(s) / ED Diagnoses Final diagnoses:  Dizziness  Nonspecific chest pain    Rx / DC Orders ED Discharge Orders    None       Kem Parkinson, PA-C 08/16/19 1315    Milton Ferguson, MD 08/17/19 0730

## 2019-08-14 NOTE — Discharge Instructions (Addendum)
Take the meclizine as directed for the dizziness.  Drink plenty of fluids.  Follow-up with your primary doctor for recheck.  Also contact your cardiologist to arrange a follow-up appointment.

## 2019-08-14 NOTE — ED Triage Notes (Signed)
PT states she started having dizziness at times and SOB on exertion x4 days. Then yesterday started having left sided chest pain that radiates into her left arm and jaw.

## 2019-08-17 ENCOUNTER — Other Ambulatory Visit (HOSPITAL_COMMUNITY): Payer: Self-pay | Admitting: Psychiatry

## 2019-08-22 ENCOUNTER — Ambulatory Visit: Payer: 59 | Admitting: "Endocrinology

## 2019-08-23 LAB — TSH: TSH: 0.11 mIU/L — ABNORMAL LOW

## 2019-08-23 LAB — T4, FREE: Free T4: 1.7 ng/dL (ref 0.8–1.8)

## 2019-08-24 ENCOUNTER — Ambulatory Visit (INDEPENDENT_AMBULATORY_CARE_PROVIDER_SITE_OTHER): Payer: 59 | Admitting: "Endocrinology

## 2019-08-24 ENCOUNTER — Encounter: Payer: Self-pay | Admitting: "Endocrinology

## 2019-08-24 DIAGNOSIS — E038 Other specified hypothyroidism: Secondary | ICD-10-CM | POA: Diagnosis not present

## 2019-08-24 MED ORDER — LEVOTHYROXINE SODIUM 125 MCG PO TABS: 125.0000 ug | ORAL_TABLET | Freq: Every day | ORAL | 1 refills | Status: DC

## 2019-08-24 NOTE — Progress Notes (Signed)
08/24/2019                                            Endocrinology Telehealth Visit Follow up Note -During COVID -19 Pandemic  I connected with Vanessa Bowen on 08/24/2019   by telephone and verified that I am speaking with the correct person using two identifiers. Vanessa Bowen, [birth year]. she has verbally consented to this visit. All issues noted in this document were discussed and addressed. The format was not optimal for physical exam.   Subjective:    Patient ID: Vanessa Bowen, female    DOB: 1976-02-17, PCP Redmond School, MD   Past Medical History:  Diagnosis Date  . Anxiety   . Arthritis   . Chronic back pain   . Colitis, ulcerative ([place])   . Depression   . Elevated blood pressure, situational   . Hypothyroidism   . Migraine    Past Surgical History:  Procedure Laterality Date  . ANKLE SURGERY    . BREAST ENHANCEMENT SURGERY    . CESAREAN SECTION    . KNEE SURGERY    . LAPAROSCOPIC BILATERAL SALPINGECTOMY  07/27/2012   Procedure: LAPAROSCOPIC BILATERAL SALPINGECTOMY;  Surgeon: Florian Buff, MD;  Location: [facility];  Service: Gynecology;  Laterality: N/A;  . OOPHORECTOMY Right 09/09/2016   Procedure: RIGHT OOPHORECTOMY;  Surgeon: Florian Buff, MD;  Location: [facility];  Service: Gynecology;  Laterality: Right;  . OVARIAN CYST REMOVAL    . VAGINAL HYSTERECTOMY N/A 09/09/2016   Procedure: HYSTERECTOMY VAGINAL;  Surgeon: Florian Buff, MD;  Location: [facility];  Service: Gynecology;  Laterality: N/A;   Social History   Socioeconomic History  . Marital status: Married    Spouse name: Not on file  . Number of children: 2  . Years of education: Not on file  . Highest education level: Not on file  Occupational History  . Not on file  Tobacco Use  . Smoking status: Current Every Day Smoker    Packs/day: 0.50    Years: 15.00    Pack years: 7.50    Types: Cigarettes  . Smokeless tobacco: Never Used  Substance and Sexual Activity  . Alcohol use: Yes    Comment: Occasionally   . Drug use: No  . Sexual activity: Yes    Birth control/protection: Surgical    Comment: hyst  Other Topics Concern  . Not on file  Social History Narrative  . Not on file   Social Determinants of Health   Financial Resource Strain:   . Difficulty of Paying Living Expenses: Not on file  Food Insecurity:   . Worried About Charity fundraiser in the Last Year: Not on file  . Ran Out of Food in the Last Year: Not on file  Transportation Needs:   . Lack of Transportation (Medical): Not on file  . Lack of Transportation (Non-Medical): Not on file  Physical Activity:   . Days of Exercise per Week: Not on file  . Minutes of Exercise per Session: Not on file  Stress:   . Feeling of Stress : Not on file  Social Connections:   . Frequency of Communication with Friends and Family: Not on file  . Frequency of Social Gatherings with Friends and Family: Not on file  . Attends Religious Services: Not on file  . Active Member of  Clubs or Organizations: Not on file  . Attends Archivist Meetings: Not on file  . Marital Status: Not on file   Outpatient Encounter Medications as of 08/24/2019  Medication Sig  . ALPRAZolam (XANAX) 1 MG tablet Take 1 tablet (1 mg total) by mouth 3 (three) times daily as needed for anxiety.  . conjugated estrogens (PREMARIN) vaginal cream USE 1 GRAM NIGHTLY AS DIRECTED (Patient taking differently: Place 1 Applicatorful vaginally 2 (two) times a week. At bedtime`)  . DIVIGEL 1 MG/GM GEL PLACE 1 PACKET ONTO THE SKIN DAILY. (Patient taking differently: Apply 1 packet topically at bedtime. )  . DULoxetine (CYMBALTA) 60 MG capsule Take 1 capsule (60 mg total) by mouth 2 (two) times daily.  Marland Kitchen HYDROcodone-acetaminophen (NORCO/VICODIN) 5-325 MG tablet Take 1 tablet by mouth every 6 (six) hours as needed for moderate pain (Must last 30 days.).  Marland Kitchen ibuprofen (ADVIL,MOTRIN) 200 MG tablet Take 400-600 mg by mouth every 8 (eight) hours as needed (for pain/headaches.).   Marland Kitchen levothyroxine (SYNTHROID) 125 MCG tablet Take 1 tablet (125 mcg total) by mouth daily before breakfast.  . meclizine (ANTIVERT) 25 MG tablet Take 1 tablet (25 mg total) by mouth 3 (three) times daily as needed for dizziness.  . predniSONE (STERAPRED UNI-PAK 21 TAB) 5 MG (21) TBPK tablet Take 6 pills first day; 5 pills second day; 4 pills third day; 3 pills fourth day; 2 pills next day and 1 pill last day. (Patient not taking: Reported on 07/06/2019)  . tiZANidine (ZANAFLEX) 4 MG tablet Take 4-8 mg by mouth See admin instructions. 4 mg daily as needed for muscle spasms, tension, or mild anxiety & 4-8 mg at bedtime as needed for sleep/anxiety/spasms.  . traZODone (DESYREL) 100 MG tablet TAKE 2 TABLETS (200 MG TOTAL) BY MOUTH AT BEDTIME AS NEEDED FOR SLEEP.  . [DISCONTINUED] dicyclomine (BENTYL) 20 MG tablet Take 1 tablet (20 mg total) by mouth 3 (three) times daily as needed for up to 30 days for spasms.  . [DISCONTINUED] levothyroxine (SYNTHROID) 125 MCG tablet Take 1 tablet (125 mcg total) by mouth daily before breakfast.   No facility-administered encounter medications on file as of 08/24/2019.   ALLERGIES: Allergies  Allergen Reactions  . Doxycycline Nausea And Vomiting  . Sulfonamide Derivatives Nausea And Vomiting   VACCINATION STATUS:  There is no immunization history on file for this patient.  HPI  44 yr old female with medical hx as follows.  She is being engaged in telehealth for follow-up of her longstanding hypothyroidism.    She has hypothyroidism diagnosed at approximate age of 88 years.  She is currently on levothyroxine 137 mcg p.o. every morning.  She  reports better consistency taking her medication at this time.  She has no new complaints today.   She denies palpitations, tremors, nor heat/cold intolerance.  Review of Systems   Limited as above.  Objective:    LMP 07/24/2016   Wt Readings from Last 3 Encounters:  08/14/19 171 lb (77.6 kg)  08/01/19 171 lb 2 oz  (77.6 kg)  07/06/19 168 lb (76.2 kg)       CMP     Component Value Date/Time   NA 137 08/14/2019 1855   K 4.2 08/14/2019 1855   CL 101 08/14/2019 1855   CO2 26 08/14/2019 1855   GLUCOSE 98 08/14/2019 1855   BUN 16 08/14/2019 1855   CREATININE 0.81 08/14/2019 1855   CREATININE 1.05 04/27/2017 1226   CALCIUM 9.5 08/14/2019 1855  PROT 8.0 03/12/2018 1343   ALBUMIN 4.4 03/12/2018 1343   AST 18 03/12/2018 1343   ALT 21 03/12/2018 1343   ALKPHOS 78 03/12/2018 1343   BILITOT 0.4 03/12/2018 1343   GFRNONAA >60 08/14/2019 1855   GFRAA >60 08/14/2019 1855   Recent Results (from the past 2160 hour(s))  Basic metabolic panel     Status: None   Collection Time: 08/14/19  6:55 PM  Result Value Ref Range   Sodium 137 135 - 145 mmol/L   Potassium 4.2 3.5 - 5.1 mmol/L   Chloride 101 98 - 111 mmol/L   CO2 26 22 - 32 mmol/L   Glucose, Bld 98 70 - 99 mg/dL   BUN 16 6 - 20 mg/dL   Creatinine, Ser 0.81 0.44 - 1.00 mg/dL   Calcium 9.5 8.9 - 10.3 mg/dL   GFR calc non Af Amer >60 >60 mL/min   GFR calc Af Amer >60 >60 mL/min   Anion gap 10 5 - 15    Comment: Performed at [facility], [address] [place], [place] [address]  Troponin I (High Sensitivity)     Status: None   Collection Time: 08/14/19  6:55 PM  Result Value Ref Range   Troponin I (High Sensitivity) 9 <18 ng/L    Comment: (NOTE) Elevated high sensitivity troponin I (hsTnI) values and significant  changes across serial measurements may suggest ACS but many other  chronic and acute conditions are known to elevate hsTnI results.  Refer to the "Links" section for chest pain algorithms and additional  guidance. Performed at [facility], [address], [place], [place] [address]   CBC with Differential     Status: Abnormal   Collection Time: 08/14/19  6:55 PM  Result Value Ref Range   WBC 11.4 (H) 4.0 - 10.5 K/uL   RBC 4.53 3.87 - 5.11 MIL/uL   Hemoglobin 14.5 12.0 - 15.0 g/dL   HCT 42.8 36.0 - 46.0 %   MCV 94.5 80.0 -  100.0 fL   MCH 32.0 26.0 - 34.0 pg   MCHC 33.9 30.0 - 36.0 g/dL   RDW 13.4 11.5 - 15.5 %   Platelets 389 150 - 400 K/uL   nRBC 0.0 0.0 - 0.2 %   Neutrophils Relative % 54 %   Neutro Abs 6.1 1.7 - 7.7 K/uL   Lymphocytes Relative 39 %   Lymphs Abs 4.5 (H) 0.7 - 4.0 K/uL   Monocytes Relative 5 %   Monocytes Absolute 0.6 0.1 - 1.0 K/uL   Eosinophils Relative 1 %   Eosinophils Absolute 0.2 0.0 - 0.5 K/uL   Basophils Relative 1 %   Basophils Absolute 0.1 0.0 - 0.1 K/uL   Immature Granulocytes 0 %   Abs Immature Granulocytes 0.04 0.00 - 0.07 K/uL    Comment: Performed at [facility], [address] [place], [place] [address]  D-dimer, quantitative     Status: None   Collection Time: 08/14/19  6:55 PM  Result Value Ref Range   D-Dimer, Quant 0.43 0.00 - 0.50 ug/mL-FEU    Comment: (NOTE) At the manufacturer cut-off of 0.50 ug/mL FEU, this assay has been documented to exclude PE with a sensitivity and negative predictive value of 97 to 99%.  At this time, this assay has not been approved by the FDA to exclude DVT/VTE. Results should be correlated with clinical presentation. Performed at [facility], [address], [place], [place] [address]   Urinalysis, Routine w reflex microscopic  Status: Abnormal   Collection Time: 08/14/19  7:58 PM  Result Value Ref Range   Color, Urine YELLOW YELLOW   APPearance CLOUDY (A) CLEAR   Specific Gravity, Urine 1.019 1.005 - 1.030   pH 6.0 5.0 - 8.0   Glucose, UA NEGATIVE NEGATIVE mg/dL   Hgb urine dipstick NEGATIVE NEGATIVE   Bilirubin Urine NEGATIVE NEGATIVE   Ketones, ur NEGATIVE NEGATIVE mg/dL   Protein, ur NEGATIVE NEGATIVE mg/dL   Nitrite NEGATIVE NEGATIVE   Leukocytes,Ua NEGATIVE NEGATIVE    Comment: Performed at [facility], [address] [place], [place] [address]  Troponin I (High Sensitivity)     Status: None   Collection Time: 08/14/19  8:45 PM  Result Value Ref Range   Troponin I (High Sensitivity) 8 <18 ng/L    Comment:  (NOTE) Elevated high sensitivity troponin I (hsTnI) values and significant  changes across serial measurements may suggest ACS but many other  chronic and acute conditions are known to elevate hsTnI results.  Refer to the "Links" section for chest pain algorithms and additional  guidance. Performed at [facility], [address], [place], [place] [address]   TSH     Status: Abnormal   Collection Time: 08/22/19 11:28 AM  Result Value Ref Range   TSH 0.11 (L) mIU/L    Comment:           Reference Range .           > or = 20 Years  0.40-4.50 .                Pregnancy Ranges           First trimester    0.26-2.66           Second trimester   0.55-2.73           Third trimester    0.43-2.91   T4, free     Status: None   Collection Time: 08/22/19 11:28 AM  Result Value Ref Range   Free T4 1.7 0.8 - 1.8 ng/dL    Assessment & Plan:   1. hypothyroidism -Her previsit labs show slight over replacement.  However, she will continue to benefit from her current dose of levothyroxine.  She is advised to continue levothyroxine 125 mcg p.o. daily before breakfast.    - We discussed about the correct intake of her thyroid hormone, on empty stomach at fasting, with water, separated by at least 30 minutes from breakfast and other medications,  and separated by more than 4 hours from calcium, iron, multivitamins, acid reflux medications (PPIs). -Patient is made aware of the fact that thyroid hormone replacement is needed for life, dose to be adjusted by periodic monitoring of thyroid function tests.   - I advised patient to maintain close follow up with Redmond School, MD for primary care needs.     - Time spent on this patient care encounter:  15 minutes of which 50% was spent in  counseling and the rest reviewing  her current and  previous labs / studies and medications  doses and developing a plan for long term care. Vanessa Bowen  participated in the discussions, expressed understanding, and  voiced agreement with the above plans.  All questions were answered to her satisfaction. she is encouraged to contact clinic should she have any questions or concerns prior to her return visit.  Follow up plan: Return in about 3 months (around 11/24/2019) for Follow up with Pre-visit Labs.  Heriberto Antigua  Dorris Fetch, MD Phone: [phone]  Fax: [phone]  -  This note was partially dictated with voice recognition software. Similar sounding words can be transcribed inadequately or may not  be corrected upon review.  08/24/2019, 8:15 PM

## 2019-08-29 ENCOUNTER — Ambulatory Visit: Payer: 59 | Admitting: Orthopaedic Surgery

## 2019-08-29 ENCOUNTER — Ambulatory Visit (INDEPENDENT_AMBULATORY_CARE_PROVIDER_SITE_OTHER): Payer: 59 | Admitting: Psychiatry

## 2019-08-29 ENCOUNTER — Other Ambulatory Visit: Payer: Self-pay

## 2019-08-29 DIAGNOSIS — F330 Major depressive disorder, recurrent, mild: Secondary | ICD-10-CM | POA: Diagnosis not present

## 2019-08-29 DIAGNOSIS — F411 Generalized anxiety disorder: Secondary | ICD-10-CM | POA: Diagnosis not present

## 2019-08-29 DIAGNOSIS — F410 Panic disorder [episodic paroxysmal anxiety] without agoraphobia: Secondary | ICD-10-CM

## 2019-08-29 NOTE — Progress Notes (Signed)
[facility] MD/PA/NP OP Progress Note  08/29/2019 10:40 AM Vanessa Bowen  MRN:  [id number] Interview was conducted by phone and I verified that I was speaking with the correct person using two identifiers. I discussed the limitations of evaluation and management by telemedicine and  the availability of in person appointments. Patient expressed understanding and agreed to proceed.  Chief Complaint:  "I feel pretty stable at this point".  HPI: 44yo married female with panic disorder, GAD and MDD.She has beendepressed,anxious and having more problems with sleep due to stress related to [place]. Shereportedthat alprazolamworks better than clonazepamdidso we changed it back.We increased duloxetine and trazodone doses. [name] now reports feelingless depressed and less anxious now. She does not work at this time. Sleep adequate.Alprazolam used as needed but she takes it daily several times splitting tablets in half.Trazodone taken every night with good effect. No SI, normal appetite, no psychosis.Pain better controlled as she is back on hydrocodone/APAP.  Visit Diagnosis:    ICD-10-CM   1. GAD (generalized anxiety disorder)  F41.1   2. Major depressive disorder, recurrent episode, mild (HCC)  F33.0   3. Panic disorder  F41.0     Past Psychiatric History: Please see intake H&P.  Past Medical History:  Past Medical History:  Diagnosis Date  . Anxiety   . Arthritis   . Chronic back pain   . Colitis, ulcerative ([place])   . Depression   . Elevated blood pressure, situational   . Hypothyroidism   . Migraine     Past Surgical History:  Procedure Laterality Date  . ANKLE SURGERY    . BREAST ENHANCEMENT SURGERY    . CESAREAN SECTION    . KNEE SURGERY    . LAPAROSCOPIC BILATERAL SALPINGECTOMY  07/27/2012   Procedure: LAPAROSCOPIC BILATERAL SALPINGECTOMY;  Surgeon: Florian Buff, MD;  Location: [facility];  Service: Gynecology;  Laterality: N/A;  . OOPHORECTOMY Right 09/09/2016   Procedure: RIGHT  OOPHORECTOMY;  Surgeon: Florian Buff, MD;  Location: [facility];  Service: Gynecology;  Laterality: Right;  . OVARIAN CYST REMOVAL    . VAGINAL HYSTERECTOMY N/A 09/09/2016   Procedure: HYSTERECTOMY VAGINAL;  Surgeon: Florian Buff, MD;  Location: [facility];  Service: Gynecology;  Laterality: N/A;    Family Psychiatric History: Reviewed.  Family History:  Family History  Problem Relation Age of Onset  . Cancer Maternal Grandmother   . Cancer Maternal Grandfather   . Alcohol abuse Maternal Grandfather   . Hypothyroidism Daughter   . Bipolar disorder Daughter   . Anxiety disorder Daughter   . Depression Mother   . Alcohol abuse Mother   . Drug abuse Sister   . Anxiety disorder Sister     Social History:  Social History   Socioeconomic History  . Marital status: Married    Spouse name: Not on file  . Number of children: 2  . Years of education: Not on file  . Highest education level: Not on file  Occupational History  . Not on file  Tobacco Use  . Smoking status: Current Every Day Smoker    Packs/day: 0.50    Years: 15.00    Pack years: 7.50    Types: Cigarettes  . Smokeless tobacco: Never Used  Substance and Sexual Activity  . Alcohol use: Yes    Comment: Occasionally  . Drug use: No  . Sexual activity: Yes    Birth control/protection: Surgical    Comment: hyst  Other Topics Concern  . Not on file  Social  History Narrative  . Not on file   Social Determinants of Health   Financial Resource Strain:   . Difficulty of Paying Living Expenses: Not on file  Food Insecurity:   . Worried About Charity fundraiser in the Last Year: Not on file  . Ran Out of Food in the Last Year: Not on file  Transportation Needs:   . Lack of Transportation (Medical): Not on file  . Lack of Transportation (Non-Medical): Not on file  Physical Activity:   . Days of Exercise per Week: Not on file  . Minutes of Exercise per Session: Not on file  Stress:   . Feeling of Stress : Not on file   Social Connections:   . Frequency of Communication with Friends and Family: Not on file  . Frequency of Social Gatherings with Friends and Family: Not on file  . Attends Religious Services: Not on file  . Active Member of Clubs or Organizations: Not on file  . Attends Archivist Meetings: Not on file  . Marital Status: Not on file    Allergies:  Allergies  Allergen Reactions  . Doxycycline Nausea And Vomiting  . Sulfonamide Derivatives Nausea And Vomiting    Metabolic Disorder Labs: Lab Results  Component Value Date   HGBA1C 5.2 04/27/2017   MPG 103 04/27/2017   MPG 100 07/21/2016   No results found for: PROLACTIN No results found for: CHOL, TRIG, HDL, CHOLHDL, VLDL, LDLCALC Lab Results  Component Value Date   TSH 0.11 (L) 08/22/2019   TSH 0.06 (L) 04/12/2019    Therapeutic Level Labs: No results found for: LITHIUM No results found for: VALPROATE No components found for:  CBMZ  Current Medications: Current Outpatient Medications  Medication Sig Dispense Refill  . ALPRAZolam (XANAX) 1 MG tablet Take 1 tablet (1 mg total) by mouth 3 (three) times daily as needed for anxiety. 90 tablet 5  . conjugated estrogens (PREMARIN) vaginal cream USE 1 GRAM NIGHTLY AS DIRECTED (Patient taking differently: Place 1 Applicatorful vaginally 2 (two) times a week. At bedtime`) 90 g 4  . DIVIGEL 1 MG/GM GEL PLACE 1 PACKET ONTO THE SKIN DAILY. (Patient taking differently: Apply 1 packet topically at bedtime. ) 1 g 3  . DULoxetine (CYMBALTA) 60 MG capsule Take 1 capsule (60 mg total) by mouth 2 (two) times daily. 180 capsule 1  . HYDROcodone-acetaminophen (NORCO/VICODIN) 5-325 MG tablet Take 1 tablet by mouth every 6 (six) hours as needed for moderate pain (Must last 30 days.). 60 tablet 0  . ibuprofen (ADVIL,MOTRIN) 200 MG tablet Take 400-600 mg by mouth every 8 (eight) hours as needed (for pain/headaches.).    Marland Kitchen levothyroxine (SYNTHROID) 125 MCG tablet Take 1 tablet (125 mcg  total) by mouth daily before breakfast. 90 tablet 1  . meclizine (ANTIVERT) 25 MG tablet Take 1 tablet (25 mg total) by mouth 3 (three) times daily as needed for dizziness. 30 tablet 0  . predniSONE (STERAPRED UNI-PAK 21 TAB) 5 MG (21) TBPK tablet Take 6 pills first day; 5 pills second day; 4 pills third day; 3 pills fourth day; 2 pills next day and 1 pill last day. (Patient not taking: Reported on 07/06/2019) 21 tablet 0  . tiZANidine (ZANAFLEX) 4 MG tablet Take 4-8 mg by mouth See admin instructions. 4 mg daily as needed for muscle spasms, tension, or mild anxiety & 4-8 mg at bedtime as needed for sleep/anxiety/spasms.    . traZODone (DESYREL) 100 MG tablet TAKE 2 TABLETS (200  MG TOTAL) BY MOUTH AT BEDTIME AS NEEDED FOR SLEEP. 180 tablet 0   No current facility-administered medications for this visit.     Psychiatric Specialty Exam: Review of Systems  Musculoskeletal: Positive for back pain.  Psychiatric/Behavioral: The patient is nervous/anxious.   All other systems reviewed and are negative.   Last menstrual period 07/24/2016.There is no height or weight on file to calculate BMI.  General Appearance: NA  Eye Contact:  NA  Speech:  Clear and Coherent and Normal Rate  Volume:  Normal  Mood:  Anxious  Affect:  NA  Thought Process:  Goal Directed and Linear  Orientation:  Full (Time, Place, and Person)  Thought Content: Logical   Suicidal Thoughts:  No  Homicidal Thoughts:  No  Memory:  Immediate;   Good Recent;   Good Remote;   Good  Judgement:  Good  Insight:  Good  Psychomotor Activity:  NA  Concentration:  Concentration: Good  Recall:  Good  Fund of Knowledge: Good  Language: Good  Akathisia:  Negative  Handed:  Right  AIMS (if indicated): not done  Assets:  Communication Skills Desire for Improvement Financial Resources/Insurance Housing Social Support  ADL's:  Intact  Cognition: WNL  Sleep:  Good   Screenings: PHQ2-9     Office Visit from 03/18/2016 in  [facility]  PHQ-2 Total Score  0       Assessment and Plan: 44yo married female with panic disorder, GAD and MDD.She has beendepressed,anxious and having more problems with sleep due to stress related to [place]. Shereportedthat alprazolamworks better than clonazepamdidso we changed it back.We increased duloxetine and trazodone doses. Toneisha now reports feelingless depressed and less anxious now. She does not work at this time. Sleep adequate.Alprazolam used as needed but she takes it daily several times splitting tablets in half.Trazodone taken every night with good effect. No SI, normal appetite, no psychosis.Pain better controlled as she is back on hydrocodone/APAP.  Dx: Panic disorder; GAD; MDD recurrent mild  Plan:Continue duloxetine 60 mg bid, alprazolam 1 mg tid prn anxiety and trazodone 200 mg at HS for sleep.The plan was discussed with patient who had an opportunity to ask questions and these were all answered. I spend20 minutes inphone contact with patient. Next appointment in37month.    OStephanie Acre MD 08/29/2019, 10:40 AM

## 2019-08-31 ENCOUNTER — Other Ambulatory Visit: Payer: Self-pay

## 2019-08-31 ENCOUNTER — Encounter: Payer: Self-pay | Admitting: Orthopaedic Surgery

## 2019-08-31 ENCOUNTER — Ambulatory Visit (INDEPENDENT_AMBULATORY_CARE_PROVIDER_SITE_OTHER): Payer: 59 | Admitting: Orthopaedic Surgery

## 2019-08-31 VITALS — Ht 63.0 in | Wt 170.0 lb

## 2019-08-31 DIAGNOSIS — G8929 Other chronic pain: Secondary | ICD-10-CM | POA: Diagnosis not present

## 2019-08-31 DIAGNOSIS — F17210 Nicotine dependence, cigarettes, uncomplicated: Secondary | ICD-10-CM | POA: Diagnosis not present

## 2019-08-31 DIAGNOSIS — M545 Low back pain, unspecified: Secondary | ICD-10-CM

## 2019-08-31 DIAGNOSIS — G894 Chronic pain syndrome: Secondary | ICD-10-CM

## 2019-08-31 MED ORDER — HYDROCODONE-ACETAMINOPHEN 5-325 MG PO TABS: 1.0000 | ORAL_TABLET | Freq: Four times a day (QID) | ORAL | 0 refills | Status: DC | PRN

## 2019-08-31 NOTE — Progress Notes (Signed)
Patient Vanessa Bowen, female DOB:03-11-1976, 44 y.o. OAC:[id number]  Chief Complaint  Patient presents with  . Back Pain    HPI  Vanessa Bowen is a 44 y.o. female who has lower back pain with some right sided sciatica.     Body mass index is 30.11 kg/m.  ROS  Review of Systems  HENT: Negative for congestion.   Respiratory: Negative for cough and shortness of breath.   Cardiovascular: Negative for chest pain and leg swelling.  Endocrine: Positive for cold intolerance.  Musculoskeletal: Positive for arthralgias and back pain.  Allergic/Immunologic: Positive for environmental allergies.  Neurological: Positive for headaches.  Psychiatric/Behavioral: The patient is nervous/anxious.   All other systems reviewed and are negative.   All other systems reviewed and are negative.  The following is a summary of the past history medically, past history surgically, known current medicines, social history and family history.  This information is gathered electronically by the computer from prior information and documentation.  I review this each visit and have found including this information at this point in the chart is beneficial and informative.    Past Medical History:  Diagnosis Date  . Anxiety   . Arthritis   . Chronic back pain   . Colitis, ulcerative ([place])   . Depression   . Elevated blood pressure, situational   . Hypothyroidism   . Migraine     Past Surgical History:  Procedure Laterality Date  . ANKLE SURGERY    . BREAST ENHANCEMENT SURGERY    . CESAREAN SECTION    . KNEE SURGERY    . LAPAROSCOPIC BILATERAL SALPINGECTOMY  07/27/2012   Procedure: LAPAROSCOPIC BILATERAL SALPINGECTOMY;  Surgeon: Florian Buff, MD;  Location: [facility];  Service: Gynecology;  Laterality: N/A;  . OOPHORECTOMY Right 09/09/2016   Procedure: RIGHT OOPHORECTOMY;  Surgeon: Florian Buff, MD;  Location: [facility];  Service: Gynecology;  Laterality: Right;  . OVARIAN CYST REMOVAL    . VAGINAL  HYSTERECTOMY N/A 09/09/2016   Procedure: HYSTERECTOMY VAGINAL;  Surgeon: Florian Buff, MD;  Location: [facility];  Service: Gynecology;  Laterality: N/A;    Family History  Problem Relation Age of Onset  . Cancer Maternal Grandmother   . Cancer Maternal Grandfather   . Alcohol abuse Maternal Grandfather   . Hypothyroidism Daughter   . Bipolar disorder Daughter   . Anxiety disorder Daughter   . Depression Mother   . Alcohol abuse Mother   . Drug abuse Sister   . Anxiety disorder Sister     Social History Social History   Tobacco Use  . Smoking status: Current Every Day Smoker    Packs/day: 0.50    Years: 15.00    Pack years: 7.50    Types: Cigarettes  . Smokeless tobacco: Never Used  Substance Use Topics  . Alcohol use: Yes    Comment: Occasionally  . Drug use: No    Allergies  Allergen Reactions  . Doxycycline Nausea And Vomiting  . Sulfonamide Derivatives Nausea And Vomiting    Current Outpatient Medications  Medication Sig Dispense Refill  . ALPRAZolam (XANAX) 1 MG tablet Take 1 tablet (1 mg total) by mouth 3 (three) times daily as needed for anxiety. 90 tablet 5  . conjugated estrogens (PREMARIN) vaginal cream USE 1 GRAM NIGHTLY AS DIRECTED (Patient taking differently: Place 1 Applicatorful vaginally 2 (two) times a week. At bedtime`) 90 g 4  . DIVIGEL 1 MG/GM GEL PLACE 1 PACKET ONTO THE SKIN DAILY. (Patient taking differently:  Apply 1 packet topically at bedtime. ) 1 g 3  . DULoxetine (CYMBALTA) 60 MG capsule Take 1 capsule (60 mg total) by mouth 2 (two) times daily. 180 capsule 1  . HYDROcodone-acetaminophen (NORCO/VICODIN) 5-325 MG tablet Take 1 tablet by mouth every 6 (six) hours as needed for moderate pain (Must last 30 days.). 60 tablet 0  . ibuprofen (ADVIL,MOTRIN) 200 MG tablet Take 400-600 mg by mouth every 8 (eight) hours as needed (for pain/headaches.).    Marland Kitchen levothyroxine (SYNTHROID) 125 MCG tablet Take 1 tablet (125 mcg total) by mouth daily before  breakfast. 90 tablet 1  . tiZANidine (ZANAFLEX) 4 MG tablet Take 4-8 mg by mouth See admin instructions. 4 mg daily as needed for muscle spasms, tension, or mild anxiety & 4-8 mg at bedtime as needed for sleep/anxiety/spasms.    . traZODone (DESYREL) 100 MG tablet TAKE 2 TABLETS (200 MG TOTAL) BY MOUTH AT BEDTIME AS NEEDED FOR SLEEP. 180 tablet 0  . meclizine (ANTIVERT) 25 MG tablet Take 1 tablet (25 mg total) by mouth 3 (three) times daily as needed for dizziness. (Patient not taking: Reported on 08/31/2019) 30 tablet 0   No current facility-administered medications for this visit.     Physical Exam  Height 5' 3"  (1.6 m), weight 170 lb (77.1 kg), last menstrual period 07/24/2016.  Constitutional: overall normal hygiene, normal nutrition, well developed, normal grooming, normal body habitus. Assistive device:none  Musculoskeletal: gait and station Limp none, muscle tone and strength are normal, no tremors or atrophy is present.  .  Neurological: coordination overall normal.  Deep tendon reflex/nerve stretch intact.  Sensation normal.  Cranial nerves II-XII intact.   Skin:   Normal overall no scars, lesions, ulcers or rashes. No psoriasis.  Psychiatric: Alert and oriented x 3.  Recent memory intact, remote memory unclear.  Normal mood and affect. Well groomed.  Good eye contact.  Cardiovascular: overall no swelling, no varicosities, no edema bilaterally, normal temperatures of the legs and arms, no clubbing, cyanosis and good capillary refill.  Lymphatic: palpation is normal.  Spine/Pelvis examination:  Inspection:  Overall, sacoiliac joint benign and hips nontender; without crepitus or defects.   Thoracic spine inspection: Alignment normal without kyphosis present   Lumbar spine inspection:  Alignment  with normal lumbar lordosis, without scoliosis apparent.   Thoracic spine palpation:  without tenderness of spinal processes   Lumbar spine palpation: without tenderness of lumbar  area; without tightness of lumbar muscles    Range of Motion:   Lumbar flexion, forward flexion is normal without pain or tenderness    Lumbar extension is full without pain or tenderness   Left lateral bend is normal without pain or tenderness   Right lateral bend is normal without pain or tenderness   Straight leg raising is normal  Strength & tone: normal   Stability overall normal stability  All other systems reviewed and are negative   The patient has been educated about the nature of the problem(s) and counseled on treatment options.  The patient appeared to understand what I have discussed and is in agreement with it.  Encounter Diagnoses  Name Primary?  . Chronic midline low back pain without sciatica Yes  . Chronic pain syndrome   . Cigarette nicotine dependence without complication     PLAN Call if any problems.  Precautions discussed.  Continue current medications.   Return to clinic 1 month   I have reviewed the [place] web site prior  to prescribing narcotic medicine for this patient.   Electronically Signed Sanjuana Kava, MD 3/11/202110:43 AM

## 2019-08-31 NOTE — Patient Instructions (Signed)
Steps to Quit Smoking Smoking tobacco is the leading cause of preventable death. It can affect almost every organ in the body. Smoking puts you and people around you at risk for many serious, long-lasting (chronic) diseases. Quitting smoking can be hard, but it is one of the best things that you can do for your health. It is never too late to quit. How do I get ready to quit? When you decide to quit smoking, make a plan to help you succeed. Before you quit:  Pick a date to quit. Set a date within the next 2 weeks to give you time to prepare.  Write down the reasons why you are quitting. Keep this list in places where you will see it often.  Tell your family, friends, and co-workers that you are quitting. Their support is important.  Talk with your doctor about the choices that may help you quit.  Find out if your health insurance will pay for these treatments.  Know the people, places, things, and activities that make you want to smoke (triggers). Avoid them. What first steps can I take to quit smoking?  Throw away all cigarettes at home, at work, and in your car.  Throw away the things that you use when you smoke, such as ashtrays and lighters.  Clean your car. Make sure to empty the ashtray.  Clean your home, including curtains and carpets. What can I do to help me quit smoking? Talk with your doctor about taking medicines and seeing a counselor at the same time. You are more likely to succeed when you do both.  If you are pregnant or breastfeeding, talk with your doctor about counseling or other ways to quit smoking. Do not take medicine to help you quit smoking unless your doctor tells you to do so. To quit smoking: Quit right away  Quit smoking totally, instead of slowly cutting back on how much you smoke over a period of time.  Go to counseling. You are more likely to quit if you go to counseling sessions regularly. Take medicine You may take medicines to help you quit. Some  medicines need a prescription, and some you can buy over-the-counter. Some medicines may contain a drug called nicotine to replace the nicotine in cigarettes. Medicines may:  Help you to stop having the desire to smoke (cravings).  Help to stop the problems that come when you stop smoking (withdrawal symptoms). Your doctor may ask you to use:  Nicotine patches, gum, or lozenges.  Nicotine inhalers or sprays.  Non-nicotine medicine that is taken by mouth. Find resources Find resources and other ways to help you quit smoking and remain smoke-free after you quit. These resources are most helpful when you use them often. They include:  Online chats with a counselor.  Phone quitlines.  Printed self-help materials.  Support groups or group counseling.  Text messaging programs.  Mobile phone apps. Use apps on your mobile phone or tablet that can help you stick to your quit plan. There are many free apps for mobile phones and tablets as well as websites. Examples include Quit Guide from the CDC and smokefree.gov  What things can I do to make it easier to quit?   Talk to your family and friends. Ask them to support and encourage you.  Call a phone quitline (1-800-QUIT-NOW), reach out to support groups, or work with a counselor.  Ask people who smoke to not smoke around you.  Avoid places that make you want to smoke,   such as: ? Bars. ? Parties. ? Smoke-break areas at work.  Spend time with people who do not smoke.  Lower the stress in your life. Stress can make you want to smoke. Try these things to help your stress: ? Getting regular exercise. ? Doing deep-breathing exercises. ? Doing yoga. ? Meditating. ? Doing a body scan. To do this, close your eyes, focus on one area of your body at a time from head to toe. Notice which parts of your body are tense. Try to relax the muscles in those areas. How will I feel when I quit smoking? Day 1 to 3 weeks Within the first 24 hours,  you may start to have some problems that come from quitting tobacco. These problems are very bad 2-3 days after you quit, but they do not often last for more than 2-3 weeks. You may get these symptoms:  Mood swings.  Feeling restless, nervous, angry, or annoyed.  Trouble concentrating.  Dizziness.  Strong desire for high-sugar foods and nicotine.  Weight gain.  Trouble pooping (constipation).  Feeling like you may vomit (nausea).  Coughing or a sore throat.  Changes in how the medicines that you take for other issues work in your body.  Depression.  Trouble sleeping (insomnia). Week 3 and afterward After the first 2-3 weeks of quitting, you may start to notice more positive results, such as:  Better sense of smell and taste.  Less coughing and sore throat.  Slower heart rate.  Lower blood pressure.  Clearer skin.  Better breathing.  Fewer sick days. Quitting smoking can be hard. Do not give up if you fail the first time. Some people need to try a few times before they succeed. Do your best to stick to your quit plan, and talk with your doctor if you have any questions or concerns. Summary  Smoking tobacco is the leading cause of preventable death. Quitting smoking can be hard, but it is one of the best things that you can do for your health.  When you decide to quit smoking, make a plan to help you succeed.  Quit smoking right away, not slowly over a period of time.  When you start quitting, seek help from your doctor, family, or friends. This information is not intended to replace advice given to you by your health care provider. Make sure you discuss any questions you have with your health care provider. Document Revised: 03/03/2019 Document Reviewed: 08/27/2018 Elsevier Patient Education  2020 [employer] Inc.  

## 2019-09-07 ENCOUNTER — Encounter: Payer: Self-pay | Admitting: Obstetrics and Gynecology

## 2019-09-07 ENCOUNTER — Other Ambulatory Visit: Payer: Self-pay

## 2019-09-07 ENCOUNTER — Ambulatory Visit (INDEPENDENT_AMBULATORY_CARE_PROVIDER_SITE_OTHER): Payer: 59 | Admitting: Obstetrics and Gynecology

## 2019-09-07 VITALS — BP 113/75 | HR 89 | Ht 63.0 in | Wt 176.6 lb

## 2019-09-07 DIAGNOSIS — N941 Unspecified dyspareunia: Secondary | ICD-10-CM

## 2019-09-07 DIAGNOSIS — R102 Pelvic and perineal pain: Secondary | ICD-10-CM | POA: Diagnosis not present

## 2019-09-07 MED ORDER — HYDROCODONE-ACETAMINOPHEN 5-325 MG PO TABS: 1.0000 | ORAL_TABLET | Freq: Four times a day (QID) | ORAL | 0 refills | Status: DC | PRN
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
## 2019-09-13 ENCOUNTER — Other Ambulatory Visit: Payer: Self-pay | Admitting: Obstetrics and Gynecology

## 2019-09-13 DIAGNOSIS — N941 Unspecified dyspareunia: Secondary | ICD-10-CM

## 2019-09-13 DIAGNOSIS — R102 Pelvic and perineal pain: Secondary | ICD-10-CM

## 2019-09-14 ENCOUNTER — Other Ambulatory Visit: Payer: Self-pay

## 2019-09-14 ENCOUNTER — Ambulatory Visit (INDEPENDENT_AMBULATORY_CARE_PROVIDER_SITE_OTHER): Payer: 59

## 2019-09-14 DIAGNOSIS — N941 Unspecified dyspareunia: Secondary | ICD-10-CM

## 2019-09-14 DIAGNOSIS — R1032 Left lower quadrant pain: Secondary | ICD-10-CM

## 2019-09-14 DIAGNOSIS — R102 Pelvic and perineal pain unspecified side: Secondary | ICD-10-CM

## 2019-09-14 NOTE — Progress Notes (Signed)
PELVIC US TA/TV: normal vaginal cuff,simple cul de sac fluid,right oophorectomy,unable to visualize left ovary and right adnexa because of bowel gas,left adnexal pain during ultrasound  Chaperone Peggy

## 2019-09-19 NOTE — Progress Notes (Signed)
Thickness at vaginal cuff, unable to see left ovary. This thickness may represent ovarian attachment to vaginal cuff. Need to discuss with patient by phone or in person. Consider Laparoscopy, with probable removal of left tube and ovary

## 2019-09-20 NOTE — Progress Notes (Signed)
Patient ID: Alima Naser, female   DOB: [birth year], 44 y.o.   MRN: [id number]    [facility] Visit  09/21/19     Patient name: Vanessa Bowen MRN [id number]  Date of birth: Nov 03, 1975  CC & HPI:  Vanessa Bowen is a 44 y.o. female presenting today for follow up of TV ultrasound results and LLQ pain.   She notes that she has been experiencing significant pain, which she describes as stabbing. She remember that she had been experiencing significant pain since she went to the ER on 03/12/2018, but that her abdomen and pelvis ultrasound revealed nonvisualization of the left ovary due to overlying bowel gas.   ROS:  Review of Systems  Constitutional: Negative for diaphoresis, fever, malaise/fatigue and weight loss.  HENT: Negative for congestion and sore throat.   Eyes: Negative for blurred vision and double vision.  Respiratory: Negative for cough and shortness of breath.   Cardiovascular: Negative for chest pain, palpitations and leg swelling.  Gastrointestinal: Negative for constipation, diarrhea, nausea and vomiting.  Genitourinary: Positive for frequency. Negative for urgency.  Musculoskeletal: Positive for myalgias. Negative for back pain and falls.  Skin: Negative for rash.  Neurological: Negative for dizziness, weakness and headaches.  Psychiatric/Behavioral: Negative for depression. The patient is not nervous/anxious.     TV Ultrasound 09/14/2019 showed: Uterus                     Surgically removed,normal vaginal cuff Endometrium           n/a Right ovary              Right oophorectomy,limited view of adnexa because of bowel gas Left ovary                Not visualized because of bowel gas  Simple cul de sac fluid Technician Comments: PELVIC US TA/TV: normal vaginal cuff, simple cul de sac fluid, right oophorectomy, unable to visualize left ovary and right adnexa because of bowel gas, left adnexal pain during ultrasound. It may be that the cuff thickness is actually the left  ovary with adhesions.  Urinalysis    Component Value Date/Time   COLORURINE YELLOW 08/14/2019 1958   APPEARANCEUR CLOUDY (A) 08/14/2019 1958   LABSPEC 1.019 08/14/2019 1958   PHURINE 6.0 08/14/2019 1958   GLUCOSEU NEGATIVE 08/14/2019 1958   HGBUR NEGATIVE 08/14/2019 1958   BILIRUBINUR NEGATIVE 08/14/2019 1958   KETONESUR NEGATIVE 08/14/2019 1958   PROTEINUR NEGATIVE 08/14/2019 1958   UROBILINOGEN 0.2 01/02/2015 0030   NITRITE NEGATIVE 08/14/2019 1958   LEUKOCYTESUR NEGATIVE 08/14/2019 1958    Pertinent History Reviewed:   Reviewed: Significant for single vaginal delivery and single cesarean section.  Medical         Past Medical History:  Diagnosis Date  . Anxiety   . Arthritis   . Chronic back pain   . Colitis, ulcerative ([place])   . Depression   . Elevated blood pressure, situational   . Hypothyroidism   . Migraine                               Surgical Hx:    Past Surgical History:  Procedure Laterality Date  . ANKLE SURGERY    . BREAST ENHANCEMENT SURGERY    . CESAREAN SECTION    . KNEE SURGERY    . LAPAROSCOPIC BILATERAL SALPINGECTOMY  07/27/2012   Procedure: LAPAROSCOPIC BILATERAL SALPINGECTOMY;  Surgeon: Florian Buff, MD;  Location: [facility];  Service: Gynecology;  Laterality: N/A;  . OOPHORECTOMY Right 09/09/2016   Procedure: RIGHT OOPHORECTOMY;  Surgeon: Florian Buff, MD;  Location: [facility];  Service: Gynecology;  Laterality: Right;  . OVARIAN CYST REMOVAL    . VAGINAL HYSTERECTOMY N/A 09/09/2016   Procedure: HYSTERECTOMY VAGINAL;  Surgeon: Florian Buff, MD;  Location: [facility];  Service: Gynecology;  Laterality: N/A;   Medications: Reviewed & Updated - see associated section                       Current Outpatient Medications:  .  ALPRAZolam (XANAX) 1 MG tablet, Take 1 tablet (1 mg total) by mouth 3 (three) times daily as needed for anxiety., Disp: 90 tablet, Rfl: 5 .  conjugated estrogens (PREMARIN) vaginal cream, USE 1 GRAM NIGHTLY AS DIRECTED (Patient taking  differently: Place 1 Applicatorful vaginally 2 (two) times a week. At bedtime`), Disp: 90 g, Rfl: 4 .  DIVIGEL 1 MG/GM GEL, PLACE 1 PACKET ONTO THE SKIN DAILY. (Patient taking differently: Apply 1 packet topically at bedtime. ), Disp: 1 g, Rfl: 3 .  DULoxetine (CYMBALTA) 60 MG capsule, Take 1 capsule (60 mg total) by mouth 2 (two) times daily., Disp: 180 capsule, Rfl: 1 .  HYDROcodone-acetaminophen (NORCO/VICODIN) 5-325 MG tablet, Take 1 tablet by mouth every 6 (six) hours as needed for up to 15 days for severe pain. Adding one vicodin daily to current dosing., Disp: 15 tablet, Rfl: 0 .  ibuprofen (ADVIL,MOTRIN) 200 MG tablet, Take 400-600 mg by mouth every 8 (eight) hours as needed (for pain/headaches.)., Disp: , Rfl:  .  levothyroxine (SYNTHROID) 125 MCG tablet, Take 1 tablet (125 mcg total) by mouth daily before breakfast., Disp: 90 tablet, Rfl: 1 .  tiZANidine (ZANAFLEX) 4 MG tablet, Take 4-8 mg by mouth See admin instructions. 4 mg daily as needed for muscle spasms, tension, or mild anxiety & 4-8 mg at bedtime as needed for sleep/anxiety/spasms., Disp: , Rfl:  .  traZODone (DESYREL) 100 MG tablet, TAKE 2 TABLETS (200 MG TOTAL) BY MOUTH AT BEDTIME AS NEEDED FOR SLEEP., Disp: 180 tablet, Rfl: 0   Social History: Reviewed -  reports that she has been smoking cigarettes. She has a 7.50 pack-year smoking history. She has never used smokeless tobacco.  Objective Findings:  Vitals: Blood pressure 125/72, pulse 67, height 5' 3"  (1.6 m), weight 171 lb 6.4 oz (77.7 kg), last menstrual period 07/24/2016.  PHYSICAL EXAMINATION General appearance - oriented to person, place, and time, normal appearing weight, in mild to moderate distress and limping due to pain Mental status - alert, oriented to person, place, and time, depressed mood, anxious Chest - not examined Heart - not examined Abdomen - soft, nontender, nondistended, no masses or organomegaly Breasts - breasts appear normal, no suspicious  masses, no skin or nipple changes or axillary nodes, not examined Skin - normal coloration and turgor, no rashes, no suspicious skin lesions noted   PELVIC External genitalia - normal EFG Vulva - normal Vagina - good support Cervix - removed  Uterus - *removed {surgically absent cuff is tender to light touch, leaves a dull lingering pain, suggesting slow pain fibers such as ovarian Adnexa - no masses [place] - n/a Rectal - rectal exam not indicated   Assessment & Plan:   A:   Risks and benefits of ovarian removal discussed including but not limited to: increased risk of bleeding due to additional  surgery,  immediate menopause, increased risk of all cause mortality, mood swings, night sweats, hot flashes, bone loss, loss of cardioprotection all discussed.  Patient still desires removal of ovaries.  Likelihood of success of surgery in ending pelvic pain is roughly 60%. Medication management for pain discussed.  5 P:  1. Schedule laparoscopic removal of left ovary  2. 30 Vicodin 98m prescribed for pain management until surgery add one per day to current regimen.     By signing my name below, I, [employer] attest that this documentation has been prepared under the direction and in the presence of FJonnie Kind MD. Electronically Signed: [place] 09/21/19. 11:37 AM.  I personally performed the services described in this documentation, which was SCRIBED in my presence. The recorded information has been reviewed and considered accurate. It has been edited as necessary during review. JJonnie Kind MD

## 2019-09-21 ENCOUNTER — Ambulatory Visit (INDEPENDENT_AMBULATORY_CARE_PROVIDER_SITE_OTHER): Payer: 59 | Admitting: Obstetrics and Gynecology

## 2019-09-21 ENCOUNTER — Encounter: Payer: Self-pay | Admitting: Obstetrics and Gynecology

## 2019-09-21 ENCOUNTER — Other Ambulatory Visit: Payer: Self-pay

## 2019-09-21 VITALS — BP 125/72 | HR 67 | Ht 63.0 in | Wt 171.4 lb

## 2019-09-21 DIAGNOSIS — N736 Female pelvic peritoneal adhesions (postinfective): Secondary | ICD-10-CM | POA: Diagnosis not present

## 2019-09-21 DIAGNOSIS — Z90710 Acquired absence of both cervix and uterus: Secondary | ICD-10-CM | POA: Diagnosis not present

## 2019-09-21 MED ORDER — HYDROCODONE-ACETAMINOPHEN 5-325 MG PO TABS: 1.0000 | ORAL_TABLET | Freq: Four times a day (QID) | ORAL | 0 refills | Status: DC | PRN

## 2019-09-25 MED ORDER — PROMETHAZINE HCL 25 MG PO TABS: 25.0000 mg | ORAL_TABLET | Freq: Four times a day (QID) | ORAL | 0 refills | Status: DC | PRN

## 2019-09-26 ENCOUNTER — Telehealth: Payer: Self-pay | Admitting: *Deleted

## 2019-09-26 ENCOUNTER — Telehealth: Payer: Self-pay | Admitting: Obstetrics and Gynecology

## 2019-09-26 NOTE — Telephone Encounter (Signed)
Telephoned patient at home number and advised patient that called pharmacy and Phenergan is now at pharmacy. Patient voiced understanding.

## 2019-09-26 NOTE — Telephone Encounter (Signed)
Patient called, stated that Dr. Glo Herring was going to send her something in yesterday and he didn't.  [facility]  [phone]

## 2019-09-27 ENCOUNTER — Ambulatory Visit (INDEPENDENT_AMBULATORY_CARE_PROVIDER_SITE_OTHER): Payer: 59 | Admitting: Obstetrics and Gynecology

## 2019-09-27 ENCOUNTER — Other Ambulatory Visit: Payer: Self-pay

## 2019-09-27 ENCOUNTER — Encounter: Payer: Self-pay | Admitting: Obstetrics and Gynecology

## 2019-09-27 VITALS — BP 119/72 | HR 89 | Ht 63.0 in | Wt 174.0 lb

## 2019-09-27 DIAGNOSIS — R102 Pelvic and perineal pain unspecified side: Secondary | ICD-10-CM | POA: Insufficient documentation

## 2019-09-27 DIAGNOSIS — Z90710 Acquired absence of both cervix and uterus: Secondary | ICD-10-CM

## 2019-09-27 MED ORDER — OXYCODONE-ACETAMINOPHEN 5-325 MG PO TABS: 1.0000 | ORAL_TABLET | Freq: Four times a day (QID) | ORAL | 0 refills | Status: AC | PRN

## 2019-09-27 NOTE — Progress Notes (Signed)
Patient ID: Dhiya Smits, female   DOB: 1975/08/27, 44 y.o.   MRN: [id number]    [facility] Visit  09/27/19          Patient name: Vanessa Bowen MRN [id number]  Date of birth: 1975/07/23  CC & HPI:  Vanessa Bowen is a 44 y.o. female presenting today for abdominal pain.  She reiterates that the has been having episodes of this pain since 03/12/2018. She notes that some days are better than others, but that they are still painful. The pain is always on the left side.   Per patient, she was experiencing severe enough pain to the point that she couldn't get out of bed, could barely walk, and was feeling very nauseated. She couldn't sleep and is taking her pain relief medications as directed.   She has a prescription for Phenergan for nausea. She has Vicodin for pain. She notes that on really bad days, she is still in significant pain and the medicine is not helping at all, even when doubling up.   ROS:  Review of Systems  Constitutional: Positive for malaise/fatigue. Negative for diaphoresis, fever and weight loss.  HENT: Negative for congestion and sore throat.   Eyes: Negative for blurred vision and double vision.  Respiratory: Negative for cough and shortness of breath.   Cardiovascular: Negative for chest pain, palpitations and leg swelling.  Gastrointestinal: Positive for abdominal pain, constipation (chronic) and nausea. Negative for diarrhea and vomiting.  Genitourinary: Negative for frequency and urgency.  Musculoskeletal: Negative for back pain, falls and myalgias.  Skin: Negative for rash.  Neurological: Positive for weakness. Negative for dizziness and headaches.  Psychiatric/Behavioral: Negative for depression. The patient is not nervous/anxious.      Pertinent History Reviewed:   Reviewed: Significant for  Medical         Past Medical History:  Diagnosis Date  . Anxiety   . Arthritis   . Chronic back pain   . Colitis, ulcerative ([place])   . Depression   . Elevated  blood pressure, situational   . Hypothyroidism   . Migraine                               Surgical Hx:    Past Surgical History:  Procedure Laterality Date  . ANKLE SURGERY    . BREAST ENHANCEMENT SURGERY    . CESAREAN SECTION    . KNEE SURGERY    . LAPAROSCOPIC BILATERAL SALPINGECTOMY  07/27/2012   Procedure: LAPAROSCOPIC BILATERAL SALPINGECTOMY;  Surgeon: Florian Buff, MD;  Location: [facility];  Service: Gynecology;  Laterality: N/A;  . OOPHORECTOMY Right 09/09/2016   Procedure: RIGHT OOPHORECTOMY;  Surgeon: Florian Buff, MD;  Location: [facility];  Service: Gynecology;  Laterality: Right;  . OVARIAN CYST REMOVAL    . VAGINAL HYSTERECTOMY N/A 09/09/2016   Procedure: HYSTERECTOMY VAGINAL;  Surgeon: Florian Buff, MD;  Location: [facility];  Service: Gynecology;  Laterality: N/A;   Medications: Reviewed & Updated - see associated section                       Current Outpatient Medications:  .  ALPRAZolam (XANAX) 1 MG tablet, Take 1 tablet (1 mg total) by mouth 3 (three) times daily as needed for anxiety., Disp: 90 tablet, Rfl: 5 .  conjugated estrogens (PREMARIN) vaginal cream, USE 1 GRAM NIGHTLY AS DIRECTED (Patient taking differently: Place 1 Applicatorful vaginally  2 (two) times a week. At bedtime`), Disp: 90 g, Rfl: 4 .  DIVIGEL 1 MG/GM GEL, PLACE 1 PACKET ONTO THE SKIN DAILY. (Patient taking differently: Apply 1 packet topically at bedtime. ), Disp: 1 g, Rfl: 3 .  DULoxetine (CYMBALTA) 60 MG capsule, Take 1 capsule (60 mg total) by mouth 2 (two) times daily., Disp: 180 capsule, Rfl: 1 .  HYDROcodone-acetaminophen (NORCO/VICODIN) 5-325 MG tablet, Take 1 tablet by mouth every 6 (six) hours as needed for severe pain. Adding one vicodin daily to current dosing , surgery planned., Disp: 30 tablet, Rfl: 0 .  ibuprofen (ADVIL,MOTRIN) 200 MG tablet, Take 400-600 mg by mouth every 8 (eight) hours as needed (for pain/headaches.)., Disp: , Rfl:  .  levothyroxine (SYNTHROID) 125 MCG tablet, Take 1 tablet  (125 mcg total) by mouth daily before breakfast., Disp: 90 tablet, Rfl: 1 .  promethazine (PHENERGAN) 25 MG tablet, Take 1 tablet (25 mg total) by mouth every 6 (six) hours as needed for nausea, vomiting or refractory nausea / vomiting (nausea)., Disp: 30 tablet, Rfl: 0 .  tiZANidine (ZANAFLEX) 4 MG tablet, Take 4 mg by mouth every 6 (six) hours as needed for muscle spasms. , Disp: , Rfl:  .  traZODone (DESYREL) 100 MG tablet, TAKE 2 TABLETS (200 MG TOTAL) BY MOUTH AT BEDTIME AS NEEDED FOR SLEEP. (Patient taking differently: Take 200 mg by mouth at bedtime. ), Disp: 180 tablet, Rfl: 0   Social History: Reviewed -  reports that she has been smoking cigarettes. She has a 7.50 pack-year smoking history. She has never used smokeless tobacco.  Objective Findings:  Vitals: Last menstrual period 07/24/2016.  PHYSICAL EXAMINATION General appearance - oriented to person, place, and time, normal appearing weight, well hydrated, anxious and in mild to moderate distress Mental status - alert, oriented to person, place, and time, anxious Chest - not examined Heart - not examined Abdomen - not examined Breasts - not examined Skin - normal coloration and turgor, no rashes, no suspicious skin lesions noted   Assessment & Plan:   A:  1.  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  P:  1. Magnesium Citrate prior to surgery to clear bowels 2. Laparoscopic oophorectomy on 10/12/2019  3. Rx Percocet     By signing my name below, I, [employer], attest that this documentation has been prepared under the direction and in the presence of Jonnie Kind, MD. Electronically Signed: [place]. 09/27/19. 8:32 AM.  I personally performed the services described in this documentation, which was SCRIBED in my presence. The recorded information has been reviewed and considered accurate. It has been edited as necessary during review. Jonnie Kind, MD

## 2019-09-28 ENCOUNTER — Ambulatory Visit (INDEPENDENT_AMBULATORY_CARE_PROVIDER_SITE_OTHER): Payer: 59 | Admitting: Orthopaedic Surgery

## 2019-09-28 ENCOUNTER — Encounter: Payer: Self-pay | Admitting: Orthopaedic Surgery

## 2019-09-28 DIAGNOSIS — F17210 Nicotine dependence, cigarettes, uncomplicated: Secondary | ICD-10-CM

## 2019-09-28 DIAGNOSIS — M545 Low back pain, unspecified: Secondary | ICD-10-CM

## 2019-09-28 DIAGNOSIS — G894 Chronic pain syndrome: Secondary | ICD-10-CM | POA: Diagnosis not present

## 2019-09-28 DIAGNOSIS — G8929 Other chronic pain: Secondary | ICD-10-CM

## 2019-09-28 MED ORDER — HYDROCODONE-ACETAMINOPHEN 5-325 MG PO TABS: ORAL_TABLET | ORAL | 0 refills | Status: DC

## 2019-09-28 NOTE — Progress Notes (Signed)
Virtual Visit via Telephone Note  I connected with@ on 09/28/19 at 10:30 AM EDT by telephone and verified that I am speaking with the correct person using two identifiers.  Location: Patient: home Provider: [place]   I discussed the limitations, risks, security and privacy concerns of performing an evaluation and management service by telephone and the availability of in person appointments. I also discussed with the patient that there may be a patient responsible charge related to this service. The patient expressed understanding and agreed to proceed.   History of Present Illness: She has increased lower back pain and preferred to do telehealth visit.  She has surgery planned with Dr.Ferguson later in the month. Her back pain is intense at times.  She has no numbness. She has no weakness. She is taking her medicine and Dr. Glo Herring has also given some pain medicine.  I am aware of this.  She has no new trauma.   Observations/Objective: Per above.  Assessment and Plan: Encounter Diagnoses  Name Primary?  . Chronic midline low back pain without sciatica Yes  . Chronic pain syndrome   . Cigarette nicotine dependence without complication      Follow Up Instructions: I will give pain medicine today.  I will have Dr. Glo Herring do the pain medicine until he discharges her after the recovery from her surgery.  I have reviewed the [place] web site prior to prescribing narcotic medicine for this patient.      I discussed the assessment and treatment plan with the patient. The patient was provided an opportunity to ask questions and all were answered. The patient agreed with the plan and demonstrated an understanding of the instructions.   The patient was advised to call back or seek an in-person evaluation if the symptoms worsen or if the condition fails to improve as anticipated.  I provided 8 minutes of non-face-to-face time during  this encounter.   Sanjuana Kava, MD

## 2019-10-09 ENCOUNTER — Encounter (HOSPITAL_COMMUNITY)
Admission: RE | Admit: 2019-10-09 | Discharge: 2019-10-09 | Disposition: A | Discharge status: Home / Self Care | Payer: 59 | Source: Ambulatory Visit | Attending: Obstetrics and Gynecology | Admitting: Obstetrics and Gynecology

## 2019-10-09 ENCOUNTER — Other Ambulatory Visit (HOSPITAL_COMMUNITY)
Admission: RE | Admit: 2019-10-09 | Discharge: 2019-10-09 | Disposition: A | Discharge status: Home / Self Care | Payer: 59 | Source: Ambulatory Visit | Attending: Obstetrics and Gynecology | Admitting: Obstetrics and Gynecology

## 2019-10-09 ENCOUNTER — Other Ambulatory Visit: Payer: Self-pay | Admitting: Obstetrics and Gynecology

## 2019-10-09 ENCOUNTER — Encounter (HOSPITAL_COMMUNITY): Payer: Self-pay

## 2019-10-09 ENCOUNTER — Other Ambulatory Visit: Payer: Self-pay

## 2019-10-09 DIAGNOSIS — Z01812 Encounter for preprocedural laboratory examination: Secondary | ICD-10-CM | POA: Diagnosis not present

## 2019-10-09 DIAGNOSIS — Z20822 Contact with and (suspected) exposure to covid-19: Secondary | ICD-10-CM | POA: Diagnosis not present

## 2019-10-09 LAB — CBC
HCT: 45.7 % (ref 36.0–46.0)
Hemoglobin: 15.2 g/dL — ABNORMAL HIGH (ref 12.0–15.0)
MCH: 30.3 pg (ref 26.0–34.0)
MCHC: 33.3 g/dL (ref 30.0–36.0)
MCV: 91.2 fL (ref 80.0–100.0)
Platelets: 402 10*3/uL — ABNORMAL HIGH (ref 150–400)
RBC: 5.01 MIL/uL (ref 3.87–5.11)
RDW: 12.5 % (ref 11.5–15.5)
WBC: 10.8 10*3/uL — ABNORMAL HIGH (ref 4.0–10.5)
nRBC: 0 % (ref 0.0–0.2)

## 2019-10-09 LAB — COMPREHENSIVE METABOLIC PANEL
ALT: 14 U/L (ref 0–44)
AST: 14 U/L — ABNORMAL LOW (ref 15–41)
Albumin: 4.5 g/dL (ref 3.5–5.0)
Alkaline Phosphatase: 57 U/L (ref 38–126)
Anion gap: 10 (ref 5–15)
BUN: 15 mg/dL (ref 6–20)
CO2: 25 mmol/L (ref 22–32)
Calcium: 9.6 mg/dL (ref 8.9–10.3)
Chloride: 101 mmol/L (ref 98–111)
Creatinine, Ser: 0.81 mg/dL (ref 0.44–1.00)
GFR calc Af Amer: >60 mL/min (ref >60)
GFR calc non Af Amer: >60 mL/min (ref >60)
Glucose, Bld: 123 mg/dL — ABNORMAL HIGH (ref 70–99)
Potassium: 4 mmol/L (ref 3.5–5.1)
Sodium: 136 mmol/L (ref 135–145)
Total Bilirubin: 0.6 mg/dL (ref 0.3–1.2)
Total Protein: 7.6 g/dL (ref 6.5–8.1)

## 2019-10-09 LAB — URINALYSIS, ROUTINE W REFLEX MICROSCOPIC
Bilirubin Urine: NEGATIVE
Glucose, UA: NEGATIVE mg/dL
Hgb urine dipstick: NEGATIVE
Ketones, ur: NEGATIVE mg/dL
Leukocytes,Ua: NEGATIVE
Nitrite: NEGATIVE
Protein, ur: NEGATIVE mg/dL
Specific Gravity, Urine: 1.017 (ref 1.005–1.030)
pH: 5 (ref 5.0–8.0)

## 2019-10-09 LAB — TYPE AND SCREEN
ABO/RH(D): O POS
Antibody Screen: NEGATIVE

## 2019-10-09 NOTE — Patient Instructions (Signed)
Vanessa Bowen  10/09/2019     @PREFPERIOPPHARMACY @   Your procedure is scheduled on  10/12/2019   Report to [facility] at  [place].  Call this number if you have problems the morning of surgery:  [phone]   Remember:  Do not eat or drink after midnight.                        Take these medicines the morning of surgery with A SIP OF WATER  Xanax(if needed), cymbalta, hydrocodone(if needed), levothyroxine, phenergan(if needed), zanaflex(if needed).    Do not wear jewelry, make-up or nail polish.  Do not wear lotions, powders, or perfumes. Please wear deodorant and brush your teeth.  Do not shave 48 hours prior to surgery.  Men may shave face and neck.  Do not bring valuables to the hospital.  [facility] is not responsible for any belongings or valuables.  Contacts, dentures or bridgework may not be worn into surgery.  Leave your suitcase in the car.  After surgery it may be brought to your room.  For patients admitted to the hospital, discharge time will be determined by your treatment team.  Patients discharged the day of surgery will not be allowed to drive home.   Name and phone number of your driver:   family Special instructions:  DO NOT smoke the day of your procedure.  Please read over the following fact sheets that you were given. Anesthesia Post-op Instructions and Care and Recovery After Surgery       Unilateral Salpingo-Oophorectomy, Care After This sheet gives you information about how to care for yourself after your procedure. Your health care provider may also give you more specific instructions. If you have problems or questions, contact your health care provider. What can I expect after the procedure? After the procedure, it is common to have:  Abdominal pain.  Some occasional vaginal bleeding (spotting).  Tiredness. Follow these instructions at home: Incision care   Keep your incision area and your bandage (dressing) clean and  dry.  Follow instructions from your health care provider about how to take care of your incision. Make sure you: ? Wash your hands with soap and water before you change your dressing. If soap and water are not available, use hand sanitizer. ? Change your dressing as told by your health care provider. ? Leave stitches (sutures), staples, skin glue, or adhesive strips in place. These skin closures may need to stay in place for 2 weeks or longer. If adhesive strip edges start to loosen and curl up, you may trim the loose edges. Do not remove adhesive strips completely unless your health care provider tells you to do that.  Check your incision area every day for signs of infection. Check for: ? Redness, swelling, or pain. ? Fluid or blood. ? Warmth. ? Pus or a bad smell. Activity  Do not drive or use heavy machinery while taking prescription pain medicine.  Do not drive for 24 hours if you received a medicine to help you relax (sedative).  Take frequent, short walks throughout the day. Rest when you get tired. Ask your health care provider what activities are safe for you.  Avoid activities that require great effort. Also, avoid heavy lifting. Do not lift anything that is heavier than 5 lb (2.3 kg), or the limit that your health care provider tells you, until he or she says that  it is safe to do so.  Do not douche, use tampons, or have sex until your health care provider approves. General instructions  To prevent or treat constipation while you are taking prescription pain medicine, your health care provider may recommend that you: ? Drink enough fluid to keep your urine pale yellow. ? Take over-the-counter or prescription medicines. ? Eat foods that are high in fiber, such as fresh fruits and vegetables, whole grains, and beans. ? Limit foods that are high in fat and processed sugars, such as fried and sweet foods.  Take over-the-counter and prescription medicines only as told by your  health care provider.  Do not take baths, swim, or use a hot tub until your health care provider approves. Ask your health care provider if you may take showers. You may only be allowed to take sponge baths.  Wear compression stockings as told by your health care provider. These stockings help to prevent blood clots and reduce swelling in your legs.  Keep all follow-up visits as told by your health care provider. This is important. Contact a health care provider if:  You have pain when you urinate.  You have pus or a bad smelling discharge coming from your vagina.  You have redness, swelling, or pain around your incision.  You have fluid or blood coming from your incision.  Your incision feels warm to the touch.  You have pus or a bad smell coming from your incision.  You have a fever.  Your incision starts to break open.  You have abdominal pain that gets worse or does not get better with medicine.  You develop a rash.  You develop nausea and vomiting.  You feel lightheaded. Get help right away if:  You develop pain in your chest or leg.  You develop shortness of breath.  You faint.  You have increased bleeding from your vagina. Summary  After the procedure, it is common to have pain, tiredness, and occasional bleeding from the vagina.  Follow instructions from your health care provider about how to take care of your incision.  Check your incision every day for signs of infection and report any symptoms to your health care provider.  Follow instructions from your health care provider about activities and restrictions. This information is not intended to replace advice given to you by your health care provider. Make sure you discuss any questions you have with your health care provider. Document Revised: 05/21/2017 Document Reviewed: 09/17/2016 Elsevier Patient Education  [place] Anesthesia, Adult, Care After This sheet gives you information  about how to care for yourself after your procedure. Your health care provider may also give you more specific instructions. If you have problems or questions, contact your health care provider. What can I expect after the procedure? After the procedure, the following side effects are common:  Pain or discomfort at the IV site.  Nausea.  Vomiting.  Sore throat.  Trouble concentrating.  Feeling cold or chills.  Weak or tired.  Sleepiness and fatigue.  Soreness and body aches. These side effects can affect parts of the body that were not involved in surgery. Follow these instructions at home:  For at least 24 hours after the procedure:  Have a responsible adult stay with you. It is important to have someone help care for you until you are awake and alert.  Rest as needed.  Do not: ? Participate in activities in which you could fall or become injured. ? Drive. ?  Use heavy machinery. ? Drink alcohol. ? Take sleeping pills or medicines that cause drowsiness. ? Make important decisions or sign legal documents. ? Take care of children on your own. Eating and drinking  Follow any instructions from your health care provider about eating or drinking restrictions.  When you feel hungry, start by eating small amounts of foods that are soft and easy to digest (bland), such as toast. Gradually return to your regular diet.  Drink enough fluid to keep your urine pale yellow.  If you vomit, rehydrate by drinking water, juice, or clear broth. General instructions  If you have sleep apnea, surgery and certain medicines can increase your risk for breathing problems. Follow instructions from your health care provider about wearing your sleep device: ? Anytime you are sleeping, including during daytime naps. ? While taking prescription pain medicines, sleeping medicines, or medicines that make you drowsy.  Return to your normal activities as told by your health care provider. Ask your  health care provider what activities are safe for you.  Take over-the-counter and prescription medicines only as told by your health care provider.  If you smoke, do not smoke without supervision.  Keep all follow-up visits as told by your health care provider. This is important. Contact a health care provider if:  You have nausea or vomiting that does not get better with medicine.  You cannot eat or drink without vomiting.  You have pain that does not get better with medicine.  You are unable to pass urine.  You develop a skin rash.  You have a fever.  You have redness around your IV site that gets worse. Get help right away if:  You have difficulty breathing.  You have chest pain.  You have blood in your urine or stool, or you vomit blood. Summary  After the procedure, it is common to have a sore throat or nausea. It is also common to feel tired.  Have a responsible adult stay with you for the first 24 hours after general anesthesia. It is important to have someone help care for you until you are awake and alert.  When you feel hungry, start by eating small amounts of foods that are soft and easy to digest (bland), such as toast. Gradually return to your regular diet.  Drink enough fluid to keep your urine pale yellow.  Return to your normal activities as told by your health care provider. Ask your health care provider what activities are safe for you. This information is not intended to replace advice given to you by your health care provider. Make sure you discuss any questions you have with your health care provider. Document Revised: 06/11/2017 Document Reviewed: 01/22/2017 Elsevier Patient Education  [place]. How to Use Chlorhexidine for Bathing Chlorhexidine gluconate (CHG) is a germ-killing (antiseptic) solution that is used to clean the skin. It can get rid of the bacteria that normally live on the skin and can keep them away for about 24 hours. To  clean your skin with CHG, you may be given:  A CHG solution to use in the shower or as part of a sponge bath.  A prepackaged cloth that contains CHG. Cleaning your skin with CHG may help lower the risk for infection:  While you are staying in the intensive care unit of the hospital.  If you have a vascular access, such as a central line, to provide short-term or long-term access to your veins.  If you have a catheter to  drain urine from your bladder.  If you are on a ventilator. A ventilator is a machine that helps you breathe by moving air in and out of your lungs.  After surgery. What are the risks? Risks of using CHG include:  A skin reaction.  Hearing loss, if CHG gets in your ears.  Eye injury, if CHG gets in your eyes and is not rinsed out.  The CHG product catching fire. Make sure that you avoid smoking and flames after applying CHG to your skin. Do not use CHG:  If you have a chlorhexidine allergy or have previously reacted to chlorhexidine.  On babies younger than 4 months of age. How to use CHG solution  Use CHG only as told by your health care provider, and follow the instructions on the label.  Use the full amount of CHG as directed. Usually, this is one bottle. During a shower Follow these steps when using CHG solution during a shower (unless your health care provider gives you different instructions): 1. Start the shower. 2. Use your normal soap and shampoo to wash your face and hair. 3. Turn off the shower or move out of the shower stream. 4. Pour the CHG onto a clean washcloth. Do not use any type of brush or rough-edged sponge. 5. Starting at your neck, lather your body down to your toes. Make sure you follow these instructions: ? If you will be having surgery, pay special attention to the part of your body where you will be having surgery. Scrub this area for at least 1 minute. ? Do not use CHG on your head or face. If the solution gets into your ears or  eyes, rinse them well with water. ? Avoid your genital area. ? Avoid any areas of skin that have broken skin, cuts, or scrapes. ? Scrub your back and under your arms. Make sure to wash skin folds. 6. Let the lather sit on your skin for 1-2 minutes or as long as told by your health care provider. 7. Thoroughly rinse your entire body in the shower. Make sure that all body creases and crevices are rinsed well. 8. Dry off with a clean towel. Do not put any substances on your body afterward--such as powder, lotion, or perfume--unless you are told to do so by your health care provider. Only use lotions that are recommended by the manufacturer. 9. Put on clean clothes or pajamas. 10. If it is the night before your surgery, sleep in clean sheets.  During a sponge bath Follow these steps when using CHG solution during a sponge bath (unless your health care provider gives you different instructions): 1. Use your normal soap and shampoo to wash your face and hair. 2. Pour the CHG onto a clean washcloth. 3. Starting at your neck, lather your body down to your toes. Make sure you follow these instructions: ? If you will be having surgery, pay special attention to the part of your body where you will be having surgery. Scrub this area for at least 1 minute. ? Do not use CHG on your head or face. If the solution gets into your ears or eyes, rinse them well with water. ? Avoid your genital area. ? Avoid any areas of skin that have broken skin, cuts, or scrapes. ? Scrub your back and under your arms. Make sure to wash skin folds. 4. Let the lather sit on your skin for 1-2 minutes or as long as told by your health care provider. 5.  Using a different clean, wet washcloth, thoroughly rinse your entire body. Make sure that all body creases and crevices are rinsed well. 6. Dry off with a clean towel. Do not put any substances on your body afterward--such as powder, lotion, or perfume--unless you are told to do so by  your health care provider. Only use lotions that are recommended by the manufacturer. 7. Put on clean clothes or pajamas. 8. If it is the night before your surgery, sleep in clean sheets. How to use CHG prepackaged cloths  Only use CHG cloths as told by your health care provider, and follow the instructions on the label.  Use the CHG cloth on clean, dry skin.  Do not use the CHG cloth on your head or face unless your health care provider tells you to.  When washing with the CHG cloth: ? Avoid your genital area. ? Avoid any areas of skin that have broken skin, cuts, or scrapes. Before surgery Follow these steps when using a CHG cloth to clean before surgery (unless your health care provider gives you different instructions): 1. Using the CHG cloth, vigorously scrub the part of your body where you will be having surgery. Scrub using a back-and-forth motion for 3 minutes. The area on your body should be completely wet with CHG when you are done scrubbing. 2. Do not rinse. Discard the cloth and let the area air-dry. Do not put any substances on the area afterward, such as powder, lotion, or perfume. 3. Put on clean clothes or pajamas. 4. If it is the night before your surgery, sleep in clean sheets.  For general bathing Follow these steps when using CHG cloths for general bathing (unless your health care provider gives you different instructions). 1. Use a separate CHG cloth for each area of your body. Make sure you wash between any folds of skin and between your fingers and toes. Wash your body in the following order, switching to a new cloth after each step: ? The front of your neck, shoulders, and chest. ? Both of your arms, under your arms, and your hands. ? Your stomach and groin area, avoiding the genitals. ? Your right leg and foot. ? Your left leg and foot. ? The back of your neck, your back, and your buttocks. 2. Do not rinse. Discard the cloth and let the area air-dry. Do not put  any substances on your body afterward--such as powder, lotion, or perfume--unless you are told to do so by your health care provider. Only use lotions that are recommended by the manufacturer. 3. Put on clean clothes or pajamas. Contact a health care provider if:  Your skin gets irritated after scrubbing.  You have questions about using your solution or cloth. Get help right away if:  Your eyes become very red or swollen.  Your eyes itch badly.  Your skin itches badly and is red or swollen.  Your hearing changes.  You have trouble seeing.  You have swelling or tingling in your mouth or throat.  You have trouble breathing.  You swallow any chlorhexidine. Summary  Chlorhexidine gluconate (CHG) is a germ-killing (antiseptic) solution that is used to clean the skin. Cleaning your skin with CHG may help to lower your risk for infection.  You may be given CHG to use for bathing. It may be in a bottle or in a prepackaged cloth to use on your skin. Carefully follow your health care provider's instructions and the instructions on the product label.  Do not  use CHG if you have a chlorhexidine allergy.  Contact your health care provider if your skin gets irritated after scrubbing. This information is not intended to replace advice given to you by your health care provider. Make sure you discuss any questions you have with your health care provider. Document Revised: 08/25/2018 Document Reviewed: 05/06/2017 Elsevier Patient Education  [place].

## 2019-10-10 LAB — SARS CORONAVIRUS 2 (TAT 6-24 HRS): SARS Coronavirus 2: NEGATIVE

## 2019-10-12 ENCOUNTER — Other Ambulatory Visit: Payer: Self-pay | Admitting: Obstetrics and Gynecology

## 2019-10-12 ENCOUNTER — Encounter (HOSPITAL_COMMUNITY)
Admission: RE | Disposition: A | Discharge status: Home / Self Care | Payer: Self-pay | Source: Home / Self Care | Attending: Obstetrics and Gynecology

## 2019-10-12 ENCOUNTER — Observation Stay (HOSPITAL_COMMUNITY): Payer: 59 | Admitting: Anesthesiology

## 2019-10-12 ENCOUNTER — Observation Stay (HOSPITAL_COMMUNITY)
Admission: RE | Admit: 2019-10-12 | Discharge: 2019-10-12 | Disposition: A | Discharge status: Home / Self Care | Payer: 59 | Attending: Obstetrics and Gynecology | Admitting: Obstetrics and Gynecology

## 2019-10-12 ENCOUNTER — Encounter (HOSPITAL_COMMUNITY): Payer: Self-pay | Admitting: Obstetrics and Gynecology

## 2019-10-12 DIAGNOSIS — N736 Female pelvic peritoneal adhesions (postinfective): Secondary | ICD-10-CM | POA: Diagnosis not present

## 2019-10-12 DIAGNOSIS — F419 Anxiety disorder, unspecified: Secondary | ICD-10-CM | POA: Diagnosis not present

## 2019-10-12 DIAGNOSIS — Z79899 Other long term (current) drug therapy: Secondary | ICD-10-CM | POA: Insufficient documentation

## 2019-10-12 DIAGNOSIS — F329 Major depressive disorder, single episode, unspecified: Secondary | ICD-10-CM | POA: Insufficient documentation

## 2019-10-12 DIAGNOSIS — R102 Pelvic and perineal pain: Secondary | ICD-10-CM | POA: Diagnosis present

## 2019-10-12 DIAGNOSIS — F17210 Nicotine dependence, cigarettes, uncomplicated: Secondary | ICD-10-CM | POA: Insufficient documentation

## 2019-10-12 DIAGNOSIS — N839 Noninflammatory disorder of ovary, fallopian tube and broad ligament, unspecified: Secondary | ICD-10-CM | POA: Insufficient documentation

## 2019-10-12 DIAGNOSIS — G8918 Other acute postprocedural pain: Secondary | ICD-10-CM | POA: Insufficient documentation

## 2019-10-12 DIAGNOSIS — M549 Dorsalgia, unspecified: Secondary | ICD-10-CM | POA: Insufficient documentation

## 2019-10-12 DIAGNOSIS — Z79890 Hormone replacement therapy (postmenopausal): Secondary | ICD-10-CM | POA: Diagnosis not present

## 2019-10-12 DIAGNOSIS — G8929 Other chronic pain: Secondary | ICD-10-CM | POA: Diagnosis not present

## 2019-10-12 DIAGNOSIS — E039 Hypothyroidism, unspecified: Secondary | ICD-10-CM | POA: Insufficient documentation

## 2019-10-12 DIAGNOSIS — N83202 Unspecified ovarian cyst, left side: Secondary | ICD-10-CM | POA: Diagnosis not present

## 2019-10-12 SURGERY — OOPHORECTOMY, LAPAROSCOPIC
Anesthesia: General | Site: Abdomen | Laterality: Left

## 2019-10-12 MED ORDER — SODIUM CHLORIDE (PF) 0.9 % IJ SOLN
INTRAMUSCULAR | Status: AC
  Filled 2019-10-12: qty 10

## 2019-10-12 MED ORDER — ONDANSETRON HCL 4 MG/2ML IJ SOLN
INTRAMUSCULAR | Status: AC
  Filled 2019-10-12: qty 2

## 2019-10-12 MED ORDER — CEFAZOLIN SODIUM-DEXTROSE 2-4 GM/100ML-% IV SOLN
2.0000 g | INTRAVENOUS | Status: AC
  Administered 2019-10-12: 2 g via INTRAVENOUS
  Filled 2019-10-12: qty 100

## 2019-10-12 MED ORDER — DEXAMETHASONE SODIUM PHOSPHATE 10 MG/ML IJ SOLN
INTRAMUSCULAR | Status: AC
  Filled 2019-10-12: qty 1

## 2019-10-12 MED ORDER — SUFENTANIL CITRATE 50 MCG/ML IV SOLN
INTRAVENOUS | Status: DC | PRN
  Administered 2019-10-12: 10 ug via INTRAVENOUS
  Administered 2019-10-12: 5 ug via INTRAVENOUS
  Administered 2019-10-12: 10 ug via INTRAVENOUS
  Administered 2019-10-12: 5 ug via INTRAVENOUS

## 2019-10-12 MED ORDER — LACTATED RINGERS IV SOLN: INTRAVENOUS | Status: DC

## 2019-10-12 MED ORDER — DEXAMETHASONE SODIUM PHOSPHATE 10 MG/ML IJ SOLN
INTRAMUSCULAR | Status: DC | PRN
  Administered 2019-10-12: 10 mg via INTRAVENOUS

## 2019-10-12 MED ORDER — ROCURONIUM BROMIDE 10 MG/ML (PF) SYRINGE
PREFILLED_SYRINGE | INTRAVENOUS | Status: AC
  Filled 2019-10-12: qty 10

## 2019-10-12 MED ORDER — BUPIVACAINE HCL (PF) 0.5 % IJ SOLN
INTRAMUSCULAR | Status: AC
  Filled 2019-10-12: qty 30

## 2019-10-12 MED ORDER — PROPOFOL 10 MG/ML IV BOLUS
INTRAVENOUS | Status: AC
  Filled 2019-10-12: qty 60

## 2019-10-12 MED ORDER — BUPIVACAINE HCL (PF) 0.5 % IJ SOLN
INTRAMUSCULAR | Status: DC | PRN
  Administered 2019-10-12: 30 mL

## 2019-10-12 MED ORDER — ONDANSETRON HCL 4 MG/2ML IJ SOLN
INTRAMUSCULAR | Status: DC | PRN
  Administered 2019-10-12: 4 mg via INTRAVENOUS

## 2019-10-12 MED ORDER — GLYCOPYRROLATE 0.2 MG/ML IJ SOLN
INTRAMUSCULAR | Status: DC | PRN
  Administered 2019-10-12: .2 mg via INTRAVENOUS

## 2019-10-12 MED ORDER — SUGAMMADEX SODIUM 500 MG/5ML IV SOLN
INTRAVENOUS | Status: DC | PRN
  Administered 2019-10-12: 200 mg via INTRAVENOUS

## 2019-10-12 MED ORDER — HYDROCODONE-ACETAMINOPHEN 5-325 MG PO TABS: ORAL_TABLET | ORAL | 0 refills | Status: DC

## 2019-10-12 MED ORDER — LACTATED RINGERS IV SOLN: INTRAVENOUS | Status: DC | PRN

## 2019-10-12 MED ORDER — GLYCOPYRROLATE PF 0.2 MG/ML IJ SOSY
PREFILLED_SYRINGE | INTRAMUSCULAR | Status: AC
  Filled 2019-10-12: qty 1

## 2019-10-12 MED ORDER — ONDANSETRON HCL 4 MG/2ML IJ SOLN: 4.0000 mg | Freq: Once | INTRAMUSCULAR | Status: DC | PRN

## 2019-10-12 MED ORDER — TRAMADOL HCL 50 MG PO TABS: 50.0000 mg | ORAL_TABLET | Freq: Four times a day (QID) | ORAL | 0 refills | Status: DC | PRN

## 2019-10-12 MED ORDER — PROPOFOL 10 MG/ML IV BOLUS
INTRAVENOUS | Status: DC | PRN
  Administered 2019-10-12: 150 mg via INTRAVENOUS

## 2019-10-12 MED ORDER — SUCCINYLCHOLINE CHLORIDE 20 MG/ML IJ SOLN
INTRAMUSCULAR | Status: DC | PRN
  Administered 2019-10-12: 120 mg via INTRAVENOUS

## 2019-10-12 MED ORDER — LIDOCAINE 2% (20 MG/ML) 5 ML SYRINGE
INTRAMUSCULAR | Status: AC
  Filled 2019-10-12: qty 5

## 2019-10-12 MED ORDER — ROCURONIUM BROMIDE 100 MG/10ML IV SOLN
INTRAVENOUS | Status: DC | PRN
  Administered 2019-10-12: 30 mg via INTRAVENOUS

## 2019-10-12 MED ORDER — FENTANYL CITRATE (PF) 100 MCG/2ML IJ SOLN
25.0000 ug | INTRAMUSCULAR | Status: DC | PRN
  Administered 2019-10-12: 50 ug via INTRAVENOUS
  Filled 2019-10-12: qty 2

## 2019-10-12 MED ORDER — 0.9 % SODIUM CHLORIDE (POUR BTL) OPTIME
TOPICAL | Status: DC | PRN
  Administered 2019-10-12: 1000 mL

## 2019-10-12 MED ORDER — SUCCINYLCHOLINE CHLORIDE 200 MG/10ML IV SOSY
PREFILLED_SYRINGE | INTRAVENOUS | Status: AC
  Filled 2019-10-12: qty 10

## 2019-10-12 MED ORDER — SUFENTANIL CITRATE 50 MCG/ML IV SOLN
INTRAVENOUS | Status: AC
  Filled 2019-10-12: qty 1

## 2019-10-12 SURGICAL SUPPLY — 48 items
BANDAGE STRIP 1X3 FLEXIBLE (GAUZE/BANDAGES/DRESSINGS) ×6 IMPLANT
BLADE SURG SZ11 CARB STEEL (BLADE) ×2 IMPLANT
CLOTH BEACON ORANGE TIMEOUT ST (SAFETY) ×2 IMPLANT
COVER LIGHT HANDLE STERIS (MISCELLANEOUS) ×4 IMPLANT
COVER WAND RF STERILE (DRAPES) ×2 IMPLANT
DECANTER SPIKE VIAL GLASS SM (MISCELLANEOUS) ×2 IMPLANT
DERMABOND ADVANCED (GAUZE/BANDAGES/DRESSINGS) ×1
DERMABOND ADVANCED .7 DNX12 (GAUZE/BANDAGES/DRESSINGS) ×1 IMPLANT
DURAPREP 26ML APPLICATOR (WOUND CARE) ×2 IMPLANT
ELECT REM PT RETURN 9FT ADLT (ELECTROSURGICAL) ×2
ELECTRODE REM PT RTRN 9FT ADLT (ELECTROSURGICAL) ×1 IMPLANT
FILTER SMOKE EVAC LAPAROSHD (FILTER) ×2 IMPLANT
GLOVE BIOGEL PI IND STRL 7.0 (GLOVE) ×2 IMPLANT
GLOVE BIOGEL PI IND STRL 9 (GLOVE) ×1 IMPLANT
GLOVE BIOGEL PI INDICATOR 7.0 (GLOVE) ×2
GLOVE BIOGEL PI INDICATOR 9 (GLOVE) ×1
GLOVE ECLIPSE 6.5 STRL STRAW (GLOVE) ×2 IMPLANT
GLOVE ECLIPSE 9.0 STRL (GLOVE) ×4 IMPLANT
GOWN SPEC L3 XXLG W/TWL (GOWN DISPOSABLE) ×2 IMPLANT
GOWN STRL REUS W/TWL LRG LVL3 (GOWN DISPOSABLE) ×2 IMPLANT
INST SET LAPROSCOPIC GYN AP (KITS) ×2 IMPLANT
KIT TURNOVER CYSTO (KITS) ×2 IMPLANT
MANIFOLD NEPTUNE II (INSTRUMENTS) ×2 IMPLANT
NEEDLE HYPO 25X1 1.5 SAFETY (NEEDLE) ×2 IMPLANT
NEEDLE INSUFFLATION 14GA 120MM (NEEDLE) ×2 IMPLANT
NS IRRIG 1000ML POUR BTL (IV SOLUTION) ×2 IMPLANT
PACK PERI GYN (CUSTOM PROCEDURE TRAY) ×2 IMPLANT
PAD ARMBOARD 7.5X6 YLW CONV (MISCELLANEOUS) ×2 IMPLANT
SET BASIN LINEN APH (SET/KITS/TRAYS/PACK) ×2 IMPLANT
SET TUBE IRRIG SUCTION NO TIP (IRRIGATION / IRRIGATOR) ×2 IMPLANT
SET TUBE SMOKE EVAC HIGH FLOW (TUBING) ×2 IMPLANT
SHEARS HARMONIC ACE PLUS 36CM (ENDOMECHANICALS) ×2 IMPLANT
SLEEVE ENDOPATH XCEL 5M (ENDOMECHANICALS) ×2 IMPLANT
SOL ANTI FOG 6CC (MISCELLANEOUS) ×1 IMPLANT
SOLUTION ANTI FOG 6CC (MISCELLANEOUS) ×1
STRIP CLOSURE SKIN 1/4X3 (GAUZE/BANDAGES/DRESSINGS) ×2 IMPLANT
SUT VIC AB 4-0 PS2 27 (SUTURE) ×2 IMPLANT
SUT VICRYL 0 UR6 27IN ABS (SUTURE) ×2 IMPLANT
SYR 10ML LL (SYRINGE) ×4 IMPLANT
SYR BULB IRRIGATION 50ML (SYRINGE) ×2 IMPLANT
SYR CONTROL 10ML LL (SYRINGE) ×2 IMPLANT
SYS BAG RETRIEVAL 10MM (BASKET) ×2
SYSTEM BAG RETRIEVAL 10MM (BASKET) ×1 IMPLANT
TROCAR ENDO BLADELESS 11MM (ENDOMECHANICALS) ×2 IMPLANT
TROCAR XCEL NON-BLD 5MMX100MML (ENDOMECHANICALS) ×2 IMPLANT
TROCAR Z-THAD FIOS HNDL 12X100 (TROCAR) IMPLANT
TUBING DYE INJECTION 900-617 (MISCELLANEOUS) IMPLANT
WARMER LAPAROSCOPE (MISCELLANEOUS) ×2 IMPLANT

## 2019-10-12 NOTE — Anesthesia Preprocedure Evaluation (Signed)
Anesthesia Evaluation  Patient identified by MRN, date of birth, ID band Patient awake    Reviewed: Allergy & Precautions, H&P , NPO status , Patient's Chart, lab work & pertinent test results, reviewed documented beta blocker date and time   Airway Mallampati: II  TM Distance: >3 FB Neck ROM: full    Dental no notable dental hx. (+) Teeth Intact   Pulmonary neg pulmonary ROS, Current Smoker and Patient abstained from smoking.,    Pulmonary exam normal breath sounds clear to auscultation       Cardiovascular Exercise Tolerance: Good negative cardio ROS   Rhythm:regular Rate:Normal     Neuro/Psych  Headaches, PSYCHIATRIC DISORDERS Anxiety Depression    GI/Hepatic Neg liver ROS, PUD,   Endo/Other  Hypothyroidism   Renal/GU negative Renal ROS  negative genitourinary   Musculoskeletal   Abdominal   Peds  Hematology negative hematology ROS (+)   Anesthesia Other Findings   Reproductive/Obstetrics negative OB ROS                             Anesthesia Physical Anesthesia Plan  ASA: II  Anesthesia Plan: General   Post-op Pain Management:    Induction:   PONV Risk Score and Plan: 3 and Ondansetron  Airway Management Planned:   Additional Equipment:   Intra-op Plan:   Post-operative Plan:   Informed Consent: I have reviewed the patients History and Physical, chart, labs and discussed the procedure including the risks, benefits and alternatives for the proposed anesthesia with the patient or authorized representative who has indicated his/her understanding and acceptance.     Dental Advisory Given  Plan Discussed with: CRNA  Anesthesia Plan Comments:         Anesthesia Quick Evaluation

## 2019-10-12 NOTE — Anesthesia Postprocedure Evaluation (Signed)
Anesthesia Post Note  Patient: Vanessa Bowen  Procedure(s) Performed: LAPAROSCOPIC OOPHORECTOMY (Left Abdomen)  Patient location during evaluation: PACU Anesthesia Type: General Level of consciousness: awake and alert and patient cooperative Pain management: satisfactory to patient Vital Signs Assessment: post-procedure vital signs reviewed and stable Respiratory status: spontaneous breathing Cardiovascular status: stable Postop Assessment: no apparent nausea or vomiting Anesthetic complications: no     Last Vitals:  Vitals:   10/12/19 0915 10/12/19 0930  BP: 105/74 104/64  Pulse: 78 72  Resp: 17 13  Temp: 36.8 C   SpO2: 97% 96%    Last Pain:  Vitals:   10/12/19 0930  TempSrc:   PainSc: 4                  Maripaz Mullan

## 2019-10-12 NOTE — Progress Notes (Signed)
Pain meds switched to Tramadol 50-100 mg q 6 h.

## 2019-10-12 NOTE — Transfer of Care (Signed)
Immediate Anesthesia Transfer of Care Note  Patient: Vanessa Bowen  Procedure(s) Performed: LAPAROSCOPIC OOPHORECTOMY (Left Abdomen)  Patient Location: PACU  Anesthesia Type:General  Level of Consciousness: awake and patient cooperative  Airway & Oxygen Therapy: Patient Spontanous Breathing  Post-op Assessment: Report given to RN and Post -op Vital signs reviewed and stable  Post vital signs: Reviewed and stable  Last Vitals:  Vitals Value Taken Time  BP 109/48 10/12/19 0845  Temp 97.5   Pulse 93 10/12/19 0846  Resp 14 10/12/19 0846  SpO2 96 % 10/12/19 0846  Vitals shown include unvalidated device data.  Last Pain:  Vitals:   10/12/19 0705  TempSrc: Oral  PainSc: 7       Patients Stated Pain Goal: 6 (72/09/10 6816)  Complications: No apparent anesthesia complications

## 2019-10-12 NOTE — Brief Op Note (Signed)
10/12/2019  8:44 AM  PATIENT:  Vanessa Bowen  44 y.o. female  PRE-OPERATIVE DIAGNOSIS:  pelvic pain suspected left ovarian adhesions  POST-OPERATIVE DIAGNOSIS:  pelvic pain, left ovarian adhesions, right adnexal adhesions from appendix to round ligament, released  PROCEDURE:  Procedure(s): LAPAROSCOPIC OOPHORECTOMY (Left), lysis of adhesions SURGEON:  Surgeon(s) and Role:    Jonnie Kind, MD - Primary  PHYSICIAN ASSISTANT:   ASSISTANTS: Zoila Shutter, CST  ANESTHESIA:   local and general  EBL:  10 mL   BLOOD ADMINISTERED:none  DRAINS: none   LOCAL MEDICATIONS USED:  MARCAINE    and Amount: 30 ml  SPECIMEN:  Source of Specimen:  Left ovary  DISPOSITION OF SPECIMEN:  PATHOLOGY  COUNTS:  YES  TOURNIQUET:  * No tourniquets in log *  DICTATION: .Dragon Dictation  PLAN OF CARE: Discharge to home after PACU  PATIENT DISPOSITION:  PACU - hemodynamically stable.   Delay start of Pharmacological VTE agent (>24hrs) due to surgical blood loss or risk of bleeding: not applicable   Details of procedure: Patient was taken operating room prepped and draped for abdominal procedure with legs in low lithotomy yellowfin support to allow vaginal access.  Abdomen and perineum were prepped and draped, and timeout conducted.  Surgical procedure was confirmed by operative team.  Ancef was administered preoperatively x2 g. Attention was directed the umbilicus were subumbilical, infraumbilical 1 cm vertical incision was made just inside the old laparoscopic incision, with Veress needle used to insufflate the abdomen using loss-of-resistance technique, and insufflating under 6 mm initial intra-abdominal pressure.  Water droplet technique was used to confirm free flow into the abdominal cavity.  3 L of CO2 was insufflated, the blunt laparoscopic trocar inserted under direct visualization without difficulty and photodocumentation of the normal bowel surfaces performed.  Patient was placed in  Trendelenburg position.  There was a brief weight due to initial bradycardia associated with insufflation.  Intra-abdominal pressure was reduced to 12 mm intra-abdominal pressure for the remainder of the procedure from 15 mm.  Suprapubic and right lower quadrant trochars were inserted under direct visualization without difficulty. With the patient in Trendelenburg position the bowel easily mobilized and was elevated out of the pelvis and photos confirmed the left ovary.  The left ureter could be visualized through the retroperitoneum surfaces several centimeters from the area of surgical plans.  On the right side the appendix was elongate, grossly normal with its tip was attached to the right round ligament adhesion by a thin band. Attention was initiated to the left ovary which was elevated placed under traction, and the left IP ligament infiltrated with Marcaine as was the left round ligament remnant.  10 cc were used.  The oophorectomy was then performed using the harmonic Ace 7 coagulation device with excellent hemostasis and safety, well away from retroperitoneal structures. Ovary was extracted through the 11 mm suprapubic trocar the periappendiceal adhesions were then freed up and photo documented.  Pelvis was hemostatic and without any areas of tension. Patient was taken out of Trendelenburg, 120 cc of saline instilled in of the abdomen to assist with evacuation of carbon oxide, the abdomen deflated trochars removed, the fascia closed at the suprapubic and umbilical site using S retractors to visualize the fascia which was grasped with Allis clamps and then ligated with 0 Vicryl to close the trocar sites.  Subcuticular 4-0 Vicryl was used to close all 3 trocar sites at the skin level and Marcaine was injected at the level of  the fascia and under the skin.  A total of 30 cc of Marcaine with epinephrine was used Steri-Strips and Band-Aids were applied and patient recovery room in good condition with  sponge and needle counts correct.

## 2019-10-12 NOTE — H&P (Addendum)
[facility] Visit  09/27/19          Patient name: Vanessa Bowen   MRN [id number]  Date of birth: [birth year]  CC & HPI:  Vanessa Bowen is a 44 y.o. female presenting today for abdominal pain.  She reiterates that the has been having episodes of this pain since 03/12/2018. She notes that some days are better than others, but that they are still painful. The pain is always on the left side.   Per patient, she was experiencing severe enough pain to the point that she couldn't get out of bed, could barely walk, and was feeling very nauseated. She couldn't sleep and is taking her pain relief medications as directed.   She has a prescription for Phenergan for nausea. She has Vicodin for pain. She notes that on really bad days, she is still in significant pain and the medicine is not helping at all, even when doubling up.   ROS:  Review of Systems  Constitutional: Positive for malaise/fatigue. Negative for diaphoresis, fever and weight loss.  HENT: Negative for congestion and sore throat.   Eyes: Negative for blurred vision and double vision.  Respiratory: Negative for cough and shortness of breath.   Cardiovascular: Negative for chest pain, palpitations and leg swelling.  Gastrointestinal: Positive for abdominal pain, constipation (chronic) and nausea. Negative for diarrhea and vomiting.  Genitourinary: Negative for frequency and urgency.  Musculoskeletal: Negative for back pain, falls and myalgias.  Skin: Negative for rash.  Neurological: Positive for weakness. Negative for dizziness and headaches.  Psychiatric/Behavioral: Negative for depression. The patient is not nervous/anxious.      Pertinent History Reviewed:   Reviewed: Significant for  Medical             Past Medical History:  Diagnosis Date  . Anxiety   . Arthritis   . Chronic back pain   . Colitis, ulcerative ([place])   . Depression   . Elevated blood pressure, situational   . Hypothyroidism   .  Migraine                               Surgical Hx:         Past Surgical History:  Procedure Laterality Date  . ANKLE SURGERY    . BREAST ENHANCEMENT SURGERY    . CESAREAN SECTION    . KNEE SURGERY    . LAPAROSCOPIC BILATERAL SALPINGECTOMY  07/27/2012   Procedure: LAPAROSCOPIC BILATERAL SALPINGECTOMY;  Surgeon: Florian Buff, MD;  Location: [facility];  Service: Gynecology;  Laterality: N/A;  . OOPHORECTOMY Right 09/09/2016   Procedure: RIGHT OOPHORECTOMY;  Surgeon: Florian Buff, MD;  Location: [facility];  Service: Gynecology;  Laterality: Right;  . OVARIAN CYST REMOVAL    . VAGINAL HYSTERECTOMY N/A 09/09/2016   Procedure: HYSTERECTOMY VAGINAL;  Surgeon: Florian Buff, MD;  Location: [facility];  Service: Gynecology;  Laterality: N/A;   Medications: Reviewed & Updated - see associated section                       Current Outpatient Medications:  .  ALPRAZolam (XANAX) 1 MG tablet, Take 1 tablet (1 mg total) by mouth 3 (three) times daily as needed for anxiety., Disp: 90 tablet, Rfl: 5 .  conjugated estrogens (PREMARIN) vaginal cream, USE 1 GRAM NIGHTLY AS DIRECTED (Patient taking differently: Place 1 Applicatorful vaginally 2 (two) times a week.  At bedtime`), Disp: 90 g, Rfl: 4 .  DIVIGEL 1 MG/GM GEL, PLACE 1 PACKET ONTO THE SKIN DAILY. (Patient taking differently: Apply 1 packet topically at bedtime. ), Disp: 1 g, Rfl: 3 .  DULoxetine (CYMBALTA) 60 MG capsule, Take 1 capsule (60 mg total) by mouth 2 (two) times daily., Disp: 180 capsule, Rfl: 1 .  HYDROcodone-acetaminophen (NORCO/VICODIN) 5-325 MG tablet, Take 1 tablet by mouth every 6 (six) hours as needed for severe pain. Adding one vicodin daily to current dosing , surgery planned., Disp: 30 tablet, Rfl: 0 .  ibuprofen (ADVIL,MOTRIN) 200 MG tablet, Take 400-600 mg by mouth every 8 (eight) hours as needed (for pain/headaches.)., Disp: , Rfl:  .  levothyroxine (SYNTHROID) 125 MCG tablet, Take 1 tablet (125 mcg total) by mouth daily  before breakfast., Disp: 90 tablet, Rfl: 1 .  promethazine (PHENERGAN) 25 MG tablet, Take 1 tablet (25 mg total) by mouth every 6 (six) hours as needed for nausea, vomiting or refractory nausea / vomiting (nausea)., Disp: 30 tablet, Rfl: 0 .  tiZANidine (ZANAFLEX) 4 MG tablet, Take 4 mg by mouth every 6 (six) hours as needed for muscle spasms. , Disp: , Rfl:  .  traZODone (DESYREL) 100 MG tablet, TAKE 2 TABLETS (200 MG TOTAL) BY MOUTH AT BEDTIME AS NEEDED FOR SLEEP. (Patient taking differently: Take 200 mg by mouth at bedtime. ), Disp: 180 tablet, Rfl: 0   Social History: Reviewed -  reports that she has been smoking cigarettes. She has a 7.50 pack-year smoking history. She has never used smokeless tobacco.  Objective Findings:  Vitals: Last menstrual period 07/24/2016.  PHYSICAL EXAMINATION General appearance - oriented to person, place, and time, normal appearing weight, well hydrated, anxious and in mild to moderate distress Mental status - alert, oriented to person, place, and time, anxious Chest - not examined Heart - not examined Abdomen - not examined Breasts - not examined Skin - normal coloration and turgor, no rashes, no suspicious skin lesions noted  CBC Latest Ref Rng & Units 10/09/2019 08/14/2019 03/12/2018  WBC 4.0 - 10.5 K/uL 10.8(H) 11.4(H) 12.5(H)  Hemoglobin 12.0 - 15.0 g/dL 15.2(H) 14.5 13.6  Hematocrit 36.0 - 46.0 % 45.7 42.8 40.5  Platelets 150 - 400 K/uL 402(H) 389 325    CMP Latest Ref Rng & Units 10/09/2019 08/14/2019 03/12/2018  Glucose 70 - 99 mg/dL 123(H) 98 103(H)  BUN 6 - 20 mg/dL 15 16 18   Creatinine 0.44 - 1.00 mg/dL 0.81 0.81 0.89  Sodium 135 - 145 mmol/L 136 137 135  Potassium 3.5 - 5.1 mmol/L 4.0 4.2 4.3  Chloride 98 - 111 mmol/L 101 101 102  CO2 22 - 32 mmol/L 25 26 20(L)  Calcium 8.9 - 10.3 mg/dL 9.6 9.5 9.8  Total Protein 6.5 - 8.1 g/dL 7.6 - 8.0  Total Bilirubin 0.3 - 1.2 mg/dL 0.6 - 0.4  Alkaline Phos 38 - 126 U/L 57 - 78  AST 15 - 41 U/L  14(L) - 18  ALT 0 - 44 U/L 14 - 21     Assessment & Plan:   A:  1.  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  P:  1. Magnesium Citrate prior to surgery to clear bowels 2. Laparoscopic oophorectomy on 10/12/2019  3. The possibility of ovarian adhesions requiring open surgery reviewed.       Jonnie Kind, MD

## 2019-10-12 NOTE — Op Note (Signed)
10/12/2019  8:44 AM  PATIENT:  Vanessa Bowen  44 y.o. female  PRE-OPERATIVE DIAGNOSIS:  pelvic pain suspected left ovarian adhesions  POST-OPERATIVE DIAGNOSIS:  pelvic pain, left ovarian adhesions, right adnexal adhesions from appendix to round ligament, released  PROCEDURE:  Procedure(s): LAPAROSCOPIC OOPHORECTOMY (Left), lysis of adhesions SURGEON:  Surgeon(s) and Role:    Jonnie Kind, MD - Primary  PHYSICIAN ASSISTANT:   ASSISTANTS: Zoila Shutter, CST  ANESTHESIA:   local and general  EBL:  10 mL   BLOOD ADMINISTERED:none  DRAINS: none   LOCAL MEDICATIONS USED:  MARCAINE    and Amount: 30 ml  SPECIMEN:  Source of Specimen:  Left ovary  DISPOSITION OF SPECIMEN:  PATHOLOGY  COUNTS:  YES  TOURNIQUET:  * No tourniquets in log *  DICTATION: .Dragon Dictation  PLAN OF CARE: Discharge to home after PACU  PATIENT DISPOSITION:  PACU - hemodynamically stable.   Delay start of Pharmacological VTE agent (>24hrs) due to surgical blood loss or risk of bleeding: not applicable   Details of procedure: Patient was taken operating room prepped and draped for abdominal procedure with legs in low lithotomy yellowfin support to allow vaginal access.  Abdomen and perineum were prepped and draped, and timeout conducted.  Surgical procedure was confirmed by operative team.  Ancef was administered preoperatively x2 g. Attention was directed the umbilicus were subumbilical, infraumbilical 1 cm vertical incision was made just inside the old laparoscopic incision, with Veress needle used to insufflate the abdomen using loss-of-resistance technique, and insufflating under 6 mm initial intra-abdominal pressure.  Water droplet technique was used to confirm free flow into the abdominal cavity.  3 L of CO2 was insufflated, the blunt laparoscopic trocar inserted under direct visualization without difficulty and photodocumentation of the normal bowel surfaces performed.  Patient was placed in  Trendelenburg position.  There was a brief weight due to initial bradycardia associated with insufflation.  Intra-abdominal pressure was reduced to 12 mm intra-abdominal pressure for the remainder of the procedure from 15 mm.  Suprapubic and right lower quadrant trochars were inserted under direct visualization without difficulty. With the patient in Trendelenburg position the bowel easily mobilized and was elevated out of the pelvis and photos confirmed the left ovary.  The left ureter could be visualized through the retroperitoneum surfaces several centimeters from the area of surgical plans.  On the right side the appendix was elongate, grossly normal with its tip was attached to the right round ligament adhesion by a thin band. Attention was initiated to the left ovary which was elevated placed under traction, and the left IP ligament infiltrated with Marcaine as was the left round ligament remnant.  10 cc were used.  The oophorectomy was then performed using the harmonic Ace 7 coagulation device with excellent hemostasis and safety, well away from retroperitoneal structures. Ovary was extracted through the 11 mm suprapubic trocar the periappendiceal adhesions were then freed up and photo documented.  Pelvis was hemostatic and without any areas of tension. Patient was taken out of Trendelenburg, 120 cc of saline instilled in of the abdomen to assist with evacuation of carbon oxide, the abdomen deflated trochars removed, the fascia closed at the suprapubic and umbilical site using S retractors to visualize the fascia which was grasped with Allis clamps and then ligated with 0 Vicryl to close the trocar sites.  Subcuticular 4-0 Vicryl was used to close all 3 trocar sites at the skin level and Marcaine was injected at the level of  the fascia and under the skin.  A total of 30 cc of Marcaine with epinephrine was used Steri-Strips and Band-Aids were applied and patient recovery room in good condition with  sponge and needle counts correct.

## 2019-10-12 NOTE — Discharge Instructions (Signed)
Unilateral Salpingo-Oophorectomy, Care After Dr. Glo Herring may be reached on his cell phone at [phone] over the weekend if needed This sheet gives you information about how to care for yourself after your procedure. Your health care provider may also give you more specific instructions. If you have problems or questions, contact your health care provider. What can I expect after the procedure? After the procedure, it is common to have:  Abdominal pain.  Some occasional vaginal bleeding (spotting).  Tiredness. Follow these instructions at home: Incision care   Keep your incision area and your bandage (dressing) clean and dry.  Follow instructions from your health care provider about how to take care of your incision. Make sure you: ? Wash your hands with soap and water before you change your dressing. If soap and water are not available, use hand sanitizer. ? Change your dressing as told by your health care provider. ? Leave stitches (sutures), staples, skin glue, or adhesive strips in place. These skin closures may need to stay in place for 2 weeks or longer. If adhesive strip edges start to loosen and curl up, you may trim the loose edges. Do not remove adhesive strips completely unless your health care provider tells you to do that.  Check your incision area every day for signs of infection. Check for: ? Redness, swelling, or pain. ? Fluid or blood. ? Warmth. ? Pus or a bad smell. Activity  Do not drive or use heavy machinery while taking prescription pain medicine.  Do not drive for 24 hours if you received a medicine to help you relax (sedative).  Take frequent, short walks throughout the day. Rest when you get tired. Ask your health care provider what activities are safe for you.  Avoid activities that require great effort. Also, avoid heavy lifting. Do not lift anything that is heavier than 5 lb (2.3 kg), or the limit that your health care provider tells you, until he or  she says that it is safe to do so.  Do not douche, use tampons, or have sex until your health care provider approves. General instructions  To prevent or treat constipation while you are taking prescription pain medicine, your health care provider may recommend that you: ? Drink enough fluid to keep your urine pale yellow. ? Take over-the-counter or prescription medicines. ? Eat foods that are high in fiber, such as fresh fruits and vegetables, whole grains, and beans. ? Limit foods that are high in fat and processed sugars, such as fried and sweet foods.  Take over-the-counter and prescription medicines only as told by your health care provider.  Do not take baths, swim, or use a hot tub until your health care provider approves. Ask your health care provider if you may take showers. You may only be allowed to take sponge baths.  Wear compression stockings as told by your health care provider. These stockings help to prevent blood clots and reduce swelling in your legs.  Keep all follow-up visits as told by your health care provider. This is important. Contact a health care provider if:  You have pain when you urinate.  You have pus or a bad smelling discharge coming from your vagina.  You have redness, swelling, or pain around your incision.  You have fluid or blood coming from your incision.  Your incision feels warm to the touch.  You have pus or a bad smell coming from your incision.  You have a fever.  Your incision starts to break open.  You have abdominal pain that gets worse or does not get better with medicine.  You develop a rash.  You develop nausea and vomiting.  You feel lightheaded. Get help right away if:  You develop pain in your chest or leg.  You develop shortness of breath.  You faint.  You have increased bleeding from your vagina. Summary  After the procedure, it is common to have pain, tiredness, and occasional bleeding from the  vagina.  Follow instructions from your health care provider about how to take care of your incision.  Check your incision every day for signs of infection and report any symptoms to your health care provider.  Follow instructions from your health care provider about activities and restrictions. This information is not intended to replace advice given to you by your health care provider. Make sure you discuss any questions you have with your health care provider. Document Revised: 05/21/2017 Document Reviewed: 09/17/2016 Elsevier Patient Education  [place] Anesthesia, Adult, Care After This sheet gives you information about how to care for yourself after your procedure. Your health care provider may also give you more specific instructions. If you have problems or questions, contact your health care provider. What can I expect after the procedure? After the procedure, the following side effects are common:  Pain or discomfort at the IV site.  Nausea.  Vomiting.  Sore throat.  Trouble concentrating.  Feeling cold or chills.  Weak or tired.  Sleepiness and fatigue.  Soreness and body aches. These side effects can affect parts of the body that were not involved in surgery. Follow these instructions at home:  For at least 24 hours after the procedure:  Have a responsible adult stay with you. It is important to have someone help care for you until you are awake and alert.  Rest as needed.  Do not: ? Participate in activities in which you could fall or become injured. ? Drive. ? Use heavy machinery. ? Drink alcohol. ? Take sleeping pills or medicines that cause drowsiness. ? Make important decisions or sign legal documents. ? Take care of children on your own. Eating and drinking  Follow any instructions from your health care provider about eating or drinking restrictions.  When you feel hungry, start by eating small amounts of foods that are  soft and easy to digest (bland), such as toast. Gradually return to your regular diet.  Drink enough fluid to keep your urine pale yellow.  If you vomit, rehydrate by drinking water, juice, or clear broth. General instructions  If you have sleep apnea, surgery and certain medicines can increase your risk for breathing problems. Follow instructions from your health care provider about wearing your sleep device: ? Anytime you are sleeping, including during daytime naps. ? While taking prescription pain medicines, sleeping medicines, or medicines that make you drowsy.  Return to your normal activities as told by your health care provider. Ask your health care provider what activities are safe for you.  Take over-the-counter and prescription medicines only as told by your health care provider.  If you smoke, do not smoke without supervision.  Keep all follow-up visits as told by your health care provider. This is important. Contact a health care provider if:  You have nausea or vomiting that does not get better with medicine.  You cannot eat or drink without vomiting.  You have pain that does not get better with medicine.  You are unable to pass  urine.  You develop a skin rash.  You have a fever.  You have redness around your IV site that gets worse. Get help right away if:  You have difficulty breathing.  You have chest pain.  You have blood in your urine or stool, or you vomit blood. Summary  After the procedure, it is common to have a sore throat or nausea. It is also common to feel tired.  Have a responsible adult stay with you for the first 24 hours after general anesthesia. It is important to have someone help care for you until you are awake and alert.  When you feel hungry, start by eating small amounts of foods that are soft and easy to digest (bland), such as toast. Gradually return to your regular diet.  Drink enough fluid to keep your urine pale  yellow.  Return to your normal activities as told by your health care provider. Ask your health care provider what activities are safe for you. This information is not intended to replace advice given to you by your health care provider. Make sure you discuss any questions you have with your health care provider. Document Revised: 06/11/2017 Document Reviewed: 01/22/2017 Elsevier Patient Education  [place].

## 2019-10-12 NOTE — Anesthesia Procedure Notes (Signed)
Procedure Name: Intubation Date/Time: 10/12/2019 7:37 AM Performed by: Vista Deck, CRNA Pre-anesthesia Checklist: Patient identified, Patient being monitored, Timeout performed, Emergency Drugs available and Suction available Patient Re-evaluated:Patient Re-evaluated prior to induction Oxygen Delivery Method: Circle System Utilized Preoxygenation: Pre-oxygenation with 100% oxygen Induction Type: IV induction Laryngoscope Size: Mac and 3 Grade View: Grade I Tube type: Oral Tube size: 7.0 mm Number of attempts: 1 Airway Equipment and Method: stylet and Oral airway Placement Confirmation: ETT inserted through vocal cords under direct vision,  positive ETCO2 and breath sounds checked- equal and bilateral Secured at: 21 cm Tube secured with: Tape Dental Injury: Teeth and Oropharynx as per pre-operative assessment

## 2019-10-12 NOTE — Interval H&P Note (Signed)
History and Physical Interval Note:  10/12/2019 7:06 AM  Vanessa Bowen  has presented today for surgery, with the diagnosis of LLQ Pain.  The various methods of treatment have been discussed with the patient and family. After consideration of risks, benefits and other options for treatment, the patient has consented to  Procedure(s): LAPAROSCOPIC OOPHORECTOMY (Left) as a surgical intervention.  The patient's history has been reviewed, patient examined, no change in status, stable for surgery.  I have reviewed the patient's chart and labs.  Questions were answered to the patient's satisfaction.   She had 6 bowel movements in response to the Mag Citrate, last bm this a.m.   Vanessa Bowen

## 2019-10-13 ENCOUNTER — Telehealth: Payer: Self-pay | Admitting: Obstetrics and Gynecology

## 2019-10-13 LAB — SURGICAL PATHOLOGY

## 2019-10-13 NOTE — Telephone Encounter (Signed)
Multiple calls back and forth with patient.  Pt was unable to get the additional Vicodin Rx filled , as pharmacy didi not see that Rx was for postop  Increase in med dosing.  That has been corrected.  pateint has my contacts if further pain isssues arise, and she knows I will be in [facility] tomorrow if she needs to be seen.  Patient had concerns over suprapubic incision , and was unable to send 2 pics in myChart , so sent to my phone.  Incision looks fine, and pics has been deleted.

## 2019-10-14 NOTE — Discharge Summary (Signed)
Physician Discharge Summary  Patient ID: Vanessa Bowen MRN: [id number] DOB/AGE: 45-09-1975 45 y.o.  Admit date: 10/12/2019 Discharge date: 10/12/2019  Admission Diagnoses: Chronic pelvic pain suspected pelvic adhesions  Discharge Diagnoses:  Principal Problem:   Pelvic pain Scanty pelvic adhesions, small small appendiceal adhesion, released  Discharged Condition: good clinically stable, but patient's postop pain surprising in comparison to the excellent postsurgical appearance of the pelvis  Hospital Course: Patient was taken to day surgery, underwent laparoscopic removal of the left ovary and freeing up of the right sided adhesions from the tip of the appendix to the right round ligament there were thought perhaps to be explaining some discomfort on that side.  Local anesthesia was applied and the left infundibulopelvic ligament as well as in the abdominal wall at each trocar site Postoperative the patient had several calls complaining of pelvic discomfort she was given a refill of the Vicodin which she takes for chronic back pain.  There was some logistical challenges getting that message about extending Dr. Brooke Bonito medications x30 tablets.  This was clarified on Saturday Patient will be followed up in 1 week.  Pain tolerance is seen as quite low  Consults: None  Significant Diagnostic Studies: labs:  CBC    Component Value Date/Time   WBC 10.8 (H) 10/09/2019 1442   RBC 5.01 10/09/2019 1442   HGB 15.2 (H) 10/09/2019 1442   HCT 45.7 10/09/2019 1442   PLT 402 (H) 10/09/2019 1442   MCV 91.2 10/09/2019 1442   MCH 30.3 10/09/2019 1442   MCHC 33.3 10/09/2019 1442   RDW 12.5 10/09/2019 1442   LYMPHSABS 4.5 (H) 08/14/2019 1855   MONOABS 0.6 08/14/2019 1855   EOSABS 0.2 08/14/2019 1855   BASOSABS 0.1 08/14/2019 1855    and surgical pathology Component 2 d ago  SURGICAL PATHOLOGY SURGICAL PATHOLOGY  CASE: [id number]  PATIENT: Vanessa Bowen  Surgical Pathology Report       Clinical History: LLQ pain      FINAL MICROSCOPIC DIAGNOSIS:   A. 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    GROSS DESCRIPTION:   Received fresh is a 2.5 x 1.7 x 1.0 cm intact ovary. The cut surface is  tan-white to pale yellow. There are no discrete masses. Sectioned and  entirely submitted in 4 cassettes. [facility] 10/12/2019)    Final Diagnosis performed by Gillie Manners, MD.  Electronically  signed 10/13/2019  Technical component performed at [facility], [place]  [address] [place], [place] [address].  Professional component performed at [employer]. [facility],  [place] [address] [place], [place] [address].  Immunohistochemistry Technical component (if applicable) was performed  at [facility]. [address] [place],  [place], [place] [address].  IMMUNOHISTOCHEMISTRY DISCLAIMER (if applicable):  Some of these immunohistochemical stains may have been developed and the  performance characteristics determine by [facility]. Some  may not have been cleared or approved by the U.S. Food and Drug  Administration. The FDA has determined that such clearance or approval  is not necessary. This test is used for clinical purposes. It should not  be regarded as investigational or for research. This laboratory is  certified under the [place]  (CLIA-88) as qualified to perform high complexity clinical laboratory  testing. The controls stained appropriately.      Treatments: surgery: Laparoscopic left oophorectomy and laparoscopic lysis  of pelvic adhesions around appendix, with photodocumentation  Discharge Exam: Blood pressure 117/68, pulse 70, temperature 98.2 F (36.8 C), temperature source Oral, resp. rate 17, height 5' 3"  (1.6 m), weight 78.9 kg,  last menstrual period 07/24/2016, SpO2 94 %. General appearance: cooperative and appears stated age GI: soft, non-tender; bowel sounds normal; no masses,  no organomegaly Extremities: Homans sign is negative, no sign of DVT  Disposition: Discharge disposition: 01-Home or Self Care     pt with several calls postprocedure for pain control.  Discharge Instructions    Call MD for:   Complete by: As directed    If you experience a greater amount of hot flashes than you are used to, we can switch from Luxora for hormone support to estrogen patches.  Please advise me if this happens.  Please be sure to use the Divigel or discuss hormone patches   Call MD for:  persistant nausea and vomiting   Complete by: As directed    Call MD for:  severe uncontrolled pain   Complete by: As directed    Call MD for:  temperature >100.4   Complete by: As directed    Diet - low sodium heart healthy   Complete by: As directed    Discharge instructions   Complete by: As directed    Dr. Glo Herring may be reached on his cell phone at [phone] over the weekend   Increase activity slowly   Complete by: As directed      Allergies as of 10/12/2019      Reactions   Doxycycline Nausea And Vomiting   Sulfonamide Derivatives Nausea And Vomiting      Medication List    TAKE these medications   ALPRAZolam 1 MG tablet Commonly known as: XANAX Take 1 tablet (1 mg total) by mouth 3 (three) times daily as needed for anxiety.   Divigel 1 MG/GM Gel Generic drug: Estradiol PLACE 1 PACKET ONTO THE SKIN DAILY. What changed: See the new instructions.   DULoxetine 60 MG capsule Commonly known as: CYMBALTA Take 1 capsule (60 mg total) by mouth 2 (two) times daily.   HYDROcodone-acetaminophen 5-325 MG tablet Commonly known as: NORCO/VICODIN One tablet by mouth every six hours as needed for pain. 30 days between refills.   ibuprofen 200 MG tablet Commonly known as: ADVIL Take 400-600 mg by mouth every 8  (eight) hours as needed (for pain/headaches.).   levothyroxine 125 MCG tablet Commonly known as: SYNTHROID Take 1 tablet (125 mcg total) by mouth daily before breakfast.   Premarin vaginal cream Generic drug: conjugated estrogens USE 1 GRAM NIGHTLY AS DIRECTED What changed:   how much to take  how to take this  when to take this  additional instructions   promethazine 25 MG tablet Commonly known as: PHENERGAN Take 1 tablet (25 mg total) by mouth every 6 (six) hours as needed for nausea, vomiting or refractory nausea / vomiting (nausea).   tiZANidine 4 MG tablet Commonly known as: ZANAFLEX Take 4 mg by mouth every 6 (six) hours as needed for muscle spasms.   traZODone 100 MG tablet Commonly known as: DESYREL TAKE 2 TABLETS (200 MG TOTAL) BY MOUTH AT BEDTIME AS NEEDED FOR SLEEP. What changed: when to take this      Follow-up Information    Jonnie Kind, MD In 2 weeks.   Specialties: Obstetrics and Gynecology, Radiology Why: Postoperative visit Contact information: [place] [address] [phone]  Signed: Jonnie Kind 10/14/2019, 5:48 PM

## 2019-10-18 ENCOUNTER — Telehealth: Payer: Self-pay | Admitting: Obstetrics and Gynecology

## 2019-10-18 NOTE — Telephone Encounter (Signed)
Patient has definitely improved , has several small questions about driving, bandaid removal, lifting. Patient will keep appointment next week but she sounds fine. She has less pain than before the surgery.

## 2019-10-18 NOTE — Telephone Encounter (Signed)
Patient called, stated that she sent Dr. Glo Herring a mychart message.  She is wanting to know when she can drive again.  She's not comfortable sitting up yet.  She wants to speak to a nurse.    [phone]

## 2019-10-18 NOTE — Telephone Encounter (Signed)
Telephoned patient at home number. Patient states still having some pain and was concerned over when to drive. Advised patient as long as taking meds should not drive. Also, advised to be moving around some. Patient has post-op on Wed May 5 and will discuss with Dr. Glo Herring. Patient voiced understanding.

## 2019-10-25 ENCOUNTER — Encounter: Payer: Self-pay | Admitting: Obstetrics and Gynecology

## 2019-10-25 ENCOUNTER — Other Ambulatory Visit: Payer: Self-pay

## 2019-10-25 ENCOUNTER — Ambulatory Visit (INDEPENDENT_AMBULATORY_CARE_PROVIDER_SITE_OTHER): Payer: 59 | Admitting: Obstetrics and Gynecology

## 2019-10-25 VITALS — BP 120/80 | HR 86 | Ht 63.0 in | Wt 169.2 lb

## 2019-10-25 DIAGNOSIS — R102 Pelvic and perineal pain: Secondary | ICD-10-CM

## 2019-10-25 DIAGNOSIS — Z09 Encounter for follow-up examination after completed treatment for conditions other than malignant neoplasm: Secondary | ICD-10-CM

## 2019-10-25 MED ORDER — CEPHALEXIN 500 MG PO CAPS: 500.0000 mg | ORAL_CAPSULE | Freq: Four times a day (QID) | ORAL | 1 refills | Status: DC

## 2019-10-25 NOTE — Progress Notes (Addendum)
PATIENT ID: Vanessa Bowen, female     DOB: [birth year], 44 y.o.     MRN: [id number]   Subjective:  Vanessa Bowen is a 44 y.o. female now 2 weeks status post left oophorectomy. And lysis of adhesions.  She does note some pain with urination. She has an inflamed boil on her inner left thigh currently tender, less than 1 cm.  She is using estrogen gels and vaginal creams in place of her removed ovary.   Review of Systems Negative except dysuria    Diet:   normal   Bowel movements : normal.  Pain is controlled without any medications.  Objective:  BP 120/80 (BP Location: Right Arm, Patient Position: Sitting, Cuff Size: Normal)   Pulse 86   Ht 5' 3"  (1.6 m)   Wt 169 lb 3.2 oz (76.7 kg)   LMP 07/24/2016   BMI 29.97 kg/m  General:Well developed, well nourished.  No acute distress. Abdomen: Bowel sounds normal, soft, non-tender. Pelvic Exam:not done    Incision(s): Healing well, no drainage, no erythema, no hernia, no swelling, no dehiscence slightly thick at suprapubic trochar site.     Assessment:  Post-Op 2 weeks s/p left oophorectomy   Doing great postoperatively.   Plan:  1.Wound care discussed   2. Current medications. Topical vaginal estrogen and transdermal E2 3. Activity restrictions: none 4. return to work: not applicable. 5. Follow up in 1 yr. 6. Rx Keflex 500 qid x 7 d    By signing my name below, I, [employer], attest that this documentation has been prepared under the direction and in the presence of Jonnie Kind, MD. Electronically Signed: [place]. 10/25/19. 11:00 AM.  I personally performed the services described in this documentation, which was SCRIBED in my presence. The recorded information has been reviewed and considered accurate. It has been edited as necessary during review. Jonnie Kind, MD

## 2019-10-30 ENCOUNTER — Telehealth: Payer: Self-pay

## 2019-10-30 ENCOUNTER — Telehealth (INDEPENDENT_AMBULATORY_CARE_PROVIDER_SITE_OTHER): Payer: 59 | Admitting: Psychiatry

## 2019-10-30 ENCOUNTER — Other Ambulatory Visit: Payer: Self-pay

## 2019-10-30 DIAGNOSIS — F411 Generalized anxiety disorder: Secondary | ICD-10-CM

## 2019-10-30 DIAGNOSIS — F3341 Major depressive disorder, recurrent, in partial remission: Secondary | ICD-10-CM | POA: Diagnosis not present

## 2019-10-30 DIAGNOSIS — F410 Panic disorder [episodic paroxysmal anxiety] without agoraphobia: Secondary | ICD-10-CM

## 2019-10-30 MED ORDER — TRAZODONE HCL 100 MG PO TABS: 200.0000 mg | ORAL_TABLET | Freq: Every day | ORAL | 1 refills | Status: DC

## 2019-10-30 MED ORDER — DULOXETINE HCL 60 MG PO CPEP: 60.0000 mg | ORAL_CAPSULE | Freq: Two times a day (BID) | ORAL | 1 refills | Status: DC

## 2019-10-30 NOTE — Telephone Encounter (Signed)
No refill.  Dr. Glo Herring gave some with 30 day limit on 10-12-2019

## 2019-10-30 NOTE — Telephone Encounter (Signed)
Hydrocodone-Acetaminophen 5/350m  Qty 30 Tablets  One tablet by mouth every six hours as needed for pain. 30 days between refills.   PATIENT USES [facility] IN [place]

## 2019-10-30 NOTE — Progress Notes (Signed)
[place] MD/PA/NP OP Progress Note  10/30/2019 10:43 AM Vanessa Bowen  MRN:  [id number] Interview was conducted by phone and I verified that I was speaking with the correct person using two identifiers. I discussed the limitations of evaluation and management by telemedicine and  the availability of in person appointments. Patient expressed understanding and agreed to proceed.  Chief Complaint: "I am doing well".  HPI: 44yo married female with panic disorder, GAD and MDD.She has beendepressed,anxious and having more problems with sleep due to stress related toCOVID. Shereportedthat alprazolamworks better than clonazepamdidso we changed it back.We increased duloxetine and trazodone doses. Jawana now reports feelingless depressed and less anxious now. She does not work at this time. Sleep adequate.Alprazolam used as neededbut she takes it daily several times splitting tablets in half.Trazodone taken every night with good effect. No SI, normal appetite, no psychosis.Last month she had her remaining ovary removed laparoscopically and she now reports having no abdominal pain.   Visit Diagnosis:    ICD-10-CM   1. GAD (generalized anxiety disorder)  F41.1   2. Panic disorder  F41.0   3. Major depressive disorder, recurrent episode, in partial remission (HCC)  F33.41     Past Psychiatric History: Please see intake H&P.  Past Medical History:  Past Medical History:  Diagnosis Date  . Anxiety   . Arthritis   . Chronic back pain   . Colitis, ulcerative ([place])   . Depression   . Hypothyroidism   . Migraine     Past Surgical History:  Procedure Laterality Date  . ANKLE SURGERY    . BREAST ENHANCEMENT SURGERY    . CESAREAN SECTION    . KNEE SURGERY Right    arthroscopy  . LAPAROSCOPIC BILATERAL SALPINGECTOMY  07/27/2012   Procedure: LAPAROSCOPIC BILATERAL SALPINGECTOMY;  Surgeon: Florian Buff, MD;  Location: [facility];  Service: Gynecology;  Laterality: N/A;  . OOPHORECTOMY Right  09/09/2016   Procedure: RIGHT OOPHORECTOMY;  Surgeon: Florian Buff, MD;  Location: [facility];  Service: Gynecology;  Laterality: Right;  . OVARIAN CYST REMOVAL    . VAGINAL HYSTERECTOMY N/A 09/09/2016   Procedure: HYSTERECTOMY VAGINAL;  Surgeon: Florian Buff, MD;  Location: [facility];  Service: Gynecology;  Laterality: N/A;    Family Psychiatric History: Reviewed.  Family History:  Family History  Problem Relation Age of Onset  . Cancer Maternal Grandmother   . Cancer Maternal Grandfather   . Alcohol abuse Maternal Grandfather   . Hypothyroidism Daughter   . Bipolar disorder Daughter   . Anxiety disorder Daughter   . Depression Mother   . Alcohol abuse Mother   . Drug abuse Sister   . Anxiety disorder Sister     Social History:  Social History   Socioeconomic History  . Marital status: Legally Separated    Spouse name: Not on file  . Number of children: 2  . Years of education: Not on file  . Highest education level: Not on file  Occupational History  . Not on file  Tobacco Use  . Smoking status: Current Every Day Smoker    Packs/day: 0.50    Years: 15.00    Pack years: 7.50    Types: Cigarettes  . Smokeless tobacco: Never Used  Substance and Sexual Activity  . Alcohol use: Yes    Comment: Occasionally  . Drug use: No  . Sexual activity: Not Currently    Birth control/protection: Surgical    Comment: hyst  Other Topics Concern  . Not  on file  Social History Narrative  . Not on file   Social Determinants of Health   Financial Resource Strain:   . Difficulty of Paying Living Expenses:   Food Insecurity:   . Worried About Charity fundraiser in the Last Year:   . Arboriculturist in the Last Year:   Transportation Needs:   . Film/video editor (Medical):   Marland Kitchen Lack of Transportation (Non-Medical):   Physical Activity:   . Days of Exercise per Week:   . Minutes of Exercise per Session:   Stress:   . Feeling of Stress :   Social Connections:   . Frequency  of Communication with Friends and Family:   . Frequency of Social Gatherings with Friends and Family:   . Attends Religious Services:   . Active Member of Clubs or Organizations:   . Attends Archivist Meetings:   Marland Kitchen Marital Status:     Allergies:  Allergies  Allergen Reactions  . Doxycycline Nausea And Vomiting  . Sulfonamide Derivatives Nausea And Vomiting    Metabolic Disorder Labs: Lab Results  Component Value Date   HGBA1C 5.2 04/27/2017   MPG 103 04/27/2017   MPG 100 07/21/2016   No results found for: PROLACTIN No results found for: CHOL, TRIG, HDL, CHOLHDL, VLDL, LDLCALC Lab Results  Component Value Date   TSH 0.11 (L) 08/22/2019   TSH 0.06 (L) 04/12/2019    Therapeutic Level Labs: No results found for: LITHIUM No results found for: VALPROATE No components found for:  CBMZ  Current Medications: Current Outpatient Medications  Medication Sig Dispense Refill  . ALPRAZolam (XANAX) 1 MG tablet Take 1 tablet (1 mg total) by mouth 3 (three) times daily as needed for anxiety. 90 tablet 5  . cephALEXin (KEFLEX) 500 MG capsule Take 1 capsule (500 mg total) by mouth 4 (four) times daily. 28 capsule 1  . conjugated estrogens (PREMARIN) vaginal cream USE 1 GRAM NIGHTLY AS DIRECTED (Patient taking differently: Place 1 Applicatorful vaginally 2 (two) times a week. At bedtime`) 90 g 4  . DIVIGEL 1 MG/GM GEL PLACE 1 PACKET ONTO THE SKIN DAILY. (Patient taking differently: Apply 1 packet topically at bedtime. ) 1 g 3  . [START ON 12/25/2019] DULoxetine (CYMBALTA) 60 MG capsule Take 1 capsule (60 mg total) by mouth 2 (two) times daily. 180 capsule 1  . HYDROcodone-acetaminophen (NORCO/VICODIN) 5-325 MG tablet One tablet by mouth every six hours as needed for pain. 30 days between refills. (Patient not taking: Reported on 10/25/2019) 30 tablet 0  . ibuprofen (ADVIL,MOTRIN) 200 MG tablet Take 400-600 mg by mouth every 8 (eight) hours as needed (for pain/headaches.).    Marland Kitchen  levothyroxine (SYNTHROID) 125 MCG tablet Take 1 tablet (125 mcg total) by mouth daily before breakfast. 90 tablet 1  . promethazine (PHENERGAN) 25 MG tablet Take 1 tablet (25 mg total) by mouth every 6 (six) hours as needed for nausea, vomiting or refractory nausea / vomiting (nausea). (Patient not taking: Reported on 10/25/2019) 30 tablet 0  . tiZANidine (ZANAFLEX) 4 MG tablet Take 4 mg by mouth every 6 (six) hours as needed for muscle spasms.     . traMADol (ULTRAM) 50 MG tablet Take 1-2 tablets (50-100 mg total) by mouth every 6 (six) hours as needed for moderate pain or severe pain. Postop pain (Patient not taking: Reported on 10/25/2019) 30 tablet 0  . traZODone (DESYREL) 100 MG tablet Take 2 tablets (200 mg total) by mouth at bedtime.  180 tablet 1   No current facility-administered medications for this visit.     Psychiatric Specialty Exam: Review of Systems  Psychiatric/Behavioral: Positive for sleep disturbance. The patient is nervous/anxious.   All other systems reviewed and are negative.   Last menstrual period 07/24/2016.There is no height or weight on file to calculate BMI.  General Appearance: NA  Eye Contact:  NA  Speech:  Clear and Coherent and Normal Rate  Volume:  Normal  Mood:  Anxious  Affect:  NA  Thought Process:  Goal Directed and Linear  Orientation:  Full (Time, Place, and Person)  Thought Content: Logical   Suicidal Thoughts:  No  Homicidal Thoughts:  No  Memory:  Immediate;   Good Recent;   Good Remote;   Good  Judgement:  Good  Insight:  Good  Psychomotor Activity:  NA  Concentration:  Concentration: Good  Recall:  Good  Fund of Knowledge: Good  Language: Good  Akathisia:  Negative  Handed:  Right  AIMS (if indicated): not done  Assets:  Communication Skills Desire for Improvement Financial Resources/Insurance Housing Social Support  ADL's:  Intact  Cognition: WNL  Sleep:  Fair   Screenings: PHQ2-9     Office Visit from 09/07/2019 in [place] Office Visit from 03/18/2016 in [facility]  PHQ-2 Total Score  0  0       Assessment and Plan: 44yo married female with panic disorder, GAD and MDD.She has beendepressed,anxious and having more problems with sleep due to stress related toCOVID. Shereportedthat alprazolamworks better than clonazepamdidso we changed it back.We increased duloxetine and trazodone doses. Lilygrace now reports feelingless depressed and less anxious now. She does not work at this time. Sleep adequate.Alprazolam used as neededbut she takes it daily several times splitting tablets in half.Trazodone taken every night with good effect. No SI, normal appetite, no psychosis.Last month she had her remaining ovary removed laparoscopically and she now reports having no abdominal pain.   Dx: Panic disorder; GAD; MDD recurrent in partial remission  Plan:Continue duloxetine 60 mg bid,alprazolam 1 mg tid prn anxiety and trazodone 200 mg at HS for sleep.The plan was discussed with patient who had an opportunity to ask questions and these were all answered. I spend20 minutes inphone contact with patient. Next appointment in74month.    OStephanie Acre MD 10/30/2019, 10:43 AM

## 2019-10-31 NOTE — Telephone Encounter (Signed)
I called patient and advised. She is going out of town, and will be at [facility] when that 30 days is up.  She is asking you if you would be able to send a Rx to the pharmacy there?  I have told her I would ask you then call her to advise and get pharmacy info if needed.

## 2019-10-31 NOTE — Telephone Encounter (Signed)
Patient said she had surgery with Dr Glo Herring but said it was a 7-day supply. Patient would like to speak with nurse.

## 2019-11-01 NOTE — Telephone Encounter (Signed)
Patient says she will be back in town on 5/22, and if you can refill it, just send it to her normal pharmacy, [place].  Previous Rx was to last through 5/22.

## 2019-11-01 NOTE — Telephone Encounter (Signed)
[place], [place]?  I do not have license in [facility].

## 2019-11-06 MED ORDER — HYDROCODONE-ACETAMINOPHEN 5-325 MG PO TABS: ORAL_TABLET | ORAL | 0 refills | Status: DC

## 2019-11-14 ENCOUNTER — Other Ambulatory Visit (HOSPITAL_COMMUNITY): Payer: Self-pay | Admitting: Psychiatry

## 2019-11-24 ENCOUNTER — Ambulatory Visit: Payer: 59 | Admitting: "Endocrinology

## 2019-11-28 LAB — TSH: TSH: 0.06 mIU/L — ABNORMAL LOW

## 2019-11-28 LAB — T4, FREE: Free T4: 1.8 ng/dL (ref 0.8–1.8)

## 2019-12-01 ENCOUNTER — Other Ambulatory Visit: Payer: Self-pay | Admitting: Obstetrics and Gynecology

## 2019-12-07 ENCOUNTER — Encounter: Payer: Self-pay | Admitting: "Endocrinology

## 2019-12-07 ENCOUNTER — Ambulatory Visit (INDEPENDENT_AMBULATORY_CARE_PROVIDER_SITE_OTHER): Payer: 59 | Admitting: "Endocrinology

## 2019-12-07 ENCOUNTER — Other Ambulatory Visit: Payer: Self-pay

## 2019-12-07 VITALS — BP 107/72 | HR 80 | Ht 63.0 in | Wt 172.2 lb

## 2019-12-07 DIAGNOSIS — E6609 Other obesity due to excess calories: Secondary | ICD-10-CM | POA: Diagnosis not present

## 2019-12-07 DIAGNOSIS — E038 Other specified hypothyroidism: Secondary | ICD-10-CM | POA: Diagnosis not present

## 2019-12-07 DIAGNOSIS — Z6830 Body mass index (BMI) 30.0-30.9, adult: Secondary | ICD-10-CM

## 2019-12-07 DIAGNOSIS — E66811 Obesity, class 1: Secondary | ICD-10-CM

## 2019-12-07 MED ORDER — LEVOTHYROXINE SODIUM 112 MCG PO TABS: 112.0000 ug | ORAL_TABLET | Freq: Every day | ORAL | 1 refills | Status: DC

## 2019-12-07 NOTE — Progress Notes (Signed)
12/07/2019        Endocrinology follow-up note   Subjective:    Patient ID: Vanessa Bowen, female    DOB: Dec 16, 1975, PCP Redmond School, MD   Past Medical History:  Diagnosis Date  . Anxiety   . Arthritis   . Chronic back pain   . Colitis, ulcerative ([place])   . Depression   . Hypothyroidism   . Migraine    Past Surgical History:  Procedure Laterality Date  . ANKLE SURGERY    . BREAST ENHANCEMENT SURGERY    . CESAREAN SECTION    . KNEE SURGERY Right    arthroscopy  . LAPAROSCOPIC BILATERAL SALPINGECTOMY  07/27/2012   Procedure: LAPAROSCOPIC BILATERAL SALPINGECTOMY;  Surgeon: Florian Buff, MD;  Location: [facility];  Service: Gynecology;  Laterality: N/A;  . OOPHORECTOMY Right 09/09/2016   Procedure: RIGHT OOPHORECTOMY;  Surgeon: Florian Buff, MD;  Location: [facility];  Service: Gynecology;  Laterality: Right;  . OVARIAN CYST REMOVAL    . VAGINAL HYSTERECTOMY N/A 09/09/2016   Procedure: HYSTERECTOMY VAGINAL;  Surgeon: Florian Buff, MD;  Location: [facility];  Service: Gynecology;  Laterality: N/A;   Social History   Socioeconomic History  . Marital status: Legally Separated    Spouse name: Not on file  . Number of children: 2  . Years of education: Not on file  . Highest education level: Not on file  Occupational History  . Not on file  Tobacco Use  . Smoking status: Current Every Day Smoker    Packs/day: 0.50    Years: 15.00    Pack years: 7.50    Types: Cigarettes  . Smokeless tobacco: Never Used  Vaping Use  . Vaping Use: Never used  Substance and Sexual Activity  . Alcohol use: Yes    Comment: Occasionally  . Drug use: No  . Sexual activity: Not Currently    Birth control/protection: Surgical    Comment: hyst  Other Topics Concern  . Not on file  Social History Narrative  . Not on file   Social Determinants of Health   Financial Resource Strain:   . Difficulty of Paying Living Expenses:   Food Insecurity:   . Worried About Charity fundraiser in  the Last Year:   . Arboriculturist in the Last Year:   Transportation Needs:   . Film/video editor (Medical):   Marland Kitchen Lack of Transportation (Non-Medical):   Physical Activity:   . Days of Exercise per Week:   . Minutes of Exercise per Session:   Stress:   . Feeling of Stress :   Social Connections:   . Frequency of Communication with Friends and Family:   . Frequency of Social Gatherings with Friends and Family:   . Attends Religious Services:   . Active Member of Clubs or Organizations:   . Attends Archivist Meetings:   Marland Kitchen Marital Status:    Outpatient Encounter Medications as of 12/07/2019  Medication Sig  . ALPRAZolam (XANAX) 1 MG tablet TAKE 1 TABLET BY MOUTH 3 TIMES DAILY AS NEEDED FOR ANXIETY.  Marland Kitchen conjugated estrogens (PREMARIN) vaginal cream USE 1 GRAM NIGHTLY AS DIRECTED (Patient taking differently: Place 1 Applicatorful vaginally 2 (two) times a week. At bedtime`)  . DIVIGEL 1 MG/GM GEL PLACE 1 PACKET ONTO THE SKIN DAILY. (Patient taking differently: Apply 1 packet topically at bedtime. )  . [START ON 12/25/2019] DULoxetine (CYMBALTA) 60 MG capsule Take 1 capsule (  60 mg total) by mouth 2 (two) times daily.  Marland Kitchen HYDROcodone-acetaminophen (NORCO/VICODIN) 5-325 MG tablet One tablet by mouth every six hours as needed for pain. 30 days between refills.  Marland Kitchen ibuprofen (ADVIL,MOTRIN) 200 MG tablet Take 400-600 mg by mouth every 8 (eight) hours as needed (for pain/headaches.).  Marland Kitchen levothyroxine (SYNTHROID) 112 MCG tablet Take 1 tablet (112 mcg total) by mouth daily before breakfast.  . tiZANidine (ZANAFLEX) 4 MG tablet Take 4 mg by mouth every 6 (six) hours as needed for muscle spasms.   . traZODone (DESYREL) 100 MG tablet Take 2 tablets (200 mg total) by mouth at bedtime.  . [DISCONTINUED] cephALEXin (KEFLEX) 500 MG capsule TAKE 1 CAPSULE BY MOUTH FOUR TIMES A DAY  . [DISCONTINUED] dicyclomine (BENTYL) 20 MG tablet Take 1 tablet (20 mg total) by mouth 3 (three) times daily as  needed for up to 30 days for spasms.  . [DISCONTINUED] levothyroxine (SYNTHROID) 125 MCG tablet Take 1 tablet (125 mcg total) by mouth daily before breakfast.  . [DISCONTINUED] promethazine (PHENERGAN) 25 MG tablet Take 1 tablet (25 mg total) by mouth every 6 (six) hours as needed for nausea, vomiting or refractory nausea / vomiting (nausea). (Patient not taking: Reported on 10/25/2019)  . [DISCONTINUED] traMADol (ULTRAM) 50 MG tablet Take 1-2 tablets (50-100 mg total) by mouth every 6 (six) hours as needed for moderate pain or severe pain. Postop pain (Patient not taking: Reported on 10/25/2019)   No facility-administered encounter medications on file as of 12/07/2019.   ALLERGIES: Allergies  Allergen Reactions  . Doxycycline Nausea And Vomiting  . Sulfonamide Derivatives Nausea And Vomiting   VACCINATION STATUS:  There is no immunization history on file for this patient.  HPI  44  yr old female with medical history.  She is returning with repeat thyroid function test for follow-up of longstanding hypothyroidism.    She has hypothyroidism diagnosed at approximate age of 23 years.  She is currently on levothyroxine 125 mcg p.o. every morning.  She  reports better consistency taking her medication at this time.  She has no new complaints today.   She denies palpitations, tremors, nor heat/cold intolerance.  Her previsit labs are consistent with over replacement. -She is concerned about her inability to lose significant amount of weight.  Review of Systems   Limited as above.  Objective:    BP 107/72   Pulse 80   Ht 5' 3"  (1.6 m)   Wt 172 lb 3.2 oz (78.1 kg)   LMP 07/24/2016   BMI 30.50 kg/m   Wt Readings from Last 3 Encounters:  12/07/19 172 lb 3.2 oz (78.1 kg)  10/25/19 169 lb 3.2 oz (76.7 kg)  10/12/19 174 lb (78.9 kg)     Physical Exam- Limited  Constitutional:  Body mass index is 30.5 kg/m. , not in acute distress, normal state of mind Eyes:  EOMI, no exophthalmos Neck:  Supple Thyroid: No gross goiter Respiratory: Adequate breathing efforts Musculoskeletal: no gross deformities, strength intact in all four extremities, no gross restriction of joint movements Skin:  no rashes, no hyperemia Neurological: no tremor with outstretched hands,     CMP     Component Value Date/Time   NA 136 10/09/2019 1442   K 4.0 10/09/2019 1442   CL 101 10/09/2019 1442   CO2 25 10/09/2019 1442   GLUCOSE 123 (H) 10/09/2019 1442   BUN 15 10/09/2019 1442   CREATININE 0.81 10/09/2019 1442   CREATININE 1.05 04/27/2017 1226   CALCIUM 9.6  10/09/2019 1442   PROT 7.6 10/09/2019 1442   ALBUMIN 4.5 10/09/2019 1442   AST 14 (L) 10/09/2019 1442   ALT 14 10/09/2019 1442   ALKPHOS 57 10/09/2019 1442   BILITOT 0.6 10/09/2019 1442   GFRNONAA >60 10/09/2019 1442   GFRAA >60 10/09/2019 1442   Recent Results (from the past 2160 hour(s))  SARS CORONAVIRUS 2 (TAT 6-24 HRS) Nasopharyngeal Nasopharyngeal Swab     Status: None   Collection Time: 10/09/19  7:24 AM   Specimen: Nasopharyngeal Swab  Result Value Ref Range   SARS Coronavirus 2 NEGATIVE NEGATIVE    Comment: (NOTE) SARS-CoV-2 target nucleic acids are NOT DETECTED. The SARS-CoV-2 RNA is generally detectable in upper and lower respiratory specimens during the acute phase of infection. Negative results do not preclude SARS-CoV-2 infection, do not rule out co-infections with other pathogens, and should not be used as the sole basis for treatment or other patient management decisions. Negative results must be combined with clinical observations, patient history, and epidemiological information. The expected result is Negative. Fact Sheet for Patients: SugarRoll.be Fact Sheet for Healthcare Providers: https://www.woods-mathews.com/ This test is not yet approved or cleared by the [place] FDA and  has been authorized for detection and/or diagnosis of SARS-CoV-2 by FDA under an  Emergency Use Authorization (EUA). This EUA will remain  in effect (meaning this test can be used) for the duration of the COVID-19 declaration under Section 56 4(b)(1) of the Act, 21 U.S.C. section 360bbb-3(b)(1), unless the authorization is terminated or revoked sooner. Performed at [facility], [place] [address] [place], [place] [address]   Urinalysis, Routine w reflex microscopic     Status: Abnormal   Collection Time: 10/09/19  2:39 PM  Result Value Ref Range   Color, Urine YELLOW YELLOW   APPearance HAZY (A) CLEAR   Specific Gravity, Urine 1.017 1.005 - 1.030   pH 5.0 5.0 - 8.0   Glucose, UA NEGATIVE NEGATIVE mg/dL   Hgb urine dipstick NEGATIVE NEGATIVE   Bilirubin Urine NEGATIVE NEGATIVE   Ketones, ur NEGATIVE NEGATIVE mg/dL   Protein, ur NEGATIVE NEGATIVE mg/dL   Nitrite NEGATIVE NEGATIVE   Leukocytes,Ua NEGATIVE NEGATIVE    Comment: Performed at [facility], [address] [place], [place] [address]  CBC     Status: Abnormal   Collection Time: 10/09/19  2:42 PM  Result Value Ref Range   WBC 10.8 (H) 4.0 - 10.5 K/uL   RBC 5.01 3.87 - 5.11 MIL/uL   Hemoglobin 15.2 (H) 12.0 - 15.0 g/dL   HCT 45.7 36 - 46 %   MCV 91.2 80.0 - 100.0 fL   MCH 30.3 26.0 - 34.0 pg   MCHC 33.3 30.0 - 36.0 g/dL   RDW 12.5 11.5 - 15.5 %   Platelets 402 (H) 150 - 400 K/uL   nRBC 0.0 0.0 - 0.2 %    Comment: Performed at [facility], [address], [place],  [address]  Comprehensive metabolic panel     Status: Abnormal   Collection Time: 10/09/19  2:42 PM  Result Value Ref Range   Sodium 136 135 - 145 mmol/L   Potassium 4.0 3.5 - 5.1 mmol/L   Chloride 101 98 - 111 mmol/L   CO2 25 22 - 32 mmol/L   Glucose, Bld 123 (H) 70 - 99 mg/dL    Comment: Glucose reference range applies only to samples taken after fasting for at least 8 hours.   BUN 15 6 - 20 mg/dL  Creatinine, Ser 0.81 0.44 - 1.00 mg/dL   Calcium 9.6 8.9 - 10.3 mg/dL   Total Protein 7.6 6.5 - 8.1 g/dL   Albumin 4.5 3.5  - 5.0 g/dL   AST 14 (L) 15 - 41 U/L   ALT 14 0 - 44 U/L   Alkaline Phosphatase 57 38 - 126 U/L   Total Bilirubin 0.6 0.3 - 1.2 mg/dL   GFR calc non Af Amer >60 >60 mL/min   GFR calc Af Amer >60 >60 mL/min   Anion gap 10 5 - 15    Comment: Performed at [facility], [address], [place], [place] [address]  Type and screen     Status: None   Collection Time: 10/09/19  2:42 PM  Result Value Ref Range   ABO/RH(D) O POS    Antibody Screen NEG    Sample Expiration 10/23/2019,2359    Extend sample reason      NO TRANSFUSIONS OR PREGNANCY IN THE PAST 3 MONTHS Performed at [facility], [address] [place], [place] [address]   Surgical pathology     Status: None   Collection Time: 10/12/19  8:06 AM  Result Value Ref Range   SURGICAL PATHOLOGY      SURGICAL PATHOLOGY CASE: [id number] PATIENT: Lanice Schwab Surgical Pathology Report     Clinical History: LLQ pain     FINAL MICROSCOPIC DIAGNOSIS:  A. LEFT OVARY, LEFT OOPHORECTOMY: - Corpora albicantia, inclusion cysts and foreign body-type giant cell response to polarizable material present focally; benign.   GROSS DESCRIPTION:  Received fresh is a 2.5 x 1.7 x 1.0 cm intact ovary.  The cut surface is tan-white to pale yellow.  There are no discrete masses.  Sectioned and entirely submitted in 4 cassettes.  [facility] 10/12/2019)   Final Diagnosis performed by Gillie Manners, MD.   Electronically signed 10/13/2019 Technical component performed at [facility], [place] [address] [place], [place] [address].  Professional component performed at [employer]. [facility], [place] [address], [place], [place] [address].  Immunohistochemistry Technical component (if applicable) was performed at [facility]. [address] [place], [place], [place],  [address].   IMMUNOHISTOCHEMISTRY DISCLAIMER (if applicable): Some of these immunohistochemical stains may have been developed and  the performance characteristics determine by [facility]. Some may not have been cleared or approved by the U.S. Food and Drug Administration. The FDA has determined that such clearance or approval is not necessary. This test is used for clinical purposes. It should not be regarded as investigational or for research. This laboratory is certified under the [place] (CLIA-88) as qualified to perform high complexity clinical laboratory testing.  The controls stained appropriately.   TSH     Status: Abnormal   Collection Time: 11/27/19 11:32 AM  Result Value Ref Range   TSH 0.06 (L) mIU/L    Comment:           Reference Range .           > or = 20 Years  0.40-4.50 .                Pregnancy Ranges           First trimester    0.26-2.66           Second trimester   0.55-2.73           Third trimester    0.43-2.91   T4, free  Status: None   Collection Time: 11/27/19 11:32 AM  Result Value Ref Range   Free T4 1.8 0.8 - 1.8 ng/dL    Assessment & Plan:   1. Hypothyroidism 2.  Class 1 obesity -Her previsit labs show slight over replacement.  I discussed and lowered her levothyroxine to 112 mcg p.o. daily before breakfast .   - We discussed about the correct intake of her thyroid hormone, on empty stomach at fasting, with water, separated by at least 30 minutes from breakfast and other medications,  and separated by more than 4 hours from calcium, iron, multivitamins, acid reflux medications (PPIs). -Patient is made aware of the fact that thyroid hormone replacement is needed for life, dose to be adjusted by periodic monitoring of thyroid function tests.  Regarding her weight concern: - she  admits there is a room for improvement in her diet and drink choices. -  Suggestion is made for her to avoid simple carbohydrates  from her diet including Cakes, Sweet Desserts / Pastries, Ice Cream, Soda (diet and regular), Sweet Tea, Candies,  Chips, Cookies, Sweet Pastries,  Store Bought Juices, Alcohol in Excess of  1-2 drinks a day, Artificial Sweeteners, Coffee Creamer, and "Sugar-free" Products. This will help patient to have stable blood glucose profile and potentially avoid unintended weight gain. She is interested in consulting with a dietitian.   - I advised patient to maintain close follow up with Redmond School, MD for primary care needs.    - Time spent on this patient care encounter:  20 minutes of which 50% was spent in  counseling and the rest reviewing  her current and  previous labs / studies and medications  doses and developing a plan for long term care. Vanessa Bowen  participated in the discussions, expressed understanding, and voiced agreement with the above plans.  All questions were answered to her satisfaction. she is encouraged to contact clinic should she have any questions or concerns prior to her return visit.   Follow up plan: Return in about 3 months (around 03/08/2020) for F/U with Pre-visit Labs, [place] A1c in Office.  Glade Lloyd, MD Phone: [phone]  Fax: [phone]  -  This note was partially dictated with voice recognition software. Similar sounding words can be transcribed inadequately or may not  be corrected upon review.  12/07/2019, 4:37 PM

## 2019-12-07 NOTE — Patient Instructions (Signed)

## 2019-12-11 ENCOUNTER — Telehealth: Payer: Self-pay | Admitting: Orthopaedic Surgery

## 2019-12-11 MED ORDER — HYDROCODONE-ACETAMINOPHEN 5-325 MG PO TABS: ORAL_TABLET | ORAL | 0 refills | Status: DC

## 2019-12-11 NOTE — Telephone Encounter (Signed)
Patient called for refill:  HYDROcodone-acetaminophen (NORCO/VICODIN) 5-325 MG tablet 30 tablet  -[facility] in [place]

## 2019-12-28 ENCOUNTER — Ambulatory Visit: Payer: 59 | Admitting: Orthopaedic Surgery

## 2020-01-02 ENCOUNTER — Ambulatory Visit (INDEPENDENT_AMBULATORY_CARE_PROVIDER_SITE_OTHER): Payer: 59 | Admitting: Orthopaedic Surgery

## 2020-01-02 ENCOUNTER — Encounter: Payer: Self-pay | Admitting: Orthopaedic Surgery

## 2020-01-02 ENCOUNTER — Other Ambulatory Visit: Payer: Self-pay

## 2020-01-02 VITALS — Ht 63.0 in | Wt 172.0 lb

## 2020-01-02 DIAGNOSIS — M545 Low back pain: Secondary | ICD-10-CM | POA: Diagnosis not present

## 2020-01-02 DIAGNOSIS — F17210 Nicotine dependence, cigarettes, uncomplicated: Secondary | ICD-10-CM | POA: Diagnosis not present

## 2020-01-02 DIAGNOSIS — G894 Chronic pain syndrome: Secondary | ICD-10-CM | POA: Diagnosis not present

## 2020-01-02 DIAGNOSIS — G8929 Other chronic pain: Secondary | ICD-10-CM | POA: Diagnosis not present

## 2020-01-02 NOTE — Progress Notes (Signed)
Patient IN:OMVEH Vanessa Bowen, female DOB:[birth year], 44 y.o. MCN:[id number]  Chief Complaint  Patient presents with  . Back Pain    HPI  Vanessa Bowen is a 44 y.o. female who has chronic pain of the lumbar spine and chronic pain syndrome.  She has pain with activity and weather changes.  She has no numbness. She is doing her exercises and her rubs.  She has no new trauma.  She is taking her medicine.   Body mass index is 30.47 kg/m.  ROS  Review of Systems  HENT: Negative for congestion.   Respiratory: Negative for cough and shortness of breath.   Cardiovascular: Negative for chest pain and leg swelling.  Endocrine: Positive for cold intolerance.  Musculoskeletal: Positive for arthralgias and back pain.  Allergic/Immunologic: Positive for environmental allergies.  Neurological: Positive for headaches.  Psychiatric/Behavioral: The patient is nervous/anxious.   All other systems reviewed and are negative.   All other systems reviewed and are negative.  The following is a summary of the past history medically, past history surgically, known current medicines, social history and family history.  This information is gathered electronically by the computer from prior information and documentation.  I review this each visit and have found including this information at this point in the chart is beneficial and informative.    Past Medical History:  Diagnosis Date  . Anxiety   . Arthritis   . Chronic back pain   . Colitis, ulcerative ([place])   . Depression   . Hypothyroidism   . Migraine     Past Surgical History:  Procedure Laterality Date  . ANKLE SURGERY    . BREAST ENHANCEMENT SURGERY    . CESAREAN SECTION    . KNEE SURGERY Right    arthroscopy  . LAPAROSCOPIC BILATERAL SALPINGECTOMY  07/27/2012   Procedure: LAPAROSCOPIC BILATERAL SALPINGECTOMY;  Surgeon: Florian Buff, MD;  Location: [facility];  Service: Gynecology;  Laterality: N/A;  . OOPHORECTOMY Right 09/09/2016   Procedure:  RIGHT OOPHORECTOMY;  Surgeon: Florian Buff, MD;  Location: [facility];  Service: Gynecology;  Laterality: Right;  . OVARIAN CYST REMOVAL    . VAGINAL HYSTERECTOMY N/A 09/09/2016   Procedure: HYSTERECTOMY VAGINAL;  Surgeon: Florian Buff, MD;  Location: [facility];  Service: Gynecology;  Laterality: N/A;    Family History  Problem Relation Age of Onset  . Cancer Maternal Grandmother   . Cancer Maternal Grandfather   . Alcohol abuse Maternal Grandfather   . Hypothyroidism Daughter   . Bipolar disorder Daughter   . Anxiety disorder Daughter   . Depression Mother   . Alcohol abuse Mother   . Drug abuse Sister   . Anxiety disorder Sister     Social History Social History   Tobacco Use  . Smoking status: Current Every Day Smoker    Packs/day: 0.50    Years: 15.00    Pack years: 7.50    Types: Cigarettes  . Smokeless tobacco: Never Used  Vaping Use  . Vaping Use: Never used  Substance Use Topics  . Alcohol use: Yes    Comment: Occasionally  . Drug use: No    Allergies  Allergen Reactions  . Doxycycline Nausea And Vomiting  . Sulfonamide Derivatives Nausea And Vomiting    Current Outpatient Medications  Medication Sig Dispense Refill  . ALPRAZolam (XANAX) 1 MG tablet TAKE 1 TABLET BY MOUTH 3 TIMES DAILY AS NEEDED FOR ANXIETY. 90 tablet 1  . conjugated estrogens (PREMARIN) vaginal cream USE 1  GRAM NIGHTLY AS DIRECTED (Patient taking differently: Place 1 Applicatorful vaginally 2 (two) times a week. At bedtime`) 90 g 4  . DIVIGEL 1 MG/GM GEL PLACE 1 PACKET ONTO THE SKIN DAILY. (Patient taking differently: Apply 1 packet topically at bedtime. ) 1 g 3  . DULoxetine (CYMBALTA) 60 MG capsule Take 1 capsule (60 mg total) by mouth 2 (two) times daily. 180 capsule 1  . HYDROcodone-acetaminophen (NORCO/VICODIN) 5-325 MG tablet One tablet by mouth every six hours as needed for pain. 30 days between refills. 30 tablet 0  . ibuprofen (ADVIL,MOTRIN) 200 MG tablet Take 400-600 mg by mouth every 8  (eight) hours as needed (for pain/headaches.).    Marland Kitchen levothyroxine (SYNTHROID) 112 MCG tablet Take 1 tablet (112 mcg total) by mouth daily before breakfast. 90 tablet 1  . tiZANidine (ZANAFLEX) 4 MG tablet Take 4 mg by mouth every 6 (six) hours as needed for muscle spasms.     . traZODone (DESYREL) 100 MG tablet Take 2 tablets (200 mg total) by mouth at bedtime. 180 tablet 1   No current facility-administered medications for this visit.     Physical Exam  Height 5' 3"  (1.6 m), weight 172 lb (78 kg), last menstrual period 07/24/2016.  Constitutional: overall normal hygiene, normal nutrition, well developed, normal grooming, normal body habitus. Assistive device: none  Musculoskeletal: gait and station Limp none, muscle tone and strength are normal, no tremors or atrophy is present.  .  Neurological: coordination overall normal.  Deep tendon reflex/nerve stretch intact.  Sensation normal.  Cranial nerves II-XII intact.   Skin:   Normal overall no scars, lesions, ulcers or rashes. No psoriasis.  Psychiatric: Alert and oriented x 3.  Recent memory intact, remote memory unclear.  Normal mood and affect. Well groomed.  Good eye contact.  Cardiovascular: overall no swelling, no varicosities, no edema bilaterally, normal temperatures of the legs and arms, no clubbing, cyanosis and good capillary refill.  Lymphatic: palpation is normal.  Spine/Pelvis examination:  Inspection:  Overall, sacoiliac joint benign and hips nontender; without crepitus or defects.   Thoracic spine inspection: Alignment normal without kyphosis present   Lumbar spine inspection:  Alignment  with normal lumbar lordosis, without scoliosis apparent.   Thoracic spine palpation:  without tenderness of spinal processes   Lumbar spine palpation: without tenderness of lumbar area; without tightness of lumbar muscles    Range of Motion:   Lumbar flexion, forward flexion is normal without pain or tenderness    Lumbar  extension is full without pain or tenderness   Left lateral bend is normal without pain or tenderness   Right lateral bend is normal without pain or tenderness   Straight leg raising is normal  Strength & tone: normal   Stability overall normal stability  All other systems reviewed and are negative   The patient has been educated about the nature of the problem(s) and counseled on treatment options.  The patient appeared to understand what I have discussed and is in agreement with it.  Encounter Diagnoses  Name Primary?  . Chronic midline low back pain without sciatica Yes  . Chronic pain syndrome   . Cigarette nicotine dependence without complication     PLAN Call if any problems.  Precautions discussed.  Continue current medications.   Return to clinic 3 months   Electronically Signed Sanjuana Kava, MD 7/13/20212:51 PM

## 2020-01-08 ENCOUNTER — Other Ambulatory Visit: Payer: Self-pay | Admitting: Orthopaedic Surgery

## 2020-01-08 MED ORDER — HYDROCODONE-ACETAMINOPHEN 5-325 MG PO TABS: ORAL_TABLET | ORAL | 0 refills | Status: DC

## 2020-01-08 NOTE — Telephone Encounter (Signed)
Vanessa Bowen and you discussed her amount of tablets being increased to 60 on her refill

## 2020-01-16 ENCOUNTER — Ambulatory Visit: Payer: 59 | Admitting: "Endocrinology

## 2020-01-29 ENCOUNTER — Ambulatory Visit: Payer: 59 | Admitting: Nutrition

## 2020-01-30 ENCOUNTER — Telehealth (INDEPENDENT_AMBULATORY_CARE_PROVIDER_SITE_OTHER): Payer: 59 | Admitting: Psychiatry

## 2020-01-30 ENCOUNTER — Other Ambulatory Visit: Payer: Self-pay

## 2020-01-30 DIAGNOSIS — F411 Generalized anxiety disorder: Secondary | ICD-10-CM

## 2020-01-30 DIAGNOSIS — F3341 Major depressive disorder, recurrent, in partial remission: Secondary | ICD-10-CM

## 2020-01-30 DIAGNOSIS — F410 Panic disorder [episodic paroxysmal anxiety] without agoraphobia: Secondary | ICD-10-CM

## 2020-01-30 MED ORDER — ALPRAZOLAM 1 MG PO TABS: 1.0000 mg | ORAL_TABLET | Freq: Four times a day (QID) | ORAL | 2 refills | Status: AC | PRN

## 2020-01-30 MED ORDER — TRAZODONE HCL 150 MG PO TABS: 300.0000 mg | ORAL_TABLET | Freq: Every day | ORAL | 1 refills | Status: DC

## 2020-01-30 NOTE — Progress Notes (Signed)
BH MD/PA/NP OP Progress Note  01/30/2020 10:58 AM Vanessa Bowen  MRN:  [id number] Interview was conducted by phone and I verified that I was speaking with the correct person using two identifiers. I discussed the limitations of evaluation and management by telemedicine and  the availability of in person appointments. Patient expressed understanding and agreed to proceed. 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  Chief Complaint: Anxiety.  HPI: 44yo married female with panic disorder, GAD and MDD.She has beendepressed,anxious and having more problems with sleep due to stress related toCOVID. Shereportedthat alprazolamworks better than clonazepamdidso we changed it back.We increased duloxetine and trazodone doses. Crissa now reports feelingless depressedand less anxious now.She is back at work and is still deciding whether to get vaccinated. Sleep adequate.Alprazolam used as neededbut she takes it daily.Trazodone taken every night with good effect but she needs to take 3 100 mg tabs to sleep well.No SI, normal appetite, no psychosis.   Visit Diagnosis:    ICD-10-CM   1. Panic disorder  F41.0   2. GAD (generalized anxiety disorder)  F41.1   3. Major depressive disorder, recurrent episode, in partial remission (HCC)  F33.41     Past Psychiatric History: Please see intake H&P.  Past Medical History:  Past Medical History:  Diagnosis Date  . Anxiety   . Arthritis   . Chronic back pain   . Colitis, ulcerative ([place])   . Depression   . Hypothyroidism   . Migraine     Past Surgical History:  Procedure Laterality Date  . ANKLE SURGERY    . BREAST ENHANCEMENT SURGERY    . CESAREAN SECTION    . KNEE SURGERY Right    arthroscopy  . LAPAROSCOPIC BILATERAL SALPINGECTOMY  07/27/2012   Procedure: LAPAROSCOPIC BILATERAL SALPINGECTOMY;  Surgeon: Florian Buff, MD;  Location: [facility];  Service: Gynecology;  Laterality: N/A;  . OOPHORECTOMY Right 09/09/2016   Procedure:  RIGHT OOPHORECTOMY;  Surgeon: Florian Buff, MD;  Location: [facility];  Service: Gynecology;  Laterality: Right;  . OVARIAN CYST REMOVAL    . VAGINAL HYSTERECTOMY N/A 09/09/2016   Procedure: HYSTERECTOMY VAGINAL;  Surgeon: Florian Buff, MD;  Location: [facility];  Service: Gynecology;  Laterality: N/A;    Family Psychiatric History: Reviewed.  Family History:  Family History  Problem Relation Age of Onset  . Cancer Maternal Grandmother   . Cancer Maternal Grandfather   . Alcohol abuse Maternal Grandfather   . Hypothyroidism Daughter   . Bipolar disorder Daughter   . Anxiety disorder Daughter   . Depression Mother   . Alcohol abuse Mother   . Drug abuse Sister   . Anxiety disorder Sister     Social History:  Social History   Socioeconomic History  . Marital status: Legally Separated    Spouse name: Not on file  . Number of children: 2  . Years of education: Not on file  . Highest education level: Not on file  Occupational History  . Not on file  Tobacco Use  . Smoking status: Current Every Day Smoker    Packs/day: 0.50    Years: 15.00    Pack years: 7.50    Types: Cigarettes  . Smokeless tobacco: Never Used  Vaping Use  . Vaping Use: Never used  Substance and Sexual Activity  . Alcohol use: Yes    Comment: Occasionally  . Drug use: No  . Sexual activity: Not Currently    Birth control/protection: Surgical    Comment: hyst  Other Topics Concern  . Not on file  Social History Narrative  . Not on file   Social Determinants of Health   Financial Resource Strain:   . Difficulty of Paying Living Expenses:   Food Insecurity:   . Worried About Charity fundraiser in the Last Year:   . Arboriculturist in the Last Year:   Transportation Needs:   . Film/video editor (Medical):   Marland Kitchen Lack of Transportation (Non-Medical):   Physical Activity:   . Days of Exercise per Week:   . Minutes of Exercise per Session:   Stress:   . Feeling of Stress :   Social Connections:    . Frequency of Communication with Friends and Family:   . Frequency of Social Gatherings with Friends and Family:   . Attends Religious Services:   . Active Member of Clubs or Organizations:   . Attends Archivist Meetings:   Marland Kitchen Marital Status:     Allergies:  Allergies  Allergen Reactions  . Doxycycline Nausea And Vomiting  . Sulfonamide Derivatives Nausea And Vomiting    Metabolic Disorder Labs: Lab Results  Component Value Date   HGBA1C 5.2 04/27/2017   MPG 103 04/27/2017   MPG 100 07/21/2016   No results found for: PROLACTIN No results found for: CHOL, TRIG, HDL, CHOLHDL, VLDL, LDLCALC Lab Results  Component Value Date   TSH 0.06 (L) 11/27/2019   TSH 0.11 (L) 08/22/2019    Therapeutic Level Labs: No results found for: LITHIUM No results found for: VALPROATE No components found for:  CBMZ  Current Medications: Current Outpatient Medications  Medication Sig Dispense Refill  . ALPRAZolam (XANAX) 1 MG tablet Take 1 tablet (1 mg total) by mouth 4 (four) times daily as needed for anxiety. 120 tablet 2  . conjugated estrogens (PREMARIN) vaginal cream USE 1 GRAM NIGHTLY AS DIRECTED (Patient taking differently: Place 1 Applicatorful vaginally 2 (two) times a week. At bedtime`) 90 g 4  . DIVIGEL 1 MG/GM GEL PLACE 1 PACKET ONTO THE SKIN DAILY. (Patient taking differently: Apply 1 packet topically at bedtime. ) 1 g 3  . DULoxetine (CYMBALTA) 60 MG capsule Take 1 capsule (60 mg total) by mouth 2 (two) times daily. 180 capsule 1  . HYDROcodone-acetaminophen (NORCO/VICODIN) 5-325 MG tablet One tablet by mouth every six hours as needed for pain. 30 days between refills. 60 tablet 0  . ibuprofen (ADVIL,MOTRIN) 200 MG tablet Take 400-600 mg by mouth every 8 (eight) hours as needed (for pain/headaches.).    Marland Kitchen levothyroxine (SYNTHROID) 112 MCG tablet Take 1 tablet (112 mcg total) by mouth daily before breakfast. 90 tablet 1  . tiZANidine (ZANAFLEX) 4 MG tablet Take 4 mg by  mouth every 6 (six) hours as needed for muscle spasms.     . traZODone (DESYREL) 150 MG tablet Take 2 tablets (300 mg total) by mouth at bedtime. 180 tablet 1   No current facility-administered medications for this visit.     Psychiatric Specialty Exam: Review of Systems  Psychiatric/Behavioral: The patient is nervous/anxious.   All other systems reviewed and are negative.   Last menstrual period 07/24/2016.There is no height or weight on file to calculate BMI.  General Appearance: NA  Eye Contact:  NA  Speech:  Clear and Coherent and Normal Rate  Volume:  Normal  Mood:  Anxious  Affect:  NA  Thought Process:  Goal Directed and Linear  Orientation:  Full (Time, Place, and Person)  Thought Content:  Rumination   Suicidal Thoughts:  No  Homicidal Thoughts:  No  Memory:  Immediate;   Good Recent;   Good Remote;   Good  Judgement:  Good  Insight:  Good  Psychomotor Activity:  NA  Concentration:  Concentration: Fair  Recall:  Good  Fund of Knowledge: Good  Language: Good  Akathisia:  Negative  Handed:  Right  AIMS (if indicated): not done  Assets:  Communication Skills Desire for Improvement Financial Resources/Insurance Housing Resilience Social Support Talents/Skills  ADL's:  Intact  Cognition: WNL  Sleep:  Fair   Screenings: PHQ2-9     Office Visit from 09/07/2019 in [facility] Office Visit from 03/18/2016 in [facility]  PHQ-2 Total Score 0 0       Assessment and Plan: 44yo married female with panic disorder, GAD and MDD.She has beendepressed,anxious and having more problems with sleep due to stress related toCOVID. Shereportedthat alprazolamworks better than clonazepamdidso we changed it back.We increased duloxetine and trazodone doses. Sabriyah now reports feelingless depressedand less anxious now.She is back at work and is still deciding whether to get vaccinated. Sleep adequate.Alprazolam used as neededbut she  takes it daily.Trazodone taken every night with good effect but she needs to take 3 100 mg tabs to sleep well.No SI, normal appetite, no psychosis.  Dx: Panic disorder; GAD; MDD recurrent in partial remission  Plan:Continue duloxetine 60 mg bid,alprazolam 1 mg qid prn anxiety and trazodone 300 mg at HS for sleep.The plan was discussed with patient who had an opportunity to ask questions and these were all answered. I spend71mnutes inphone contact with patient. Next appointment in379month    OlStephanie AcreMD 01/30/2020, 10:58 AM

## 2020-02-06 ENCOUNTER — Other Ambulatory Visit: Payer: Self-pay | Admitting: Orthopaedic Surgery

## 2020-02-06 MED ORDER — HYDROCODONE-ACETAMINOPHEN 5-325 MG PO TABS: ORAL_TABLET | ORAL | 0 refills | Status: DC

## 2020-02-28 LAB — TSH: TSH: 2.92 mIU/L

## 2020-02-28 LAB — T4, FREE: Free T4: 1.3 ng/dL (ref 0.8–1.8)

## 2020-03-04 ENCOUNTER — Telehealth (INDEPENDENT_AMBULATORY_CARE_PROVIDER_SITE_OTHER): Payer: Self-pay | Admitting: Nurse Practitioner

## 2020-03-04 ENCOUNTER — Encounter: Payer: Self-pay | Admitting: Nurse Practitioner

## 2020-03-04 ENCOUNTER — Other Ambulatory Visit: Payer: Self-pay

## 2020-03-04 DIAGNOSIS — E038 Other specified hypothyroidism: Secondary | ICD-10-CM

## 2020-03-04 MED ORDER — LEVOTHYROXINE SODIUM 112 MCG PO TABS: 112.0000 ug | ORAL_TABLET | Freq: Every day | ORAL | 1 refills | Status: DC

## 2020-03-04 NOTE — Progress Notes (Signed)
03/04/2020        Endocrinology follow-up note   TELEHEALTH VISIT: The patient is being engaged in telehealth visit due to COVID-19.  This type of visit limits physical examination significantly, and thus is not preferable over face-to-face encounters.  I connected with  Vanessa Bowen on 03/04/20 by a video enabled telemedicine application and verified that I am speaking with the correct person using two identifiers.   I discussed the limitations of evaluation and management by telemedicine. The patient expressed understanding and agreed to proceed.    The participants involved in this visit include: Brita Romp, NP located at [facility] and Vanessa Bowen  located at their personal residence listed.  Subjective:    Patient ID: Vanessa Bowen, female    DOB: Feb 28, 1976, PCP Redmond School, MD   Past Medical History:  Diagnosis Date  . Anxiety   . Arthritis   . Chronic back pain   . Colitis, ulcerative ([place])   . Depression   . Hypothyroidism   . Migraine    Past Surgical History:  Procedure Laterality Date  . ANKLE SURGERY    . BREAST ENHANCEMENT SURGERY    . CESAREAN SECTION    . KNEE SURGERY Right    arthroscopy  . LAPAROSCOPIC BILATERAL SALPINGECTOMY  07/27/2012   Procedure: LAPAROSCOPIC BILATERAL SALPINGECTOMY;  Surgeon: Florian Buff, MD;  Location: [facility];  Service: Gynecology;  Laterality: N/A;  . OOPHORECTOMY Right 09/09/2016   Procedure: RIGHT OOPHORECTOMY;  Surgeon: Florian Buff, MD;  Location: [facility];  Service: Gynecology;  Laterality: Right;  . OVARIAN CYST REMOVAL    . VAGINAL HYSTERECTOMY N/A 09/09/2016   Procedure: HYSTERECTOMY VAGINAL;  Surgeon: Florian Buff, MD;  Location: [facility];  Service: Gynecology;  Laterality: N/A;   Social History   Socioeconomic History  . Marital status: Legally Separated    Spouse name: Not on file  . Number of children: 2  . Years of education: Not on file  . Highest education level: Not  on file  Occupational History  . Not on file  Tobacco Use  . Smoking status: Current Every Day Smoker    Packs/day: 0.50    Years: 15.00    Pack years: 7.50    Types: Cigarettes  . Smokeless tobacco: Never Used  Vaping Use  . Vaping Use: Never used  Substance and Sexual Activity  . Alcohol use: Yes    Comment: Occasionally  . Drug use: No  . Sexual activity: Not Currently    Birth control/protection: Surgical    Comment: hyst  Other Topics Concern  . Not on file  Social History Narrative  . Not on file   Social Determinants of Health   Financial Resource Strain:   . Difficulty of Paying Living Expenses: Not on file  Food Insecurity:   . Worried About Charity fundraiser in the Last Year: Not on file  . Ran Out of Food in the Last Year: Not on file  Transportation Needs:   . Lack of Transportation (Medical): Not on file  . Lack of Transportation (Non-Medical): Not on file  Physical Activity:   . Days of Exercise per Week: Not on file  . Minutes of Exercise per Session: Not on file  Stress:   . Feeling of Stress : Not on file  Social Connections:   . Frequency of Communication with Friends and Family: Not on file  . Frequency of  Social Gatherings with Friends and Family: Not on file  . Attends Religious Services: Not on file  . Active Member of Clubs or Organizations: Not on file  . Attends Archivist Meetings: Not on file  . Marital Status: Not on file   Outpatient Encounter Medications as of 03/04/2020  Medication Sig  . ALPRAZolam (XANAX) 1 MG tablet Take 1 tablet (1 mg total) by mouth 4 (four) times daily as needed for anxiety.  . conjugated estrogens (PREMARIN) vaginal cream USE 1 GRAM NIGHTLY AS DIRECTED (Patient taking differently: Place 1 Applicatorful vaginally 2 (two) times a week. At bedtime`)  . DIVIGEL 1 MG/GM GEL PLACE 1 PACKET ONTO THE SKIN DAILY. (Patient taking differently: Apply 1 packet topically at bedtime. )  . DULoxetine (CYMBALTA) 60  MG capsule Take 1 capsule (60 mg total) by mouth 2 (two) times daily.  Marland Kitchen HYDROcodone-acetaminophen (NORCO/VICODIN) 5-325 MG tablet One tablet by mouth every six hours as needed for pain. 30 days between refills.  Marland Kitchen ibuprofen (ADVIL,MOTRIN) 200 MG tablet Take 400-600 mg by mouth every 8 (eight) hours as needed (for pain/headaches.).  Marland Kitchen levothyroxine (SYNTHROID) 112 MCG tablet Take 1 tablet (112 mcg total) by mouth daily before breakfast.  . tiZANidine (ZANAFLEX) 4 MG tablet Take 4 mg by mouth every 6 (six) hours as needed for muscle spasms.   . traZODone (DESYREL) 150 MG tablet Take 2 tablets (300 mg total) by mouth at bedtime.  . [DISCONTINUED] levothyroxine (SYNTHROID) 112 MCG tablet Take 1 tablet (112 mcg total) by mouth daily before breakfast.  . [DISCONTINUED] dicyclomine (BENTYL) 20 MG tablet Take 1 tablet (20 mg total) by mouth 3 (three) times daily as needed for up to 30 days for spasms.   No facility-administered encounter medications on file as of 03/04/2020.   ALLERGIES: Allergies  Allergen Reactions  . Doxycycline Nausea And Vomiting  . Sulfonamide Derivatives Nausea And Vomiting   VACCINATION STATUS:  There is no immunization history on file for this patient.  Thyroid Problem Presents for follow-up visit. Symptoms include anxiety, constipation and weight loss. Patient reports no cold intolerance, diarrhea, fatigue, heat intolerance, palpitations or tremors. (Wt loss is intentional- she has changed her diet and increased her exercise.) The symptoms have been stable.    44 yr old female with medical history.  She is returning with repeat thyroid function test for follow-up of longstanding hypothyroidism.    She has hypothyroidism diagnosed at approximate age of 20 years.  She is currently on levothyroxine 112 mcg p.o. every morning.  She reports better consistency taking her medication at this time.  She has no new complaints today.   She denies palpitations, tremors, nor  heat/cold intolerance.  Her previsit labs are consistent with over replacement.  Review of Systems  Constitutional: Positive for weight loss. Negative for fatigue.  Cardiovascular: Negative for palpitations.  Gastrointestinal: Positive for constipation. Negative for diarrhea.  Endocrine: Negative for cold intolerance and heat intolerance.  Neurological: Negative for tremors.  Psychiatric/Behavioral: The patient is nervous/anxious.        Objective:    LMP 07/24/2016   Wt Readings from Last 3 Encounters:  01/02/20 172 lb (78 kg)  12/07/19 172 lb 3.2 oz (78.1 kg)  10/25/19 169 lb 3.2 oz (76.7 kg)     Physical Exam- Telehealth- significantly limited due to nature of visit  Constitutional: There is no height or weight on file to calculate BMI. , not in acute distress, normal state of mind Respiratory: Adequate breathing efforts  CMP     Component Value Date/Time   NA 136 10/09/2019 1442   K 4.0 10/09/2019 1442   CL 101 10/09/2019 1442   CO2 25 10/09/2019 1442   GLUCOSE 123 (H) 10/09/2019 1442   BUN 15 10/09/2019 1442   CREATININE 0.81 10/09/2019 1442   CREATININE 1.05 04/27/2017 1226   CALCIUM 9.6 10/09/2019 1442   PROT 7.6 10/09/2019 1442   ALBUMIN 4.5 10/09/2019 1442   AST 14 (L) 10/09/2019 1442   ALT 14 10/09/2019 1442   ALKPHOS 57 10/09/2019 1442   BILITOT 0.6 10/09/2019 1442   GFRNONAA >60 10/09/2019 1442   GFRAA >60 10/09/2019 1442   Recent Results (from the past 2160 hour(s))  TSH     Status: None   Collection Time: 02/27/20 10:15 AM  Result Value Ref Range   TSH 2.92 mIU/L    Comment:           Reference Range .           > or = 20 Years  0.40-4.50 .                Pregnancy Ranges           First trimester    0.26-2.66           Second trimester   0.55-2.73           Third trimester    0.43-2.91   T4, free     Status: None   Collection Time: 02/27/20 10:15 AM  Result Value Ref Range   Free T4 1.3 0.8 - 1.8 ng/dL    Assessment & Plan:   1.  Hypothyroidism  -Her previsit labs show appropriate hormone replacement.  She is advised to continue current dose of levothyroxine 112 mcg po daily before breakfast.   - We discussed about the correct intake of her thyroid hormone, on empty stomach at fasting, with water, separated by at least 30 minutes from breakfast and other medications,  and separated by more than 4 hours from calcium, iron, multivitamins, acid reflux medications (PPIs). -Patient is made aware of the fact that thyroid hormone replacement is needed for life, dose to be adjusted by periodic monitoring of thyroid function tests.  - I advised patient to maintain close follow up with Redmond School, MD for primary care needs.  I spent 20 minutes dedicated to the care of this patient on the date of this encounter to include pre-visit review of records, face-to-face time with the patient, and post visit ordering of  testing.  Vanessa Bowen  participated in the discussions, expressed understanding, and voiced agreement with the above plans.  All questions were answered to her satisfaction. she is encouraged to contact clinic should she have any questions or concerns prior to her return visit.   Follow up plan: Return in about 4 months (around 07/04/2020) for Thyroid follow up, Previsit labs, Virtual visit ok.  Rayetta Pigg, FNP-BC [facility] Phone: [phone] Fax: [phone]  03/04/2020, 8:54 AM

## 2020-03-04 NOTE — Patient Instructions (Signed)
-   The correct intake of thyroid hormone (Levothyroxine, Synthroid), is on empty stomach first thing in the morning, with water, separated by at least 30 minutes from breakfast and other medications,  and separated by more than 4 hours from calcium, iron, multivitamins, acid reflux medications (PPIs).  - This medication is a life-long medication and will be needed to correct thyroid hormone imbalances for the rest of your life.  The dose may change from time to time, based on thyroid blood work.  - It is extremely important to be consistent taking this medication, near the same time each morning.

## 2020-03-06 ENCOUNTER — Other Ambulatory Visit: Payer: Self-pay | Admitting: Orthopaedic Surgery

## 2020-03-06 MED ORDER — HYDROCODONE-ACETAMINOPHEN 5-325 MG PO TABS: ORAL_TABLET | ORAL | 0 refills | Status: DC

## 2020-03-08 ENCOUNTER — Ambulatory Visit: Payer: 59 | Admitting: "Endocrinology

## 2020-04-02 ENCOUNTER — Encounter: Payer: Self-pay | Admitting: Orthopaedic Surgery

## 2020-04-02 ENCOUNTER — Ambulatory Visit (INDEPENDENT_AMBULATORY_CARE_PROVIDER_SITE_OTHER): Payer: 59 | Admitting: Orthopaedic Surgery

## 2020-04-02 ENCOUNTER — Other Ambulatory Visit: Payer: Self-pay

## 2020-04-02 DIAGNOSIS — G8929 Other chronic pain: Secondary | ICD-10-CM | POA: Diagnosis not present

## 2020-04-02 DIAGNOSIS — F17210 Nicotine dependence, cigarettes, uncomplicated: Secondary | ICD-10-CM | POA: Diagnosis not present

## 2020-04-02 DIAGNOSIS — G894 Chronic pain syndrome: Secondary | ICD-10-CM | POA: Diagnosis not present

## 2020-04-02 DIAGNOSIS — M545 Low back pain, unspecified: Secondary | ICD-10-CM

## 2020-04-02 MED ORDER — HYDROCODONE-ACETAMINOPHEN 5-325 MG PO TABS: ORAL_TABLET | ORAL | 0 refills | Status: DC

## 2020-04-02 NOTE — Progress Notes (Signed)
Virtual Visit via Telephone Note  I connected with@ on 04/02/20 at  3:30 PM EDT by telephone and verified that I am speaking with the correct person using two identifiers.  Location: Patient: home Provider: office   I discussed the limitations, risks, security and privacy concerns of performing an evaluation and management service by telephone and the availability of in person appointments. I also discussed with the patient that there may be a patient responsible charge related to this service. The patient expressed understanding and agreed to proceed.   History of Present Illness: She has chronic pain of the lower back, she is stable.  She has pain in the right hip and leg.  She has no new trauma.  She has good and bad days.     Observations/Objective: Per above.  Assessment and Plan: Encounter Diagnoses  Name Primary?  . Chronic midline low back pain without sciatica Yes  . Chronic pain syndrome   . Cigarette nicotine dependence without complication      Follow Up Instructions: Three months.  I called in pain medicine.  I have reviewed the [place] web site prior to prescribing narcotic medicine for this patient.      I discussed the assessment and treatment plan with the patient. The patient was provided an opportunity to ask questions and all were answered. The patient agreed with the plan and demonstrated an understanding of the instructions.   The patient was advised to call back or seek an in-person evaluation if the symptoms worsen or if the condition fails to improve as anticipated.  I provided 8 minutes of non-face-to-face time during this encounter.   Sanjuana Kava, MD

## 2020-05-01 ENCOUNTER — Telehealth (INDEPENDENT_AMBULATORY_CARE_PROVIDER_SITE_OTHER): Payer: 59 | Admitting: Psychiatry

## 2020-05-01 ENCOUNTER — Other Ambulatory Visit: Payer: Self-pay | Admitting: Orthopaedic Surgery

## 2020-05-01 ENCOUNTER — Other Ambulatory Visit: Payer: Self-pay

## 2020-05-01 DIAGNOSIS — F410 Panic disorder [episodic paroxysmal anxiety] without agoraphobia: Secondary | ICD-10-CM | POA: Diagnosis not present

## 2020-05-01 DIAGNOSIS — F411 Generalized anxiety disorder: Secondary | ICD-10-CM | POA: Diagnosis not present

## 2020-05-01 DIAGNOSIS — F3341 Major depressive disorder, recurrent, in partial remission: Secondary | ICD-10-CM

## 2020-05-01 MED ORDER — ALPRAZOLAM 1 MG PO TABS: 1.0000 mg | ORAL_TABLET | Freq: Four times a day (QID) | ORAL | 2 refills | Status: DC | PRN

## 2020-05-01 MED ORDER — DULOXETINE HCL 60 MG PO CPEP: 60.0000 mg | ORAL_CAPSULE | Freq: Two times a day (BID) | ORAL | 1 refills | Status: DC

## 2020-05-01 NOTE — Progress Notes (Signed)
[place] MD/PA/NP OP Progress Note  05/01/2020 8:15 AM Vanessa Bowen  MRN:  [id number] Interview was conducted by phone and I verified that I was speaking with the correct person using two identifiers. I discussed the limitations of evaluation and management by telemedicine and  the availability of in person appointments. Patient expressed understanding and agreed to proceed. Patient location - home; physician - home office.  Chief Complaint: Anxiety.  HPI: 44yo married female with panic disorder, GAD and MDD.She has beendepressed,anxious and having more problems with sleep due to stress related toCOVID. Shereportedthat alprazolamworks better than clonazepamdidso we changed it back.We increased duloxetine and trazodone doses. Ally now reports feelingless depressedbut still anxious. Her father has been recently diagnosed with lymphoma which contributes to high stress level. 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 Alprazolam used as neededbut she takes it daily.Trazodone 300 mg taken every night with good effect. No SI, normal appetite, no psychosis.   Visit Diagnosis:    ICD-10-CM   1. GAD (generalized anxiety disorder)  F41.1   2. Major depressive disorder, recurrent episode, in partial remission ([place])  F33.41   3. Panic disorder  F41.0     Past Psychiatric History: Please see intake H&P.  Past Medical History:  Past Medical History:  Diagnosis Date  . Anxiety   . Arthritis   . Chronic back pain   . Colitis, ulcerative ([place])   . Depression   . Hypothyroidism   . Migraine     Past Surgical History:  Procedure Laterality Date  . ANKLE SURGERY    . BREAST ENHANCEMENT SURGERY    . CESAREAN SECTION    . KNEE SURGERY Right    arthroscopy  . LAPAROSCOPIC BILATERAL SALPINGECTOMY  07/27/2012   Procedure: LAPAROSCOPIC BILATERAL SALPINGECTOMY;  Surgeon: Florian Buff, MD;  Location: [facility];  Service: Gynecology;  Laterality: N/A;  . OOPHORECTOMY Right  09/09/2016   Procedure: RIGHT OOPHORECTOMY;  Surgeon: Florian Buff, MD;  Location: [facility];  Service: Gynecology;  Laterality: Right;  . OVARIAN CYST REMOVAL    . VAGINAL HYSTERECTOMY N/A 09/09/2016   Procedure: HYSTERECTOMY VAGINAL;  Surgeon: Florian Buff, MD;  Location: [facility];  Service: Gynecology;  Laterality: N/A;    Family Psychiatric History: Reviewed.  Family History:  Family History  Problem Relation Age of Onset  . Cancer Maternal Grandmother   . Cancer Maternal Grandfather   . Alcohol abuse Maternal Grandfather   . Hypothyroidism Daughter   . Bipolar disorder Daughter   . Anxiety disorder Daughter   . Depression Mother   . Alcohol abuse Mother   . Drug abuse Sister   . Anxiety disorder Sister     Social History:  Social History   Socioeconomic History  . Marital status: Legally Separated    Spouse name: Not on file  . Number of children: 2  . Years of education: Not on file  . Highest education level: Not on file  Occupational History  . Not on file  Tobacco Use  . Smoking status: Current Every Day Smoker    Packs/day: 0.50    Years: 15.00    Pack years: 7.50    Types: Cigarettes  . Smokeless tobacco: Never Used  Vaping Use  . Vaping Use: Never used  Substance and Sexual Activity  . Alcohol use: Yes    Comment: Occasionally  . Drug use: No  . Sexual activity: Not Currently    Birth control/protection: Surgical  Comment: hyst  Other Topics Concern  . Not on file  Social History Narrative  . Not on file   Social Determinants of Health   Financial Resource Strain:   . Difficulty of Paying Living Expenses: Not on file  Food Insecurity:   . Worried About Charity fundraiser in the Last Year: Not on file  . Ran Out of Food in the Last Year: Not on file  Transportation Needs:   . Lack of Transportation (Medical): Not on file  . Lack of Transportation (Non-Medical): Not on file  Physical Activity:   . Days of Exercise per Week: Not on file  .  Minutes of Exercise per Session: Not on file  Stress:   . Feeling of Stress : Not on file  Social Connections:   . Frequency of Communication with Friends and Family: Not on file  . Frequency of Social Gatherings with Friends and Family: Not on file  . Attends Religious Services: Not on file  . Active Member of Clubs or Organizations: Not on file  . Attends Archivist Meetings: Not on file  . Marital Status: Not on file    Allergies:  Allergies  Allergen Reactions  . Doxycycline Nausea And Vomiting  . Sulfonamide Derivatives Nausea And Vomiting    Metabolic Disorder Labs: Lab Results  Component Value Date   HGBA1C 5.2 04/27/2017   MPG 103 04/27/2017   MPG 100 07/21/2016   No results found for: PROLACTIN No results found for: CHOL, TRIG, HDL, CHOLHDL, VLDL, LDLCALC Lab Results  Component Value Date   TSH 2.92 02/27/2020   TSH 0.06 (L) 11/27/2019    Therapeutic Level Labs: No results found for: LITHIUM No results found for: VALPROATE No components found for:  CBMZ  Current Medications: Current Outpatient Medications  Medication Sig Dispense Refill  . ALPRAZolam (XANAX) 1 MG tablet Take 1 tablet (1 mg total) by mouth 4 (four) times daily as needed for anxiety. 120 tablet 2  . conjugated estrogens (PREMARIN) vaginal cream USE 1 GRAM NIGHTLY AS DIRECTED (Patient taking differently: Place 1 Applicatorful vaginally 2 (two) times a week. At bedtime`) 90 g 4  . DIVIGEL 1 MG/GM GEL PLACE 1 PACKET ONTO THE SKIN DAILY. (Patient taking differently: Apply 1 packet topically at bedtime. ) 1 g 3  . [START ON 06/21/2020] DULoxetine (CYMBALTA) 60 MG capsule Take 1 capsule (60 mg total) by mouth 2 (two) times daily. 180 capsule 1  . HYDROcodone-acetaminophen (NORCO/VICODIN) 5-325 MG tablet One tablet by mouth every six hours as needed for pain. 30 days between refills. 60 tablet 0  . ibuprofen (ADVIL,MOTRIN) 200 MG tablet Take 400-600 mg by mouth every 8 (eight) hours as needed  (for pain/headaches.).    Marland Kitchen levothyroxine (SYNTHROID) 112 MCG tablet Take 1 tablet (112 mcg total) by mouth daily before breakfast. 90 tablet 1  . tiZANidine (ZANAFLEX) 4 MG tablet Take 4 mg by mouth every 6 (six) hours as needed for muscle spasms.     . traZODone (DESYREL) 150 MG tablet Take 2 tablets (300 mg total) by mouth at bedtime. 180 tablet 1   No current facility-administered medications for this visit.     Psychiatric Specialty Exam: Review of Systems  Musculoskeletal: Positive for back pain.  Psychiatric/Behavioral: The patient is nervous/anxious.   All other systems reviewed and are negative.   Last menstrual period 07/24/2016.There is no height or weight on file to calculate BMI.  General Appearance: NA  Eye Contact:  NA  Speech:  Clear and Coherent and Normal Rate  Volume:  Normal  Mood:  Anxious  Affect:  NA  Thought Process:  Goal Directed  Orientation:  Full (Time, Place, and Person)  Thought Content: Rumination   Suicidal Thoughts:  No  Homicidal Thoughts:  No  Memory:  Immediate;   Good Recent;   Good Remote;   Good  Judgement:  Good  Insight:  Good  Psychomotor Activity:  NA  Concentration:  Concentration: Good  Recall:  Good  Fund of Knowledge: Good  Language: Good  Akathisia:  Negative  Handed:  Right  AIMS (if indicated): not done  Assets:  Communication Skills Desire for Improvement Financial Resources/Insurance Housing Resilience Social Support Talents/Skills  ADL's:  Intact  Cognition: WNL  Sleep:  Fair   Screenings: PHQ2-9     Office Visit from 09/07/2019 in [facility] Office Visit from 03/18/2016 in [facility]  PHQ-2 Total Score 0 0       Assessment and Plan: 44yo married female with panic disorder, GAD and MDD.She has beendepressed,anxious and having more problems with sleep due to stress related toCOVID. Shereportedthat alprazolamworks better than clonazepamdidso we changed it  back.We increased duloxetine and trazodone doses. Adelaide now reports feelingless depressedbut still anxious. Her father has been recently diagnosed with lymphoma which contributes to high stress level. 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 Alprazolam used as neededbut she takes it daily.Trazodone 300 mg taken every night with good effect. No SI, normal appetite, no psychosis.  Dx: Panic disorder; GAD; MDD recurrentin partial remission  Plan:Continue duloxetine 60 mg bid,alprazolam 1 mg qid prn anxiety and trazodone 300 mg at HS for sleep.The plan was discussed with patient who had an opportunity to ask questions and these were all answered. I spend25mnutes inphone contact with patient. Next appointment in34month   OlStephanie AcreMD 05/01/2020, 8:15 AM

## 2020-05-02 NOTE — Telephone Encounter (Signed)
Patient is aware about the refill. No other concerns.

## 2020-05-02 NOTE — Telephone Encounter (Signed)
She got hydrocodone suspension by another doctor this week.  I cannot refill this.

## 2020-05-07 ENCOUNTER — Other Ambulatory Visit: Payer: Self-pay | Admitting: Orthopaedic Surgery

## 2020-05-09 ENCOUNTER — Other Ambulatory Visit: Payer: Self-pay | Admitting: Orthopaedic Surgery

## 2020-05-09 NOTE — Telephone Encounter (Signed)
Once again, NO.  She has gotten hydrocodone liquid from Dr. Gerarda Fraction recently.  It may be for another condition but still NO.

## 2020-05-09 NOTE — Telephone Encounter (Signed)
I called the patient and let her know that she is not able fill the medication at this time. She is going to call after the first of next month to get her next refill of Oxycodone. She is not happy but I explained the doctor recommendation. She is going to take the medication over the counter until she can get her prescribed medication. Patient will call around the beginning of next month.

## 2020-05-09 NOTE — Telephone Encounter (Signed)
Patient called (back)-states refill due: HYDROcodone-acetaminophen (NORCO/VICODIN) 5-325 MG tablet 60 tablet  -[facility], [employer]

## 2020-05-22 ENCOUNTER — Other Ambulatory Visit: Payer: Self-pay | Admitting: Orthopaedic Surgery

## 2020-05-22 ENCOUNTER — Telehealth: Payer: Self-pay | Admitting: Orthopaedic Surgery

## 2020-05-22 DIAGNOSIS — G894 Chronic pain syndrome: Secondary | ICD-10-CM

## 2020-05-22 DIAGNOSIS — G8929 Other chronic pain: Secondary | ICD-10-CM

## 2020-05-22 DIAGNOSIS — M545 Low back pain, unspecified: Secondary | ICD-10-CM

## 2020-05-22 NOTE — Telephone Encounter (Signed)
Patient called and wants to know why Dr. Luna Glasgow wouldn't fill her pain medication.  Could you have someone(Betty, Barnet Pall, Amy) call her with an explanation?

## 2020-05-23 NOTE — Telephone Encounter (Signed)
Pt is aware that she couldn't get a refill of her hydrocodone due to a recent rx for cough syrup containing hydrocodone. Pt states she has been taking the ibuprofen/acetominophen combo medication and it has started making her sick. Pt would like to know when she can start getting her hydrocodone refilled. Please advise.

## 2020-05-23 NOTE — Telephone Encounter (Signed)
I called patient to advise we cannot refill the prescription narcotics.  She says she was never told anything about a weaning program. I explained to her why this is referenced, and that you did not want to have her on long term narcotics for her dx.  I asked her if she would like to have another pain management referral.  She says she was dismissed from [facility] previously because she told them that the meds they giving her made her sick.  I offered to ask you about making another referral to another facility and she is open to that.  I told her she can discuss all with you at her 07/03/19 appt.  Please advise on making another pain management referral?

## 2020-05-23 NOTE — Telephone Encounter (Signed)
Once again, she got narcotic from another physician.

## 2020-05-23 NOTE — Telephone Encounter (Signed)
She should try to get the narcotics from the other doctor.  I was going to begin a weaning program.  She has been without for a while and she should not be on narcotics from me for a long time anyway.

## 2020-05-23 NOTE — Telephone Encounter (Signed)
(  05/23/20, 4:44pm) Patient called back following speaking with Abigail Butts. Relays she checked on pain management at [facility] with Dr Clydell Hakim or Leonie Green. Please advise.

## 2020-05-28 NOTE — Telephone Encounter (Signed)
Referral entered in chart per Dr Luna Glasgow for patient's requested refer to.

## 2020-05-28 NOTE — Telephone Encounter (Signed)
She needs pain management where ever you can get her in to be seen.  She knows this.  We have had prior discussions.  Her constant calling for pain medicine reinforces this assessment.

## 2020-06-27 LAB — TSH: TSH: 0.065 u[IU]/mL — ABNORMAL LOW (ref 0.450–4.500)

## 2020-06-27 LAB — T4, FREE: Free T4: 1.89 ng/dL — ABNORMAL HIGH (ref 0.82–1.77)

## 2020-07-02 ENCOUNTER — Ambulatory Visit: Payer: Medicaid Other | Admitting: Nurse Practitioner

## 2020-07-02 ENCOUNTER — Ambulatory Visit: Payer: Medicaid Other | Admitting: Orthopaedic Surgery

## 2020-07-04 ENCOUNTER — Ambulatory Visit: Payer: Medicaid Other | Admitting: Nurse Practitioner

## 2020-07-05 ENCOUNTER — Encounter: Payer: Self-pay | Admitting: Nurse Practitioner

## 2020-07-05 ENCOUNTER — Other Ambulatory Visit: Payer: Self-pay

## 2020-07-05 ENCOUNTER — Ambulatory Visit (INDEPENDENT_AMBULATORY_CARE_PROVIDER_SITE_OTHER): Payer: 59 | Admitting: Nurse Practitioner

## 2020-07-05 VITALS — BP 97/64 | HR 76 | Ht 63.0 in | Wt 157.0 lb

## 2020-07-05 DIAGNOSIS — E038 Other specified hypothyroidism: Secondary | ICD-10-CM

## 2020-07-05 MED ORDER — LEVOTHYROXINE SODIUM 112 MCG PO TABS: 112.0000 ug | ORAL_TABLET | Freq: Every day | ORAL | 1 refills | Status: DC

## 2020-07-05 NOTE — Patient Instructions (Signed)

## 2020-07-05 NOTE — Progress Notes (Signed)
07/05/2020        Endocrinology follow-up note    Subjective:    Patient ID: Vanessa Bowen, female    DOB: 02/23/1976, PCP Redmond School, MD   Past Medical History:  Diagnosis Date  . Anxiety   . Arthritis   . Chronic back pain   . Colitis, ulcerative ([place])   . Depression   . Hypothyroidism   . Migraine    Past Surgical History:  Procedure Laterality Date  . ANKLE SURGERY    . BREAST ENHANCEMENT SURGERY    . CESAREAN SECTION    . KNEE SURGERY Right    arthroscopy  . LAPAROSCOPIC BILATERAL SALPINGECTOMY  07/27/2012   Procedure: LAPAROSCOPIC BILATERAL SALPINGECTOMY;  Surgeon: Florian Buff, MD;  Location: [facility];  Service: Gynecology;  Laterality: N/A;  . OOPHORECTOMY Right 09/09/2016   Procedure: RIGHT OOPHORECTOMY;  Surgeon: Florian Buff, MD;  Location: [facility];  Service: Gynecology;  Laterality: Right;  . OVARIAN CYST REMOVAL    . VAGINAL HYSTERECTOMY N/A 09/09/2016   Procedure: HYSTERECTOMY VAGINAL;  Surgeon: Florian Buff, MD;  Location: [facility];  Service: Gynecology;  Laterality: N/A;   Social History   Socioeconomic History  . Marital status: Legally Separated    Spouse name: Not on file  . Number of children: 2  . Years of education: Not on file  . Highest education level: Not on file  Occupational History  . Not on file  Tobacco Use  . Smoking status: Current Every Day Smoker    Packs/day: 0.50    Years: 15.00    Pack years: 7.50    Types: Cigarettes  . Smokeless tobacco: Never Used  Vaping Use  . Vaping Use: Never used  Substance and Sexual Activity  . Alcohol use: Yes    Comment: Occasionally  . Drug use: No  . Sexual activity: Not Currently    Birth control/protection: Surgical    Comment: hyst  Other Topics Concern  . Not on file  Social History Narrative  . Not on file   Social Determinants of Health   Financial Resource Strain: Not on file  Food Insecurity: Not on file  Transportation Needs: Not on file  Physical Activity: Not  on file  Stress: Not on file  Social Connections: Not on file   Outpatient Encounter Medications as of 07/05/2020  Medication Sig  . ALPRAZolam (XANAX) 1 MG tablet Take 1 tablet (1 mg total) by mouth 4 (four) times daily as needed for anxiety.  . DULoxetine (CYMBALTA) 60 MG capsule Take 1 capsule (60 mg total) by mouth 2 (two) times daily.  Marland Kitchen ibuprofen (ADVIL,MOTRIN) 200 MG tablet Take 400-600 mg by mouth every 8 (eight) hours as needed (for pain/headaches.).  Marland Kitchen tiZANidine (ZANAFLEX) 4 MG tablet Take 4 mg by mouth every 6 (six) hours as needed for muscle spasms.   . traZODone (DESYREL) 150 MG tablet Take 2 tablets (300 mg total) by mouth at bedtime.  . [DISCONTINUED] levothyroxine (SYNTHROID) 112 MCG tablet Take 1 tablet (112 mcg total) by mouth daily before breakfast.  . conjugated estrogens (PREMARIN) vaginal cream USE 1 GRAM NIGHTLY AS DIRECTED (Patient not taking: Reported on 07/05/2020)  . DIVIGEL 1 MG/GM GEL PLACE 1 PACKET ONTO THE SKIN DAILY. (Patient not taking: Reported on 07/05/2020)  . HYDROcodone-acetaminophen (NORCO/VICODIN) 5-325 MG tablet One tablet by mouth every six hours as needed for pain. 30 days between refills. (Patient not taking: Reported on 07/05/2020)  .  levothyroxine (SYNTHROID) 112 MCG tablet Take 1 tablet (112 mcg total) by mouth daily before breakfast.  . [DISCONTINUED] dicyclomine (BENTYL) 20 MG tablet Take 1 tablet (20 mg total) by mouth 3 (three) times daily as needed for up to 30 days for spasms.   No facility-administered encounter medications on file as of 07/05/2020.   ALLERGIES: Allergies  Allergen Reactions  . Doxycycline Nausea And Vomiting  . Sulfonamide Derivatives Nausea And Vomiting   VACCINATION STATUS:  There is no immunization history on file for this patient.  Thyroid Problem Presents for follow-up visit. Symptoms include anxiety, depressed mood, fatigue, palpitations and weight loss. Patient reports no cold intolerance, constipation,  diarrhea, heat intolerance or tremors. 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    45 yr old female with medical history.  She is returning with repeat thyroid function test for follow-up of longstanding hypothyroidism.    She has hypothyroidism diagnosed at approximate age of 1 years.  She is currently on levothyroxine 112 mcg p.o. every morning.  She reports better consistency taking her medication at this time.  She has no new complaints today.   She denies palpitations, tremors, nor heat/cold intolerance.    Review of systems  Constitutional: + steadily decreasing body weight,  current Body mass index is 27.81 kg/m. , + fatigue, no subjective hyperthermia, no subjective hypothermia Eyes: no blurry vision, no xerophthalmia ENT: no sore throat, no nodules palpated in throat, no dysphagia/odynophagia, no hoarseness Cardiovascular: no chest pain, no shortness of breath, + intermittent palpitations, no leg swelling Respiratory: no cough, no shortness of breath Gastrointestinal: no nausea/vomiting/diarrhea Musculoskeletal: no muscle/joint aches Skin: no rashes, no hyperemia Neurological: no tremors, no numbness, no tingling, no dizziness Psychiatric: + depression/anxiety     Objective:    BP 97/64 (BP Location: Left Arm, Patient Position: Sitting)   Pulse 76   Ht 5' 3"  (1.6 m)   Wt 157 lb (71.2 kg)   LMP 07/24/2016   BMI 27.81 kg/m   Wt Readings from Last 3 Encounters:  07/05/20 157 lb (71.2 kg)  01/02/20 172 lb (78 kg)  12/07/19 172 lb 3.2 oz (78.1 kg)    BP Readings from Last 3 Encounters:  07/05/20 97/64  12/07/19 107/72  10/25/19 120/80    Physical Exam- Limited  Constitutional:  Body mass index is 27.81 kg/m. , not in acute distress, normal state of mind Eyes:  EOMI, no exophthalmos Neck: Supple Thyroid: No gross goiter Cardiovascular: RRR, no murmers, rubs, or gallops, no  edema Respiratory: Adequate breathing efforts, no crackles, rales, rhonchi, or wheezing Musculoskeletal: no gross deformities, strength intact in all four extremities, no gross restriction of joint movements Skin:  no rashes, no hyperemia Neurological: no tremor with outstretched hands  CMP     Component Value Date/Time   NA 136 10/09/2019 1442   K 4.0 10/09/2019 1442   CL 101 10/09/2019 1442   CO2 25 10/09/2019 1442   GLUCOSE 123 (H) 10/09/2019 1442   BUN 15 10/09/2019 1442   CREATININE 0.81 10/09/2019 1442   CREATININE 1.05 04/27/2017 1226   CALCIUM 9.6 10/09/2019 1442   PROT 7.6 10/09/2019 1442   ALBUMIN 4.5 10/09/2019 1442   AST 14 (L) 10/09/2019 1442   ALT 14 10/09/2019 1442   ALKPHOS 57 10/09/2019 1442   BILITOT 0.6 10/09/2019 1442   GFRNONAA >60 10/09/2019 1442   GFRAA >60 10/09/2019 1442   Recent Results (from  the past 2160 hour(s))  TSH     Status: Abnormal   Collection Time: 06/26/20 11:07 AM  Result Value Ref Range   TSH 0.065 (L) 0.450 - 4.500 uIU/mL  T4, free     Status: Abnormal   Collection Time: 06/26/20 11:07 AM  Result Value Ref Range   Free T4 1.89 (H) 0.82 - 1.77 ng/dL    Assessment & Plan:   1. Hypothyroidism  -Her previsit labs are consistent with slight over-replacement, however may be acceptable for the season.  She is advised to continue current dose of Levothyroxine 112 mcg po daily before breakfast.   - We discussed about the correct intake of her thyroid hormone, on empty stomach at fasting, with water, separated by at least 30 minutes from breakfast and other medications,  and separated by more than 4 hours from calcium, iron, multivitamins, acid reflux medications (PPIs). -Patient is made aware of the fact that thyroid hormone replacement is needed for life, dose to be adjusted by periodic monitoring of thyroid function tests.  - I advised patient to maintain close follow up with Redmond School, MD for primary care needs.     - Time  spent on this patient care encounter:  20 minutes of which 50% was spent in  counseling and the rest reviewing  her current and  previous labs / studies and medications  doses and developing a plan for long term care. Vanessa Bowen  participated in the discussions, expressed understanding, and voiced agreement with the above plans.  All questions were answered to her satisfaction. she is encouraged to contact clinic should she have any questions or concerns prior to her return visit.   Follow up plan: Return in about 6 months (around 01/02/2021) for Thyroid follow up, Previsit labs.  Rayetta Pigg, [facility] [address] [place], [place] [address] Phone: [phone] Fax: [phone]  07/05/2020, 12:04 PM

## 2020-07-26 ENCOUNTER — Other Ambulatory Visit: Payer: Self-pay

## 2020-07-26 ENCOUNTER — Telehealth (INDEPENDENT_AMBULATORY_CARE_PROVIDER_SITE_OTHER): Payer: 59 | Admitting: Psychiatry

## 2020-07-26 DIAGNOSIS — F410 Panic disorder [episodic paroxysmal anxiety] without agoraphobia: Secondary | ICD-10-CM | POA: Diagnosis not present

## 2020-07-26 DIAGNOSIS — F411 Generalized anxiety disorder: Secondary | ICD-10-CM

## 2020-07-26 DIAGNOSIS — F3341 Major depressive disorder, recurrent, in partial remission: Secondary | ICD-10-CM

## 2020-07-26 MED ORDER — TRAZODONE HCL 150 MG PO TABS: 300.0000 mg | ORAL_TABLET | Freq: Every day | ORAL | 1 refills | Status: DC

## 2020-07-26 MED ORDER — DULOXETINE HCL 60 MG PO CPEP: 60.0000 mg | ORAL_CAPSULE | Freq: Two times a day (BID) | ORAL | 1 refills | Status: DC

## 2020-07-26 MED ORDER — ALPRAZOLAM 1 MG PO TABS: 1.0000 mg | ORAL_TABLET | Freq: Three times a day (TID) | ORAL | 2 refills | Status: DC | PRN

## 2020-07-26 NOTE — Progress Notes (Signed)
[place] MD/PA/NP OP Progress Note  07/26/2020 9:23 AM Vanessa Bowen  MRN:  [id number] Interview was conducted by phone and I verified that I was speaking with the correct person using two identifiers. I discussed the limitations of evaluation and management by telemedicine and  the availability of in person appointments. Patient expressed understanding and agreed to proceed. Participants in the visit: patient (location - home); physician (location - home office).  Chief Complaint: Anxiety.  HPI: 45yo married female with panic disorder, GAD and MDD.She has beendepressed,anxious and having more problems with sleep due to stress related toCOVID. Shereportedthat alprazolamworks better than clonazepamdidso we changed it back.We increased duloxetine and trazodone doses. Vanessa Bowen now reports feelingless depressedbut still anxious. Her father has been recently diagnosed with lymphoma which contributes to high stress level. 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 Alprazolam used as neededbut she takes it daily.Trazodone 300 mg taken every night with good effect. No SI, normal appetite, no psychosis.She now goes to pain clinic for chronic back pain (on hydrocodone) and we will try to gradually decrease her use of benzodiazepine.   Visit Diagnosis:    ICD-10-CM   1. Panic disorder  F41.0   2. GAD (generalized anxiety disorder)  F41.1   3. Major depressive disorder, recurrent episode, in partial remission (HCC)  F33.41     Past Psychiatric History: Please see intake H&P.  Past Medical History:  Past Medical History:  Diagnosis Date  . Anxiety   . Arthritis   . Chronic back pain   . Colitis, ulcerative ([place])   . Depression   . Hypothyroidism   . Migraine     Past Surgical History:  Procedure Laterality Date  . ANKLE SURGERY    . BREAST ENHANCEMENT SURGERY    . CESAREAN SECTION    . KNEE SURGERY Right    arthroscopy   . LAPAROSCOPIC BILATERAL SALPINGECTOMY  07/27/2012   Procedure: LAPAROSCOPIC BILATERAL SALPINGECTOMY;  Surgeon: Florian Buff, MD;  Location: [facility];  Service: Gynecology;  Laterality: N/A;  . OOPHORECTOMY Right 09/09/2016   Procedure: RIGHT OOPHORECTOMY;  Surgeon: Florian Buff, MD;  Location: [facility];  Service: Gynecology;  Laterality: Right;  . OVARIAN CYST REMOVAL    . VAGINAL HYSTERECTOMY N/A 09/09/2016   Procedure: HYSTERECTOMY VAGINAL;  Surgeon: Florian Buff, MD;  Location: [facility];  Service: Gynecology;  Laterality: N/A;    Family Psychiatric History: Reviewed.  Family History:  Family History  Problem Relation Age of Onset  . Cancer Maternal Grandmother   . Cancer Maternal Grandfather   . Alcohol abuse Maternal Grandfather   . Hypothyroidism Daughter   . Bipolar disorder Daughter   . Anxiety disorder Daughter   . Depression Mother   . Alcohol abuse Mother   . Drug abuse Sister   . Anxiety disorder Sister     Social History:  Social History   Socioeconomic History  . Marital status: Legally Separated    Spouse name: Not on file  . Number of children: 2  . Years of education: Not on file  . Highest education level: Not on file  Occupational History  . Not on file  Tobacco Use  . Smoking status: Current Every Day Smoker    Packs/day: 0.50    Years: 15.00    Pack years: 7.50    Types: Cigarettes  . Smokeless tobacco: Never Used  Vaping Use  .  Vaping Use: Never used  Substance and Sexual Activity  . Alcohol use: Yes    Comment: Occasionally  . Drug use: No  . Sexual activity: Not Currently    Birth control/protection: Surgical    Comment: hyst  Other Topics Concern  . Not on file  Social History Narrative  . Not on file   Social Determinants of Health   Financial Resource Strain: Not on file  Food Insecurity: Not on file  Transportation Needs: Not on file  Physical Activity: Not on file  Stress: Not on file  Social Connections: Not on file     Allergies:  Allergies  Allergen Reactions  . Doxycycline Nausea And Vomiting  . Sulfonamide Derivatives Nausea And Vomiting    Metabolic Disorder Labs: Lab Results  Component Value Date   HGBA1C 5.2 04/27/2017   MPG 103 04/27/2017   MPG 100 07/21/2016   No results found for: PROLACTIN No results found for: CHOL, TRIG, HDL, CHOLHDL, VLDL, LDLCALC Lab Results  Component Value Date   TSH 0.065 (L) 06/26/2020   TSH 2.92 02/27/2020    Therapeutic Level Labs: No results found for: LITHIUM No results found for: VALPROATE No components found for:  CBMZ  Current Medications: Current Outpatient Medications  Medication Sig Dispense Refill  . ALPRAZolam (XANAX) 1 MG tablet Take 1 tablet (1 mg total) by mouth 3 (three) times daily as needed for anxiety. 90 tablet 2  . conjugated estrogens (PREMARIN) vaginal cream USE 1 GRAM NIGHTLY AS DIRECTED (Patient not taking: Reported on 07/05/2020) 90 g 4  . DIVIGEL 1 MG/GM GEL PLACE 1 PACKET ONTO THE SKIN DAILY. (Patient not taking: Reported on 07/05/2020) 1 g 3  . DULoxetine (CYMBALTA) 60 MG capsule Take 1 capsule (60 mg total) by mouth 2 (two) times daily. 180 capsule 1  . HYDROcodone-acetaminophen (NORCO/VICODIN) 5-325 MG tablet One tablet by mouth every six hours as needed for pain. 30 days between refills. (Patient not taking: Reported on 07/05/2020) 60 tablet 0  . ibuprofen (ADVIL,MOTRIN) 200 MG tablet Take 400-600 mg by mouth every 8 (eight) hours as needed (for pain/headaches.).    Marland Kitchen levothyroxine (SYNTHROID) 112 MCG tablet Take 1 tablet (112 mcg total) by mouth daily before breakfast. 90 tablet 1  . tiZANidine (ZANAFLEX) 4 MG tablet Take 4 mg by mouth every 6 (six) hours as needed for muscle spasms.     . traZODone (DESYREL) 150 MG tablet Take 2 tablets (300 mg total) by mouth at bedtime. 180 tablet 1   No current facility-administered medications for this visit.     Psychiatric Specialty Exam: Review of Systems  Musculoskeletal:  Positive for back pain.  Psychiatric/Behavioral: The patient is nervous/anxious.   All other systems reviewed and are negative.   Last menstrual period 07/24/2016.There is no height or weight on file to calculate BMI.  General Appearance: NA  Eye Contact:  NA  Speech:  Clear and Coherent and Normal Rate  Volume:  Normal  Mood:  Anxious  Affect:  NA  Thought Process:  Goal Directed  Orientation:  Full (Time, Place, and Person)  Thought Content: Logical   Suicidal Thoughts:  No  Homicidal Thoughts:  No  Memory:  Immediate;   Good Recent;   Good Remote;   Good  Judgement:  Good  Insight:  Good  Psychomotor Activity:  NA  Concentration:  Concentration: Good  Recall:  Good  Fund of Knowledge: Good  Language: Good  Akathisia:  Negative  Handed:  Right  AIMS (  if indicated): not done  Assets:  Communication Skills Desire for Improvement Housing Resilience Talents/Skills  ADL's:  Intact  Cognition: WNL  Sleep:  Fair   Screenings: [place] Office Visit from 09/07/2019 in [place] Office Visit from 03/18/2016 in [facility]  PHQ-2 Total Score 0 0       Assessment and Plan: 45yo married female with panic disorder, GAD and MDD.She has beendepressed,anxious and having more problems with sleep due to stress related toCOVID. Shereportedthat alprazolamworks better than clonazepamdidso we changed it back.We increased duloxetine and trazodone doses. Vanessa Bowen now reports feelingless depressedbut still anxious. Her father has been recently diagnosed with lymphoma which contributes to high stress level. 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 Alprazolam used as neededbut she takes it daily.Trazodone 300 mg taken every night with good effect. No SI, normal appetite, no psychosis.She now goes to pain clinic for chronic back pain (on hydrocodone) and we will try to  gradually decrease her use of benzodiazepine.  Dx: Panic disorder; GAD; MDD recurrentin partial remission  Plan:Continue duloxetine 60 mg bid,alprazolam 1 mgbut decrease to tid prn (she will try to use it twice daily if possible) anxiety and trazodone300 mg at HS for sleep.The plan was discussed with patient who had an opportunity to ask questions and these were all answered. I spend74mnutes inphone contact with patient. Next appointment in378monthwith a new provider.   OlStephanie AcreMD 07/26/2020, 9:23 AM

## 2020-09-26 ENCOUNTER — Ambulatory Visit: Payer: 59

## 2020-09-26 ENCOUNTER — Encounter: Payer: Self-pay | Admitting: Orthopaedic Surgery

## 2020-09-26 ENCOUNTER — Ambulatory Visit (INDEPENDENT_AMBULATORY_CARE_PROVIDER_SITE_OTHER): Payer: 59 | Admitting: Orthopaedic Surgery

## 2020-09-26 ENCOUNTER — Other Ambulatory Visit: Payer: Self-pay

## 2020-09-26 VITALS — BP 121/73 | HR 80 | Ht 63.0 in | Wt 154.2 lb

## 2020-09-26 DIAGNOSIS — G8929 Other chronic pain: Secondary | ICD-10-CM | POA: Diagnosis not present

## 2020-09-26 DIAGNOSIS — M25462 Effusion, left knee: Secondary | ICD-10-CM

## 2020-09-26 DIAGNOSIS — F17210 Nicotine dependence, cigarettes, uncomplicated: Secondary | ICD-10-CM | POA: Diagnosis not present

## 2020-09-26 DIAGNOSIS — M25562 Pain in left knee: Secondary | ICD-10-CM

## 2020-09-26 DIAGNOSIS — G894 Chronic pain syndrome: Secondary | ICD-10-CM

## 2020-09-26 NOTE — Patient Instructions (Signed)

## 2020-09-26 NOTE — Progress Notes (Signed)
Patient Vanessa Bowen, female DOB:[birth year], 45 y.o. EPP:[id number]  Chief Complaint  Patient presents with  . Knee Pain    L/ had 2 knots come up (one in front and back). Gave out while walking caught self on dining room table.    HPI  Vanessa Bowen is a 45 y.o. female who has had chronic pain of the left knee.  She [place] the zoo and walked around a lot two days ago.  She began having swelling of the knee toward the end of the day.  The next day she had swelling of the knee, pain and then it gave way. It has given way multiple times since then and she has fallen.  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  She is very concerned. She has pain. She has tried ice and has a knee brace that does not help. She has pain medicine via the pain clinic.   Body mass index is 27.32 kg/m.  ROS  Review of Systems  Constitutional: Positive for activity change.  HENT: Negative for congestion.   Respiratory: Negative for cough and shortness of breath.   Cardiovascular: Negative for chest pain and leg swelling.  Endocrine: Positive for cold intolerance.  Musculoskeletal: Positive for arthralgias, back pain, gait problem and joint swelling.  Allergic/Immunologic: Positive for environmental allergies.  Neurological: Positive for headaches.  Psychiatric/Behavioral: The patient is nervous/anxious.   All other systems reviewed and are negative.   All other systems reviewed and are negative.  The following is a summary of the past history medically, past history surgically, known current medicines, social history and family history.  This information is gathered electronically by the computer from prior information and documentation.  I review this each visit and have found including this information at this point in the chart is beneficial and informative.    Past Medical History:  Diagnosis Date  . Anxiety   . Arthritis   . Chronic back pain   . Colitis, ulcerative ([place])   . Depression    . Hypothyroidism   . Migraine     Past Surgical History:  Procedure Laterality Date  . ANKLE SURGERY    . BREAST ENHANCEMENT SURGERY    . CESAREAN SECTION    . KNEE SURGERY Right    arthroscopy  . LAPAROSCOPIC BILATERAL SALPINGECTOMY  07/27/2012   Procedure: LAPAROSCOPIC BILATERAL SALPINGECTOMY;  Surgeon: Florian Buff, MD;  Location: [facility];  Service: Gynecology;  Laterality: N/A;  . OOPHORECTOMY Right 09/09/2016   Procedure: RIGHT OOPHORECTOMY;  Surgeon: Florian Buff, MD;  Location: [facility];  Service: Gynecology;  Laterality: Right;  . OVARIAN CYST REMOVAL    . VAGINAL HYSTERECTOMY N/A 09/09/2016   Procedure: HYSTERECTOMY VAGINAL;  Surgeon: Florian Buff, MD;  Location: [facility];  Service: Gynecology;  Laterality: N/A;    Family History  Problem Relation Age of Onset  . Cancer Maternal Grandmother   . Cancer Maternal Grandfather   . Alcohol abuse Maternal Grandfather   . Hypothyroidism Daughter   . Bipolar disorder Daughter   . Anxiety disorder Daughter   . Depression Mother   . Alcohol abuse Mother   . Drug abuse Sister   . Anxiety disorder Sister     Social History Social History   Tobacco Use  . Smoking status: Current Every Day Smoker    Packs/day: 0.50    Years: 15.00    Pack years: 7.50    Types: Cigarettes  . Smokeless tobacco:  Never Used  Vaping Use  . Vaping Use: Never used  Substance Use Topics  . Alcohol use: Yes    Comment: Occasionally  . Drug use: No    Allergies  Allergen Reactions  . Doxycycline Nausea And Vomiting  . Sulfonamide Derivatives Nausea And Vomiting    Current Outpatient Medications  Medication Sig Dispense Refill  . ALPRAZolam (XANAX) 1 MG tablet Take 1 tablet (1 mg total) by mouth 3 (three) times daily as needed for anxiety. 90 tablet 2  . conjugated estrogens (PREMARIN) vaginal cream USE 1 GRAM NIGHTLY AS DIRECTED 90 g 4  . DIVIGEL 1 MG/GM GEL PLACE 1 PACKET ONTO THE SKIN DAILY. 1 g 3  . DULoxetine (CYMBALTA) 60 MG capsule  Take 1 capsule (60 mg total) by mouth 2 (two) times daily. 180 capsule 1  . HYDROcodone-acetaminophen (NORCO/VICODIN) 5-325 MG tablet One tablet by mouth every six hours as needed for pain. 30 days between refills. 60 tablet 0  . ibuprofen (ADVIL,MOTRIN) 200 MG tablet Take 400-600 mg by mouth every 8 (eight) hours as needed (for pain/headaches.).    Marland Kitchen levothyroxine (SYNTHROID) 112 MCG tablet Take 1 tablet (112 mcg total) by mouth daily before breakfast. 90 tablet 1  . tiZANidine (ZANAFLEX) 4 MG tablet Take 4 mg by mouth every 6 (six) hours as needed for muscle spasms.     . traZODone (DESYREL) 150 MG tablet Take 2 tablets (300 mg total) by mouth at bedtime. 180 tablet 1   No current facility-administered medications for this visit.     Physical Exam  Blood pressure 121/73, pulse 80, height 5' 3"  (1.6 m), weight 154 lb 4 oz (70 kg), last menstrual period 07/24/2016.  Constitutional: overall normal hygiene, normal nutrition, well developed, normal grooming, normal body habitus. Assistive device:left knee sleeve  Musculoskeletal: gait and station Limp left very significant, muscle tone and strength are normal, no tremors or atrophy is present.  .  Neurological: coordination overall normal.  Deep tendon reflex/nerve stretch intact.  Sensation normal.  Cranial nerves II-XII intact.   Skin:   Normal overall no scars, lesions, ulcers or rashes. No psoriasis.  Psychiatric: Alert and oriented x 3.  Recent memory intact, remote memory unclear.  Normal mood and affect. Well groomed.  Good eye contact.  Cardiovascular: overall no swelling, no varicosities, no edema bilaterally, normal temperatures of the legs and arms, no clubbing, cyanosis and good capillary refill.  Lymphatic: palpation is normal.  Left knee with effusion, crepitus, ROM 0 to 100 with pain, positive medial Mc[place]ray, limp left, NV intact.  All other systems reviewed and are negative   The patient has been educated about the  nature of the problem(s) and counseled on treatment options.  The patient appeared to understand what I have discussed and is in agreement with it.  Encounter Diagnoses  Name Primary?  . Chronic pain of left knee Yes  . Chronic pain syndrome   . Cigarette nicotine dependence without complication    X-rays were done of the left knee, reported separately.  I am concerned about meniscus tear of the left knee.  I would like to get MRI.  PLAN Call if any problems.  Precautions discussed.  Continue current medications.   Return to clinic 3 weeks   A knee immobilizer was given.  Get MRI of the knee, return earlier if it is done earlier.  Electronically Signed Sanjuana Kava, MD 4/7/202210:30 AM

## 2020-10-07 ENCOUNTER — Telehealth (HOSPITAL_COMMUNITY): Payer: Medicaid Other | Admitting: Family

## 2020-10-17 ENCOUNTER — Ambulatory Visit: Payer: 59 | Admitting: Orthopaedic Surgery

## 2020-10-23 ENCOUNTER — Encounter (HOSPITAL_COMMUNITY): Payer: Self-pay | Admitting: Psychiatry

## 2020-10-23 ENCOUNTER — Other Ambulatory Visit: Payer: Self-pay

## 2020-10-23 ENCOUNTER — Telehealth (INDEPENDENT_AMBULATORY_CARE_PROVIDER_SITE_OTHER): Payer: 59 | Admitting: Psychiatry

## 2020-10-23 DIAGNOSIS — F3341 Major depressive disorder, recurrent, in partial remission: Secondary | ICD-10-CM | POA: Diagnosis not present

## 2020-10-23 DIAGNOSIS — F411 Generalized anxiety disorder: Secondary | ICD-10-CM

## 2020-10-23 MED ORDER — DULOXETINE HCL 60 MG PO CPEP: 60.0000 mg | ORAL_CAPSULE | Freq: Every day | ORAL | 2 refills | Status: DC

## 2020-10-23 MED ORDER — ALPRAZOLAM 1 MG PO TABS: ORAL_TABLET | ORAL | 0 refills | Status: DC

## 2020-10-23 MED ORDER — TRAZODONE HCL 150 MG PO TABS: 300.0000 mg | ORAL_TABLET | Freq: Every day | ORAL | 1 refills | Status: DC

## 2020-10-23 NOTE — Progress Notes (Signed)
[place] MD/PA/NP OP Progress Note  10/23/2020 9:40 AM Vanessa Bowen  MRN:  [id number] Interview was conducted by phone and I verified that I was speaking with the correct person using two identifiers. I discussed the limitations of evaluation and management by telemedicine and  the availability of in person appointments. Patient expressed understanding and agreed to proceed. Participants in the visit: patient (location - home); physician (location - home office).  Chief Complaint: Anxiety.  HPI: 45yo married female with panic disorder, GAD and MDD.She has beendepressed,anxious and having more problems with sleep due to stress related toCOVID. Patient was previously a patient of Dr.Pucilowska who is no longer with [facility]. She stopped the second dose of the cymbalta since it has been interfering with her sleep. States the trazodone at 360m is sometimes helpful and does not work sometimes. She has been taking the alprazolam at 46mthree times daily not as needed but regularly and is out of refills.  Continues to be stressed about her father's health. Denies suicidal thoughts. Denies use of alcohol or other substances.  Per Dr.Pucilowska, at his last visit with patient, Shereportedthat alprazolamworks better than clonazepamdidso we changed it back.We increased duloxetine and trazodone doses. [place] reports feelingless depressedbut still anxious. Her father has been recently diagnosed with lymphoma which contributes to high stress level. 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 Alprazolam used as neededbut she takes it daily.Trazodone 300 mg taken every night with good effect. No SI, normal appetite, no psychosis.She now goes to pain clinic for chronic back pain (on hydrocodone) and we will try to gradually decrease her use of benzodiazepine.   Visit Diagnosis:    ICD-10-CM   1. Major depressive disorder, recurrent  episode, in partial remission ([facility] F33.41   2. GAD (generalized anxiety disorder)  F41.1     Past Psychiatric History: Please see intake H&P.  Past Medical History:  Past Medical History:  Diagnosis Date  . Anxiety   . Arthritis   . Chronic back pain   . Colitis, ulcerative ([facility]  . Depression   . Hypothyroidism   . Migraine     Past Surgical History:  Procedure Laterality Date  . ANKLE SURGERY    . BREAST ENHANCEMENT SURGERY    . CESAREAN SECTION    . KNEE SURGERY Right    arthroscopy  . LAPAROSCOPIC BILATERAL SALPINGECTOMY  07/27/2012   Procedure: LAPAROSCOPIC BILATERAL SALPINGECTOMY;  Surgeon: LuFlorian BuffMD;  Location: [facility];  Service: Gynecology;  Laterality: N/A;  . OOPHORECTOMY Right 09/09/2016   Procedure: RIGHT OOPHORECTOMY;  Surgeon: LuFlorian BuffMD;  Location: [facility];  Service: Gynecology;  Laterality: Right;  . OVARIAN CYST REMOVAL    . VAGINAL HYSTERECTOMY N/A 09/09/2016   Procedure: HYSTERECTOMY VAGINAL;  Surgeon: LuFlorian BuffMD;  Location: [facility];  Service: Gynecology;  Laterality: N/A;    Family Psychiatric History: Reviewed.  Family History:  Family History  Problem Relation Age of Onset  . Cancer Maternal Grandmother   . Cancer Maternal Grandfather   . Alcohol abuse Maternal Grandfather   . Hypothyroidism Daughter   . Bipolar disorder Daughter   . Anxiety disorder Daughter   . Depression Mother   . Alcohol abuse Mother   . Drug abuse Sister   . Anxiety disorder Sister     Social History:  Social History   Socioeconomic History  .  Marital status: Legally Separated    Spouse name: Not on file  . Number of children: 2  . Years of education: Not on file  . Highest education level: Not on file  Occupational History  . Not on file  Tobacco Use  . Smoking status: Current Every Day Smoker    Packs/day: 0.50    Years: 15.00    Pack years: 7.50    Types: Cigarettes  . Smokeless tobacco: Never Used  Vaping Use  . Vaping Use: Never  used  Substance and Sexual Activity  . Alcohol use: Yes    Comment: Occasionally  . Drug use: No  . Sexual activity: Not Currently    Birth control/protection: Surgical    Comment: hyst  Other Topics Concern  . Not on file  Social History Narrative  . Not on file   Social Determinants of Health   Financial Resource Strain: Not on file  Food Insecurity: Not on file  Transportation Needs: Not on file  Physical Activity: Not on file  Stress: Not on file  Social Connections: Not on file    Allergies:  Allergies  Allergen Reactions  . Doxycycline Nausea And Vomiting  . Sulfonamide Derivatives Nausea And Vomiting    Metabolic Disorder Labs: Lab Results  Component Value Date   HGBA1C 5.2 04/27/2017   MPG 103 04/27/2017   MPG 100 07/21/2016   No results found for: PROLACTIN No results found for: CHOL, TRIG, HDL, CHOLHDL, VLDL, LDLCALC Lab Results  Component Value Date   TSH 0.065 (L) 06/26/2020   TSH 2.92 02/27/2020    Therapeutic Level Labs: No results found for: LITHIUM No results found for: VALPROATE No components found for:  CBMZ  Current Medications: Current Outpatient Medications  Medication Sig Dispense Refill  . ALPRAZolam (XANAX) 1 MG tablet Take 1 tablet (1 mg total) by mouth 3 (three) times daily as needed for anxiety. 90 tablet 2  . conjugated estrogens (PREMARIN) vaginal cream USE 1 GRAM NIGHTLY AS DIRECTED 90 g 4  . DIVIGEL 1 MG/GM GEL PLACE 1 PACKET ONTO THE SKIN DAILY. 1 g 3  . DULoxetine (CYMBALTA) 60 MG capsule Take 1 capsule (60 mg total) by mouth 2 (two) times daily. 180 capsule 1  . HYDROcodone-acetaminophen (NORCO/VICODIN) 5-325 MG tablet One tablet by mouth every six hours as needed for pain. 30 days between refills. 60 tablet 0  . ibuprofen (ADVIL,MOTRIN) 200 MG tablet Take 400-600 mg by mouth every 8 (eight) hours as needed (for pain/headaches.).    Marland Kitchen levothyroxine (SYNTHROID) 112 MCG tablet Take 1 tablet (112 mcg total) by mouth daily  before breakfast. 90 tablet 1  . tiZANidine (ZANAFLEX) 4 MG tablet Take 4 mg by mouth every 6 (six) hours as needed for muscle spasms.     . traZODone (DESYREL) 150 MG tablet Take 2 tablets (300 mg total) by mouth at bedtime. 180 tablet 1   No current facility-administered medications for this visit.     Psychiatric Specialty Exam: Review of Systems  Musculoskeletal: Positive for back pain.  Psychiatric/Behavioral: The patient is nervous/anxious.   All other systems reviewed and are negative.   Last menstrual period 07/24/2016.There is no height or weight on file to calculate BMI.  General Appearance: NA  Eye Contact:  NA  Speech:  Clear and Coherent and Normal Rate  Volume:  Normal  Mood:  Anxious  Affect:  NA  Thought Process:  Goal Directed  Orientation:  Full (Time, Place, and Person)  Thought Content:  Logical   Suicidal Thoughts:  No  Homicidal Thoughts:  No  Memory:  Immediate;   Good Recent;   Good Remote;   Good  Judgement:  Good  Insight:  Good  Psychomotor Activity:  NA  Concentration:  Concentration: Good  Recall:  Good  Fund of Knowledge: Good  Language: Good  Akathisia:  Negative  Handed:  Right  AIMS (if indicated): not done  Assets:  Communication Skills Desire for Improvement Housing Resilience Talents/Skills  ADL's:  Intact  Cognition: WNL  Sleep:  Fair   Screenings: PHQ2-9   [place] Office Visit from 09/07/2019 in [facility] Office Visit from 03/18/2016 in [facility]  PHQ-2 Total Score 0 0       Assessment and Plan: 45yo married female with panic disorder, GAD and MDD.She has beendepressed,anxious and having more problems with sleep due to stress related toCOVID. Shereportedthat alprazolamworks better than clonazepamdidso we changed it back.We increased duloxetine and trazodone doses. Alonah now reports feelingless depressedbut still anxious. Her father has been recently diagnosed with  lymphoma which contributes to high stress level. 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 Alprazolam used as neededbut she takes it daily.Trazodone 300 mg taken every night with good effect. No SI, normal appetite, no psychosis.She now goes to pain clinic for chronic back pain (on hydrocodone) and we will try to gradually decrease her use of benzodiazepine.  Dx: Panic disorder; GAD; MDD recurrentin partial remission  Plan:Continue duloxetine 60 mg po qd,alprazolam 1 mg decrease to bid prn for 1 month and then to 5m once daily prn for 1 month and then stop.  We discussed starting therapy to address anxiety and develop coping skills. Patient agreeable.  We discussed the risk of taking trazodone at such high dose along with the alprazolam and leading to increased sedation. Continue  trazodone300 mg at HS for sleep.The plan was discussed with patient who had an opportunity to ask questions and these were all answered. I spend159mutes inphone contact with patient. Next appointment in2m29monthI spent 20 minutes on chart review.   HimElvin SoD 10/23/2020, 9:40 AM

## 2020-10-28 ENCOUNTER — Ambulatory Visit (HOSPITAL_COMMUNITY): Payer: 59

## 2020-10-30 ENCOUNTER — Other Ambulatory Visit: Payer: Self-pay

## 2020-10-30 ENCOUNTER — Ambulatory Visit (HOSPITAL_COMMUNITY)
Admission: RE | Admit: 2020-10-30 | Discharge: 2020-10-30 | Disposition: A | Discharge status: Home / Self Care | Payer: 59 | Source: Ambulatory Visit | Attending: Orthopaedic Surgery | Admitting: Orthopaedic Surgery

## 2020-10-30 DIAGNOSIS — G894 Chronic pain syndrome: Secondary | ICD-10-CM | POA: Diagnosis present

## 2020-10-30 DIAGNOSIS — G8929 Other chronic pain: Secondary | ICD-10-CM | POA: Diagnosis present

## 2020-10-30 DIAGNOSIS — M25562 Pain in left knee: Secondary | ICD-10-CM | POA: Diagnosis not present

## 2020-10-31 ENCOUNTER — Ambulatory Visit: Payer: 59 | Admitting: Orthopaedic Surgery

## 2020-11-05 ENCOUNTER — Ambulatory Visit (INDEPENDENT_AMBULATORY_CARE_PROVIDER_SITE_OTHER): Payer: 59 | Admitting: Orthopaedic Surgery

## 2020-11-05 ENCOUNTER — Other Ambulatory Visit: Payer: Self-pay

## 2020-11-05 ENCOUNTER — Encounter: Payer: Self-pay | Admitting: Orthopaedic Surgery

## 2020-11-05 VITALS — BP 112/73 | HR 82 | Ht 63.0 in | Wt 154.0 lb

## 2020-11-05 DIAGNOSIS — F17210 Nicotine dependence, cigarettes, uncomplicated: Secondary | ICD-10-CM | POA: Diagnosis not present

## 2020-11-05 DIAGNOSIS — M25562 Pain in left knee: Secondary | ICD-10-CM | POA: Diagnosis not present

## 2020-11-05 DIAGNOSIS — G8929 Other chronic pain: Secondary | ICD-10-CM | POA: Diagnosis not present

## 2020-11-05 DIAGNOSIS — G894 Chronic pain syndrome: Secondary | ICD-10-CM

## 2020-11-05 NOTE — Progress Notes (Signed)
Patient TI:RWERX [name], female DOB:06/02/1976, 45 y.o. VQM:[id number]  Chief Complaint  Patient presents with  . Results    MRI results left knee    HPI  Vanessa Bowen is a 45 y.o. female who has left knee pain that is not getting any better.  She had MRI which showed:  IMPRESSION: Degenerative intrasubstance signal at the posterior horn-body junction of the medial meniscus without definitive meniscal tear.  Otherwise unremarkable left knee MRI.  Intact cruciate ligaments.  I have explained the findings to her.  She says the knee is worse.  She cannot walk very much.  She would like to see Dr. Aline Brochure or Dr. Amedeo Kinsman for their opinion.  Also, she pointed out that the MRI report states "  Lateral: The evidence of lateral meniscal tear."  She would like that corrected or explained. I will contact radiology for correction.     Body mass index is 27.28 kg/m.  ROS  Review of Systems  Constitutional: Positive for activity change.  HENT: Negative for congestion.   Respiratory: Negative for cough and shortness of breath.   Cardiovascular: Negative for chest pain and leg swelling.  Endocrine: Positive for cold intolerance.  Musculoskeletal: Positive for arthralgias, back pain, gait problem and joint swelling.  Allergic/Immunologic: Positive for environmental allergies.  Neurological: Positive for headaches.  Psychiatric/Behavioral: The patient is nervous/anxious.   All other systems reviewed and are negative.   All other systems reviewed and are negative.  The following is a summary of the past history medically, past history surgically, known current medicines, social history and family history.  This information is gathered electronically by the computer from prior information and documentation.  I review this each visit and have found including this information at this point in the chart is beneficial and informative.    Past Medical History:  Diagnosis Date  . Anxiety   .  Arthritis   . Chronic back pain   . Colitis, ulcerative ([place])   . Depression   . Hypothyroidism   . Migraine     Past Surgical History:  Procedure Laterality Date  . ANKLE SURGERY    . BREAST ENHANCEMENT SURGERY    . CESAREAN SECTION    . KNEE SURGERY Right    arthroscopy  . LAPAROSCOPIC BILATERAL SALPINGECTOMY  07/27/2012   Procedure: LAPAROSCOPIC BILATERAL SALPINGECTOMY;  Surgeon: Florian Buff, MD;  Location: [facility];  Service: Gynecology;  Laterality: N/A;  . OOPHORECTOMY Right 09/09/2016   Procedure: RIGHT OOPHORECTOMY;  Surgeon: Florian Buff, MD;  Location: [facility];  Service: Gynecology;  Laterality: Right;  . OVARIAN CYST REMOVAL    . VAGINAL HYSTERECTOMY N/A 09/09/2016   Procedure: HYSTERECTOMY VAGINAL;  Surgeon: Florian Buff, MD;  Location: [facility];  Service: Gynecology;  Laterality: N/A;    Family History  Problem Relation Age of Onset  . Cancer Maternal Grandmother   . Cancer Maternal Grandfather   . Alcohol abuse Maternal Grandfather   . Hypothyroidism Daughter   . Bipolar disorder Daughter   . Anxiety disorder Daughter   . Depression Mother   . Alcohol abuse Mother   . Drug abuse Sister   . Anxiety disorder Sister     Social History Social History   Tobacco Use  . Smoking status: Current Every Day Smoker    Packs/day: 0.50    Years: 15.00    Pack years: 7.50    Types: Cigarettes  . Smokeless tobacco: Never Used  Vaping Use  .  Vaping Use: Never used  Substance Use Topics  . Alcohol use: Yes    Comment: Occasionally  . Drug use: No    Allergies  Allergen Reactions  . Doxycycline Nausea And Vomiting  . Sulfonamide Derivatives Nausea And Vomiting    Current Outpatient Medications  Medication Sig Dispense Refill  . ALPRAZolam (XANAX) 1 MG tablet Take 43m po bid prn for anxiety for 1 month, then take 138mdaily as needed for anxiety for 1 month and then discontinue completely. 90 tablet 0  . conjugated estrogens (PREMARIN) vaginal cream USE 1 GRAM  NIGHTLY AS DIRECTED 90 g 4  . DIVIGEL 1 MG/GM GEL PLACE 1 PACKET ONTO THE SKIN DAILY. 1 g 3  . DULoxetine (CYMBALTA) 60 MG capsule Take 1 capsule (60 mg total) by mouth daily. 30 capsule 2  . HYDROcodone-acetaminophen (NORCO/VICODIN) 5-325 MG tablet One tablet by mouth every six hours as needed for pain. 30 days between refills. 60 tablet 0  . ibuprofen (ADVIL,MOTRIN) 200 MG tablet Take 400-600 mg by mouth every 8 (eight) hours as needed (for pain/headaches.).    . Marland Kitchenevothyroxine (SYNTHROID) 112 MCG tablet Take 1 tablet (112 mcg total) by mouth daily before breakfast. 90 tablet 1  . tiZANidine (ZANAFLEX) 4 MG tablet Take 4 mg by mouth every 6 (six) hours as needed for muscle spasms.     . traZODone (DESYREL) 150 MG tablet Take 2 tablets (300 mg total) by mouth at bedtime. 180 tablet 1   No current facility-administered medications for this visit.     Physical Exam  Blood pressure 112/73, pulse 82, height 5' 3"  (1.6 m), weight 154 lb (69.9 kg), last menstrual period 07/24/2016.  Constitutional: overall normal hygiene, normal nutrition, well developed, normal grooming, normal body habitus. Assistive device:left knee sleeve  Musculoskeletal: gait and station Limp left, muscle tone and strength are normal, no tremors or atrophy is present.  .  Neurological: coordination overall normal.  Deep tendon reflex/nerve stretch intact.  Sensation normal.  Cranial nerves II-XII intact.   Skin:   Normal overall no scars, lesions, ulcers or rashes. No psoriasis.  Psychiatric: Alert and oriented x 3.  Recent memory intact, remote memory unclear.  Normal mood and affect. Well groomed.  Good eye contact.  Cardiovascular: overall no swelling, no varicosities, no edema bilaterally, normal temperatures of the legs and arms, no clubbing, cyanosis and good capillary refill.  Lymphatic: palpation is normal.  Left knee is tender, slight effusion, slight crepitus, ROM 0 to 110, medial joint line pain, positive  medial [place], left limp.  All other systems reviewed and are negative   The patient has been educated about the nature of the problem(s) and counseled on treatment options.  The patient appeared to understand what I have discussed and is in agreement with it.  Encounter Diagnoses  Name Primary?  . Chronic pain of left knee Yes  . Chronic pain syndrome   . Cigarette nicotine dependence without complication     PLAN Call if any problems.  Precautions discussed.  Continue current medications.   Return to clinic to see Dr. HaAline Brochurer Dr. CaAmedeo Kinsmanurther about left knee.   Electronically Signed WaSanjuana KavaMD 5/17/20222:13 PM

## 2020-11-06 ENCOUNTER — Ambulatory Visit (HOSPITAL_COMMUNITY): Payer: Medicaid Other | Admitting: Licensed Clinical Social Worker

## 2020-11-08 ENCOUNTER — Ambulatory Visit (INDEPENDENT_AMBULATORY_CARE_PROVIDER_SITE_OTHER): Payer: 59 | Admitting: Orthopedic Surgery

## 2020-11-08 ENCOUNTER — Encounter: Payer: Self-pay | Admitting: Orthopedic Surgery

## 2020-11-08 ENCOUNTER — Other Ambulatory Visit: Payer: Self-pay

## 2020-11-08 VITALS — BP 104/70 | HR 79 | Ht 63.0 in | Wt 150.0 lb

## 2020-11-08 DIAGNOSIS — G8929 Other chronic pain: Secondary | ICD-10-CM | POA: Diagnosis not present

## 2020-11-08 DIAGNOSIS — M25562 Pain in left knee: Secondary | ICD-10-CM

## 2020-11-08 NOTE — Progress Notes (Signed)
New Patient Visit  Assessment: Vanessa Bowen is a 45 y.o. female with the following: Chronic left knee pain without structural damage  Plan: Reviewed radiographs and MRI with patient. Evidence of mild degenerative changes, but no meniscal pathology that can be addressed with surgical intervention.  Discussed ongoing nonoperative treatment options including medications, PT, home exercises and an injection.  She is not interested in an injection.  She prefers home exercises, which were provided for her today. Continue knee sleeve as tolerated.  Follow up if interested in an injection.    Follow-up: Return if symptoms worsen or fail to improve.  Subjective:  Chief Complaint  Patient presents with  . Knee Pain    Lt knee on and off for 4 months but [place] and noticed knots on knee. Pt also states knee "went out" on her later on the zoo trip.     History of Present Illness: Vanessa Bowen is a 45 y.o. female who presents for evaluation of left knee pain.  She has been having pain in her knee for at least 4 months.  No specific injury.  She notes an acute worsening after a trip to the zoo.  Her knee "gave out" but no twisting event.  Occasional swelling.  She is taking narcotics daily.  No mechanical symptoms.  No PT.  Never had an injection.  Pain is in the anterior and anterolateral part of the knee.    Review of Systems: No fevers or chills No numbness or tingling No chest pain No shortness of breath No bowel or bladder dysfunction No GI distress No headaches   Medical History:  Past Medical History:  Diagnosis Date  . Anxiety   . Arthritis   . Chronic back pain   . Colitis, ulcerative ([place])   . Depression   . Hypothyroidism   . Migraine     Past Surgical History:  Procedure Laterality Date  . ANKLE SURGERY    . BREAST ENHANCEMENT SURGERY    . CESAREAN SECTION    . KNEE SURGERY Right    arthroscopy  . LAPAROSCOPIC BILATERAL SALPINGECTOMY  07/27/2012    Procedure: LAPAROSCOPIC BILATERAL SALPINGECTOMY;  Surgeon: Florian Buff, MD;  Location: [facility];  Service: Gynecology;  Laterality: N/A;  . OOPHORECTOMY Right 09/09/2016   Procedure: RIGHT OOPHORECTOMY;  Surgeon: Florian Buff, MD;  Location: [facility];  Service: Gynecology;  Laterality: Right;  . OVARIAN CYST REMOVAL    . VAGINAL HYSTERECTOMY N/A 09/09/2016   Procedure: HYSTERECTOMY VAGINAL;  Surgeon: Florian Buff, MD;  Location: [facility];  Service: Gynecology;  Laterality: N/A;    Family History  Problem Relation Age of Onset  . Cancer Maternal Grandmother   . Cancer Maternal Grandfather   . Alcohol abuse Maternal Grandfather   . Hypothyroidism Daughter   . Bipolar disorder Daughter   . Anxiety disorder Daughter   . Depression Mother   . Alcohol abuse Mother   . Drug abuse Sister   . Anxiety disorder Sister    Social History   Tobacco Use  . Smoking status: Current Every Day Smoker    Packs/day: 0.50    Years: 15.00    Pack years: 7.50    Types: Cigarettes  . Smokeless tobacco: Never Used  Vaping Use  . Vaping Use: Never used  Substance Use Topics  . Alcohol use: Yes    Comment: Occasionally  . Drug use: No    Allergies  Allergen Reactions  .  Doxycycline Nausea And Vomiting  . Sulfonamide Derivatives Nausea And Vomiting    Current Meds  Medication Sig  . ALPRAZolam (XANAX) 1 MG tablet Take 81m po bid prn for anxiety for 1 month, then take 167mdaily as needed for anxiety for 1 month and then discontinue completely.  . conjugated estrogens (PREMARIN) vaginal cream USE 1 GRAM NIGHTLY AS DIRECTED  . DIVIGEL 1 MG/GM GEL PLACE 1 PACKET ONTO THE SKIN DAILY.  . DULoxetine (CYMBALTA) 60 MG capsule Take 1 capsule (60 mg total) by mouth daily.  . Marland KitchenYDROcodone-acetaminophen (NORCO/VICODIN) 5-325 MG tablet One tablet by mouth every six hours as needed for pain. 30 days between refills.  . Marland Kitchenbuprofen (ADVIL,MOTRIN) 200 MG tablet Take 400-600 mg by mouth every 8 (eight) hours as  needed (for pain/headaches.).  . Marland Kitchenevothyroxine (SYNTHROID) 112 MCG tablet Take 1 tablet (112 mcg total) by mouth daily before breakfast.  . tiZANidine (ZANAFLEX) 4 MG tablet Take 4 mg by mouth every 6 (six) hours as needed for muscle spasms.   . traZODone (DESYREL) 150 MG tablet Take 2 tablets (300 mg total) by mouth at bedtime.    Objective: BP 104/70   Pulse 79   Ht 5' 3"  (1.6 m)   Wt 150 lb (68 kg)   LMP 07/24/2016   BMI 26.57 kg/m   Physical Exam:  General: Alert and oriented.  No acute distress Gait:  Left sided antalgic gait.   Evaluation of the left knee demonstrates a mild effusion.  ROM from 0-120. Negative Lachman.  No increased laxity to varus or valgus stress.  Tenderness over the anterolateral knee.  No tenderness along medial or lateral joint line.    IMAGING: I personally reviewed images previously obtained in clinic   XR left knee with mild degenerative changes.  Mild loss of joint space medially. Minimal osteophytes.  MRI of left knee without injury to medial or lateral meniscus. No bone bruising.  Small area of increased signal in fat pad.    New Medications:  No orders of the defined types were placed in this encounter.     MaMordecai RasmussenMD  11/08/2020 10:00 AM

## 2020-11-08 NOTE — Patient Instructions (Signed)
Knee Exercises  Ask your health care provider which exercises are safe for you. Do exercises exactly as told by your health care provider and adjust them as directed. It is normal to feel mild stretching, pulling, tightness, or discomfort as you do these exercises. Stop right away if you feel sudden pain or your pain gets worse. Do not begin these exercises until told by your health care provider.  Stretching and range-of-motion exercises These exercises warm up your muscles and joints and improve the movement and flexibility of your knee. These exercises also help to relieve pain and swelling.  Knee extension, prone 1. Lie on your abdomen (prone position) on a bed. 2. Place your left / right knee just beyond the edge of the surface so your knee is not on the bed. You can put a towel under your left / right thigh just above your kneecap for comfort. 3. Relax your leg muscles and allow gravity to straighten your knee (extension). You should feel a stretch behind your left / right knee. 4. Hold this position for 10 seconds. 5. Scoot up so your knee is supported between repetitions. Repeat 10 times. Complete this exercise 3-4 times per week.     Knee flexion, active 1. Lie on your back with both legs straight. If this causes back discomfort, bend your left / right knee so your foot is flat on the floor. 2. Slowly slide your left / right heel back toward your buttocks. Stop when you feel a gentle stretch in the front of your knee or thigh (flexion). 3. Hold this position for 10 seconds. 4. Slowly slide your left / right heel back to the starting position. Repeat 10 times. Complete this exercise 3-4 times per week.      Quadriceps stretch, prone 1. Lie on your abdomen on a firm surface, such as a bed or padded floor. 2. Bend your left / right knee and hold your ankle. If you cannot reach your ankle or pant leg, loop a belt around your foot and grab the belt instead. 3. Gently pull your heel  toward your buttocks. Your knee should not slide out to the side. You should feel a stretch in the front of your thigh and knee (quadriceps). 4. Hold this position for 10 seconds. Repeat 10 times. Complete this exercise 3-4 times per week.      Hamstring, supine 1. Lie on your back (supine position). 2. Loop a belt or towel over the ball of your left / right foot. The ball of your foot is on the walking surface, right under your toes. 3. Straighten your left / right knee and slowly pull on the belt to raise your leg until you feel a gentle stretch behind your knee (hamstring). ? Do not let your knee bend while you do this. ? Keep your other leg flat on the floor. 4. Hold this position for 10 seconds. Repeat 10 times. Complete this exercise 3-4 times per week.   Strengthening exercises These exercises build strength and endurance in your knee. Endurance is the ability to use your muscles for a long time, even after they get tired.  Quadriceps, isometric This exercise stretches the muscles in front of your thigh (quadriceps) without moving your knee joint (isometric). 1. Lie on your back with your left / right leg extended and your other knee bent. Put a rolled towel or small pillow under your knee if told by your health care provider. 2. Slowly tense the muscles in the  front of your left / right thigh. You should see your kneecap slide up toward your hip or see increased dimpling just above the knee. This motion will push the back of the knee toward the floor. 3. For 10 seconds, hold the muscle as tight as you can without increasing your pain. 4. Relax the muscles slowly and completely. Repeat 10 times. Complete this exercise 3-4 times per week. .     Straight leg raises This exercise stretches the muscles in front of your thigh (quadriceps) and the muscles that move your hips (hip flexors). 1. Lie on your back with your left / right leg extended and your other knee bent. 2. Tense the  muscles in the front of your left / right thigh. You should see your kneecap slide up or see increased dimpling just above the knee. Your thigh may even shake a bit. 3. Keep these muscles tight as you raise your leg 4-6 inches (10-15 cm) off the floor. Do not let your knee bend. 4. Hold this position for 10 seconds. 5. Keep these muscles tense as you lower your leg. 6. Relax your muscles slowly and completely after each repetition. Repeat 10 times. Complete this exercise 3-4 times per week.  Hamstring, isometric 1. Lie on your back on a firm surface. 2. Bend your left / right knee about 30 degrees. 3. Dig your left / right heel into the surface as if you are trying to pull it toward your buttocks. Tighten the muscles in the back of your thighs (hamstring) to "dig" as hard as you can without increasing any pain. 4. Hold this position for 10 seconds. 5. Release the tension gradually and allow your muscles to relax completely for __________ seconds after each repetition. Repeat 10 times. Complete this exercise 3-4 times per week.  Hamstring curls If told by your health care provider, do this exercise while wearing ankle weights. Begin with 5 lb weights. Then increase the weight by 1 lb (0.5 kg) increments. You can also use an exercise band 1. Lie on your abdomen with your legs straight. 2. Bend your left / right knee as far as you can without feeling pain. Keep your hips flat against the floor. 3. Hold this position for 10 seconds. 4. Slowly lower your leg to the starting position. Repeat 10 times. Complete this exercise 3-4 times per week.      Squats This exercise strengthens the muscles in front of your thigh and knee (quadriceps). 1. Stand in front of a table, with your feet and knees pointing straight ahead. You may rest your hands on the table for balance but not for support. 2. Slowly bend your knees and lower your hips like you are going to sit in a chair. ? Keep your weight over  your heels, not over your toes. ? Keep your lower legs upright so they are parallel with the table legs. ? Do not let your hips go lower than your knees. ? Do not bend lower than told by your health care provider. ? If your knee pain increases, do not bend as low. 3. Hold the squat position for 10 seconds. 4. Slowly push with your legs to return to standing. Do not use your hands to pull yourself to standing. Repeat 10 times. Complete this exercise 3-4 times per week .     Wall slides This exercise strengthens the muscles in front of your thigh and knee (quadriceps). 1. Lean your back against a smooth wall or door,  and walk your feet out 18-24 inches (46-61 cm) from it. 2. Place your feet hip-width apart. 3. Slowly slide down the wall or door until your knees bend 90 degrees. Keep your knees over your heels, not over your toes. Keep your knees in line with your hips. 4. Hold this position for 10 seconds. Repeat 10 times. Complete this exercise 3-4 times per week.      Straight leg raises This exercise strengthens the muscles that rotate the leg at the hip and move it away from your body (hip abductors). 1. Lie on your side with your left / right leg in the top position. Lie so your head, shoulder, knee, and hip line up. You may bend your bottom knee to help you keep your balance. 2. Roll your hips slightly forward so your hips are stacked directly over each other and your left / right knee is facing forward. 3. Leading with your heel, lift your top leg 4-6 inches (10-15 cm). You should feel the muscles in your outer hip lifting. ? Do not let your foot drift forward. ? Do not let your knee roll toward the ceiling. 4. Hold this position for 10 seconds. 5. Slowly return your leg to the starting position. 6. Let your muscles relax completely after each repetition. Repeat 10 times. Complete this exercise 3-4 times per week.      Straight leg raises This exercise stretches the muscles that  move your hips away from the front of the pelvis (hip extensors). 1. Lie on your abdomen on a firm surface. You can put a pillow under your hips if that is more comfortable. 2. Tense the muscles in your buttocks and lift your left / right leg about 4-6 inches (10-15 cm). Keep your knee straight as you lift your leg. 3. Hold this position for 10 seconds. 4. Slowly lower your leg to the starting position. 5. Let your leg relax completely after each repetition. Repeat 10 times. Complete this exercise 3-4 times per week.

## 2020-11-20 ENCOUNTER — Other Ambulatory Visit: Payer: Self-pay

## 2020-11-20 ENCOUNTER — Ambulatory Visit (HOSPITAL_COMMUNITY): Payer: Medicaid Other | Admitting: Licensed Clinical Social Worker

## 2020-11-20 ENCOUNTER — Telehealth (HOSPITAL_COMMUNITY): Payer: Self-pay | Admitting: *Deleted

## 2020-11-20 NOTE — Telephone Encounter (Signed)
Placed call to patient to advise of cancelled appointment and need to find psychiatric services from another provider.  Patient informed about letter coming with resources.  Patient is going to her family MD.  She was angry about appt being cancelled and no services.  She cancelled her appt with the therapist as well. No refills needed.

## 2020-11-29 ENCOUNTER — Other Ambulatory Visit (HOSPITAL_COMMUNITY): Payer: Self-pay | Admitting: Internal Medicine

## 2020-11-29 DIAGNOSIS — Z1231 Encounter for screening mammogram for malignant neoplasm of breast: Secondary | ICD-10-CM

## 2020-12-02 ENCOUNTER — Telehealth: Payer: Self-pay | Admitting: Obstetrics & Gynecology

## 2020-12-02 ENCOUNTER — Other Ambulatory Visit: Payer: Self-pay | Admitting: Obstetrics & Gynecology

## 2020-12-02 MED ORDER — DIVIGEL 1 MG/GM TD GEL: TRANSDERMAL | 11 refills | Status: DC

## 2020-12-02 NOTE — Telephone Encounter (Signed)
Pt needs refill on DIVIGEL 1MG/GM GEL  Does pt need to be seen or can we refill?  Pt states the hot flashes have returned & are much worse   Please advise & notify    [facility]

## 2020-12-11 ENCOUNTER — Ambulatory Visit (HOSPITAL_COMMUNITY): Payer: 59

## 2020-12-24 ENCOUNTER — Telehealth (HOSPITAL_COMMUNITY): Payer: Medicaid Other | Admitting: Psychiatry

## 2021-01-01 ENCOUNTER — Other Ambulatory Visit: Payer: Self-pay

## 2021-01-01 ENCOUNTER — Ambulatory Visit (HOSPITAL_COMMUNITY)
Admission: RE | Admit: 2021-01-01 | Discharge: 2021-01-01 | Disposition: A | Discharge status: Home / Self Care | Payer: 59 | Source: Ambulatory Visit | Attending: Internal Medicine | Admitting: Internal Medicine

## 2021-01-01 DIAGNOSIS — Z1231 Encounter for screening mammogram for malignant neoplasm of breast: Secondary | ICD-10-CM

## 2021-01-02 LAB — T4, FREE: Free T4: 1.36 ng/dL (ref 0.82–1.77)

## 2021-01-02 LAB — TSH: TSH: 2.94 u[IU]/mL (ref 0.450–4.500)

## 2021-01-03 ENCOUNTER — Ambulatory Visit: Payer: 59 | Admitting: Nurse Practitioner

## 2021-01-08 ENCOUNTER — Ambulatory Visit: Payer: 59 | Admitting: Nurse Practitioner

## 2021-01-13 NOTE — Patient Instructions (Signed)

## 2021-01-14 ENCOUNTER — Encounter: Payer: Self-pay | Admitting: Nurse Practitioner

## 2021-01-14 ENCOUNTER — Ambulatory Visit (INDEPENDENT_AMBULATORY_CARE_PROVIDER_SITE_OTHER): Payer: 59 | Admitting: Nurse Practitioner

## 2021-01-14 ENCOUNTER — Other Ambulatory Visit: Payer: Self-pay

## 2021-01-14 VITALS — BP 91/64 | HR 84 | Ht 63.0 in | Wt 159.6 lb

## 2021-01-14 DIAGNOSIS — E038 Other specified hypothyroidism: Secondary | ICD-10-CM | POA: Diagnosis not present

## 2021-01-14 MED ORDER — LEVOTHYROXINE SODIUM 112 MCG PO TABS: 112.0000 ug | ORAL_TABLET | Freq: Every day | ORAL | 3 refills | Status: DC

## 2021-01-14 NOTE — Progress Notes (Signed)
Pt experiencing daily headaches for past 2 months.

## 2021-01-14 NOTE — Progress Notes (Signed)
01/14/2021        Endocrinology follow-up note    Subjective:    Patient ID: Vanessa Bowen, female    DOB: 1976/01/16, PCP Redmond School, MD   Past Medical History:  Diagnosis Date   Anxiety    Arthritis    Chronic back pain    Colitis, ulcerative ([place])    Depression    Hypothyroidism    Migraine    Past Surgical History:  Procedure Laterality Date   ANKLE SURGERY     BREAST ENHANCEMENT SURGERY     CESAREAN SECTION     KNEE SURGERY Right    arthroscopy   LAPAROSCOPIC BILATERAL SALPINGECTOMY  07/27/2012   Procedure: LAPAROSCOPIC BILATERAL SALPINGECTOMY;  Surgeon: Florian Buff, MD;  Location: [facility];  Service: Gynecology;  Laterality: N/A;   OOPHORECTOMY Right 09/09/2016   Procedure: RIGHT OOPHORECTOMY;  Surgeon: Florian Buff, MD;  Location: [facility];  Service: Gynecology;  Laterality: Right;   OVARIAN CYST REMOVAL     VAGINAL HYSTERECTOMY N/A 09/09/2016   Procedure: HYSTERECTOMY VAGINAL;  Surgeon: Florian Buff, MD;  Location: [facility];  Service: Gynecology;  Laterality: N/A;   Social History   Socioeconomic History   Marital status: Legally Separated    Spouse name: Not on file   Number of children: 2   Years of education: Not on file   Highest education level: Not on file  Occupational History   Not on file  Tobacco Use   Smoking status: Every Day    Packs/day: 0.50    Years: 15.00    Pack years: 7.50    Types: Cigarettes   Smokeless tobacco: Never  Vaping Use   Vaping Use: Never used  Substance and Sexual Activity   Alcohol use: Yes    Comment: Occasionally   Drug use: No   Sexual activity: Not Currently    Birth control/protection: Surgical    Comment: hyst  Other Topics Concern   Not on file  Social History Narrative   Not on file   Social Determinants of Health   Financial Resource Strain: Not on file  Food Insecurity: Not on file  Transportation Needs: Not on file  Physical Activity: Not on file  Stress: Not on file  Social  Connections: Not on file   Outpatient Encounter Medications as of 01/14/2021  Medication Sig   ALPRAZolam (XANAX) 1 MG tablet Take 67m po bid prn for anxiety for 1 month, then take 187mdaily as needed for anxiety for 1 month and then discontinue completely.   conjugated estrogens (PREMARIN) vaginal cream USE 1 GRAM NIGHTLY AS DIRECTED   DULoxetine (CYMBALTA) 60 MG capsule Take 1 capsule (60 mg total) by mouth daily.   Estradiol (DIVIGEL) 1 MG/GM GEL PLACE 1 PACKET ONTO THE SKIN DAILY.   HYDROcodone-acetaminophen (NORCO/VICODIN) 5-325 MG tablet One tablet by mouth every six hours as needed for pain. 30 days between refills.   ibuprofen (ADVIL,MOTRIN) 200 MG tablet Take 400-600 mg by mouth every 8 (eight) hours as needed (for pain/headaches.).   levothyroxine (SYNTHROID) 112 MCG tablet Take 1 tablet (112 mcg total) by mouth daily before breakfast.   tiZANidine (ZANAFLEX) 4 MG tablet Take 4 mg by mouth every 6 (six) hours as needed for muscle spasms.    traZODone (DESYREL) 150 MG tablet Take 2 tablets (300 mg total) by mouth at bedtime.   [DISCONTINUED] dicyclomine (BENTYL) 20 MG tablet Take 1 tablet (20 mg total) by mouth  3 (three) times daily as needed for up to 30 days for spasms.   [DISCONTINUED] levothyroxine (SYNTHROID) 112 MCG tablet Take 1 tablet (112 mcg total) by mouth daily before breakfast.   No facility-administered encounter medications on file as of 01/14/2021.   ALLERGIES: Allergies  Allergen Reactions   Doxycycline Nausea And Vomiting   Sulfonamide Derivatives Nausea And Vomiting   VACCINATION STATUS:  There is no immunization history on file for this patient.  Thyroid Problem Presents for follow-up visit. Symptoms include anxiety, depressed mood and fatigue. Patient reports no cold intolerance, constipation, diarrhea, heat intolerance, palpitations, tremors or weight loss. 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   45 yr old female with medical history.  She is returning with repeat thyroid function test for follow-up of longstanding hypothyroidism.    She has hypothyroidism diagnosed at approximate age of 89 years.  She is currently on Levothyroxine 112 mcg p.o. every morning.  She reports better consistency taking her medication at this time.  She has no new complaints today.   She denies palpitations, tremors, nor heat/cold intolerance.    Review of systems  Constitutional: + Minimally fluctuating body weight,  current Body mass index is 28.27 kg/m. , + fatigue, no subjective hyperthermia, no subjective hypothermia, daily headache for about 1 month (? Rebound related) Eyes: no blurry vision, no xerophthalmia ENT: no sore throat, no nodules palpated in throat, no dysphagia/odynophagia, no hoarseness Cardiovascular: no chest pain, no shortness of breath, no palpitations, no leg swelling Respiratory: no cough, no shortness of breath Gastrointestinal: no nausea/vomiting/diarrhea Musculoskeletal: no muscle/joint aches Skin: no rashes, no hyperemia Neurological: no tremors, no numbness, no tingling, no dizziness Psychiatric: + depression/anxiety- dealing with great amount of stress with caring for her father who has cancer    Objective:    BP 91/64   Pulse 84   Ht 5' 3"  (1.6 m)   Wt 159 lb 9.6 oz (72.4 kg)   LMP 07/24/2016   BMI 28.27 kg/m   Wt Readings from Last 3 Encounters:  01/14/21 159 lb 9.6 oz (72.4 kg)  11/08/20 150 lb (68 kg)  11/05/20 154 lb (69.9 kg)    BP Readings from Last 3 Encounters:  01/14/21 91/64  11/08/20 104/70  11/05/20 112/73     Physical Exam- Limited  Constitutional:  Body mass index is 28.27 kg/m. , not in acute distress, normal state of mind Eyes:  EOMI, no exophthalmos Neck: Supple Cardiovascular: RRR, no murmurs, rubs, or gallops, no edema Respiratory: Adequate breathing efforts, no crackles, rales, rhonchi, or  wheezing Musculoskeletal: no gross deformities, strength intact in all four extremities, no gross restriction of joint movements Skin:  no rashes, no hyperemia Neurological: no tremor with outstretched hands    CMP     Component Value Date/Time   NA 136 10/09/2019 1442   K 4.0 10/09/2019 1442   CL 101 10/09/2019 1442   CO2 25 10/09/2019 1442   GLUCOSE 123 (H) 10/09/2019 1442   BUN 15 10/09/2019 1442   CREATININE 0.81 10/09/2019 1442   CREATININE 1.05 04/27/2017 1226   CALCIUM 9.6 10/09/2019 1442   PROT 7.6 10/09/2019 1442   ALBUMIN 4.5 10/09/2019 1442   AST 14 (L) 10/09/2019 1442   ALT 14 10/09/2019 1442   ALKPHOS 57 10/09/2019 1442   BILITOT 0.6 10/09/2019 1442   GFRNONAA >60 10/09/2019 1442   GFRAA >60 10/09/2019 1442   Recent Results (  from the past 2160 hour(s))  TSH     Status: None   Collection Time: 01/01/21 11:39 AM  Result Value Ref Range   TSH 2.940 0.450 - 4.500 uIU/mL  T4, free     Status: None   Collection Time: 01/01/21 11:39 AM  Result Value Ref Range   Free T4 1.36 0.82 - 1.77 ng/dL    Assessment & Plan:   1. Hypothyroidism  -Her previsit thyroid function tests are consistent with appropriate hormone replacement.  She is advised to continue Levothyroxine 112 mcg po daily before breakfast.   - We discussed about the correct intake of her thyroid hormone, on empty stomach at fasting, with water, separated by at least 30 minutes from breakfast and other medications,  and separated by more than 4 hours from calcium, iron, multivitamins, acid reflux medications (PPIs). -Patient is made aware of the fact that thyroid hormone replacement is needed for life, dose to be adjusted by periodic monitoring of thyroid function tests.  - I advised patient to maintain close follow up with Redmond School, MD for primary care needs.    I spent 20 minutes in the care of the patient today including review of labs from Thyroid Function, CMP, and other relevant labs ;  imaging/biopsy records (current and previous including abstractions from other facilities); face-to-face time discussing  her lab results and symptoms, medications doses, her options of short and long term treatment based on the latest standards of care / guidelines;   and documenting the encounter.  Vanessa Bowen  participated in the discussions, expressed understanding, and voiced agreement with the above plans.  All questions were answered to her satisfaction. she is encouraged to contact clinic should she have any questions or concerns prior to her return visit.   Follow up plan: Return in about 1 year (around 01/14/2022) for Thyroid follow up, Previsit labs.  Rayetta Pigg, [facility] [address] [place], [place] [address] Phone: [phone] Fax: [phone]  01/14/2021, 10:37 AM

## 2021-02-05 ENCOUNTER — Other Ambulatory Visit: Payer: Self-pay | Admitting: Obstetrics & Gynecology

## 2021-02-26 ENCOUNTER — Encounter: Payer: Self-pay | Admitting: *Deleted

## 2021-07-21 ENCOUNTER — Telehealth: Payer: Self-pay | Admitting: Nurse Practitioner

## 2021-07-21 DIAGNOSIS — E038 Other specified hypothyroidism: Secondary | ICD-10-CM

## 2021-07-21 NOTE — Telephone Encounter (Signed)
Done, labs entered

## 2021-07-21 NOTE — Telephone Encounter (Signed)
Patient states she is having symptoms- she has gained 17 pounds in 2 weeks. She has not changed anything different. She is very tired, hair falling out. She needs her labs updated so she can go get her labs done. Can you place 2 orders? One for now and one for July

## 2021-07-22 LAB — TSH: TSH: 0.368 u[IU]/mL — ABNORMAL LOW (ref 0.450–4.500)

## 2021-07-22 LAB — T4, FREE: Free T4: 1.5 ng/dL (ref 0.82–1.77)

## 2021-07-22 NOTE — Progress Notes (Signed)
I don't usually add the T3 levels as it doesn't really give [place] valuable information since she is on thyroid hormone replacement.  TSH and Free T4 are the only 2 that should be done routinely to help judge if thyroid hormone replacement dosing is accurate.

## 2021-07-22 NOTE — Progress Notes (Signed)
Can they even do an add on test?  Or would she have to go and get blood done again?  Ill order it just this once.

## 2021-07-22 NOTE — Progress Notes (Signed)
Her thyroid labs look great!  They have even improved from 6 months ago.  Based on these labs, I do not believe that her thyroid is the primary cause of her symptoms.  Perhaps she should reach out to her PCP to help identify other potential causes.

## 2021-07-22 NOTE — Addendum Note (Signed)
Addended by: Brita Romp on: 07/22/2021 11:55 AM   Modules accepted: Orders

## 2021-08-05 ENCOUNTER — Other Ambulatory Visit: Payer: Self-pay

## 2021-08-05 ENCOUNTER — Ambulatory Visit (INDEPENDENT_AMBULATORY_CARE_PROVIDER_SITE_OTHER): Payer: Medicaid Other | Admitting: Obstetrics & Gynecology

## 2021-08-05 ENCOUNTER — Encounter: Payer: Self-pay | Admitting: Obstetrics & Gynecology

## 2021-08-05 VITALS — BP 112/70 | HR 82 | Ht 63.0 in | Wt 166.0 lb

## 2021-08-05 DIAGNOSIS — N951 Menopausal and female climacteric states: Secondary | ICD-10-CM

## 2021-08-05 MED ORDER — ESTRADIOL 2 MG PO TABS: 2.0000 mg | ORAL_TABLET | Freq: Every day | ORAL | 11 refills | Status: DC

## 2021-08-05 MED ORDER — PREMARIN 0.625 MG/GM VA CREA: TOPICAL_CREAM | VAGINAL | 4 refills | Status: DC

## 2021-08-05 NOTE — Progress Notes (Signed)
Chief Complaint  Patient presents with   Follow-up    Discuss hormones.       46 y.o. [id number] Patient's last menstrual period was 07/24/2016. The current method of family planning is status post hysterectomy.  Outpatient Encounter Medications as of 08/05/2021  Medication Sig   ALPRAZolam (XANAX) 1 MG tablet Take 51m po bid prn for anxiety for 1 month, then take 198mdaily as needed for anxiety for 1 month and then discontinue completely.   estradiol (ESTRACE) 2 MG tablet Take 1 tablet (2 mg total) by mouth daily.   HYDROcodone-acetaminophen (NORCO/VICODIN) 5-325 MG tablet One tablet by mouth every six hours as needed for pain. 30 days between refills.   ibuprofen (ADVIL,MOTRIN) 200 MG tablet Take 400-600 mg by mouth every 8 (eight) hours as needed (for pain/headaches.).   levothyroxine (SYNTHROID) 112 MCG tablet Take 1 tablet (112 mcg total) by mouth daily before breakfast.   tiZANidine (ZANAFLEX) 4 MG tablet Take 4 mg by mouth every 6 (six) hours as needed for muscle spasms.    traZODone (DESYREL) 150 MG tablet Take 2 tablets (300 mg total) by mouth at bedtime.   [DISCONTINUED] DIVIGEL 1 MG/GM GEL PLACE 1 PACKET ONTO THE SKIN DAILY.   conjugated estrogens (PREMARIN) vaginal cream USE 1 GRAM NIGHTLY AS DIRECTED   DULoxetine (CYMBALTA) 60 MG capsule Take 1 capsule (60 mg total) by mouth daily. (Patient not taking: Reported on 08/05/2021)   [DISCONTINUED] conjugated estrogens (PREMARIN) vaginal cream USE 1 GRAM NIGHTLY AS DIRECTED (Patient not taking: Reported on 08/05/2021)   [DISCONTINUED] dicyclomine (BENTYL) 20 MG tablet Take 1 tablet (20 mg total) by mouth 3 (three) times daily as needed for up to 30 days for spasms.   No facility-administered encounter medications on file as of 08/05/2021.    Subjective Patient is in with increasing vasomotor symptom complaints She relates that she has had recent thyroid testing which is normal She takes 112 mcg of Synthroid I reviewed her  labs and confirmed that she is currently euthyroid  Patient had a vaginal hysterectomy and right salpingo-oophorectomy and 2018 She subsequently had a laparoscopic left oophorectomy in April 2021 for adhesions and left lower quadrant discomfort associated  Since then she has been 9 estrogen replacement therapy in the form of Divigel and Premarin vaginal cream  She has not been seen in our office by any provider since 2 weeks postop from her left oophorectomy  Previously she was then negative personal relationship but states now she is in a very positive 1 and still is unable to have a normal normal sexual response including orgasm  She is also complaining of hair loss over the past 6 months or so She denies a recent COVID infection which might explain her hair loss She does take care of her father and she had a great deal of stress associated with that over the past 18 months or so   Past Medical History:  Diagnosis Date   Anxiety    Arthritis    Chronic back pain    [place]   Depression    Hypothyroidism    Migraine     Past Surgical History:  Procedure Laterality Date   ANKLE SURGERY     BREAST ENHANCEMENT SURGERY     CESAREAN SECTION     KNEE SURGERY Right    arthroscopy   LAPAROSCOPIC BILATERAL SALPINGECTOMY  07/27/2012   Procedure: LAPAROSCOPIC BILATERAL SALPINGECTOMY;  Surgeon: LuFlorian BuffMD;  Location: [facility];  Service: Gynecology;  Laterality: N/A;   OOPHORECTOMY Right 09/09/2016   Procedure: RIGHT OOPHORECTOMY;  Surgeon: Florian Buff, MD;  Location: [facility];  Service: Gynecology;  Laterality: Right;   OVARIAN CYST REMOVAL     VAGINAL HYSTERECTOMY N/A 09/09/2016   Procedure: HYSTERECTOMY VAGINAL;  Surgeon: Florian Buff, MD;  Location: [facility];  Service: Gynecology;  Laterality: N/A;    OB History     Gravida  3   Para  2   Term  2   Preterm      AB  1   Living  2      SAB  1   IAB      Ectopic      Multiple      Live Births   2           Allergies  Allergen Reactions   Doxycycline Nausea And Vomiting   Sulfonamide Derivatives Nausea And Vomiting    Social History   Socioeconomic History   Marital status: Legally Separated    Spouse name: Not on file   Number of children: 2   Years of education: Not on file   Highest education level: Not on file  Occupational History   Not on file  Tobacco Use   Smoking status: Every Day    Packs/day: 0.50    Years: 15.00    Pack years: 7.50    Types: Cigarettes   Smokeless tobacco: Never  Vaping Use   Vaping Use: Never used  Substance and Sexual Activity   Alcohol use: Yes    Comment: Occasionally   Drug use: No   Sexual activity: Not Currently    Birth control/protection: Surgical    Comment: hyst  Other Topics Concern   Not on file  Social History Narrative   Not on file   Social Determinants of Health   Financial Resource Strain: Not on file  Food Insecurity: Not on file  Transportation Needs: Not on file  Physical Activity: Not on file  Stress: Not on file  Social Connections: Not on file    Family History  Problem Relation Age of Onset   Cancer Maternal Grandmother    Cancer Maternal Grandfather    Alcohol abuse Maternal Grandfather    Hypothyroidism Daughter    Bipolar disorder Daughter    Anxiety disorder Daughter    Depression Mother    Alcohol abuse Mother    Drug abuse Sister    Anxiety disorder Sister     Medications:       Current Outpatient Medications:    ALPRAZolam (XANAX) 1 MG tablet, Take 10m po bid prn for anxiety for 1 month, then take 176mdaily as needed for anxiety for 1 month and then discontinue completely., Disp: 90 tablet, Rfl: 0   estradiol (ESTRACE) 2 MG tablet, Take 1 tablet (2 mg total) by mouth daily., Disp: 30 tablet, Rfl: 11   HYDROcodone-acetaminophen (NORCO/VICODIN) 5-325 MG tablet, One tablet by mouth every six hours as needed for pain. 30 days between refills., Disp: 60 tablet, Rfl: 0   ibuprofen  (ADVIL,MOTRIN) 200 MG tablet, Take 400-600 mg by mouth every 8 (eight) hours as needed (for pain/headaches.)., Disp: , Rfl:    levothyroxine (SYNTHROID) 112 MCG tablet, Take 1 tablet (112 mcg total) by mouth daily before breakfast., Disp: 90 tablet, Rfl: 3   tiZANidine (ZANAFLEX) 4 MG tablet, Take 4 mg by mouth every 6 (six) hours as needed for muscle spasms. ,  Disp: , Rfl:    traZODone (DESYREL) 150 MG tablet, Take 2 tablets (300 mg total) by mouth at bedtime., Disp: 180 tablet, Rfl: 1   conjugated estrogens (PREMARIN) vaginal cream, USE 1 GRAM NIGHTLY AS DIRECTED, Disp: 90 g, Rfl: 4   DULoxetine (CYMBALTA) 60 MG capsule, Take 1 capsule (60 mg total) by mouth daily. (Patient not taking: Reported on 08/05/2021), Disp: 30 capsule, Rfl: 2  Objective Blood pressure 112/70, pulse 82, height 5' 3"  (1.6 m), weight 166 lb (75.3 kg), last menstrual period 07/24/2016.  Gen WDWN NAD  Pertinent ROS No burning with urination, frequency or urgency No nausea, vomiting or diarrhea Nor fever chills or other constitutional symptoms   Labs or studies reviewed    Impression Diagnoses this Encounter::   ICD-10-CM   1. Vasomotor symptoms  N95.1       Established relevant diagnosis(es):   Plan/Recommendations: Meds ordered this encounter  Medications   conjugated estrogens (PREMARIN) vaginal cream    Sig: USE 1 GRAM NIGHTLY AS DIRECTED    Dispense:  90 g    Refill:  4   estradiol (ESTRACE) 2 MG tablet    Sig: Take 1 tablet (2 mg total) by mouth daily.    Dispense:  30 tablet    Refill:  11    Labs or Scans Ordered: No orders of the defined types were placed in this encounter.   Management:: Having a significant uptake in her menopausal vasomotor symptoms that is unusual in the setting She is on an appropriate amount of estrogen replacement therapy At this point we will switch her from topical to oral estradiol 2 mg and see if that makes a difference She has not been using the vaginal  estrogen I have encouraged her to resume using it and a prescription is sent for that as well She will be followed up in 3 months to assess response   Follow up Return in about 3 months (around 11/02/2021) for Follow up, with Dr Elonda Husky.      All questions were answered.

## 2021-08-06 ENCOUNTER — Other Ambulatory Visit: Payer: Self-pay

## 2021-08-06 ENCOUNTER — Ambulatory Visit
Admission: EM | Admit: 2021-08-06 | Discharge: 2021-08-06 | Disposition: A | Discharge status: Home / Self Care | Payer: Medicaid Other | Attending: Family Medicine | Admitting: Family Medicine

## 2021-08-06 DIAGNOSIS — J309 Allergic rhinitis, unspecified: Secondary | ICD-10-CM | POA: Diagnosis not present

## 2021-08-06 DIAGNOSIS — Z20828 Contact with and (suspected) exposure to other viral communicable diseases: Secondary | ICD-10-CM

## 2021-08-06 DIAGNOSIS — J3089 Other allergic rhinitis: Secondary | ICD-10-CM

## 2021-08-06 MED ORDER — PREDNISONE 20 MG PO TABS: 40.0000 mg | ORAL_TABLET | Freq: Every day | ORAL | 0 refills | Status: DC

## 2021-08-06 MED ORDER — CETIRIZINE HCL 10 MG PO TABS: 10.0000 mg | ORAL_TABLET | Freq: Every day | ORAL | 2 refills | Status: DC

## 2021-08-06 MED ORDER — FLUTICASONE PROPIONATE 50 MCG/ACT NA SUSP: 1.0000 | Freq: Two times a day (BID) | NASAL | 2 refills | Status: DC

## 2021-08-06 NOTE — ED Provider Notes (Signed)
[facility]    CSN: [id number] Arrival date & time: 08/06/21  1553      History   Chief Complaint Chief Complaint  Patient presents with   Sore Throat    Fever, cough and sore throat    HPI Vanessa Bowen is a 46 y.o. female.   Presenting today with about a week of sneezing, sinus pressure, congestion, bilateral ear pain and pressure, hacking cough.  Denies fever, chills, chest pain, shortness of breath, abdominal pain, nausea vomiting or diarrhea.  Taking nasal sprays, Alka-Seltzer cold and sinus, Advil, Tylenol with minimal long-term relief.  No known sick contacts recently.  COVID testing has been negative at home.  No known history of seasonal allergies.   Past Medical History:  Diagnosis Date   Anxiety    Arthritis    Chronic back pain    Colitis, ulcerative ([place])    Depression    Hypothyroidism    Migraine    Patient Active Problem List   Diagnosis Date Noted   Pelvic pain 10/12/2019   Pelvic pain in female 09/27/2019   Vitamin D deficiency 10/18/2018   Major depressive disorder, recurrent episode, in partial remission ([place]) 08/23/2018   Panic disorder 07/26/2018   GAD (generalized anxiety disorder) 07/26/2018   PTSD (post-traumatic stress disorder) 07/26/2018   Class 1 obesity due to excess calories without serious comorbidity with body mass index (BMI) of 31.0 to 31.9 in adult 05/20/2017   S/P vaginal hysterectomy 09/09/2016   Colitis, ulcerative ([place]) 03/31/2016   Other specified hypothyroidism 03/18/2016   WEIGHT LOSS, RECENT 09/23/2009   NAUSEA WITH VOMITING 09/23/2009   DIARRHEA, ACUTE 09/23/2009   EPIGASTRIC PAIN 09/23/2009   PUD, HX OF 09/23/2009   ULCERATIVE COLITIS, HX OF 09/23/2009   Past Surgical History:  Procedure Laterality Date   ANKLE SURGERY     BREAST ENHANCEMENT SURGERY     CESAREAN SECTION     KNEE SURGERY Right    arthroscopy   LAPAROSCOPIC BILATERAL SALPINGECTOMY  07/27/2012   Procedure: LAPAROSCOPIC BILATERAL  SALPINGECTOMY;  Surgeon: Florian Buff, MD;  Location: [facility];  Service: Gynecology;  Laterality: N/A;   OOPHORECTOMY Right 09/09/2016   Procedure: RIGHT OOPHORECTOMY;  Surgeon: Florian Buff, MD;  Location: [facility];  Service: Gynecology;  Laterality: Right;   OVARIAN CYST REMOVAL     VAGINAL HYSTERECTOMY N/A 09/09/2016   Procedure: HYSTERECTOMY VAGINAL;  Surgeon: Florian Buff, MD;  Location: [facility];  Service: Gynecology;  Laterality: N/A;    OB History     Gravida  3   Para  2   Term  2   Preterm      AB  1   Living  2      SAB  1   IAB      Ectopic      Multiple      Live Births  2            Home Medications    Prior to Admission medications   Medication Sig Start Date End Date Taking? Authorizing Provider  cetirizine (ZYRTEC ALLERGY) 10 MG tablet Take 1 tablet (10 mg total) by mouth daily. 08/06/21  Yes Volney American, PA-C  fluticasone [facility]) 50 MCG/ACT nasal spray Place 1 spray into both nostrils 2 (two) times daily. 08/06/21  Yes Volney American, PA-C  predniSONE (DELTASONE) 20 MG tablet Take 2 tablets (40 mg total) by mouth daily with breakfast. 08/06/21  Yes Volney American,  PA-C  ALPRAZolam (XANAX) 1 MG tablet Take 50m po bid prn for anxiety for 1 month, then take 188mdaily as needed for anxiety for 1 month and then discontinue completely. 10/23/20   RaElvin SoMD  conjugated estrogens (PREMARIN) vaginal cream USE 1 GRAM NIGHTLY AS DIRECTED 08/05/21   EuFlorian BuffMD  DULoxetine (CYMBALTA) 60 MG capsule Take 1 capsule (60 mg total) by mouth daily. Patient not taking: Reported on 08/05/2021 10/23/20 11/22/20  RaElvin SoMD  estradiol (ESTRACE) 2 MG tablet Take 1 tablet (2 mg total) by mouth daily. 08/05/21   EuFlorian BuffMD  HYDROcodone-acetaminophen (NORCO/VICODIN) 5-325 MG tablet One tablet by mouth every six hours as needed for pain. 30 days between refills. 04/02/20   KeSanjuana KavaMD  ibuprofen (ADVIL,MOTRIN) 200 MG  tablet Take 400-600 mg by mouth every 8 (eight) hours as needed (for pain/headaches.).    [provider]  levothyroxine (SYNTHROID) 112 MCG tablet Take 1 tablet (112 mcg total) by mouth daily before breakfast. 01/14/21   ReBrita RompNP  tiZANidine (ZANAFLEX) 4 MG tablet Take 4 mg by mouth every 6 (six) hours as needed for muscle spasms.  08/18/16   [provider]  traZODone (DESYREL) 150 MG tablet Take 2 tablets (300 mg total) by mouth at bedtime. 10/23/20 08/05/21  RaElvin SoMD  dicyclomine (BENTYL) 20 MG tablet Take 1 tablet (20 mg total) by mouth 3 (three) times daily as needed for up to 30 days for spasms. 08/23/18 03/06/19  Pucilowski, OlMarchia BondMD    Family History Family History  Problem Relation Age of Onset   Cancer Maternal Grandmother    Cancer Maternal Grandfather    Alcohol abuse Maternal Grandfather    Hypothyroidism Daughter    Bipolar disorder Daughter    Anxiety disorder Daughter    Depression Mother    Alcohol abuse Mother    Drug abuse Sister    Anxiety disorder Sister     Social History Social History   Tobacco Use   Smoking status: Every Day    Packs/day: 0.50    Years: 15.00    Pack years: 7.50    Types: Cigarettes   Smokeless tobacco: Never  Vaping Use   Vaping Use: Never used  Substance Use Topics   Alcohol use: Yes    Comment: Occasionally   Drug use: No     Allergies   Doxycycline and Sulfonamide derivatives   Review of Systems Review of Systems Per HPI  Physical Exam Triage Vital Signs ED Triage Vitals  Enc Vitals Group     BP 08/06/21 1645 113/77     Pulse Rate 08/06/21 1645 79     Resp 08/06/21 1645 16     Temp 08/06/21 1645 98.6 F (37 C)     Temp Source 08/06/21 1645 Oral     SpO2 08/06/21 1645 94 %     Weight --      Height --      Head Circumference --      Peak Flow --      Pain Score 08/06/21 1649 7     Pain Loc --      Pain Edu? --      Excl. in [place]--    No data found.  Updated  Vital Signs BP 113/77 (BP Location: Right Arm)    Pulse 79    Temp 98.6 F (37 C) (Oral)    Resp 16    LMP  07/24/2016    SpO2 94%   Visual Acuity Right Eye Distance:   Left Eye Distance:   Bilateral Distance:    Right Eye Near:   Left Eye Near:    Bilateral Near:     Physical Exam Vitals and nursing note reviewed.  Constitutional:      Appearance: Normal appearance.  HENT:     Head: Atraumatic.     Right Ear: External ear normal.     Left Ear: External ear normal.     Ears:     Comments: Bilateral middle ear effusion    Nose: Rhinorrhea present.     Mouth/Throat:     Mouth: Mucous membranes are moist.     Pharynx: Posterior oropharyngeal erythema present.  Eyes:     Extraocular Movements: Extraocular movements intact.     Conjunctiva/sclera: Conjunctivae normal.  Cardiovascular:     Rate and Rhythm: Normal rate and regular rhythm.     Heart sounds: Normal heart sounds.  Pulmonary:     Effort: Pulmonary effort is normal.     Breath sounds: Normal breath sounds. No wheezing or rales.  Musculoskeletal:        General: Normal range of motion.     Cervical back: Normal range of motion and neck supple.  Skin:    General: Skin is warm and dry.  Neurological:     Mental Status: She is alert and oriented to person, place, and time.  Psychiatric:        Mood and Affect: Mood normal.        Thought Content: Thought content normal.   UC Treatments / Results  Labs (all labs ordered are listed, but only abnormal results are displayed) Labs Reviewed  COVID-19, FLU A+B NAA   EKG  Radiology No results found.  Procedures Procedures (including critical care time)  Medications Ordered in UC Medications - No data to display  Initial Impression / Assessment and Plan / UC Course  I have reviewed the triage vital signs and the nursing notes.  Pertinent labs & imaging results that were available during my care of the patient were reviewed by me and considered in my medical  decision making (see chart for details).     Suspect symptoms related to uncontrolled seasonal allergies.  We will start allergy regimen with Zyrtec and Flonase, prednisone burst, sinus rinses, supportive home care.  Return for acutely worsening symptoms.  COVID and flu testing pending for rule out.  Final Clinical Impressions(s) / UC Diagnoses   Final diagnoses:  Exposure to the flu  Allergic sinusitis  Seasonal allergic rhinitis due to other allergic trigger   Discharge Instructions   None    ED Prescriptions     Medication Sig Dispense Auth. Provider   predniSONE (DELTASONE) 20 MG tablet Take 2 tablets (40 mg total) by mouth daily with breakfast. 10 tablet Volney American, PA-C   fluticasone [facility]) 50 MCG/ACT nasal spray Place 1 spray into both nostrils 2 (two) times daily. 16 g Volney American, [place]   cetirizine (ZYRTEC ALLERGY) 10 MG tablet Take 1 tablet (10 mg total) by mouth daily. 30 tablet Volney American, [place]      PDMP not reviewed this encounter.   Volney American, [place] 08/06/21 1831

## 2021-08-06 NOTE — ED Triage Notes (Signed)
Patient states about a week ago she started sneezing with some congestion and lightheadedness   Patient states she has pain on both ears, with congestion and cough  Patient states that she took an at home Covid test and it was negative  Patient states she tried some OTC meds without any relief  Denies Fever

## 2021-08-07 LAB — COVID-19, FLU A+B NAA
Influenza A, NAA: NOT DETECTED
Influenza B, NAA: NOT DETECTED
SARS-CoV-2, NAA: NOT DETECTED

## 2021-11-04 ENCOUNTER — Ambulatory Visit: Payer: Medicaid Other | Admitting: Obstetrics & Gynecology

## 2021-11-10 ENCOUNTER — Encounter: Payer: Self-pay | Admitting: Obstetrics & Gynecology

## 2021-11-10 ENCOUNTER — Ambulatory Visit: Payer: Medicaid Other | Admitting: Obstetrics & Gynecology

## 2021-11-10 VITALS — BP 98/68 | HR 75 | Ht 63.0 in | Wt 161.4 lb

## 2021-11-10 DIAGNOSIS — Z90710 Acquired absence of both cervix and uterus: Secondary | ICD-10-CM

## 2021-11-10 DIAGNOSIS — N951 Menopausal and female climacteric states: Secondary | ICD-10-CM

## 2021-11-10 DIAGNOSIS — L732 Hidradenitis suppurativa: Secondary | ICD-10-CM | POA: Diagnosis not present

## 2021-11-10 DIAGNOSIS — R6882 Decreased libido: Secondary | ICD-10-CM | POA: Diagnosis not present

## 2021-11-10 DIAGNOSIS — N941 Unspecified dyspareunia: Secondary | ICD-10-CM

## 2021-11-10 MED ORDER — PREMARIN 0.625 MG/GM VA CREA: TOPICAL_CREAM | VAGINAL | 4 refills | Status: DC

## 2021-11-10 MED ORDER — CLINDAMYCIN PHOSPHATE 1 % EX GEL: Freq: Two times a day (BID) | CUTANEOUS | 11 refills | Status: DC | PRN

## 2021-11-10 MED ORDER — ESTRADIOL 1 MG/GM TD GEL: 1.0000 mg | Freq: Every day | TRANSDERMAL | 11 refills | Status: DC

## 2021-11-10 NOTE — Progress Notes (Unsigned)
WELL-WOMAN EXAMINATION Patient name: Vanessa Bowen MRN [id number]  Date of birth: 1975/10/24 Chief Complaint:   Follow-up  History of Present Illness:   Vanessa Bowen is a 46 y.o. [id number] PM, [place] female being seen today for a annual follow up regarding:   HRT: At her last visit, she had transitioned from the divigel to the pill; however, she did not like the pill.  With medication, rates her vasomotor symptoms 1/10 (previously an 11 prior to meds)  Dyspareunia: This has always been an issue.  Currently using premarin cream doesn't help.  Notes pain with insertion then tolerable.  Notes decreased libido, which is the biggest concern for her.  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  She can usually self-treat with warm compresses and eventually will note drainage.  She has not required I&Ds.  Last mamomgram- 12/2020     09/07/2019   11:56 AM 03/18/2016    3:03 PM  Depression screen PHQ 2/9  Decreased Interest 0 0  Down, Depressed, Hopeless 0 0  PHQ - 2 Score 0 0      Review of Systems:   Pertinent items are noted in HPI Denies any headaches, blurred vision, fatigue, shortness of breath, chest pain, abdominal pain, bowel movements, urination, or intercourse unless otherwise stated above.  Pertinent History Reviewed:  Reviewed past medical,surgical, social and family history.  Reviewed problem list, medications and allergies. Physical Assessment:   Vitals:   11/10/21 1507  BP: 98/68  Pulse: 75  Weight: 161 lb 6.4 oz (73.2 kg)  Height: 5' 3"  (1.6 m)  Body mass index is 28.59 kg/m.        Physical Examination:   General appearance - well appearing, and in no distress  Mental status - alert, oriented to person, place, and time  Psych:  She has a normal mood and affect  Skin - warm and dry, normal color, no suspicious lesions noted  Chest - effort normal, all lung fields clear to auscultation bilaterally  Heart - normal rate and regular rhythm  Neck:  midline  trachea, no thyromegaly or nodules  Breasts - breasts appear normal, no suspicious masses, no skin or nipple changes or  axillary nodes, bilateral implants noted  Abdomen - soft, nontender, nondistended, no masses or organomegaly  Pelvic - VULVA: bilateral scarring noted on inner thigh/labia majora, left labia with 2cm erythematous slightly raised and scaling mass appreciated- no induration, no active drainage, no other lesions noted,  VAGINA: normal appearing vagina with normal color and discharge, no lesions  Uterus and cervix surgically absent,  No abnormalities noted on bimanual exam  Extremities:  No swelling or varicosities noted  Chaperone: Information systems manager     Assessment & Plan:  1) HRT -plan to continue with divigel  2) Dyspareunia, decreased libido -continue with estrogen cream -discussed conservative options -discussed off label use of testosterone gel- reviewed risk/benefits including abnormal LFTs, pt wishes to trial []  Rx sent in plan to f/u in 2-52mo  3) HS -reviewed conservative treatment -Rx for topical clindamycin  No orders of the defined types were placed in this encounter.   Meds:  Meds ordered this encounter  Medications   clindamycin (CLINDAGEL) 1 % gel    Sig: Apply topically 2 (two) times daily as needed. Apply to affected area    Dispense:  30 g    Refill:  11    Follow-up: Return in about 3 months (around 02/10/2022) for Medication follow up.   JAnderson Malta  Nelda Marseille, DO Attending [place], [facility] for [employer], [place]

## 2021-11-11 ENCOUNTER — Telehealth: Payer: Self-pay | Admitting: Obstetrics & Gynecology

## 2021-11-11 ENCOUNTER — Telehealth: Payer: Self-pay

## 2021-11-11 MED ORDER — CLINDAMYCIN PHOSPHATE 1 % EX SOLN: Freq: Two times a day (BID) | CUTANEOUS | 0 refills | Status: DC

## 2021-11-11 MED ORDER — CLINDAMYCIN PHOSPHATE 1 % EX GEL: Freq: Two times a day (BID) | CUTANEOUS | 11 refills | Status: DC | PRN

## 2021-11-11 NOTE — Telephone Encounter (Signed)
Pt called and stated that she needs to talk with someone about her prescriptions.

## 2021-11-11 NOTE — Telephone Encounter (Signed)
Patient calling this morning stating that the scripts that was suppose to be sent yesterday is not showing at the [facility].

## 2021-11-11 NOTE — Telephone Encounter (Signed)
Returned pt's call, two identifiers used. Pt stated that there were 2 prescriptions that needed a PA and that Dr Nelda Marseille had not sent in the testosterone gel that they discussed in office yesterday. Informed pt that Dr Nelda Marseille would be informed and addressed if a PA would be started or an alternative prescribed.

## 2021-11-12 NOTE — Telephone Encounter (Signed)
Pt states she got med all sorted out. I was out of the office yesterday so I didn't see her message until today. Pt voiced understanding. [place]

## 2021-11-14 ENCOUNTER — Other Ambulatory Visit (HOSPITAL_COMMUNITY): Payer: Self-pay

## 2021-12-02 ENCOUNTER — Encounter: Payer: Self-pay | Admitting: Orthopaedic Surgery

## 2021-12-02 ENCOUNTER — Ambulatory Visit: Payer: Medicaid Other | Admitting: Orthopaedic Surgery

## 2021-12-02 VITALS — BP 108/74 | HR 77 | Ht 63.0 in | Wt 160.0 lb

## 2021-12-02 DIAGNOSIS — M545 Low back pain, unspecified: Secondary | ICD-10-CM | POA: Diagnosis not present

## 2021-12-02 DIAGNOSIS — M25562 Pain in left knee: Secondary | ICD-10-CM | POA: Diagnosis not present

## 2021-12-02 DIAGNOSIS — G894 Chronic pain syndrome: Secondary | ICD-10-CM

## 2021-12-02 DIAGNOSIS — G8929 Other chronic pain: Secondary | ICD-10-CM | POA: Diagnosis not present

## 2021-12-02 MED ORDER — PREDNISONE 5 MG (21) PO TBPK
ORAL_TABLET | ORAL | 0 refills | Status: DC
Start: 2021-12-02 — End: 2021-12-30

## 2021-12-02 NOTE — Progress Notes (Signed)
My knee is hurting again.  She has increased pain to the left knee with swelling and popping and no giving way.  She saw Dr. Amedeo Kinsman and I have reviewed his notes. She does not want an injection.  She has tried rest, ice, Advil.  That does not help.  She does not want to consider any type of surgery if offered.  I did not offer that.  She also has chronic back pain and has had some problems with them.  I will get her records and review.  The left knee has effusion, crepitus, ROM 0 to 105, limp left, stable, NV intact, no distal edema.  Encounter Diagnoses  Name Primary?   Chronic pain of left knee Yes   Chronic pain syndrome    Chronic midline low back pain without sciatica    Get release signed for records.  I will call in prednisone dose pack.  Return in one month.  Call if any problem.  Precautions discussed.  Electronically Signed Sanjuana Kava, MD 6/13/20233:11 PM

## 2021-12-11 ENCOUNTER — Other Ambulatory Visit: Payer: Self-pay | Admitting: *Deleted

## 2021-12-11 ENCOUNTER — Telehealth: Payer: Self-pay | Admitting: Obstetrics & Gynecology

## 2021-12-11 DIAGNOSIS — N951 Menopausal and female climacteric states: Secondary | ICD-10-CM

## 2021-12-11 DIAGNOSIS — L732 Hidradenitis suppurativa: Secondary | ICD-10-CM

## 2021-12-11 MED ORDER — ESTRADIOL 1 MG/GM TD GEL: 1.0000 mg | Freq: Every day | TRANSDERMAL | 11 refills | Status: DC

## 2021-12-11 MED ORDER — CLINDAMYCIN PHOSPHATE 1 % EX SOLN: Freq: Two times a day (BID) | CUTANEOUS | 0 refills | Status: DC

## 2021-12-11 NOTE — Telephone Encounter (Signed)
Patient calling stating that [facility] is suppose to be send a refill request for cleocin 1% external solution she is out and wants to know if another script could be sent in. And she is running low on divigel (estrogen gel) for hot flashes she said. samples if we have any

## 2021-12-16 ENCOUNTER — Encounter: Payer: Self-pay | Admitting: *Deleted

## 2021-12-16 ENCOUNTER — Other Ambulatory Visit: Payer: Self-pay | Admitting: Nurse Practitioner

## 2021-12-16 DIAGNOSIS — E038 Other specified hypothyroidism: Secondary | ICD-10-CM

## 2021-12-30 ENCOUNTER — Ambulatory Visit (INDEPENDENT_AMBULATORY_CARE_PROVIDER_SITE_OTHER): Payer: Medicaid Other

## 2021-12-30 ENCOUNTER — Ambulatory Visit (INDEPENDENT_AMBULATORY_CARE_PROVIDER_SITE_OTHER): Payer: Medicaid Other | Admitting: Orthopaedic Surgery

## 2021-12-30 ENCOUNTER — Telehealth: Payer: Self-pay | Admitting: Orthopaedic Surgery

## 2021-12-30 ENCOUNTER — Encounter: Payer: Self-pay | Admitting: Orthopaedic Surgery

## 2021-12-30 VITALS — BP 129/69 | HR 102 | Ht 63.0 in | Wt 150.0 lb

## 2021-12-30 DIAGNOSIS — M545 Low back pain, unspecified: Secondary | ICD-10-CM

## 2021-12-30 DIAGNOSIS — G8929 Other chronic pain: Secondary | ICD-10-CM

## 2021-12-30 NOTE — Patient Instructions (Signed)
Steps to Quit Smoking Smoking tobacco is the leading cause of preventable death. It can affect almost every organ in the body. Smoking puts you and people around you at risk for many serious, long-lasting (chronic) diseases. Quitting smoking can be hard, but it is one of the best things that you can do for your health. It is never too late to quit. Do not give up if you cannot quit the first time. Some people need to try many times to quit. Do your best to stick to your quit plan, and talk with your doctor if you have any questions or concerns. How do I get ready to quit? Pick a date to quit. Set a date within the next 2 weeks to give you time to prepare. Write down the reasons why you are quitting. Keep this list in places where you will see it often. Tell your family, friends, and co-workers that you are quitting. Their support is important. Talk with your doctor about the choices that may help you quit. Find out if your health insurance will pay for these treatments. Know the people, places, things, and activities that make you want to smoke (triggers). Avoid them. What first steps can I take to quit smoking? Throw away all cigarettes at home, at work, and in your car. Throw away the things that you use when you smoke, such as ashtrays and lighters. Clean your car. Empty the ashtray. Clean your home, including curtains and carpets. What can I do to help me quit smoking? Talk with your doctor about taking medicines and seeing a counselor. You are more likely to succeed when you do both. If you are pregnant or breastfeeding: Talk with your doctor about counseling or other ways to quit smoking. Do not take medicine to help you quit smoking unless your doctor tells you to. Quit right away Quit smoking completely, instead of slowly cutting back on how much you smoke over a period of time. Stopping smoking right away may be more successful than slowly quitting. Go to counseling. In-person is best  if this is an option. You are more likely to quit if you go to counseling sessions regularly. Take medicine You may take medicines to help you quit. Some medicines need a prescription, and some you can buy over-the-counter. Some medicines may contain a drug called nicotine to replace the nicotine in cigarettes. Medicines may: Help you stop having the desire to smoke (cravings). Help to stop the problems that come when you stop smoking (withdrawal symptoms). Your doctor may ask you to use: Nicotine patches, gum, or lozenges. Nicotine inhalers or sprays. Non-nicotine medicine that you take by mouth. Find resources Find resources and other ways to help you quit smoking and remain smoke-free after you quit. They include: Online chats with a Social worker. Phone quitlines. Printed Furniture conservator/restorer. Support groups or group counseling. Text messaging programs. Mobile phone apps. Use apps on your mobile phone or tablet that can help you stick to your quit plan. Examples of free services include Quit Guide from the CDC and smokefree.gov  What can I do to make it easier to quit?  Talk to your family and friends. Ask them to support and encourage you. Call a phone quitline, such as 1-800-QUIT-NOW, reach out to support groups, or work with a Social worker. Ask people who smoke to not smoke around you. Avoid places that make you want to smoke, such as: Bars. Parties. Smoke-break areas at work. Spend time with people who do not smoke. Lower  the stress in your life. Stress can make you want to smoke. Try these things to lower stress: Getting regular exercise. Doing deep-breathing exercises. Doing yoga. Meditating. What benefits will I see if I quit smoking? Over time, you may have: A better sense of smell and taste. Less coughing and sore throat. A slower heart rate. Lower blood pressure. Clearer skin. Better breathing. Fewer sick days. Summary Quitting smoking can be hard, but it is one of  the best things that you can do for your health. Do not give up if you cannot quit the first time. Some people need to try many times to quit. When you decide to quit smoking, make a plan to help you succeed. Quit smoking right away, not slowly over a period of time. When you start quitting, get help and support to keep you smoke-free. This information is not intended to replace advice given to you by your health care provider. Make sure you discuss any questions you have with your health care provider. Document Revised: 05/30/2021 Document Reviewed: 05/30/2021 Elsevier Patient Education  [place].

## 2021-12-30 NOTE — Progress Notes (Signed)
My back hurts more.  Her lower back is more painful.  She has no new trauma, no weakness, no numbness.  She has been going to pain clinic and I need their notes.  Spine/Pelvis examination:  Inspection:  Overall, sacoiliac joint benign and hips nontender; without crepitus or defects.   Thoracic spine inspection: Alignment normal without kyphosis present   Lumbar spine inspection:  Alignment  with normal lumbar lordosis, without scoliosis apparent.   Thoracic spine palpation:  without tenderness of spinal processes   Lumbar spine palpation: without tenderness of lumbar area; without tightness of lumbar muscles    Range of Motion:   Lumbar flexion, forward flexion is normal without pain or tenderness    Lumbar extension is full without pain or tenderness   Left lateral bend is normal without pain or tenderness   Right lateral bend is normal without pain or tenderness   Straight leg raising is normal  Strength & tone: normal   Stability overall normal stability  Encounter Diagnosis  Name Primary?   Chronic midline low back pain without sciatica Yes   X-rays were done of the lumbar spine, reported separately.  I will get MRI as she is not improving and had last MRI in 20290 of the lumbar spine.  Return in three weeks.  Call if any problem.  Precautions discussed.  Electronically Signed Sanjuana Kava, MD 7/11/202310:44 AM

## 2021-12-30 NOTE — Telephone Encounter (Signed)
Called [facility], ph# [phone], to follow up on request authorization which patient states Abigail Butts faxed at time of last visit 12/02/21; reached voice mail, left message.

## 2021-12-30 NOTE — Addendum Note (Signed)
Addended by: Derek Mound A on: 12/30/2021 11:20 AM   Modules accepted: Orders

## 2022-01-12 ENCOUNTER — Telehealth: Payer: Self-pay | Admitting: Nurse Practitioner

## 2022-01-12 NOTE — Telephone Encounter (Signed)
Can you update her lab order for me, please

## 2022-01-13 LAB — T4, FREE: Free T4: 1.91 ng/dL — ABNORMAL HIGH (ref 0.82–1.77)

## 2022-01-13 LAB — TSH: TSH: 0.163 u[IU]/mL — ABNORMAL LOW (ref 0.450–4.500)

## 2022-01-15 ENCOUNTER — Ambulatory Visit: Payer: 59 | Admitting: Nurse Practitioner

## 2022-01-19 ENCOUNTER — Encounter: Payer: Self-pay | Admitting: Nurse Practitioner

## 2022-01-19 ENCOUNTER — Ambulatory Visit (INDEPENDENT_AMBULATORY_CARE_PROVIDER_SITE_OTHER): Payer: Medicaid Other | Admitting: Nurse Practitioner

## 2022-01-19 VITALS — BP 102/71 | HR 85 | Ht 63.0 in | Wt 150.0 lb

## 2022-01-19 DIAGNOSIS — E038 Other specified hypothyroidism: Secondary | ICD-10-CM | POA: Diagnosis not present

## 2022-01-19 MED ORDER — LEVOTHYROXINE SODIUM 112 MCG PO TABS
112.0000 ug | ORAL_TABLET | ORAL | 3 refills | Status: DC
Start: 2022-01-19 — End: 2022-04-21

## 2022-01-19 MED ORDER — LEVOTHYROXINE SODIUM 100 MCG PO TABS: 100.0000 ug | ORAL_TABLET | ORAL | 3 refills | Status: DC

## 2022-01-19 NOTE — Progress Notes (Signed)
01/19/2022        Endocrinology follow-up note    Subjective:    Patient ID: Vanessa Bowen, female    DOB: 11/19/1975, PCP Redmond School, MD   Past Medical History:  Diagnosis Date   Anxiety    Arthritis    Chronic back pain    Colitis, ulcerative ([place])    Depression    Hypothyroidism    Migraine    Past Surgical History:  Procedure Laterality Date   ANKLE SURGERY     BREAST ENHANCEMENT SURGERY     CESAREAN SECTION     KNEE SURGERY Right    arthroscopy   LAPAROSCOPIC BILATERAL SALPINGECTOMY  07/27/2012   Procedure: LAPAROSCOPIC BILATERAL SALPINGECTOMY;  Surgeon: Florian Buff, MD;  Location: [facility];  Service: Gynecology;  Laterality: N/A;   OOPHORECTOMY Right 09/09/2016   Procedure: RIGHT OOPHORECTOMY;  Surgeon: Florian Buff, MD;  Location: [facility];  Service: Gynecology;  Laterality: Right;   OVARIAN CYST REMOVAL     VAGINAL HYSTERECTOMY N/A 09/09/2016   Procedure: HYSTERECTOMY VAGINAL;  Surgeon: Florian Buff, MD;  Location: [facility];  Service: Gynecology;  Laterality: N/A;   Social History   Socioeconomic History   Marital status: Legally Separated    Spouse name: Not on file   Number of children: 2   Years of education: Not on file   Highest education level: Not on file  Occupational History   Not on file  Tobacco Use   Smoking status: Every Day    Packs/day: 0.50    Years: 15.00    Total pack years: 7.50    Types: Cigarettes   Smokeless tobacco: Never  Vaping Use   Vaping Use: Never used  Substance and Sexual Activity   Alcohol use: Yes    Comment: Occasionally   Drug use: No   Sexual activity: Not Currently    Birth control/protection: Surgical    Comment: hyst  Other Topics Concern   Not on file  Social History Narrative   Not on file   Social Determinants of Health   Financial Resource Strain: Not on file  Food Insecurity: Not on file  Transportation Needs: Not on file  Physical Activity: Not on file  Stress: Not on file  Social  Connections: Not on file   Outpatient Encounter Medications as of 01/19/2022  Medication Sig   levothyroxine (SYNTHROID) 100 MCG tablet Take 1 tablet (100 mcg total) by mouth every other day.   ALPRAZolam (XANAX) 1 MG tablet Take 65m po bid prn for anxiety for 1 month, then take 147mdaily as needed for anxiety for 1 month and then discontinue completely.   cetirizine (ZYRTEC ALLERGY) 10 MG tablet Take 1 tablet (10 mg total) by mouth daily.   clindamycin (CLEOCIN-T) 1 % external solution Apply topically 2 (two) times daily.   clindamycin (CLINDAGEL) 1 % gel Apply topically 2 (two) times daily as needed. Apply to affected area   conjugated estrogens (PREMARIN) vaginal cream USE 1 GRAM twice per week   Estradiol (DIVIGEL) 1 MG/GM GEL Place 1 mg onto the skin daily.   fluticasone (FLONASE) 50 MCG/ACT nasal spray Place 1 spray into both nostrils 2 (two) times daily.   HYDROcodone-acetaminophen (NORCO) 7.5-325 MG tablet Take 1 tablet by mouth every 6 (six) hours.   ibuprofen (ADVIL,MOTRIN) 200 MG tablet Take 400-600 mg by mouth every 8 (eight) hours as needed (for pain/headaches.).   levothyroxine (SYNTHROID) 112 MCG tablet Take  1 tablet (112 mcg total) by mouth every other day.   MOVANTIK 25 MG TABS tablet Take 25 mg by mouth daily.   tiZANidine (ZANAFLEX) 4 MG tablet Take 4 mg by mouth every 6 (six) hours as needed for muscle spasms.    traZODone (DESYREL) 150 MG tablet Take 2 tablets (300 mg total) by mouth at bedtime.   [DISCONTINUED] dicyclomine (BENTYL) 20 MG tablet Take 1 tablet (20 mg total) by mouth 3 (three) times daily as needed for up to 30 days for spasms.   [DISCONTINUED] levothyroxine (SYNTHROID) 112 MCG tablet Take 1 tablet (112 mcg total) by mouth daily before breakfast.   No facility-administered encounter medications on file as of 01/19/2022.   ALLERGIES: Allergies  Allergen Reactions   Doxycycline Nausea And Vomiting   Sulfonamide Derivatives Nausea And Vomiting   VACCINATION  STATUS:  There is no immunization history on file for this patient.  Thyroid Problem Presents for follow-up visit. Symptoms include anxiety, depressed mood, fatigue, palpitations and weight loss (intentional). Patient reports no cold intolerance, constipation, diarrhea, heat intolerance or tremors. 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    46 yr old female with medical history.  She is returning with repeat thyroid function test for follow-up of longstanding hypothyroidism.    She has hypothyroidism diagnosed at approximate age of 28 years.  She is currently on Levothyroxine 112 mcg p.o. every morning.  She reports better consistency taking her medication at this time.  She has no new complaints today.   She denies palpitations, tremors, nor heat/cold intolerance.    Review of systems  Constitutional: + decreasing body weight-intentional,  current Body mass index is 26.57 kg/m. , + fatigue, no subjective hyperthermia, no subjective hypothermia Eyes: no blurry vision, no xerophthalmia ENT: no sore throat, no nodules palpated in throat, no dysphagia/odynophagia, no hoarseness Cardiovascular: no chest pain, no shortness of breath, + palpitations, no leg swelling Respiratory: no cough, no shortness of breath Gastrointestinal: no nausea/vomiting/diarrhea Musculoskeletal: no muscle/joint aches Skin: no rashes, no hyperemia Neurological: no tremors, no numbness, no tingling, no dizziness Psychiatric: + anxiety- controlled on meds    Objective:    BP 102/71   Pulse 85   Ht 5' 3"  (1.6 m)   Wt 150 lb (68 kg)   LMP 07/24/2016   BMI 26.57 kg/m   Wt Readings from Last 3 Encounters:  01/19/22 150 lb (68 kg)  12/30/21 150 lb (68 kg)  12/02/21 160 lb (72.6 kg)    BP Readings from Last 3 Encounters:  01/19/22 102/71  12/30/21 129/69  12/02/21 108/74     Physical Exam- Limited  Constitutional:   Body mass index is 26.57 kg/m. , not in acute distress, normal state of mind Eyes:  EOMI, no exophthalmos Neck: Supple Cardiovascular: RRR, no murmurs, rubs, or gallops, no edema Respiratory: Adequate breathing efforts, no crackles, rales, rhonchi, or wheezing Musculoskeletal: no gross deformities, strength intact in all four extremities, no gross restriction of joint movements Skin:  no rashes, no hyperemia Neurological: no tremor with outstretched hands    CMP     Component Value Date/Time   NA 136 10/09/2019 1442   K 4.0 10/09/2019 1442   CL 101 10/09/2019 1442   CO2 25 10/09/2019 1442   GLUCOSE 123 (H) 10/09/2019 1442   BUN 15 10/09/2019 1442   CREATININE 0.81 10/09/2019 1442   CREATININE 1.05 04/27/2017 1226   CALCIUM 9.6  10/09/2019 1442   PROT 7.6 10/09/2019 1442   ALBUMIN 4.5 10/09/2019 1442   AST 14 (L) 10/09/2019 1442   ALT 14 10/09/2019 1442   ALKPHOS 57 10/09/2019 1442   BILITOT 0.6 10/09/2019 1442   GFRNONAA >60 10/09/2019 1442   GFRAA >60 10/09/2019 1442   Recent Results (from the past 2160 hour(s))  T4, free     Status: Abnormal   Collection Time: 01/12/22 10:55 AM  Result Value Ref Range   Free T4 1.91 (H) 0.82 - 1.77 ng/dL  TSH     Status: Abnormal   Collection Time: 01/12/22 10:55 AM  Result Value Ref Range   TSH 0.163 (L) 0.450 - 4.500 uIU/mL    Latest Reference Range & Units 01/01/21 11:39 07/21/21 12:06 01/12/22 10:55  TSH 0.450 - 4.500 uIU/mL 2.940 0.368 (L) 0.163 (L)  T4,Free(Direct) 0.82 - 1.77 ng/dL 1.36 1.50 1.91 (H)  (L): Data is abnormally low (H): Data is abnormally high Assessment & Plan:   1. Hypothyroidism  -Her previsit thyroid function tests are consistent with slight over-replacement.  She is advised to continue Levothyroxine 112 mcg po and 100 mcg po on alternating days before breakfast.   - We discussed about the correct intake of her thyroid hormone, on empty stomach at fasting, with water, separated by at least 30 minutes from  breakfast and other medications,  and separated by more than 4 hours from calcium, iron, multivitamins, acid reflux medications (PPIs). -Patient is made aware of the fact that thyroid hormone replacement is needed for life, dose to be adjusted by periodic monitoring of thyroid function tests.  - I advised patient to maintain close follow up with Redmond School, MD for primary care needs.     I spent 30 minutes in the care of the patient today including review of labs from Thyroid Function, CMP, and other relevant labs ; imaging/biopsy records (current and previous including abstractions from other facilities); face-to-face time discussing  her lab results and symptoms, medications doses, her options of short and long term treatment based on the latest standards of care / guidelines;   and documenting the encounter.  Vanessa Bowen  participated in the discussions, expressed understanding, and voiced agreement with the above plans.  All questions were answered to her satisfaction. she is encouraged to contact clinic should she have any questions or concerns prior to her return visit.   Follow up plan: Return in about 3 months (around 04/21/2022) for Thyroid follow up, Previsit labs.  Rayetta Pigg, [facility] [address] [place], [place] [address] Phone: [phone] Fax: [phone]  01/19/2022, 11:02 AM

## 2022-01-19 NOTE — Patient Instructions (Signed)

## 2022-01-20 ENCOUNTER — Encounter: Payer: Self-pay | Admitting: Orthopaedic Surgery

## 2022-01-20 ENCOUNTER — Ambulatory Visit: Payer: Medicaid Other | Admitting: Orthopaedic Surgery

## 2022-01-20 DIAGNOSIS — G894 Chronic pain syndrome: Secondary | ICD-10-CM | POA: Diagnosis not present

## 2022-01-20 DIAGNOSIS — G8929 Other chronic pain: Secondary | ICD-10-CM | POA: Diagnosis not present

## 2022-01-20 DIAGNOSIS — M545 Low back pain, unspecified: Secondary | ICD-10-CM | POA: Diagnosis not present

## 2022-01-20 DIAGNOSIS — F17210 Nicotine dependence, cigarettes, uncomplicated: Secondary | ICD-10-CM

## 2022-01-20 NOTE — Progress Notes (Signed)
I still hurt.  She had MRI of the lumbar spine at [facility].  Report conclusion was normal MRI of lumbar spine.  I have reviewed the report.  She is to have major dental work August 11.  She had brought 163 pages of notes from the pain clinic for my review.  I will need time to read these.  She wants me to resume pain management and stop the pain clinic.  She has pain medicine that will last until September 8.  I will see her back here September 5.  Spine/Pelvis examination:  Inspection:  Overall, sacoiliac joint benign and hips nontender; without crepitus or defects.   Thoracic spine inspection: Alignment normal without kyphosis present   Lumbar spine inspection:  Alignment  with normal lumbar lordosis, without scoliosis apparent.   Thoracic spine palpation:  without tenderness of spinal processes   Lumbar spine palpation: without tenderness of lumbar area; without tightness of lumbar muscles    Range of Motion:   Lumbar flexion, forward flexion is normal without pain or tenderness    Lumbar extension is full without pain or tenderness   Left lateral bend is normal without pain or tenderness   Right lateral bend is normal without pain or tenderness   Straight leg raising is normal  Strength & tone: normal   Stability overall normal stability  Encounter Diagnoses  Name Primary?   Chronic midline low back pain without sciatica Yes   Cigarette nicotine dependence without complication    Chronic pain syndrome    Return September 5.  Continue present medicine.  Call if any problem.  Precautions discussed.  Electronically Signed Sanjuana Kava, MD 8/1/202310:12 AM

## 2022-01-26 NOTE — H&P (Signed)
  Patient: Vanessa Bowen  PID: [id number]  DOB: 25-Nov-1975  SEX: Female   Patient referred by DDS for extraction all remaining teeth  CC: Sensitive to cold.  Past Medical History:  Smoker, Thyroid trouble, Degenerative Disk Disease, Pain management    Medications: Levothyroxine, Hydrocodone, Alprazolam, Movantik    Allergies:     Sulfa, Doxycycline    Surgeries:   Hysterectomy, knee surgery, Ankle surgery, Tubal ligation, Oral Surgery     Social History       Smoking: 1 ppd           Alcohol: Drug use:                             Exam: BMI 27. Multiple caries # 3, 6, 8, 9, 10, 11, 12, 13, 14, 20, 21, 22, 23, 24, 25, 26, 27, 28, 29,  bilateral mandibular lingual tori.  No purulence, edema, fluctuance, trismus. Oral cancer screening negative. Pharynx clear. No lymphadenopathy.  Panorex:Multiple caries # 3, 6, 8, 9, 10, 11, 12, 13, 14, 20, 21, 22, 23, 24, 25, 26, 27, 28, 29,   Assessment: ASA 2. Non-restorable  teeth  # 3, 6, 8, 9, 10, 11, 12, 13, 14, 20, 21, 22, 23, 24, 25, 26, 27, 28, 29, bilateral mandibular lingual tori.             Plan: Extraction Teeth # 3, 6, 8, 9, 10, 11, 12, 13, 14, 20, 21, 22, 23, 24, 25, 26, 27, 28, 29, alveoloplasty, removal bilateral mandibular lingual tori.       Hospital Day surgery.                 Rx: n               Risks and complications explained. Questions answered.   Gae Bon, DMD

## 2022-01-27 ENCOUNTER — Ambulatory Visit: Payer: Medicaid Other | Admitting: Obstetrics & Gynecology

## 2022-01-29 ENCOUNTER — Other Ambulatory Visit: Payer: Self-pay

## 2022-01-29 ENCOUNTER — Encounter (HOSPITAL_COMMUNITY): Payer: Self-pay | Admitting: Oral Surgery

## 2022-01-29 NOTE — Progress Notes (Signed)
Spoke with pt for pre-op call. Pt denies cardiac history, HTN or Diabetes.   Shower instructions given to pt and she voiced understanding.

## 2022-01-30 ENCOUNTER — Encounter (HOSPITAL_COMMUNITY)
Admission: RE | Disposition: A | Discharge status: Home / Self Care | Payer: Self-pay | Source: Home / Self Care | Attending: Oral Surgery

## 2022-01-30 ENCOUNTER — Encounter (HOSPITAL_COMMUNITY): Payer: Self-pay | Admitting: Oral Surgery

## 2022-01-30 ENCOUNTER — Other Ambulatory Visit: Payer: Self-pay

## 2022-01-30 ENCOUNTER — Ambulatory Visit (HOSPITAL_COMMUNITY): Payer: Medicaid Other | Admitting: Certified Registered"

## 2022-01-30 ENCOUNTER — Ambulatory Visit (HOSPITAL_COMMUNITY)
Admission: RE | Admit: 2022-01-30 | Discharge: 2022-01-30 | Disposition: A | Discharge status: Home / Self Care | Payer: Medicaid Other | Attending: Oral Surgery | Admitting: Oral Surgery

## 2022-01-30 ENCOUNTER — Ambulatory Visit (HOSPITAL_BASED_OUTPATIENT_CLINIC_OR_DEPARTMENT_OTHER): Payer: Medicaid Other | Admitting: Certified Registered"

## 2022-01-30 DIAGNOSIS — M278 Other specified diseases of jaws: Secondary | ICD-10-CM | POA: Diagnosis not present

## 2022-01-30 DIAGNOSIS — K029 Dental caries, unspecified: Secondary | ICD-10-CM | POA: Diagnosis not present

## 2022-01-30 DIAGNOSIS — E039 Hypothyroidism, unspecified: Secondary | ICD-10-CM | POA: Insufficient documentation

## 2022-01-30 DIAGNOSIS — K023 Arrested dental caries: Secondary | ICD-10-CM | POA: Diagnosis not present

## 2022-01-30 DIAGNOSIS — F17200 Nicotine dependence, unspecified, uncomplicated: Secondary | ICD-10-CM | POA: Insufficient documentation

## 2022-01-30 DIAGNOSIS — M270 Developmental disorders of jaws: Secondary | ICD-10-CM | POA: Insufficient documentation

## 2022-01-30 DIAGNOSIS — K0889 Other specified disorders of teeth and supporting structures: Secondary | ICD-10-CM

## 2022-01-30 HISTORY — DX: Pneumonia, unspecified organism: J18.9

## 2022-01-30 HISTORY — DX: Personal history of urinary calculi: Z87.442

## 2022-01-30 HISTORY — PX: TOOTH EXTRACTION: SHX859

## 2022-01-30 LAB — CBC
HCT: 41.8 % (ref 36.0–46.0)
Hemoglobin: 14.2 g/dL (ref 12.0–15.0)
MCH: 30.9 pg (ref 26.0–34.0)
MCHC: 34 g/dL (ref 30.0–36.0)
MCV: 91.1 fL (ref 80.0–100.0)
Platelets: 321 10*3/uL (ref 150–400)
RBC: 4.59 MIL/uL (ref 3.87–5.11)
RDW: 12.6 % (ref 11.5–15.5)
WBC: 10.6 10*3/uL — ABNORMAL HIGH (ref 4.0–10.5)
nRBC: 0 % (ref 0.0–0.2)

## 2022-01-30 SURGERY — DENTAL RESTORATION/EXTRACTIONS
Anesthesia: General | Site: Mouth

## 2022-01-30 MED ORDER — FENTANYL CITRATE (PF) 250 MCG/5ML IJ SOLN
INTRAMUSCULAR | Status: AC
  Filled 2022-01-30: qty 5

## 2022-01-30 MED ORDER — MIDAZOLAM HCL 2 MG/2ML IJ SOLN
INTRAMUSCULAR | Status: DC | PRN
  Administered 2022-01-30: 2 mg via INTRAVENOUS

## 2022-01-30 MED ORDER — LACTATED RINGERS IV SOLN: INTRAVENOUS | Status: DC

## 2022-01-30 MED ORDER — FENTANYL CITRATE (PF) 100 MCG/2ML IJ SOLN
25.0000 ug | INTRAMUSCULAR | Status: DC | PRN
  Administered 2022-01-30: 25 ug via INTRAVENOUS

## 2022-01-30 MED ORDER — OXYCODONE HCL 5 MG PO TABS
ORAL_TABLET | ORAL | Status: AC
  Filled 2022-01-30: qty 1

## 2022-01-30 MED ORDER — PROPOFOL 10 MG/ML IV BOLUS
INTRAVENOUS | Status: AC
  Filled 2022-01-30: qty 20

## 2022-01-30 MED ORDER — CEFAZOLIN SODIUM-DEXTROSE 2-4 GM/100ML-% IV SOLN
2.0000 g | INTRAVENOUS | Status: AC
  Administered 2022-01-30: 2 g via INTRAVENOUS
  Filled 2022-01-30: qty 100

## 2022-01-30 MED ORDER — OXYCODONE HCL 5 MG PO TABS: 5.0000 mg | ORAL_TABLET | Freq: Once | ORAL | Status: DC | PRN

## 2022-01-30 MED ORDER — ONDANSETRON HCL 4 MG/2ML IJ SOLN
INTRAMUSCULAR | Status: DC | PRN
  Administered 2022-01-30: 4 mg via INTRAVENOUS

## 2022-01-30 MED ORDER — CHLORHEXIDINE GLUCONATE 0.12 % MT SOLN
15.0000 mL | Freq: Once | OROMUCOSAL | Status: AC
  Administered 2022-01-30: 15 mL via OROMUCOSAL
  Filled 2022-01-30: qty 15

## 2022-01-30 MED ORDER — FENTANYL CITRATE (PF) 250 MCG/5ML IJ SOLN
INTRAMUSCULAR | Status: DC | PRN
  Administered 2022-01-30 (×4): 50 ug via INTRAVENOUS

## 2022-01-30 MED ORDER — 0.9 % SODIUM CHLORIDE (POUR BTL) OPTIME
TOPICAL | Status: DC | PRN
  Administered 2022-01-30: 1000 mL

## 2022-01-30 MED ORDER — OXYMETAZOLINE HCL 0.05 % NA SOLN
NASAL | Status: AC
  Filled 2022-01-30: qty 30

## 2022-01-30 MED ORDER — LIDOCAINE-EPINEPHRINE 2 %-1:100000 IJ SOLN
INTRAMUSCULAR | Status: DC | PRN
  Administered 2022-01-30: 20 mL via INTRADERMAL

## 2022-01-30 MED ORDER — OXYCODONE HCL 5 MG/5ML PO SOLN: 5.0000 mg | Freq: Once | ORAL | Status: DC | PRN

## 2022-01-30 MED ORDER — ROCURONIUM 10MG/ML (10ML) SYRINGE FOR MEDFUSION PUMP - OPTIME
INTRAVENOUS | Status: DC | PRN
  Administered 2022-01-30: 60 mg via INTRAVENOUS

## 2022-01-30 MED ORDER — ORAL CARE MOUTH RINSE: 15.0000 mL | Freq: Once | OROMUCOSAL | Status: AC

## 2022-01-30 MED ORDER — GLYCOPYRROLATE 0.2 MG/ML IJ SOLN
INTRAMUSCULAR | Status: DC | PRN
  Administered 2022-01-30: .2 mg via INTRAVENOUS

## 2022-01-30 MED ORDER — LIDOCAINE-EPINEPHRINE 2 %-1:100000 IJ SOLN
INTRAMUSCULAR | Status: AC
  Filled 2022-01-30: qty 1

## 2022-01-30 MED ORDER — PROPOFOL 10 MG/ML IV BOLUS
INTRAVENOUS | Status: DC | PRN
  Administered 2022-01-30: 150 mg via INTRAVENOUS

## 2022-01-30 MED ORDER — SODIUM CHLORIDE 0.9 % IR SOLN
Status: DC | PRN
  Administered 2022-01-30: 1000 mL

## 2022-01-30 MED ORDER — ACETAMINOPHEN 10 MG/ML IV SOLN: 1000.0000 mg | Freq: Once | INTRAVENOUS | Status: DC | PRN

## 2022-01-30 MED ORDER — FENTANYL CITRATE (PF) 100 MCG/2ML IJ SOLN
INTRAMUSCULAR | Status: AC
  Filled 2022-01-30: qty 2

## 2022-01-30 MED ORDER — MIDAZOLAM HCL 2 MG/2ML IJ SOLN
INTRAMUSCULAR | Status: AC
  Filled 2022-01-30: qty 2

## 2022-01-30 MED ORDER — LIDOCAINE HCL (CARDIAC) PF 100 MG/5ML IV SOSY
PREFILLED_SYRINGE | INTRAVENOUS | Status: DC | PRN
  Administered 2022-01-30: 100 mg via INTRAVENOUS

## 2022-01-30 MED ORDER — LACTATED RINGERS IV SOLN: INTRAVENOUS | Status: DC | PRN

## 2022-01-30 MED ORDER — LIDOCAINE HCL 2 % IJ SOLN
INTRAMUSCULAR | Status: AC
  Filled 2022-01-30: qty 20

## 2022-01-30 MED ORDER — ONDANSETRON HCL 4 MG/2ML IJ SOLN: 4.0000 mg | Freq: Once | INTRAMUSCULAR | Status: DC | PRN

## 2022-01-30 MED ORDER — AMOXICILLIN 500 MG PO CAPS: 500.0000 mg | ORAL_CAPSULE | Freq: Three times a day (TID) | ORAL | 0 refills | Status: DC

## 2022-01-30 MED ORDER — SUGAMMADEX SODIUM 200 MG/2ML IV SOLN
INTRAVENOUS | Status: DC | PRN
  Administered 2022-01-30: 150 mg via INTRAVENOUS

## 2022-01-30 MED ORDER — DEXAMETHASONE SODIUM PHOSPHATE 10 MG/ML IJ SOLN
INTRAMUSCULAR | Status: DC | PRN
  Administered 2022-01-30: 10 mg via INTRAVENOUS

## 2022-01-30 MED ORDER — OXYMETAZOLINE HCL 0.05 % NA SOLN
NASAL | Status: DC | PRN
  Administered 2022-01-30 (×3): 2 via NASAL

## 2022-01-30 SURGICAL SUPPLY — 38 items
BAG COUNTER SPONGE SURGICOUNT (BAG) IMPLANT
BLADE SURG 15 STRL LF DISP TIS (BLADE) ×1 IMPLANT
BLADE SURG 15 STRL SS (BLADE) ×2
BUR CROSS CUT FISSURE 1.6 (BURR) ×2 IMPLANT
BUR EGG ELITE 4.0 (BURR) ×2 IMPLANT
CANISTER SUCT 3000ML PPV (MISCELLANEOUS) ×2 IMPLANT
COVER SURGICAL LIGHT HANDLE (MISCELLANEOUS) ×2 IMPLANT
DRAPE U-SHAPE 76X120 STRL (DRAPES) ×1 IMPLANT
GAUZE PACKING FOLDED 2  STR (GAUZE/BANDAGES/DRESSINGS) ×2
GAUZE PACKING FOLDED 2 STR (GAUZE/BANDAGES/DRESSINGS) ×1 IMPLANT
GLOVE BIO SURGEON STRL SZ 6.5 (GLOVE) IMPLANT
GLOVE BIO SURGEON STRL SZ7 (GLOVE) IMPLANT
GLOVE BIO SURGEON STRL SZ8 (GLOVE) ×2 IMPLANT
GLOVE BIOGEL PI IND STRL 6.5 (GLOVE) IMPLANT
GLOVE BIOGEL PI IND STRL 7.0 (GLOVE) IMPLANT
GLOVE BIOGEL PI INDICATOR 6.5 (GLOVE)
GLOVE BIOGEL PI INDICATOR 7.0 (GLOVE)
GOWN STRL REUS W/ TWL LRG LVL3 (GOWN DISPOSABLE) ×1 IMPLANT
GOWN STRL REUS W/ TWL XL LVL3 (GOWN DISPOSABLE) ×1 IMPLANT
GOWN STRL REUS W/TWL LRG LVL3 (GOWN DISPOSABLE) ×2
GOWN STRL REUS W/TWL XL LVL3 (GOWN DISPOSABLE) ×2
IV NS 1000ML (IV SOLUTION) ×2
IV NS 1000ML BAXH (IV SOLUTION) ×1 IMPLANT
KIT BASIN OR (CUSTOM PROCEDURE TRAY) ×2 IMPLANT
KIT TURNOVER KIT B (KITS) ×2 IMPLANT
NDL HYPO 25GX1X1/2 BEV (NEEDLE) ×2 IMPLANT
NEEDLE HYPO 25GX1X1/2 BEV (NEEDLE) ×4 IMPLANT
NS IRRIG 1000ML POUR BTL (IV SOLUTION) ×2 IMPLANT
PAD ARMBOARD 7.5X6 YLW CONV (MISCELLANEOUS) ×2 IMPLANT
SLEEVE IRRIGATION ELITE 7 (MISCELLANEOUS) ×2 IMPLANT
SPIKE FLUID TRANSFER (MISCELLANEOUS) ×1 IMPLANT
SPONGE SURGIFOAM ABS GEL 12-7 (HEMOSTASIS) IMPLANT
SUT CHROMIC 3 0 PS 2 (SUTURE) ×3 IMPLANT
SYR BULB IRRIG 60ML STRL (SYRINGE) ×2 IMPLANT
SYR CONTROL 10ML LL (SYRINGE) ×3 IMPLANT
TRAY ENT MC OR (CUSTOM PROCEDURE TRAY) ×2 IMPLANT
TUBING IRRIGATION (MISCELLANEOUS) ×2 IMPLANT
YANKAUER SUCT BULB TIP NO VENT (SUCTIONS) ×2 IMPLANT

## 2022-01-30 NOTE — Op Note (Signed)
NAMEMALILLANY, KAZLAUSKAS MEDICAL RECORD NO: [id number] ACCOUNT NO: [id number] DATE OF BIRTH: 1975/11/10 FACILITY: [facility] LOCATION: [facility] PHYSICIAN: Gae Bon, DDS  Operative Report   DATE OF PROCEDURE: 01/30/2022  PREOPERATIVE DIAGNOSIS:   Nonrestorable teeth 3, 6, 7, 8, 9, 10, 11, 12, 13, 14, 20, 21, 22, 23, 24, 25, 26, 27, 28, 29 secondary to dental caries.   Bilateral mandibular lingual tori.  POSTOPERATIVE DIAGNOSIS:  Nonrestorable teeth 3, 6, 7, 8, 9, 10, 11, 12, 13, 14, 20, 21, 22, 23, 24, 25, 26, 27, 28, 29 secondary to dental caries.  Also, Bilateral mandibular lingual tori.  PROCEDURE:  Extraction teeth #3, 6, 7, 8, 9, 10, 11, 12, 13, 14, 20, 21, 22, 23, 24, 25, 26, 27, 28, 29 alveoloplasty, right and left maxilla and mandible, removal of bilateral lingual tori.  SURGEON:  Gae Bon, DDS  ANESTHESIA:  General, nasal intubation, Dr. Kalman Shan attending.  DESCRIPTION OF PROCEDURE:  The patient was taken to the operating room and placed on the table in supine position.  General anesthesia was administered and nasal endotracheal tube was placed and secured.  The eyes were protected and the patient was  draped for surgery.  Timeout was performed.  The posterior pharynx was suctioned and a throat pack was placed.  2% lidocaine 1:100,000 epinephrine was infiltrated in an inferior alveolar block on the right and left sides and in buccal and palatal  infiltration in the maxilla around the teeth to be removed.  A bite block was placed on the right side of the mouth.  A sweetheart retractor was used to retract the tongue.  A #15 blade used to make an incision on the alveolar ridge in the edentulous  area between teeth numbers 18 and 19 and carried forward to tooth #20, then the incision was created buccally in the gingival sulcus across the midline to tooth #26 and then the incision was created lingually in the gingival sulcus until tooth #26.  The  periosteum was reflected.  The teeth were  elevated with 301 elevator and removed from the mouth with the Ash forceps.  The sockets were curetted.  The tissue was trimmed and then it was reflected to expose the alveolar crest and the lingual torus.  The  torus was reduced using the egg bur and the Stryker handpiece under irrigation and the Seldin elevator was used to protect the lingual tissues.  Then, alveoloplasty was performed on the alveolar crest because of irregular contour.  Then, the areas were  further smoothed with a bone file and then closed with a 3-0 chromic.  Then, the left maxilla was operated. The 15 blade was used to make an incision around teeth #14, 13, 12, 11, 10, 9, 8. The periosteum was reflected.  The teeth were elevated and  removed from the mouth with the dental forceps.  The sockets were curetted.  The tissue was trimmed.  Alveoplasty was performed using the egg bur followed by the bone file and then the area was closed with 3-0 chromic after irrigating.  Then, the bite  block and sweetheart retractor were repositioned to the other side of the mouth.  The 15 blade was used to make an incision around teeth numbers 27, 28, 29.  Both buccally and lingually and the incision was carried proximally to tooth #31 area to expose  the lingual torus.  The periosteum was reflected.  The teeth were elevated with 301 elevator and removed from the mouth with the dental  forceps.  Then, the alveoplasty was performed using the egg bur followed by the bone file.  The sockets were curetted,  irrigated and then the lingual torus was removed using egg bur followed by the bone file.  Then, the area was irrigated and closed with 3-0 chromic.  Then, teeth #3 and 6 were removed in the maxilla using a #15 blade, the crest of the ridge was incised  between teeth #3 and 6 to allow for flap to be reflected.  Then, the teeth were elevated and removed with the dental forceps.  The sockets were curetted and the bony contour irregularities were smoothed with  the egg bur followed by the bone file.  Then,  the area was irrigated and closed with 3-0 chromic.  Additional local anesthesia was administered.  The oral cavity was irrigated and suctioned.  The throat pack was removed.  The patient was left under care of anesthesia for extubation and transported  to recovery room with plans for discharge home through day surgery.  ESTIMATED BLOOD LOSS:  Minimal.  COMPLICATIONS:  None.  SPECIMEN:  None.   Elián.Darby D: 01/30/2022 8:24:07 am T: 01/30/2022 9:44:00 am  JOB: [id number]/ [id number]

## 2022-01-30 NOTE — Transfer of Care (Signed)
Immediate Anesthesia Transfer of Care Note  Patient: Vanessa Bowen  Procedure(s) Performed: DENTAL RESTORATION/EXTRACTIONS (Mouth)  Patient Location: PACU  Anesthesia Type:General  Level of Consciousness: awake, alert , oriented and patient cooperative  Airway & Oxygen Therapy: Patient Spontanous Breathing and Patient connected to nasal cannula oxygen  Post-op Assessment: Report given to RN, Post -op Vital signs reviewed and stable and Patient moving all extremities X 4  Post vital signs: Reviewed and stable  Last Vitals:  Vitals Value Taken Time  BP    Temp    Pulse 100 01/30/22 0827  Resp 12 01/30/22 0827  SpO2 98 % 01/30/22 0827  Vitals shown include unvalidated device data.  Last Pain:  Vitals:   01/30/22 0555  TempSrc:   PainSc: 4          Complications: No notable events documented.

## 2022-01-30 NOTE — H&P (Signed)
H&P documentation  -History and Physical Reviewed  -Patient has been re-examined  -No change in the plan of care  Vanessa Bowen  

## 2022-01-30 NOTE — Anesthesia Procedure Notes (Signed)
Procedure Name: Intubation Date/Time: 01/30/2022 7:21 AM  Performed by: Claris Che, CRNAPre-anesthesia Checklist: Patient identified, Emergency Drugs available, Suction available, Patient being monitored and Timeout performed Patient Re-evaluated:Patient Re-evaluated prior to induction Oxygen Delivery Method: Circle system utilized Preoxygenation: Pre-oxygenation with 100% oxygen Induction Type: IV induction and Cricoid Pressure applied Ventilation: Mask ventilation without difficulty Laryngoscope Size: Mac and 4 Grade View: Grade I Nasal Tubes: Right, Nasal prep performed, Nasal Rae and Magill forceps- large, utilized Tube size: 7.0 mm Number of attempts: 1 Placement Confirmation: ETT inserted through vocal cords under direct vision, positive ETCO2 and breath sounds checked- equal and bilateral Tube secured with: Tape Dental Injury: Teeth and Oropharynx as per pre-operative assessment

## 2022-01-30 NOTE — Anesthesia Preprocedure Evaluation (Signed)
Anesthesia Evaluation  Patient identified by MRN, date of birth, ID band Patient awake    Reviewed: Allergy & Precautions, NPO status , Patient's Chart, lab work & pertinent test results  Airway Mallampati: II  TM Distance: >3 FB Neck ROM: Full    Dental no notable dental hx.    Pulmonary neg pulmonary ROS, Current Smoker,    Pulmonary exam normal breath sounds clear to auscultation       Cardiovascular negative cardio ROS Normal cardiovascular exam Rhythm:Regular Rate:Normal     Neuro/Psych negative neurological ROS  negative psych ROS   GI/Hepatic Neg liver ROS, PUD,   Endo/Other  Hypothyroidism   Renal/GU negative Renal ROS  negative genitourinary   Musculoskeletal negative musculoskeletal ROS (+)   Abdominal   Peds negative pediatric ROS (+)  Hematology negative hematology ROS (+)   Anesthesia Other Findings   Reproductive/Obstetrics negative OB ROS                             Anesthesia Physical Anesthesia Plan  ASA: 2  Anesthesia Plan: General   Post-op Pain Management: Minimal or no pain anticipated   Induction: Intravenous  PONV Risk Score and Plan: 2 and Ondansetron, Dexamethasone and Treatment may vary due to age or medical condition  Airway Management Planned: Nasal ETT  Additional Equipment:   Intra-op Plan:   Post-operative Plan: Extubation in OR  Informed Consent: I have reviewed the patients History and Physical, chart, labs and discussed the procedure including the risks, benefits and alternatives for the proposed anesthesia with the patient or authorized representative who has indicated his/her understanding and acceptance.     Dental advisory given  Plan Discussed with: CRNA and Surgeon  Anesthesia Plan Comments:         Anesthesia Quick Evaluation

## 2022-01-30 NOTE — Op Note (Signed)
01/30/2022  8:18 AM  PATIENT:  Vanessa Bowen  46 y.o. female  PRE-OPERATIVE DIAGNOSIS:  non restorable teeth # 3, 6, 7, 8, 9, 10, 11, 12, 13, 14, 20, 21, 22, 23, 24, 25, 26, 27, 28, 29 secondary to dental caries, Bilateral mandibular lingual tori  POST-OPERATIVE DIAGNOSIS:  SAME  PROCEDURE:  Procedure(s): EXTRACTIONS teeth # 3, 6, 7, 8, 9, 10, 11, 12, 13, 14, 20, 21, 22, 23, 24, 25, 26, 27, 28, 29, Alveoloplasty maxilla and mandible, removal  Bilateral mandibular lingual tori  SURGEON:  Surgeon(s): Diona Browner, DMD  ANESTHESIA:   local and general  EBL:  minimal  DRAINS: none   SPECIMEN:  No Specimen  COUNTS:  YES  PLAN OF CARE: Discharge to home after PACU  PATIENT DISPOSITION:  PACU - hemodynamically stable.   PROCEDURE DETAILS: Dictation #[id number]  Gae Bon, DMD 01/30/2022 8:18 AM

## 2022-01-30 NOTE — Anesthesia Postprocedure Evaluation (Signed)
Anesthesia Post Note  Patient: Vanessa Bowen  Procedure(s) Performed: DENTAL RESTORATION/EXTRACTIONS (Mouth)     Patient location during evaluation: PACU Anesthesia Type: General Level of consciousness: awake and alert Pain management: pain level controlled Vital Signs Assessment: post-procedure vital signs reviewed and stable Respiratory status: spontaneous breathing, nonlabored ventilation, respiratory function stable and patient connected to nasal cannula oxygen Cardiovascular status: blood pressure returned to baseline and stable Postop Assessment: no apparent nausea or vomiting Anesthetic complications: no   No notable events documented.  Last Vitals:  Vitals:   01/30/22 0542 01/30/22 0830  BP: 103/62   Pulse: 75   Resp: 17   Temp: 36.4 C (!) 36.1 C  SpO2: 97%     Last Pain:  Vitals:   01/30/22 0845  TempSrc:   PainSc: 7                  Yasin Ducat S

## 2022-01-31 ENCOUNTER — Encounter (HOSPITAL_COMMUNITY): Payer: Self-pay | Admitting: Oral Surgery

## 2022-02-24 ENCOUNTER — Encounter: Payer: Self-pay | Admitting: Orthopaedic Surgery

## 2022-02-24 ENCOUNTER — Ambulatory Visit: Payer: Medicaid Other | Admitting: Orthopaedic Surgery

## 2022-02-24 VITALS — BP 112/79 | HR 97 | Ht 63.0 in | Wt 149.0 lb

## 2022-02-24 DIAGNOSIS — G894 Chronic pain syndrome: Secondary | ICD-10-CM | POA: Diagnosis not present

## 2022-02-24 DIAGNOSIS — M545 Low back pain, unspecified: Secondary | ICD-10-CM

## 2022-02-24 DIAGNOSIS — F17210 Nicotine dependence, cigarettes, uncomplicated: Secondary | ICD-10-CM | POA: Diagnosis not present

## 2022-02-24 DIAGNOSIS — G8929 Other chronic pain: Secondary | ICD-10-CM

## 2022-02-24 MED ORDER — HYDROCODONE-ACETAMINOPHEN 7.5-325 MG PO TABS: ORAL_TABLET | ORAL | 0 refills | Status: DC

## 2022-02-24 NOTE — Progress Notes (Signed)
My back still hurts.  She has chronic pain of the lumbar spine and is on narcotics for this.  She has been at a pain clinic and I have reviewed the notes.  She would like me to resume giving her pain medicine.  I have read the multiple notes.  I have talked to her about her pain medicine.  She understands that I may retire in the next year or two and she will need to find another doctor.  She also was told of me seeing her at least every three months and monitor her medicine and my protocol.  She has chronic pain with localized to the lumbar spine. She has no new trauma, no weakness.  She has no bowel or bladder problems.  Spine/Pelvis examination:  Inspection:  Overall, sacoiliac joint benign and hips nontender; without crepitus or defects.   Thoracic spine inspection: Alignment normal without kyphosis present   Lumbar spine inspection:  Alignment  with normal lumbar lordosis, without scoliosis apparent.   Thoracic spine palpation:  without tenderness of spinal processes   Lumbar spine palpation: without tenderness of lumbar area; without tightness of lumbar muscles    Range of Motion:   Lumbar flexion, forward flexion is normal without pain or tenderness    Lumbar extension is full without pain or tenderness   Left lateral bend is normal without pain or tenderness   Right lateral bend is normal without pain or tenderness   Straight leg raising is normal  Strength & tone: normal   Stability overall normal stability  Encounter Diagnoses  Name Primary?   Chronic midline low back pain without sciatica Yes   Cigarette nicotine dependence without complication    Chronic pain syndrome    I have reviewed the [place] web site prior to prescribing narcotic medicine for this patient.  Return in three months. Call if any problem.  Precautions discussed.  Electronically Signed Sanjuana Kava, MD 9/5/202310:36 AM

## 2022-03-20 ENCOUNTER — Ambulatory Visit: Payer: Medicaid Other | Admitting: Obstetrics & Gynecology

## 2022-03-23 ENCOUNTER — Telehealth: Payer: Self-pay | Admitting: Orthopaedic Surgery

## 2022-03-24 MED ORDER — HYDROCODONE-ACETAMINOPHEN 7.5-325 MG PO TABS: ORAL_TABLET | ORAL | 0 refills | Status: DC

## 2022-04-15 LAB — TSH: TSH: 0.029 u[IU]/mL — ABNORMAL LOW (ref 0.450–4.500)

## 2022-04-15 LAB — T4, FREE: Free T4: 1.7 ng/dL (ref 0.82–1.77)

## 2022-04-20 NOTE — Patient Instructions (Signed)

## 2022-04-21 ENCOUNTER — Encounter: Payer: Self-pay | Admitting: Nurse Practitioner

## 2022-04-21 ENCOUNTER — Telehealth: Payer: Self-pay | Admitting: Orthopaedic Surgery

## 2022-04-21 ENCOUNTER — Ambulatory Visit (INDEPENDENT_AMBULATORY_CARE_PROVIDER_SITE_OTHER): Payer: Medicaid Other | Admitting: Nurse Practitioner

## 2022-04-21 VITALS — BP 107/72 | HR 84 | Ht 63.0 in | Wt 135.8 lb

## 2022-04-21 DIAGNOSIS — E038 Other specified hypothyroidism: Secondary | ICD-10-CM | POA: Diagnosis not present

## 2022-04-21 MED ORDER — HYDROCODONE-ACETAMINOPHEN 7.5-325 MG PO TABS: ORAL_TABLET | ORAL | 0 refills | Status: DC

## 2022-04-21 MED ORDER — LEVOTHYROXINE SODIUM 100 MCG PO TABS: 100.0000 ug | ORAL_TABLET | Freq: Every day | ORAL | 1 refills | Status: DC

## 2022-04-21 NOTE — Progress Notes (Signed)
04/21/2022        Endocrinology follow-up note    Subjective:    Patient ID: Vanessa Bowen, female    DOB: Mar 21, 1976, PCP Redmond School, MD   Past Medical History:  Diagnosis Date   Anxiety    Arthritis    Chronic back pain    Colitis, ulcerative ([place])    Depression    History of kidney stones    2011   Hypothyroidism    Migraine    no longer has migraines   Pneumonia    Past Surgical History:  Procedure Laterality Date   ANKLE SURGERY     BREAST ENHANCEMENT SURGERY     CESAREAN SECTION     KNEE SURGERY Right    arthroscopy   LAPAROSCOPIC BILATERAL SALPINGECTOMY  07/27/2012   Procedure: LAPAROSCOPIC BILATERAL SALPINGECTOMY;  Surgeon: Florian Buff, MD;  Location: [facility];  Service: Gynecology;  Laterality: N/A;   OOPHORECTOMY Right 09/09/2016   Procedure: RIGHT OOPHORECTOMY;  Surgeon: Florian Buff, MD;  Location: [facility];  Service: Gynecology;  Laterality: Right;   OVARIAN CYST REMOVAL     TOOTH EXTRACTION N/A 01/30/2022   Procedure: DENTAL RESTORATION/EXTRACTIONS;  Surgeon: Diona Browner, DMD;  Location: [facility];  Service: Oral Surgery;  Laterality: N/A;   VAGINAL HYSTERECTOMY N/A 09/09/2016   Procedure: HYSTERECTOMY VAGINAL;  Surgeon: Florian Buff, MD;  Location: [facility];  Service: Gynecology;  Laterality: N/A;   Social History   Socioeconomic History   Marital status: Legally Separated    Spouse name: Not on file   Number of children: 2   Years of education: Not on file   Highest education level: Not on file  Occupational History   Not on file  Tobacco Use   Smoking status: Every Day    Packs/day: 0.50    Years: 15.00    Total pack years: 7.50    Types: Cigarettes   Smokeless tobacco: Never  Vaping Use   Vaping Use: Never used  Substance and Sexual Activity   Alcohol use: Yes    Comment: rare   Drug use: No   Sexual activity: Not Currently    Birth control/protection: Surgical    Comment: hyst  Other Topics Concern   Not on file  Social  History Narrative   Not on file   Social Determinants of Health   Financial Resource Strain: Not on file  Food Insecurity: Not on file  Transportation Needs: Not on file  Physical Activity: Not on file  Stress: Not on file  Social Connections: Not on file   Outpatient Encounter Medications as of 04/21/2022  Medication Sig   ALPRAZolam (XANAX) 1 MG tablet Take 102m po bid prn for anxiety for 1 month, then take 154mdaily as needed for anxiety for 1 month and then discontinue completely. (Patient taking differently: Take 1 mg by mouth 4 (four) times daily as needed for anxiety.)   clindamycin (CLEOCIN-T) 1 % external solution Apply topically 2 (two) times daily. (Patient taking differently: Apply 1 Application topically 2 (two) times daily as needed (Hidradentis Suppurativa).)   HYDROcodone-acetaminophen (NORCO) 7.5-325 MG tablet Take one tablet by mouth every six hours as needed for pain.  Must last 28 days.   ibuprofen (ADVIL,MOTRIN) 200 MG tablet Take 400-600 mg by mouth every 8 (eight) hours as needed (for pain/headaches.).   tiZANidine (ZANAFLEX) 4 MG tablet Take 4 mg by mouth every 6 (six) hours as needed for muscle spasms.  topiramate (TOPAMAX) 100 MG tablet Take 100 mg by mouth at bedtime.   traZODone (DESYREL) 150 MG tablet Take 300 mg by mouth at bedtime.   [DISCONTINUED] levothyroxine (SYNTHROID) 100 MCG tablet Take 1 tablet (100 mcg total) by mouth every other day.   [DISCONTINUED] levothyroxine (SYNTHROID) 112 MCG tablet Take 1 tablet (112 mcg total) by mouth every other day.   conjugated estrogens (PREMARIN) vaginal cream USE 1 GRAM twice per week (Patient not taking: Reported on 04/21/2022)   levothyroxine (SYNTHROID) 100 MCG tablet Take 1 tablet (100 mcg total) by mouth daily before breakfast.   MOVANTIK 25 MG TABS tablet Take 25 mg by mouth daily. (Patient not taking: Reported on 04/21/2022)   [DISCONTINUED] cetirizine (ZYRTEC ALLERGY) 10 MG tablet Take 1 tablet (10 mg total)  by mouth daily.   [DISCONTINUED] clindamycin (CLINDAGEL) 1 % gel Apply topically 2 (two) times daily as needed. Apply to affected area   [DISCONTINUED] dicyclomine (BENTYL) 20 MG tablet Take 1 tablet (20 mg total) by mouth 3 (three) times daily as needed for up to 30 days for spasms.   [DISCONTINUED] Estradiol (DIVIGEL) 1 MG/GM GEL Place 1 mg onto the skin daily.   [DISCONTINUED] fluticasone (FLONASE) 50 MCG/ACT nasal spray Place 1 spray into both nostrils 2 (two) times daily.   [DISCONTINUED] HYDROcodone-acetaminophen (NORCO) 7.5-325 MG tablet Take 1 tablet by mouth every 6 (six) hours as needed for severe pain.   [DISCONTINUED] levothyroxine (SYNTHROID) 100 MCG tablet Take 1 tablet (100 mcg total) by mouth daily before breakfast.   [DISCONTINUED] traZODone (DESYREL) 150 MG tablet Take 2 tablets (300 mg total) by mouth at bedtime.   No facility-administered encounter medications on file as of 04/21/2022.   ALLERGIES: Allergies  Allergen Reactions   Doxycycline Nausea And Vomiting   Sulfonamide Derivatives Nausea And Vomiting   VACCINATION STATUS:  There is no immunization history on file for this patient.  Thyroid Problem Presents for follow-up visit. Symptoms include anxiety, depressed mood, fatigue, palpitations and weight loss (intentional). Patient reports no cold intolerance, constipation, diarrhea, heat intolerance or tremors. 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    46 yr old female with medical history.  She is returning with repeat thyroid function test for follow-up of longstanding hypothyroidism.    She has hypothyroidism diagnosed at approximate age of 64 years.  She is currently on Levothyroxine 112 mcg and 100 mcg on alternating days.  She reports better consistency taking her medication at this time.  She has no new complaints today.   She denies palpitations, tremors, nor heat/cold  intolerance.    Review of systems  Constitutional: + decreasing body weight-having hard time eating due to recent dental changes  current Body mass index is 24.06 kg/m. , + fatigue, no subjective hyperthermia, no subjective hypothermia Eyes: no blurry vision, no xerophthalmia ENT: no sore throat, no nodules palpated in throat, no dysphagia/odynophagia, no hoarseness Cardiovascular: no chest pain, no shortness of breath, + palpitations, no leg swelling Respiratory: no cough, no shortness of breath Gastrointestinal: no nausea/vomiting/diarrhea Musculoskeletal: no muscle/joint aches Skin: no rashes, no hyperemia Neurological: no tremors, no numbness, no tingling, no dizziness Psychiatric: + anxiety- controlled on meds    Objective:    BP 107/72 (BP Location: Right Arm, Patient Position: Sitting, Cuff Size: Normal)   Pulse 84   Ht 5' 3"  (1.6 m)   Wt 135 lb 12.8 oz (61.6 kg)   LMP  07/24/2016   BMI 24.06 kg/m   Wt Readings from Last 3 Encounters:  04/21/22 135 lb 12.8 oz (61.6 kg)  02/24/22 149 lb (67.6 kg)  01/30/22 149 lb 9.6 oz (67.9 kg)    BP Readings from Last 3 Encounters:  04/21/22 107/72  02/24/22 112/79  01/30/22 (!) 107/57     Physical Exam- Limited  Constitutional:  Body mass index is 24.06 kg/m. , not in acute distress, normal state of mind Eyes:  EOMI, no exophthalmos Neck: Supple Cardiovascular: RRR, no murmurs, rubs, or gallops, no edema Respiratory: Adequate breathing efforts, no crackles, rales, rhonchi, or wheezing Musculoskeletal: no gross deformities, strength intact in all four extremities, no gross restriction of joint movements Skin:  no rashes, no hyperemia Neurological: no tremor with outstretched hands    CMP     Component Value Date/Time   NA 136 10/09/2019 1442   K 4.0 10/09/2019 1442   CL 101 10/09/2019 1442   CO2 25 10/09/2019 1442   GLUCOSE 123 (H) 10/09/2019 1442   BUN 15 10/09/2019 1442   CREATININE 0.81 10/09/2019 1442    CREATININE 1.05 04/27/2017 1226   CALCIUM 9.6 10/09/2019 1442   PROT 7.6 10/09/2019 1442   ALBUMIN 4.5 10/09/2019 1442   AST 14 (L) 10/09/2019 1442   ALT 14 10/09/2019 1442   ALKPHOS 57 10/09/2019 1442   BILITOT 0.6 10/09/2019 1442   GFRNONAA >60 10/09/2019 1442   GFRAA >60 10/09/2019 1442   Recent Results (from the past 2160 hour(s))  CBC per protocol     Status: Abnormal   Collection Time: 01/30/22  5:38 AM  Result Value Ref Range   WBC 10.6 (H) 4.0 - 10.5 K/uL   RBC 4.59 3.87 - 5.11 MIL/uL   Hemoglobin 14.2 12.0 - 15.0 g/dL   HCT 41.8 36.0 - 46.0 %   MCV 91.1 80.0 - 100.0 fL   MCH 30.9 26.0 - 34.0 pg   MCHC 34.0 30.0 - 36.0 g/dL   RDW 12.6 11.5 - 15.5 %   Platelets 321 150 - 400 K/uL   nRBC 0.0 0.0 - 0.2 %    Comment: Performed at [facility], [place] [address], [place], [place] [address]  TSH     Status: Abnormal   Collection Time: 04/14/22 11:01 AM  Result Value Ref Range   TSH 0.029 (L) 0.450 - 4.500 uIU/mL  T4, free     Status: None   Collection Time: 04/14/22 11:01 AM  Result Value Ref Range   Free T4 1.70 0.82 - 1.77 ng/dL    Latest Reference Range & Units 06/26/20 11:07 01/01/21 11:39 07/21/21 12:06 01/12/22 10:55 04/14/22 11:01  TSH 0.450 - 4.500 uIU/mL 0.065 (L) 2.940 0.368 (L) 0.163 (L) 0.029 (L)  T4,Free(Direct) 0.82 - 1.77 ng/dL 1.89 (H) 1.36 1.50 1.91 (H) 1.70  (L): Data is abnormally low (H): Data is abnormally high Assessment & Plan:   1. Hypothyroidism  -Her previsit thyroid function tests are consistent with slight over-replacement (TSH slightly suppressed but Free T4 normal).  She does continue to have symptoms of over-replacement.  Will decrease her Levothyroxine to 100 mcg po daily before breakfast (no longer alternating doses).    - We discussed about the correct intake of her thyroid hormone, on empty stomach at fasting, with water, separated by at least 30 minutes from breakfast and other medications,  and separated by more than 4 hours from  calcium, iron, multivitamins, acid reflux medications (PPIs). -Patient is made aware of  the fact that thyroid hormone replacement is needed for life, dose to be adjusted by periodic monitoring of thyroid function tests.  - I advised patient to maintain close follow up with Redmond School, MD for primary care needs.     I spent 30 minutes in the care of the patient today including review of labs from Thyroid Function, CMP, and other relevant labs ; imaging/biopsy records (current and previous including abstractions from other facilities); face-to-face time discussing  her lab results and symptoms, medications doses, her options of short and long term treatment based on the latest standards of care / guidelines;   and documenting the encounter.  Vanessa Bowen  participated in the discussions, expressed understanding, and voiced agreement with the above plans.  All questions were answered to her satisfaction. she is encouraged to contact clinic should she have any questions or concerns prior to her return visit.   Follow up plan: Return in about 8 weeks (around 06/16/2022) for Thyroid follow up, Previsit labs.  Rayetta Pigg, [facility] [address] [place], [place] [address] Phone: [phone] Fax: [phone]  04/21/2022, 10:22 AM

## 2022-05-19 ENCOUNTER — Telehealth: Payer: Self-pay | Admitting: Orthopaedic Surgery

## 2022-05-19 MED ORDER — HYDROCODONE-ACETAMINOPHEN 7.5-325 MG PO TABS: ORAL_TABLET | ORAL | 0 refills | Status: DC

## 2022-05-26 ENCOUNTER — Ambulatory Visit: Payer: Medicaid Other | Admitting: Orthopedic Surgery

## 2022-05-26 ENCOUNTER — Ambulatory Visit: Payer: Medicaid Other | Admitting: Orthopaedic Surgery

## 2022-05-26 ENCOUNTER — Encounter: Payer: Self-pay | Admitting: Orthopaedic Surgery

## 2022-05-26 VITALS — BP 106/66 | HR 71

## 2022-05-26 DIAGNOSIS — G894 Chronic pain syndrome: Secondary | ICD-10-CM | POA: Diagnosis not present

## 2022-05-26 DIAGNOSIS — G8929 Other chronic pain: Secondary | ICD-10-CM

## 2022-05-26 DIAGNOSIS — F17210 Nicotine dependence, cigarettes, uncomplicated: Secondary | ICD-10-CM

## 2022-05-26 DIAGNOSIS — M545 Low back pain, unspecified: Secondary | ICD-10-CM

## 2022-05-26 NOTE — Patient Instructions (Signed)
Call for your refill on DECEMBER 21st BEFORE 10:00am. Dr.Keeling will be out of the office the week of Christmas

## 2022-05-26 NOTE — Progress Notes (Signed)
I have those days.  She has chronic pain in the lower back with good and bad days.  She has no new trauma, no weakness.  Spine/Pelvis examination:  Inspection:  Overall, sacoiliac joint benign and hips nontender; without crepitus or defects.   Thoracic spine inspection: Alignment normal without kyphosis present   Lumbar spine inspection:  Alignment  with normal lumbar lordosis, without scoliosis apparent.   Thoracic spine palpation:  without tenderness of spinal processes   Lumbar spine palpation: without tenderness of lumbar area; without tightness of lumbar muscles    Range of Motion:   Lumbar flexion, forward flexion is normal without pain or tenderness    Lumbar extension is full without pain or tenderness   Left lateral bend is normal without pain or tenderness   Right lateral bend is normal without pain or tenderness   Straight leg raising is normal  Strength & tone: normal   Stability overall normal stability Encounter Diagnoses  Name Primary?   Chronic midline low back pain without sciatica Yes   Cigarette nicotine dependence without complication    Chronic pain syndrome    I will see her in three months.  Call for refills on pain medicine.  Continue exercises.  Call if any problem.  Precautions discussed.  Electronically Signed Sanjuana Kava, MD 12/5/20232:16 PM

## 2022-06-09 ENCOUNTER — Telehealth: Payer: Self-pay | Admitting: Orthopaedic Surgery

## 2022-06-10 MED ORDER — HYDROCODONE-ACETAMINOPHEN 7.5-325 MG PO TABS: ORAL_TABLET | ORAL | 0 refills | Status: DC

## 2022-06-12 LAB — TSH: TSH: 0.02 u[IU]/mL — ABNORMAL LOW (ref 0.450–4.500)

## 2022-06-12 LAB — T4, FREE: Free T4: 1.64 ng/dL (ref 0.82–1.77)

## 2022-06-16 ENCOUNTER — Ambulatory Visit: Payer: Medicaid Other | Admitting: Nurse Practitioner

## 2022-06-17 ENCOUNTER — Ambulatory Visit: Payer: Medicaid Other | Admitting: Nurse Practitioner

## 2022-06-17 NOTE — Patient Instructions (Incomplete)

## 2022-07-06 NOTE — Patient Instructions (Incomplete)

## 2022-07-07 ENCOUNTER — Ambulatory Visit: Payer: Medicaid Other | Admitting: Nurse Practitioner

## 2022-07-07 DIAGNOSIS — E038 Other specified hypothyroidism: Secondary | ICD-10-CM

## 2022-07-13 ENCOUNTER — Telehealth: Payer: Self-pay | Admitting: Orthopaedic Surgery

## 2022-07-14 ENCOUNTER — Other Ambulatory Visit (HOSPITAL_COMMUNITY): Payer: Self-pay | Admitting: Internal Medicine

## 2022-07-14 DIAGNOSIS — R1030 Lower abdominal pain, unspecified: Secondary | ICD-10-CM

## 2022-07-14 MED ORDER — HYDROCODONE-ACETAMINOPHEN 7.5-325 MG PO TABS: ORAL_TABLET | ORAL | 0 refills | Status: DC

## 2022-07-15 ENCOUNTER — Encounter (HOSPITAL_COMMUNITY): Payer: Self-pay

## 2022-07-15 ENCOUNTER — Ambulatory Visit (HOSPITAL_COMMUNITY): Payer: Medicaid Other

## 2022-07-27 NOTE — Patient Instructions (Signed)

## 2022-07-28 ENCOUNTER — Ambulatory Visit: Payer: Medicaid Other | Admitting: Nurse Practitioner

## 2022-07-28 ENCOUNTER — Encounter: Payer: Self-pay | Admitting: Nurse Practitioner

## 2022-07-28 VITALS — BP 108/70 | HR 85 | Ht 63.0 in | Wt 124.2 lb

## 2022-07-28 DIAGNOSIS — E038 Other specified hypothyroidism: Secondary | ICD-10-CM

## 2022-07-28 MED ORDER — LEVOTHYROXINE SODIUM 88 MCG PO TABS: 88.0000 ug | ORAL_TABLET | Freq: Every day | ORAL | 1 refills | Status: DC

## 2022-07-28 NOTE — Progress Notes (Signed)
07/28/2022        Endocrinology follow-up note    Subjective:    Patient ID: Vanessa Bowen, female    DOB: 1975/12/14, PCP Redmond School, MD   Past Medical History:  Diagnosis Date   Anxiety    Arthritis    Chronic back pain    Colitis, ulcerative ([place])    Depression    History of kidney stones    2011   Hypothyroidism    Migraine    no longer has migraines   Pneumonia    Past Surgical History:  Procedure Laterality Date   ANKLE SURGERY     BREAST ENHANCEMENT SURGERY     CESAREAN SECTION     KNEE SURGERY Right    arthroscopy   LAPAROSCOPIC BILATERAL SALPINGECTOMY  07/27/2012   Procedure: LAPAROSCOPIC BILATERAL SALPINGECTOMY;  Surgeon: Florian Buff, MD;  Location: [facility];  Service: Gynecology;  Laterality: N/A;   OOPHORECTOMY Right 09/09/2016   Procedure: RIGHT OOPHORECTOMY;  Surgeon: Florian Buff, MD;  Location: [facility];  Service: Gynecology;  Laterality: Right;   OVARIAN CYST REMOVAL     TOOTH EXTRACTION N/A 01/30/2022   Procedure: DENTAL RESTORATION/EXTRACTIONS;  Surgeon: Diona Browner, DMD;  Location: [facility];  Service: Oral Surgery;  Laterality: N/A;   VAGINAL HYSTERECTOMY N/A 09/09/2016   Procedure: HYSTERECTOMY VAGINAL;  Surgeon: Florian Buff, MD;  Location: [facility];  Service: Gynecology;  Laterality: N/A;   Social History   Socioeconomic History   Marital status: Legally Separated    Spouse name: Not on file   Number of children: 2   Years of education: Not on file   Highest education level: Not on file  Occupational History   Not on file  Tobacco Use   Smoking status: Every Day    Packs/day: 0.50    Years: 15.00    Total pack years: 7.50    Types: Cigarettes   Smokeless tobacco: Never  Vaping Use   Vaping Use: Never used  Substance and Sexual Activity   Alcohol use: Yes    Comment: rare   Drug use: No   Sexual activity: Not Currently    Birth control/protection: Surgical    Comment: hyst  Other Topics Concern   Not on file  Social  History Narrative   Not on file   Social Determinants of Health   Financial Resource Strain: Not on file  Food Insecurity: Not on file  Transportation Needs: Not on file  Physical Activity: Not on file  Stress: Not on file  Social Connections: Not on file   Outpatient Encounter Medications as of 07/28/2022  Medication Sig   ALPRAZolam (XANAX) 1 MG tablet Take '1mg'$  po bid prn for anxiety for 1 month, then take '1mg'$  daily as needed for anxiety for 1 month and then discontinue completely. (Patient taking differently: Take 1 mg by mouth 4 (four) times daily as needed for anxiety.)   clindamycin (CLEOCIN-T) 1 % external solution Apply topically 2 (two) times daily. (Patient taking differently: Apply 1 Application topically 2 (two) times daily as needed (Hidradentis Suppurativa).)   HYDROcodone-acetaminophen (NORCO) 7.5-325 MG tablet Take one tablet by mouth every six hours as needed for pain.  Must last 28 days.   ibuprofen (ADVIL,MOTRIN) 200 MG tablet Take 400-600 mg by mouth every 8 (eight) hours as needed (for pain/headaches.).   MOVANTIK 25 MG TABS tablet Take 25 mg by mouth daily.   tiZANidine (ZANAFLEX) 4 MG tablet Take  4 mg by mouth every 6 (six) hours as needed for muscle spasms.    topiramate (TOPAMAX) 100 MG tablet Take 100 mg by mouth at bedtime.   traZODone (DESYREL) 150 MG tablet Take 300 mg by mouth at bedtime.   [DISCONTINUED] levothyroxine (SYNTHROID) 100 MCG tablet Take 1 tablet (100 mcg total) by mouth daily before breakfast.   levothyroxine (SYNTHROID) 88 MCG tablet Take 1 tablet (88 mcg total) by mouth daily before breakfast.   [DISCONTINUED] conjugated estrogens (PREMARIN) vaginal cream USE 1 GRAM twice per week (Patient not taking: Reported on 07/28/2022)   [DISCONTINUED] dicyclomine (BENTYL) 20 MG tablet Take 1 tablet (20 mg total) by mouth 3 (three) times daily as needed for up to 30 days for spasms.   No facility-administered encounter medications on file as of 07/28/2022.    ALLERGIES: Allergies  Allergen Reactions   Doxycycline Nausea And Vomiting   Sulfonamide Derivatives Nausea And Vomiting   VACCINATION STATUS:  There is no immunization history on file for this patient.  Thyroid Problem Presents for follow-up visit. Symptoms include anxiety, depressed mood, fatigue, palpitations and weight loss (intentional). Patient reports no cold intolerance, constipation, diarrhea, heat intolerance or tremors. 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    47 yr old female with medical history.  She is returning with repeat thyroid function test for follow-up of longstanding hypothyroidism.    She has hypothyroidism diagnosed at approximate age of 16 years.  She is currently on Levothyroxine 100 mcg daily.     Review of systems  Constitutional: + steadily decreasing body weight-cannot chew much due to ill fitting dentures,  current Body mass index is 22 kg/m. , no fatigue, no subjective hyperthermia, no subjective hypothermia Eyes: no blurry vision, no xerophthalmia ENT: no sore throat, no nodules palpated in throat, no dysphagia/odynophagia, no hoarseness Cardiovascular: no chest pain, no shortness of breath, + palpitations-improving somewhat, no leg swelling Respiratory: no cough, no shortness of breath Gastrointestinal: no nausea/vomiting/diarrhea Musculoskeletal: no muscle/joint aches Skin: no rashes, no hyperemia Neurological: no tremors, no numbness, no tingling, no dizziness Psychiatric: no depression, no anxiety    Objective:    BP 108/70 (BP Location: Left Arm, Patient Position: Sitting, Cuff Size: Normal)   Pulse 85   Ht '5\' 3"'$  (1.6 m)   Wt 124 lb 3.2 oz (56.3 kg)   LMP 07/24/2016   BMI 22.00 kg/m   Wt Readings from Last 3 Encounters:  07/28/22 124 lb 3.2 oz (56.3 kg)  04/21/22 135 lb 12.8 oz (61.6 kg)  02/24/22 149 lb (67.6 kg)    BP Readings from Last  3 Encounters:  07/28/22 108/70  05/26/22 106/66  04/21/22 107/72     Physical Exam- Limited  Constitutional:  Body mass index is 22 kg/m. , not in acute distress, normal state of mind Eyes:  EOMI, no exophthalmos Musculoskeletal: no gross deformities, strength intact in all four extremities, no gross restriction of joint movements Skin:  no rashes, no hyperemia Neurological: no tremor with outstretched hands    CMP     Component Value Date/Time   NA 136 10/09/2019 1442   K 4.0 10/09/2019 1442   CL 101 10/09/2019 1442   CO2 25 10/09/2019 1442   GLUCOSE 123 (H) 10/09/2019 1442   BUN 15 10/09/2019 1442   CREATININE 0.81 10/09/2019 1442   CREATININE 1.05 04/27/2017 1226   CALCIUM 9.6 10/09/2019 1442   PROT 7.6 10/09/2019  1442   ALBUMIN 4.5 10/09/2019 1442   AST 14 (L) 10/09/2019 1442   ALT 14 10/09/2019 1442   ALKPHOS 57 10/09/2019 1442   BILITOT 0.6 10/09/2019 1442   GFRNONAA >60 10/09/2019 1442   GFRAA >60 10/09/2019 1442   Recent Results (from the past 2160 hour(s))  TSH     Status: Abnormal   Collection Time: 06/11/22  1:17 PM  Result Value Ref Range   TSH 0.020 (L) 0.450 - 4.500 uIU/mL  T4, free     Status: None   Collection Time: 06/11/22  1:17 PM  Result Value Ref Range   Free T4 1.64 0.82 - 1.77 ng/dL    Latest Reference Range & Units 07/21/21 12:06 01/12/22 10:55 04/14/22 11:01 06/11/22 13:17  TSH 0.450 - 4.500 uIU/mL 0.368 (L) 0.163 (L) 0.029 (L) 0.020 (L)  T4,Free(Direct) 0.82 - 1.77 ng/dL 1.50 1.91 (H) 1.70 1.64  (L): Data is abnormally low (H): Data is abnormally high Assessment & Plan:   1. Hypothyroidism  -Her previsit thyroid function tests are consistent with slight over-replacement (TSH slightly suppressed but Free T4 high normal).  She reports improvement in her symptoms, not yet resolved (likely due to continued weight loss from recent dental procedures and ill fitting dentures).  She is advised to lower her Levothyroxine to 88 mcg po daily  before breakfast.    - We discussed about the correct intake of her thyroid hormone, on empty stomach at fasting, with water, separated by at least 30 minutes from breakfast and other medications,  and separated by more than 4 hours from calcium, iron, multivitamins, acid reflux medications (PPIs). -Patient is made aware of the fact that thyroid hormone replacement is needed for life, dose to be adjusted by periodic monitoring of thyroid function tests.  - I advised patient to maintain close follow up with Redmond School, MD for primary care needs.     I spent  24  minutes in the care of the patient today including review of labs from Thyroid Function, CMP, and other relevant labs ; imaging/biopsy records (current and previous including abstractions from other facilities); face-to-face time discussing  her lab results and symptoms, medications doses, her options of short and long term treatment based on the latest standards of care / guidelines;   and documenting the encounter.  Vanessa Bowen  participated in the discussions, expressed understanding, and voiced agreement with the above plans.  All questions were answered to her satisfaction. she is encouraged to contact clinic should she have any questions or concerns prior to her return visit.   Follow up plan: Return in about 2 months (around 09/26/2022) for Previsit labs, Thyroid follow up.  Rayetta Pigg, [facility] [address] [place], [place] [address] Phone: [phone] Fax: [phone]  07/28/2022, 2:04 PM

## 2022-08-03 ENCOUNTER — Telehealth: Payer: Self-pay | Admitting: *Deleted

## 2022-08-03 NOTE — Telephone Encounter (Signed)
Vanessa Bowen called the office and states that she was looking at her after visit summary. She saw that it stated that she was intentionally losing weight. She shares that she is not intentionally losing weight. When she is under stress she cannot eat , her Dad was diagnosed with cancer in 2021 and she l ost about 20 pounds , with her recent denture work she has lost about 10 pounds. She is eating but there are a few things that she is not able to eat due to the denture work.  She states at her office visit, 07/28/2022 her medication was changed to 88 mcg daily. She is asking if the low TSH level is the reason for her weight loss.when the TSH started getting low , she states that this is when she started noticing the weight loss. Vanessa Bowen also shares that she has never been explained to as to the why in all the changes in her thyroid medication doses.  She is also requesting that the part in the after visit summary about her weight lose being intentional be removed. She has gone to other doctors and they are wanting to do other test for other things. She would like a call back about this.

## 2022-08-03 NOTE — Telephone Encounter (Signed)
We did discuss this in length at her appointment.  If she experiences drastic weight changes, the dosage of her thyroid replacement pill will most likely need to change too since it is a weight based medication primarily.  We also discussed that if her Levothyroxine dosage is too high it can contribute to some weight loss, anxiety, irritability, insomnia, diarrhea, palpitations, etc.  But again I think her dental work is the primary reason for her drastic weight loss.  She told me the dentures she had were ill-fitting and that she would have to continue to use them because she needed to wait a period of time to make sure swelling was all gone before getting new dentures made.  Also, I went back to the note from her last visit 07/28/22 and I wrote that she lost weight as a result of recent dental work.  Perhaps she was looking at a different encounter date?

## 2022-08-04 NOTE — Telephone Encounter (Signed)
Ok, I agree that she proceed with her PCP in follow up for other underlying issues that may be contributing to her weight loss.

## 2022-08-04 NOTE — Telephone Encounter (Signed)
Patient was called and given newest recommendation by [name]. Patient did not feel that her question was being answered , patient call transferred to [facility].

## 2022-08-04 NOTE — Telephone Encounter (Signed)
Talked with Vanessa Bowen to share with her [name]'s response. Patient shares that she does not recall all that I shared being discussed at her recent appointment. She shares that she has been hypothyroid for 20 plus years. She lost 5-6 pounds Her normal weight was between 170 -200 lbs until her Dad, in 2021, was diagnosed  with cancer. At this time she lost 20 lbs. When her T4 got straight and the TSH lowered she noticed that she started losing 2.5 lbs every week and half. At PCP pn 07/14/2022 she weighed 121.8 lbs , At her office visit her she weighed 124 lbs our scales are two pounds over what her scales are, and this past Saturday she weighed 119 lbs. She states that she is eating a high calorie diet and she also states that she has lost weight with her dental work and ill fitting dentures.  She remembers the conversation about her medication. She says that she was told that she be on the 99 mcg but this is not made anymore.She was put on the 88 mcg and was told that if the TSH dropped more that she would need to go on two different doses , alternating days.   Patient voice concern about her TSH and loss of weight, and states again that this weight loss is not all because of her dental work and her ill fitting dentures.

## 2022-08-10 ENCOUNTER — Other Ambulatory Visit (HOSPITAL_COMMUNITY): Payer: Self-pay | Admitting: Internal Medicine

## 2022-08-10 ENCOUNTER — Telehealth: Payer: Self-pay | Admitting: Orthopaedic Surgery

## 2022-08-10 DIAGNOSIS — R634 Abnormal weight loss: Secondary | ICD-10-CM

## 2022-08-10 DIAGNOSIS — R1030 Lower abdominal pain, unspecified: Secondary | ICD-10-CM

## 2022-08-10 DIAGNOSIS — I700 Atherosclerosis of aorta: Secondary | ICD-10-CM

## 2022-08-10 DIAGNOSIS — R6881 Early satiety: Secondary | ICD-10-CM

## 2022-08-10 MED ORDER — HYDROCODONE-ACETAMINOPHEN 7.5-325 MG PO TABS: ORAL_TABLET | ORAL | 0 refills | Status: DC

## 2022-08-11 NOTE — Progress Notes (Signed)
Prior auth completed online at covermymeds for patients opioid medication

## 2022-08-20 ENCOUNTER — Encounter: Payer: Self-pay | Admitting: Radiology

## 2022-08-24 ENCOUNTER — Encounter (HOSPITAL_COMMUNITY): Payer: Self-pay | Admitting: Radiology

## 2022-08-24 ENCOUNTER — Ambulatory Visit (HOSPITAL_COMMUNITY)
Admission: RE | Admit: 2022-08-24 | Discharge: 2022-08-24 | Disposition: A | Discharge status: Home / Self Care | Payer: Commercial Managed Care - PPO | Source: Ambulatory Visit | Attending: Internal Medicine | Admitting: Internal Medicine

## 2022-08-24 DIAGNOSIS — R1030 Lower abdominal pain, unspecified: Secondary | ICD-10-CM | POA: Insufficient documentation

## 2022-08-24 DIAGNOSIS — R6881 Early satiety: Secondary | ICD-10-CM | POA: Insufficient documentation

## 2022-08-24 DIAGNOSIS — I700 Atherosclerosis of aorta: Secondary | ICD-10-CM | POA: Diagnosis present

## 2022-08-24 DIAGNOSIS — R634 Abnormal weight loss: Secondary | ICD-10-CM | POA: Insufficient documentation

## 2022-08-24 MED ORDER — IOHEXOL 300 MG/ML  SOLN
100.0000 mL | Freq: Once | INTRAMUSCULAR | Status: AC | PRN
  Administered 2022-08-24: 80 mL via INTRAVENOUS

## 2022-08-25 ENCOUNTER — Ambulatory Visit: Payer: Medicaid Other | Admitting: Orthopaedic Surgery

## 2022-09-01 ENCOUNTER — Ambulatory Visit (INDEPENDENT_AMBULATORY_CARE_PROVIDER_SITE_OTHER): Payer: Commercial Managed Care - PPO | Admitting: Orthopaedic Surgery

## 2022-09-01 ENCOUNTER — Encounter: Payer: Self-pay | Admitting: Orthopaedic Surgery

## 2022-09-01 VITALS — BP 111/65 | HR 82 | Ht 63.0 in | Wt 116.0 lb

## 2022-09-01 DIAGNOSIS — R634 Abnormal weight loss: Secondary | ICD-10-CM | POA: Diagnosis not present

## 2022-09-01 DIAGNOSIS — M545 Low back pain, unspecified: Secondary | ICD-10-CM | POA: Diagnosis not present

## 2022-09-01 DIAGNOSIS — R29898 Other symptoms and signs involving the musculoskeletal system: Secondary | ICD-10-CM | POA: Diagnosis not present

## 2022-09-01 DIAGNOSIS — M25562 Pain in left knee: Secondary | ICD-10-CM

## 2022-09-01 DIAGNOSIS — G8929 Other chronic pain: Secondary | ICD-10-CM

## 2022-09-01 NOTE — Patient Instructions (Signed)
Physical therapy has been ordered for you in [place] . You will need to call and schedule the phone number is [phone]

## 2022-09-01 NOTE — Progress Notes (Signed)
I have lost 30 pounds and I did not try.  She is being evaluated for weight loss.  It may be related to hyperthyroid problem.  Her back pain is worse at times   She has no new trauma.  She has no weakness.  Spine/Pelvis examination:  Inspection:  Overall, sacoiliac joint benign and hips nontender; without crepitus or defects.   Thoracic spine inspection: Alignment normal without kyphosis present   Lumbar spine inspection:  Alignment  with normal lumbar lordosis, without scoliosis apparent.   Thoracic spine palpation:  without tenderness of spinal processes   Lumbar spine palpation: without tenderness of lumbar area; without tightness of lumbar muscles    Range of Motion:   Lumbar flexion, forward flexion is normal without pain or tenderness    Lumbar extension is full without pain or tenderness   Left lateral bend is normal without pain or tenderness   Right lateral bend is normal without pain or tenderness   Straight leg raising is normal  Strength & tone: normal   Stability overall normal stability  Encounter Diagnoses  Name Primary?   Chronic midline low back pain without sciatica Yes   Chronic pain of left knee    Weight loss    Severe muscle deconditioning     She is deconditioned.  I will begin PT.  Return in six week.s  Call for medicine refill next week.  Call if any problem.  Precautions discussed.  Electronically Signed Sanjuana Kava, MD 3/12/20242:38 PM

## 2022-09-07 ENCOUNTER — Telehealth: Payer: Self-pay | Admitting: Orthopaedic Surgery

## 2022-09-08 ENCOUNTER — Other Ambulatory Visit: Payer: Self-pay | Admitting: Orthopaedic Surgery

## 2022-09-08 MED ORDER — HYDROCODONE-ACETAMINOPHEN 7.5-325 MG PO TABS: ORAL_TABLET | ORAL | 0 refills | Status: DC

## 2022-09-14 ENCOUNTER — Institutional Professional Consult (permissible substitution): Payer: Medicaid Other | Admitting: Internal Medicine

## 2022-09-29 ENCOUNTER — Ambulatory Visit: Payer: Medicaid Other | Admitting: Nurse Practitioner

## 2022-10-01 LAB — TSH: TSH: 0.097 u[IU]/mL — ABNORMAL LOW (ref 0.450–4.500)

## 2022-10-01 LAB — T4, FREE: Free T4: 1.63 ng/dL (ref 0.82–1.77)

## 2022-10-05 ENCOUNTER — Telehealth: Payer: Self-pay | Admitting: Orthopaedic Surgery

## 2022-10-05 MED ORDER — HYDROCODONE-ACETAMINOPHEN 7.5-325 MG PO TABS: ORAL_TABLET | ORAL | 0 refills | Status: DC

## 2022-10-06 ENCOUNTER — Encounter: Payer: Self-pay | Admitting: Nurse Practitioner

## 2022-10-13 ENCOUNTER — Ambulatory Visit: Payer: Commercial Managed Care - PPO | Admitting: Orthopaedic Surgery

## 2022-10-19 ENCOUNTER — Ambulatory Visit: Payer: Commercial Managed Care - PPO | Admitting: Orthopaedic Surgery

## 2022-10-20 ENCOUNTER — Encounter: Payer: Self-pay | Admitting: "Endocrinology

## 2022-10-20 ENCOUNTER — Ambulatory Visit (INDEPENDENT_AMBULATORY_CARE_PROVIDER_SITE_OTHER): Payer: Commercial Managed Care - PPO | Admitting: "Endocrinology

## 2022-10-20 VITALS — BP 84/58 | HR 84 | Ht 63.0 in | Wt 113.8 lb

## 2022-10-20 DIAGNOSIS — E782 Mixed hyperlipidemia: Secondary | ICD-10-CM | POA: Diagnosis not present

## 2022-10-20 DIAGNOSIS — E349 Endocrine disorder, unspecified: Secondary | ICD-10-CM | POA: Diagnosis not present

## 2022-10-20 DIAGNOSIS — E038 Other specified hypothyroidism: Secondary | ICD-10-CM | POA: Diagnosis not present

## 2022-10-20 DIAGNOSIS — E270 Other adrenocortical overactivity: Secondary | ICD-10-CM | POA: Insufficient documentation

## 2022-10-20 MED ORDER — LEVOTHYROXINE SODIUM 50 MCG PO TABS: 50.0000 ug | ORAL_TABLET | Freq: Every day | ORAL | 1 refills | Status: DC

## 2022-10-20 NOTE — Progress Notes (Unsigned)
10/20/2022        Endocrinology follow-up note    Subjective:    Patient ID: Vanessa Bowen, female    DOB: July 19, 1975, PCP Elfredia Nevins, MD   Past Medical History:  Diagnosis Date   Anxiety    Arthritis    Chronic back pain    Colitis, ulcerative (HCC)    Depression    History of kidney stones    2011   Hypothyroidism    Migraine    no longer has migraines   NAUSEA WITH VOMITING 09/23/2009   Pneumonia    Past Surgical History:  Procedure Laterality Date   ANKLE SURGERY     BREAST ENHANCEMENT SURGERY     CESAREAN SECTION     KNEE SURGERY Right    arthroscopy   LAPAROSCOPIC BILATERAL SALPINGECTOMY  07/27/2012   Procedure: LAPAROSCOPIC BILATERAL SALPINGECTOMY;  Surgeon: Lazaro Arms, MD;  Location: [facility];  Service: Gynecology;  Laterality: N/A;   OOPHORECTOMY Right 09/09/2016   Procedure: RIGHT OOPHORECTOMY;  Surgeon: Lazaro Arms, MD;  Location: [facility];  Service: Gynecology;  Laterality: Right;   OVARIAN CYST REMOVAL     TOOTH EXTRACTION N/A 01/30/2022   Procedure: DENTAL RESTORATION/EXTRACTIONS;  Surgeon: Ocie Doyne, DMD;  Location: [facility];  Service: Oral Surgery;  Laterality: N/A;   VAGINAL HYSTERECTOMY N/A 09/09/2016   Procedure: HYSTERECTOMY VAGINAL;  Surgeon: Lazaro Arms, MD;  Location: [facility];  Service: Gynecology;  Laterality: N/A;   Social History   Socioeconomic History   Marital status: Legally Separated    Spouse name: Not on file   Number of children: 2   Years of education: Not on file   Highest education level: Not on file  Occupational History   Not on file  Tobacco Use   Smoking status: Every Day    Packs/day: 0.50    Years: 15.00    Additional pack years: 0.00    Total pack years: 7.50    Types: Cigarettes   Smokeless tobacco: Never  Vaping Use   Vaping Use: Never used  Substance and Sexual Activity   Alcohol use: Yes    Comment: rare   Drug use: No   Sexual activity: Not Currently    Birth control/protection: Surgical     Comment: hyst  Other Topics Concern   Not on file  Social History Narrative   Not on file   Social Determinants of Health   Financial Resource Strain: Not on file  Food Insecurity: Not on file  Transportation Needs: Not on file  Physical Activity: Not on file  Stress: Not on file  Social Connections: Not on file   Outpatient Encounter Medications as of 10/20/2022  Medication Sig   HYDROcodone-acetaminophen (NORCO) 7.5-325 MG tablet Take one tablet by mouth every six hours as needed for pain.  Must last 28 days.   ALPRAZolam (XANAX) 1 MG tablet Take 1mg  po bid prn for anxiety for 1 month, then take 1mg  daily as needed for anxiety for 1 month and then discontinue completely. (Patient taking differently: Take 1 mg by mouth 4 (four) times daily as needed for anxiety.)   clindamycin (CLEOCIN-T) 1 % external solution Apply topically 2 (two) times daily. (Patient taking differently: Apply 1 Application topically 2 (two) times daily as needed (Hidradentis Suppurativa).)   ibuprofen (ADVIL,MOTRIN) 200 MG tablet Take 400-600 mg by mouth every 8 (eight) hours as needed (for pain/headaches.).   levothyroxine (SYNTHROID) 50 MCG tablet Take  1 tablet (50 mcg total) by mouth daily before breakfast.   MOVANTIK 25 MG TABS tablet Take 25 mg by mouth daily. (Patient not taking: Reported on 10/20/2022)   tiZANidine (ZANAFLEX) 4 MG tablet Take 4 mg by mouth every 6 (six) hours as needed for muscle spasms.    traZODone (DESYREL) 150 MG tablet Take 300 mg by mouth at bedtime.   [DISCONTINUED] dicyclomine (BENTYL) 20 MG tablet Take 1 tablet (20 mg total) by mouth 3 (three) times daily as needed for up to 30 days for spasms.   [DISCONTINUED] levothyroxine (SYNTHROID) 88 MCG tablet Take 1 tablet (88 mcg total) by mouth daily before breakfast.   [DISCONTINUED] topiramate (TOPAMAX) 100 MG tablet Take 100 mg by mouth at bedtime. (Patient not taking: Reported on 10/20/2022)   No facility-administered encounter  medications on file as of 10/20/2022.   ALLERGIES: Allergies  Allergen Reactions   Doxycycline Nausea And Vomiting   Sulfonamide Derivatives Nausea And Vomiting   VACCINATION STATUS:  There is no immunization history on file for this patient.  Thyroid Problem Presents for follow-up visit. Symptoms include anxiety, depressed mood, fatigue, palpitations and weight loss (intentional). Patient reports no cold intolerance, constipation, diarrhea, heat intolerance or tremors. 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    47 yr old female with medical history.  She is returning with repeat thyroid function test for follow-up of longstanding hypothyroidism.    She has hypothyroidism diagnosed at approximate age of 29 years.  She is currently on Levothyroxine 100 mcg daily.     Review of systems  Constitutional: + steadily decreasing body weight-cannot chew much due to ill fitting dentures,  current Body mass index is 20.16 kg/m. , no fatigue, no subjective hyperthermia, no subjective hypothermia Eyes: no blurry vision, no xerophthalmia ENT: no sore throat, no nodules palpated in throat, no dysphagia/odynophagia, no hoarseness Cardiovascular: no chest pain, no shortness of breath, + palpitations-improving somewhat, no leg swelling Respiratory: no cough, no shortness of breath Gastrointestinal: no nausea/vomiting/diarrhea Musculoskeletal: no muscle/joint aches Skin: no rashes, no hyperemia Neurological: no tremors, no numbness, no tingling, no dizziness Psychiatric: no depression, no anxiety    Objective:    BP (!) 84/58   Pulse 84   Ht 5\' 3"  (1.6 m)   Wt 113 lb 12.8 oz (51.6 kg)   LMP 07/24/2016   BMI 20.16 kg/m   Wt Readings from Last 3 Encounters:  10/20/22 113 lb 12.8 oz (51.6 kg)  09/01/22 116 lb (52.6 kg)  07/28/22 124 lb 3.2 oz (56.3 kg)    BP Readings from Last 3 Encounters:  10/20/22  (!) 84/58  09/01/22 111/65  07/28/22 108/70     Physical Exam- Limited  Constitutional:  Body mass index is 20.16 kg/m. , not in acute distress, normal state of mind Eyes:  EOMI, no exophthalmos Musculoskeletal: no gross deformities, strength intact in all four extremities, no gross restriction of joint movements Skin:  no rashes, no hyperemia Neurological: no tremor with outstretched hands    CMP     Component Value Date/Time   NA 136 10/09/2019 1442   K 4.0 10/09/2019 1442   CL 101 10/09/2019 1442   CO2 25 10/09/2019 1442   GLUCOSE 123 (H) 10/09/2019 1442   BUN 15 10/09/2019 1442   CREATININE 0.81 10/09/2019 1442   CREATININE 1.05 04/27/2017 1226   CALCIUM 9.6 10/09/2019 1442   PROT 7.6 10/09/2019 1442  ALBUMIN 4.5 10/09/2019 1442   AST 14 (L) 10/09/2019 1442   ALT 14 10/09/2019 1442   ALKPHOS 57 10/09/2019 1442   BILITOT 0.6 10/09/2019 1442   GFRNONAA >60 10/09/2019 1442   GFRAA >60 10/09/2019 1442   Recent Results (from the past 2160 hour(s))  TSH     Status: Abnormal   Collection Time: 09/30/22  7:56 AM  Result Value Ref Range   TSH 0.097 (L) 0.450 - 4.500 uIU/mL  T4, free     Status: None   Collection Time: 09/30/22  7:56 AM  Result Value Ref Range   Free T4 1.63 0.82 - 1.77 ng/dL    Latest Reference Range & Units 07/21/21 12:06 01/12/22 10:55 04/14/22 11:01 06/11/22 13:17  TSH 0.450 - 4.500 uIU/mL 0.368 (L) 0.163 (L) 0.029 (L) 0.020 (L)  T4,Free(Direct) 0.82 - 1.77 ng/dL 1.61 0.96 (H) 0.45 4.09  (L): Data is abnormally low (H): Data is abnormally high Assessment & Plan:   1. Hypothyroidism  -Her previsit thyroid function tests are consistent with slight over-replacement (TSH slightly suppressed but Free T4 high normal).  She reports improvement in her symptoms, not yet resolved (likely due to continued weight loss from recent dental procedures and ill fitting dentures).  She is advised to lower her Levothyroxine to 88 mcg po daily before breakfast.     - We discussed about the correct intake of her thyroid hormone, on empty stomach at fasting, with water, separated by at least 30 minutes from breakfast and other medications,  and separated by more than 4 hours from calcium, iron, multivitamins, acid reflux medications (PPIs). -Patient is made aware of the fact that thyroid hormone replacement is needed for life, dose to be adjusted by periodic monitoring of thyroid function tests.  - I advised patient to maintain close follow up with Elfredia Nevins, MD for primary care needs.     I spent  24  minutes in the care of the patient today including review of labs from Thyroid Function, CMP, and other relevant labs ; imaging/biopsy records (current and previous including abstractions from other facilities); face-to-face time discussing  her lab results and symptoms, medications doses, her options of short and long term treatment based on the latest standards of care / guidelines;   and documenting the encounter.  Beverly Milch  participated in the discussions, expressed understanding, and voiced agreement with the above plans.  All questions were answered to her satisfaction. she is encouraged to contact clinic should she have any questions or concerns prior to her return visit.   Follow up plan: Return in about 3 months (around 01/19/2023), or she can do the 24 hour urine test anytime, for Fasting Labs  in AM B4 8, 24 Hr Urine Free Cortisol & Cr.  Ronny Bacon, [facility] [address] [place], [place] [address] Phone: [phone] Fax: [phone]  10/20/2022, 6:33 PM

## 2022-10-27 ENCOUNTER — Encounter: Payer: Self-pay | Admitting: Orthopaedic Surgery

## 2022-10-27 ENCOUNTER — Ambulatory Visit (INDEPENDENT_AMBULATORY_CARE_PROVIDER_SITE_OTHER): Payer: Commercial Managed Care - PPO | Admitting: Orthopaedic Surgery

## 2022-10-27 VITALS — BP 88/63 | HR 81 | Ht 63.0 in | Wt 113.0 lb

## 2022-10-27 DIAGNOSIS — F17210 Nicotine dependence, cigarettes, uncomplicated: Secondary | ICD-10-CM | POA: Diagnosis not present

## 2022-10-27 DIAGNOSIS — G894 Chronic pain syndrome: Secondary | ICD-10-CM

## 2022-10-27 DIAGNOSIS — E038 Other specified hypothyroidism: Secondary | ICD-10-CM

## 2022-10-27 DIAGNOSIS — M545 Low back pain, unspecified: Secondary | ICD-10-CM

## 2022-10-27 DIAGNOSIS — G8929 Other chronic pain: Secondary | ICD-10-CM

## 2022-10-27 DIAGNOSIS — R29898 Other symptoms and signs involving the musculoskeletal system: Secondary | ICD-10-CM | POA: Diagnosis not present

## 2022-10-27 DIAGNOSIS — R634 Abnormal weight loss: Secondary | ICD-10-CM

## 2022-10-27 MED ORDER — CYCLOBENZAPRINE HCL 10 MG PO TABS: 10.0000 mg | ORAL_TABLET | Freq: Every day | ORAL | 0 refills | Status: DC

## 2022-10-27 NOTE — Progress Notes (Signed)
I am losing weight  She has been evaluated thoroughly by her family doctor and found to have thyroid problems.  She has been referred to Dr. Fransico Him who is working with her.  She has lost weight secondary to this.  She has no signs of cancer by scans and lab work.  Her lower back is still tender.  She has not set up PT yet secondary to the testing she has done.  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  She is not sleeping well.  Spine/Pelvis examination:  Inspection:  Overall, sacoiliac joint benign and hips nontender; without crepitus or defects.   Thoracic spine inspection: Alignment normal without kyphosis present   Lumbar spine inspection:  Alignment  with normal lumbar lordosis, without scoliosis apparent.   Thoracic spine palpation:  without tenderness of spinal processes   Lumbar spine palpation: without tenderness of lumbar area; without tightness of lumbar muscles    Range of Motion:   Lumbar flexion, forward flexion is normal without pain or tenderness    Lumbar extension is full without pain or tenderness   Left lateral bend is normal without pain or tenderness   Right lateral bend is normal without pain or tenderness   Straight leg raising is normal  Strength & tone: normal   Stability overall normal stability  Encounter Diagnoses  Name Primary?   Chronic midline low back pain without sciatica Yes   Weight loss    Severe muscle deconditioning    Cigarette nicotine dependence without complication    Chronic pain syndrome    Other specified hypothyroidism    I will change her from Zanaflex to Flexeril.  Call in next week for refill on pain medicine.  Return in six week.  Do a walking program as able for her back.  Call if any problem.  Precautions discussed.  Electronically Signed Darreld Mclean, MD 5/7/20243:38 PM

## 2022-11-02 ENCOUNTER — Telehealth: Payer: Self-pay | Admitting: Orthopaedic Surgery

## 2022-11-03 MED ORDER — HYDROCODONE-ACETAMINOPHEN 7.5-325 MG PO TABS: ORAL_TABLET | ORAL | 0 refills | Status: DC

## 2022-11-26 ENCOUNTER — Telehealth: Payer: Self-pay | Admitting: Orthopaedic Surgery

## 2022-11-30 ENCOUNTER — Telehealth: Payer: Self-pay | Admitting: Orthopaedic Surgery

## 2022-12-01 ENCOUNTER — Telehealth: Payer: Self-pay | Admitting: Orthopaedic Surgery

## 2022-12-01 MED ORDER — HYDROCODONE-ACETAMINOPHEN 7.5-325 MG PO TABS: ORAL_TABLET | ORAL | 0 refills | Status: DC

## 2022-12-01 NOTE — Telephone Encounter (Signed)
Dr. Sanjuan Dame pt - spoke w/the patient, requesting a refill on Hydrocodone 7.5-325 to be sent to [facility].

## 2022-12-07 ENCOUNTER — Other Ambulatory Visit: Payer: Self-pay | Admitting: "Endocrinology

## 2022-12-07 MED ORDER — LEVOTHYROXINE SODIUM 75 MCG PO TABS: 75.0000 ug | ORAL_TABLET | Freq: Every day | ORAL | 1 refills | Status: DC

## 2022-12-08 ENCOUNTER — Ambulatory Visit: Payer: Commercial Managed Care - PPO | Admitting: Orthopaedic Surgery

## 2022-12-15 ENCOUNTER — Encounter: Payer: Self-pay | Admitting: Orthopaedic Surgery

## 2022-12-15 ENCOUNTER — Ambulatory Visit (INDEPENDENT_AMBULATORY_CARE_PROVIDER_SITE_OTHER): Payer: Commercial Managed Care - PPO | Admitting: Orthopaedic Surgery

## 2022-12-15 VITALS — BP 107/62 | HR 94 | Ht 63.0 in | Wt 118.0 lb

## 2022-12-15 DIAGNOSIS — E038 Other specified hypothyroidism: Secondary | ICD-10-CM | POA: Diagnosis not present

## 2022-12-15 DIAGNOSIS — R634 Abnormal weight loss: Secondary | ICD-10-CM | POA: Diagnosis not present

## 2022-12-15 DIAGNOSIS — G894 Chronic pain syndrome: Secondary | ICD-10-CM | POA: Diagnosis not present

## 2022-12-15 DIAGNOSIS — M545 Low back pain, unspecified: Secondary | ICD-10-CM

## 2022-12-15 NOTE — Progress Notes (Signed)
I am no better.  She has thyroid problems and the endocrinologist is working with her for this.  She is not sleeping well.  Her father is ill and she is the only caregiver.  He has been hospitalized recently and is back home.  He needs round the clock care.  She is just worn out from this.  Her back is hurting.  She gets little rest.  She has no new trauma.  She has gained five pounds since last here.  Spine/Pelvis examination:  Inspection:  Overall, sacoiliac joint benign and hips nontender; without crepitus or defects.   Thoracic spine inspection: Alignment normal without kyphosis present   Lumbar spine inspection:  Alignment  with normal lumbar lordosis, without scoliosis apparent.   Thoracic spine palpation:  without tenderness of spinal processes   Lumbar spine palpation: without tenderness of lumbar area; without tightness of lumbar muscles    Range of Motion:   Lumbar flexion, forward flexion is normal without pain or tenderness    Lumbar extension is full without pain or tenderness   Left lateral bend is normal without pain or tenderness   Right lateral bend is normal without pain or tenderness   Straight leg raising is normal  Strength & tone: normal   Stability overall normal stability  Encounter Diagnoses  Name Primary?   Chronic midline low back pain without sciatica Yes   Weight loss    Chronic pain syndrome    Other specified hypothyroidism    I have spent some time with her.  She needs to get someone in the house to help her or have her father placed in a skilled nursing unit for a few weeks to a month.  She needs to get some rest and relief.  I have given suggestions to her.  Continue present medicines.  Return in three weeks.  Call if any problem.  Precautions discussed.  Electronically Signed Darreld Mclean, MD 6/25/20243:06 PM

## 2022-12-29 ENCOUNTER — Telehealth: Payer: Self-pay | Admitting: Orthopaedic Surgery

## 2022-12-29 ENCOUNTER — Other Ambulatory Visit: Payer: Self-pay | Admitting: Orthopaedic Surgery

## 2022-12-29 ENCOUNTER — Telehealth: Payer: Self-pay

## 2022-12-29 MED ORDER — HYDROCODONE-ACETAMINOPHEN 7.5-325 MG PO TABS: ORAL_TABLET | ORAL | 0 refills | Status: DC

## 2022-12-29 NOTE — Telephone Encounter (Signed)
Dr. Sanjuan Dame pt - spoke w/the patient, she stated that she tried to request a refill on mychart and thinks she selected the wrong medication.  She is requesting a refill on Hydrocodone 7.5-325 to be sent to [facility].  If denied she would like a call [phone].

## 2022-12-29 NOTE — Telephone Encounter (Signed)
error 

## 2022-12-29 NOTE — Telephone Encounter (Signed)
Hydrocodone-Acetaminophen 7.5/325 MG Qty 120 Tablets   PATIENT USES [facility]

## 2022-12-29 NOTE — Telephone Encounter (Signed)
Informed patient that her meds has been called in

## 2023-01-05 ENCOUNTER — Ambulatory Visit (INDEPENDENT_AMBULATORY_CARE_PROVIDER_SITE_OTHER): Payer: Commercial Managed Care - PPO | Admitting: Orthopaedic Surgery

## 2023-01-05 ENCOUNTER — Encounter: Payer: Self-pay | Admitting: Orthopaedic Surgery

## 2023-01-05 VITALS — BP 91/62 | HR 92 | Ht 63.0 in | Wt 119.0 lb

## 2023-01-05 DIAGNOSIS — R634 Abnormal weight loss: Secondary | ICD-10-CM | POA: Diagnosis not present

## 2023-01-05 DIAGNOSIS — R29898 Other symptoms and signs involving the musculoskeletal system: Secondary | ICD-10-CM

## 2023-01-05 DIAGNOSIS — G8929 Other chronic pain: Secondary | ICD-10-CM

## 2023-01-05 DIAGNOSIS — G894 Chronic pain syndrome: Secondary | ICD-10-CM | POA: Diagnosis not present

## 2023-01-05 DIAGNOSIS — M545 Low back pain, unspecified: Secondary | ICD-10-CM

## 2023-01-05 MED ORDER — CYCLOBENZAPRINE HCL 10 MG PO TABS: ORAL_TABLET | ORAL | 5 refills | Status: DC

## 2023-01-05 NOTE — Progress Notes (Signed)
My back hurts.  She has thyroid problems.  She is being treated.  Her weight is up to 119 now.  She does not sleep well.  Her back hurts but she has no weakness.  ROM of back is tender, muscle tone and strength normal, gait normal, NV intact.  Encounter Diagnoses  Name Primary?   Chronic midline low back pain without sciatica Yes   Weight loss    Chronic pain syndrome    Severe muscle deconditioning    I will refill her Flexeril.  I have discussed plan of treatment and exercise.  Return in two months.  Call if any problem.  Precautions discussed.  Electronically Signed Darreld Mclean, MD 7/16/20242:46 PM

## 2023-01-05 NOTE — Patient Instructions (Addendum)
When you start to feel better walking in a pool or swimming may help your back pain   Smoking Tobacco Information, Adult Smoking tobacco can be harmful to your health. Tobacco contains a toxic colorless chemical called nicotine. Nicotine causes changes in your brain that make you want more and more. This is called addiction. This can make it hard to stop smoking once you start. Tobacco also has other toxic chemicals that can hurt your body and raise your risk of many cancers. Menthol or "lite" tobacco or cigarette brands are not safer than regular brands. How can smoking tobacco affect me? Smoking tobacco puts you at risk for: Cancer. Smoking is most commonly associated with lung cancer, but can also lead to cancer in other parts of the body. Chronic obstructive pulmonary disease (COPD). This is a long-term lung condition that makes it hard to breathe. It also gets worse over time. High blood pressure (hypertension), heart disease, stroke, heart attack, and lung infections, such as pneumonia. Cataracts. This is when the lenses in the eyes become clouded. Digestive problems. This may include peptic ulcers, heartburn, and gastroesophageal reflux disease (GERD). Oral health problems, such as gum disease, mouth sores, and tooth loss. Loss of taste and smell. Smoking also affects how you look and smell. Smoking may cause: Wrinkles. Yellow or stained teeth, fingers, and fingernails. Bad breath. Bad-smelling clothes and hair. Smoking tobacco can also affect your social life, because: It may be challenging to find places to smoke when away from home. Many workplaces, [employer], hotels, and public places are tobacco-free. Smoking is expensive. This is due to the cost of tobacco and the long-term costs of treating health problems from smoking. Secondhand smoke may affect those around you. Secondhand smoke can cause lung cancer, breathing problems, and heart disease. Children of smokers have a higher  risk for: Sudden infant death syndrome (SIDS). Ear infections. Lung infections. What actions can I take to prevent health problems? Quit smoking  Do not start smoking. Quit if you already smoke. Do not replace cigarette smoking with vaping devices, such as e-cigarettes. Make a plan to quit smoking and commit to it. Look for programs to help you, and ask your health care provider for recommendations and ideas. Set a date and write down all the reasons you want to quit. Let your friends and family know you are quitting so they can help and support you. Consider finding friends who also want to quit. It can be easier to quit with someone else, so that you can support each other. Talk with your health care provider about using nicotine replacement medicines to help you quit. These include gum, lozenges, patches, sprays, or pills. If you try to quit but return to smoking, stay positive. It is common to slip up when you first quit, so take it one day at a time. Be prepared for cravings. When you feel the urge to smoke, chew gum or suck on hard candy. Lifestyle Stay busy. Take care of your body. Get plenty of exercise, eat a healthy diet, and drink plenty of water. Find ways to manage your stress, such as meditation, yoga, exercise, or time spent with friends and family. Ask your health care provider about having regular tests (screenings) to check for cancer. This may include blood tests, imaging tests, and other tests. Where to find support To get support to quit smoking, consider: Asking your health care provider for more information and resources. Joining a support group for people who want to quit smoking  in your local community. There are many effective programs that may help you to quit. Calling the smokefree.gov counselor helpline at 1-800-QUIT-NOW (253)584-5244). Where to find more information You may find more information about quitting smoking from: Centers for Disease Control and  Prevention: http://www.osborne.com/ [employer]: smokefree.gov American Lung Association: freedomfromsmoking.org Contact a health care provider if: You have problems breathing. Your lips, nose, or fingers turn blue. You have chest pain. You are coughing up blood. You feel like you will faint. You have other health changes that cause you to worry. Summary Smoking tobacco can negatively affect your health, the health of those around you, your finances, and your social life. Do not start smoking. Quit if you already smoke. If you need help quitting, ask your health care provider. Consider joining a support group for people in your local community who want to quit smoking. There are many effective programs that may help you to quit. This information is not intended to replace advice given to you by your health care provider. Make sure you discuss any questions you have with your health care provider. Document Revised: 06/03/2021 Document Reviewed: 06/03/2021 Elsevier Patient Education  2024 [employer].

## 2023-01-26 ENCOUNTER — Ambulatory Visit (INDEPENDENT_AMBULATORY_CARE_PROVIDER_SITE_OTHER): Payer: Commercial Managed Care - PPO | Admitting: "Endocrinology

## 2023-01-26 ENCOUNTER — Telehealth: Payer: Self-pay | Admitting: Orthopaedic Surgery

## 2023-01-26 ENCOUNTER — Encounter: Payer: Self-pay | Admitting: "Endocrinology

## 2023-01-26 VITALS — BP 92/64 | HR 76 | Ht 63.0 in | Wt 122.0 lb

## 2023-01-26 DIAGNOSIS — E349 Endocrine disorder, unspecified: Secondary | ICD-10-CM

## 2023-01-26 DIAGNOSIS — E038 Other specified hypothyroidism: Secondary | ICD-10-CM

## 2023-01-26 DIAGNOSIS — E782 Mixed hyperlipidemia: Secondary | ICD-10-CM | POA: Diagnosis not present

## 2023-01-26 MED ORDER — LEVOTHYROXINE SODIUM 88 MCG PO TABS: 88.0000 ug | ORAL_TABLET | Freq: Every day | ORAL | 1 refills | Status: DC

## 2023-01-26 NOTE — Progress Notes (Signed)
01/26/2023        Endocrinology follow-up note    Subjective:    Patient ID: Vanessa Bowen, female    DOB: [birth year], PCP Elfredia Nevins, MD   Past Medical History:  Diagnosis Date   Anxiety    Arthritis    Chronic back pain    Colitis, ulcerative (HCC)    Depression    History of kidney stones    2011   Hypothyroidism    Migraine    no longer has migraines   NAUSEA WITH VOMITING 09/23/2009   Pneumonia    Past Surgical History:  Procedure Laterality Date   ANKLE SURGERY     BREAST ENHANCEMENT SURGERY     CESAREAN SECTION     KNEE SURGERY Right    arthroscopy   LAPAROSCOPIC BILATERAL SALPINGECTOMY  07/27/2012   Procedure: LAPAROSCOPIC BILATERAL SALPINGECTOMY;  Surgeon: Lazaro Arms, MD;  Location: [facility];  Service: Gynecology;  Laterality: N/A;   OOPHORECTOMY Right 09/09/2016   Procedure: RIGHT OOPHORECTOMY;  Surgeon: Lazaro Arms, MD;  Location: [facility];  Service: Gynecology;  Laterality: Right;   OVARIAN CYST REMOVAL     TOOTH EXTRACTION N/A 01/30/2022   Procedure: DENTAL RESTORATION/EXTRACTIONS;  Surgeon: Ocie Doyne, DMD;  Location: [facility];  Service: Oral Surgery;  Laterality: N/A;   VAGINAL HYSTERECTOMY N/A 09/09/2016   Procedure: HYSTERECTOMY VAGINAL;  Surgeon: Lazaro Arms, MD;  Location: [facility];  Service: Gynecology;  Laterality: N/A;   Social History   Socioeconomic History   Marital status: Legally Separated    Spouse name: Not on file   Number of children: 2   Years of education: Not on file   Highest education level: Not on file  Occupational History   Not on file  Tobacco Use   Smoking status: Every Day    Current packs/day: 0.50    Average packs/day: 0.5 packs/day for 15.0 years (7.5 ttl pk-yrs)    Types: Cigarettes   Smokeless tobacco: Never  Vaping Use   Vaping status: Never Used  Substance and Sexual Activity   Alcohol use: Yes    Comment: rare   Drug use: No   Sexual activity: Not Currently    Birth control/protection: Surgical     Comment: hyst  Other Topics Concern   Not on file  Social History Narrative   Not on file   Social Determinants of Health   Financial Resource Strain: Not on file  Food Insecurity: Not on file  Transportation Needs: Not on file  Physical Activity: Not on file  Stress: Not on file  Social Connections: Unknown (12/30/2021)   Received from [facility]   Social Network    Social Network: Not on file   Outpatient Encounter Medications as of 01/26/2023  Medication Sig   ALPRAZolam (XANAX) 1 MG tablet Take 1mg  po bid prn for anxiety for 1 month, then take 1mg  daily as needed for anxiety for 1 month and then discontinue completely. (Patient taking differently: Take 1 mg by mouth 4 (four) times daily as needed for anxiety.)   clindamycin (CLEOCIN-T) 1 % external solution Apply topically 2 (two) times daily.   cyclobenzaprine (FLEXERIL) 10 MG tablet Take at bedtime as needed for spasm.   ibuprofen (ADVIL,MOTRIN) 200 MG tablet Take 400-600 mg by mouth every 8 (eight) hours as needed (for pain/headaches.).   levothyroxine (SYNTHROID) 88 MCG tablet Take 1 tablet (88 mcg total) by mouth daily before breakfast.  MOVANTIK 25 MG TABS tablet Take 25 mg by mouth daily.   traZODone (DESYREL) 150 MG tablet Take 300 mg by mouth at bedtime.   [DISCONTINUED] dicyclomine (BENTYL) 20 MG tablet Take 1 tablet (20 mg total) by mouth 3 (three) times daily as needed for up to 30 days for spasms.   [DISCONTINUED] HYDROcodone-acetaminophen (NORCO) 7.5-325 MG tablet Take one tablet by mouth every six hours as needed for pain.  Must last 28 days.   [DISCONTINUED] levothyroxine (SYNTHROID) 75 MCG tablet Take 1 tablet (75 mcg total) by mouth daily before breakfast.   No facility-administered encounter medications on file as of 01/26/2023.   ALLERGIES: Allergies  Allergen Reactions   Doxycycline Nausea And Vomiting   Sulfonamide Derivatives Nausea And Vomiting   VACCINATION STATUS:  There is no  immunization history on file for this patient.  She is here to follow-up for hypothyroidism, hyperlipidemia, and previously documented hypocortisolemia.  She is returning with repeat thyroid function test consistent with slight under replacement.  She is currently on levothyroxine 75 mcg p.o. daily before breakfast.   She is not on steroids now.  She has gained 10+ pounds since last visit.  This is considered appropriate response.   She has hypothyroidism diagnosed at approximate age of 29 years.  She has hyperlipidemia, not on statins.  She runs low blood pressure at 92/64, slightly better than her last presentation.   She reports eating enough.  She is not on blood pressure medications.  Her other medications include trazodone, tizanidine, Norco.   Review of systems  Constitutional: + Steady weight gain, appropriate response.  Better BMI of 21.6.   Objective:    BP 92/64   Pulse 76   Ht 5\' 3"  (1.6 m)   Wt 122 lb (55.3 kg)   LMP 07/24/2016   BMI 21.61 kg/m   Wt Readings from Last 3 Encounters:  01/26/23 122 lb (55.3 kg)  01/05/23 119 lb (54 kg)  12/15/22 118 lb (53.5 kg)    BP Readings from Last 3 Encounters:  01/26/23 92/64  01/05/23 91/62  12/15/22 107/62     Physical Exam- Limited  Constitutional:  Body mass index is 21.61 kg/m. , not in acute distress, + dysphoric mood.     CMP     Component Value Date/Time   NA 139 01/20/2023 1100   K 4.2 01/20/2023 1100   CL 102 01/20/2023 1100   CO2 23 01/20/2023 1100   GLUCOSE 80 01/20/2023 1100   GLUCOSE 123 (H) 10/09/2019 1442   BUN 10 01/20/2023 1100   CREATININE 1.03 (H) 01/20/2023 1100   CREATININE 1.05 04/27/2017 1226   CALCIUM 9.6 01/20/2023 1100   PROT 6.8 01/20/2023 1100   ALBUMIN 4.6 01/20/2023 1100   AST 29 01/20/2023 1100   ALT 43 (H) 01/20/2023 1100   ALKPHOS 65 01/20/2023 1100   BILITOT 0.2 01/20/2023 1100   GFRNONAA >60 10/09/2019 1442   GFRAA >60 10/09/2019 1442   Recent Results (from the  past 2160 hour(s))  Creatinine, urine, 24 hour     Status: None   Collection Time: 01/19/23  1:13 PM  Result Value Ref Range   Creatinine, Urine 68.3 Not Estab. mg/dL   Creatinine, 16X Ur 0,960 800 - 1,800 mg/24 hr  Cortisol, urine, free     Status: None   Collection Time: 01/19/23  1:16 PM  Result Value Ref Range   Cortisol,F,ug/L,U 8 Undefined ug/L   Cortisol (Ur), Free 13 6 - 42 ug/24 hr  TSH     Status: Abnormal   Collection Time: 01/20/23 11:00 AM  Result Value Ref Range   TSH 10.900 (H) 0.450 - 4.500 uIU/mL  T4, free     Status: None   Collection Time: 01/20/23 11:00 AM  Result Value Ref Range   Free T4 1.16 0.82 - 1.77 ng/dL  Lipid panel     Status: None   Collection Time: 01/20/23 11:00 AM  Result Value Ref Range   Cholesterol, Total 133 100 - 199 mg/dL   Triglycerides 841 0 - 149 mg/dL   HDL 60 >32 mg/dL   VLDL Cholesterol Cal 19 5 - 40 mg/dL   LDL Chol Calc (NIH) 54 0 - 99 mg/dL   Chol/HDL Ratio 2.2 0.0 - 4.4 ratio    Comment:                                   T. Chol/HDL Ratio                                             Men  Women                               1/2 Avg.Risk  3.4    3.3                                   Avg.Risk  5.0    4.4                                2X Avg.Risk  9.6    7.1                                3X Avg.Risk 23.4   11.0   Comprehensive metabolic panel     Status: Abnormal   Collection Time: 01/20/23 11:00 AM  Result Value Ref Range   Glucose 80 70 - 99 mg/dL   BUN 10 6 - 24 mg/dL   Creatinine, Ser 4.40 (H) 0.57 - 1.00 mg/dL   eGFR 67 >10 UV/OZD/6.64   BUN/Creatinine Ratio 10 9 - 23   Sodium 139 134 - 144 mmol/L   Potassium 4.2 3.5 - 5.2 mmol/L   Chloride 102 96 - 106 mmol/L   CO2 23 20 - 29 mmol/L   Calcium 9.6 8.7 - 10.2 mg/dL   Total Protein 6.8 6.0 - 8.5 g/dL   Albumin 4.6 3.9 - 4.9 g/dL   Globulin, Total 2.2 1.5 - 4.5 g/dL   Bilirubin Total 0.2 0.0 - 1.2 mg/dL   Alkaline Phosphatase 65 44 - 121 IU/L   AST 29 0 - 40 IU/L    ALT 43 (H) 0 - 32 IU/L    Assessment & Plan:   1. Hypothyroidism   2.  Abnormal cortisol level  3.  Hyperlipidemia  -Her previsit thyroid function tests are consistent with slight under replacement.  She would benefit from slight increase in her levothyroxine.  I discussed an increase undercollection to 88 mcg p.o. daily   - We discussed about  the correct intake of her thyroid hormone, on empty stomach at fasting, with water, separated by at least 30 minutes from breakfast and other medications,  and separated by more than 4 hours from calcium, iron, multivitamins, acid reflux medications (PPIs). -Patient is made aware of the fact that thyroid hormone replacement is needed for life, dose to be adjusted by periodic monitoring of thyroid function tests.    Regarding her discordant cortisol level, not a diagnosis of adrenal insufficiency at this time.  Her previsit 24-hour urine free cortisol measurement revealed low normal cortisol level at 13. -She has a few risk for adrenal insufficiency.  She will be sent for ACTH stimulation test.  Her assessment of adrenal reserve.    Regarding her dyslipidemia , presents with improved LDL at 54 and 44 145.  She is advised to continue Crestor prescribed by her PMD.  - I advised patient to maintain close follow up with Elfredia Nevins, MD for primary care needs.   I spent  21  minutes in the care of the patient today including review of labs from Thyroid Function, CMP, and other relevant labs ; imaging/biopsy records (current and previous including abstractions from other facilities); face-to-face time discussing  her lab results and symptoms, medications doses, her options of short and long term treatment based on the latest standards of care / guidelines;   and documenting the encounter.  Beverly Milch  participated in the discussions, expressed understanding, and voiced agreement with the above plans.  All questions were answered to her satisfaction. she  is encouraged to contact clinic should she have any questions or concerns prior to her return visit.  Follow up plan: Return in about 3 months (around 04/28/2023), or ACTH Stim Test any time, for F/U with Pre-visit Labs.  Ronny Bacon, [facility] [address] [place], [place] [address] Phone: [phone] Fax: [phone]  01/26/2023, 3:28 PM

## 2023-01-29 ENCOUNTER — Other Ambulatory Visit: Payer: Self-pay

## 2023-02-11 ENCOUNTER — Telehealth: Payer: Self-pay

## 2023-02-11 NOTE — Telephone Encounter (Addendum)
Auth Submission: NO AUTH NEEDED Site of care: Site of care: [facility] Payer: [employer] PPO  Medication & CPT/J Code(s) submitted: [employer]in [id number] Route of submission (phone, fax, portal): phone/portal Phone # Fax # Auth type: Buy/Bill PB Units/visits requested: 0.25mg , 1 dose Reference number: [id number] Approval from: 02/12/23 to 06/14/23

## 2023-02-16 ENCOUNTER — Ambulatory Visit: Payer: Commercial Managed Care - PPO

## 2023-02-23 ENCOUNTER — Telehealth: Payer: Self-pay | Admitting: Orthopaedic Surgery

## 2023-03-09 ENCOUNTER — Ambulatory Visit: Payer: Commercial Managed Care - PPO | Admitting: Orthopaedic Surgery

## 2023-03-16 ENCOUNTER — Encounter: Payer: Self-pay | Admitting: Orthopaedic Surgery

## 2023-03-16 ENCOUNTER — Ambulatory Visit (INDEPENDENT_AMBULATORY_CARE_PROVIDER_SITE_OTHER): Payer: Commercial Managed Care - PPO | Admitting: Orthopaedic Surgery

## 2023-03-16 VITALS — BP 124/80 | HR 128 | Ht 63.0 in | Wt 116.0 lb

## 2023-03-16 DIAGNOSIS — G894 Chronic pain syndrome: Secondary | ICD-10-CM

## 2023-03-16 DIAGNOSIS — R29898 Other symptoms and signs involving the musculoskeletal system: Secondary | ICD-10-CM

## 2023-03-16 DIAGNOSIS — M545 Low back pain, unspecified: Secondary | ICD-10-CM
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
## 2023-03-22 ENCOUNTER — Telehealth: Payer: Self-pay | Admitting: Orthopaedic Surgery

## 2023-03-22 MED ORDER — HYDROCODONE-ACETAMINOPHEN 7.5-325 MG PO TABS: ORAL_TABLET | ORAL | 0 refills | Status: DC

## 2023-04-05 ENCOUNTER — Ambulatory Visit: Payer: Commercial Managed Care - PPO

## 2023-04-13 ENCOUNTER — Ambulatory Visit (INDEPENDENT_AMBULATORY_CARE_PROVIDER_SITE_OTHER): Payer: Commercial Managed Care - PPO | Admitting: Orthopaedic Surgery

## 2023-04-13 ENCOUNTER — Encounter: Payer: Self-pay | Admitting: Orthopaedic Surgery

## 2023-04-13 VITALS — BP 85/57 | HR 79 | Ht 63.0 in | Wt 123.0 lb

## 2023-04-13 DIAGNOSIS — G894 Chronic pain syndrome: Secondary | ICD-10-CM | POA: Diagnosis not present

## 2023-04-13 DIAGNOSIS — M545 Low back pain, unspecified: Secondary | ICD-10-CM | POA: Diagnosis not present

## 2023-04-13 DIAGNOSIS — G8929 Other chronic pain: Secondary | ICD-10-CM

## 2023-04-13 NOTE — Progress Notes (Signed)
I am still hurting.  She has chronic lower back pain.  She has less pain than the other time she was here.  She has more pain at the end of the day.  She has no new trauma. She is trying to be active and do her exercises.  She has no weakness.  Lower back is tender, decreased ROM, muscle tone and strength is normal.    Encounter Diagnoses  Name Primary?   Chronic midline low back pain without sciatica Yes   Chronic pain syndrome    Return in one month.  Call if any problem.  Precautions discussed.  Electronically Signed Darreld Mclean, MD 10/22/20241:37 PM

## 2023-04-19 ENCOUNTER — Telehealth: Payer: Self-pay | Admitting: Orthopaedic Surgery

## 2023-04-19 NOTE — Telephone Encounter (Signed)
Please send to Dr Kirtland Bouchard if he's in town this week

## 2023-04-20 MED ORDER — HYDROCODONE-ACETAMINOPHEN 7.5-325 MG PO TABS: ORAL_TABLET | ORAL | 0 refills | Status: DC

## 2023-05-09 ENCOUNTER — Other Ambulatory Visit: Payer: Self-pay | Admitting: "Endocrinology

## 2023-05-10 ENCOUNTER — Other Ambulatory Visit: Payer: Self-pay | Admitting: *Deleted

## 2023-05-10 DIAGNOSIS — E038 Other specified hypothyroidism: Secondary | ICD-10-CM

## 2023-05-10 DIAGNOSIS — E349 Endocrine disorder, unspecified: Secondary | ICD-10-CM

## 2023-05-10 MED ORDER — LEVOTHYROXINE SODIUM 88 MCG PO TABS: 88.0000 ug | ORAL_TABLET | Freq: Every day | ORAL | 1 refills | Status: DC

## 2023-05-11 ENCOUNTER — Ambulatory Visit: Payer: Commercial Managed Care - PPO | Admitting: Orthopaedic Surgery

## 2023-05-12 ENCOUNTER — Encounter: Payer: Self-pay | Admitting: Orthopaedic Surgery

## 2023-05-12 ENCOUNTER — Ambulatory Visit: Payer: Commercial Managed Care - PPO | Admitting: Orthopaedic Surgery

## 2023-05-12 VITALS — BP 106/70 | HR 81 | Ht 63.0 in | Wt 120.0 lb

## 2023-05-12 DIAGNOSIS — G8929 Other chronic pain: Secondary | ICD-10-CM | POA: Diagnosis not present

## 2023-05-12 DIAGNOSIS — M545 Low back pain, unspecified: Secondary | ICD-10-CM

## 2023-05-12 NOTE — Progress Notes (Signed)
My back hurts.  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  He is more and more an invalid.  He is weak and needs constant attention.  This is causing her more back pain.  She has no new trauma.  She has no weakness.  Lower back is tender, no spasm, ROM limited secondary to pain, NV intact, muscle tone and strength normal.  Encounter Diagnosis  Name Primary?   Chronic midline low back pain without sciatica Yes   I will refill her medicine next week.  Return in two months.  Call if any problem.  Precautions discussed.  Electronically Signed Darreld Mclean, MD 11/20/20242:30 PM

## 2023-05-17 ENCOUNTER — Ambulatory Visit: Payer: Commercial Managed Care - PPO

## 2023-05-18 ENCOUNTER — Telehealth: Payer: Self-pay | Admitting: Orthopaedic Surgery

## 2023-05-18 MED ORDER — HYDROCODONE-ACETAMINOPHEN 7.5-325 MG PO TABS: ORAL_TABLET | ORAL | 0 refills | Status: DC

## 2023-05-26 ENCOUNTER — Ambulatory Visit: Payer: Commercial Managed Care - PPO | Admitting: "Endocrinology

## 2023-06-09 ENCOUNTER — Telehealth: Payer: Self-pay | Admitting: Orthopaedic Surgery

## 2023-06-09 MED ORDER — HYDROCODONE-ACETAMINOPHEN 7.5-325 MG PO TABS: ORAL_TABLET | ORAL | 0 refills | Status: DC

## 2023-06-09 NOTE — Telephone Encounter (Signed)
Dr. Sanjuan Dame pt - spoke w/the patient, she is requesting a refill on Hydrocodone 7.5-325 to be sent to [facility].  She stated that it's due on 12/24, but with Christmas she wanted to go ahead and request it.

## 2023-06-14 ENCOUNTER — Other Ambulatory Visit: Payer: Self-pay

## 2023-06-14 DIAGNOSIS — E349 Endocrine disorder, unspecified: Secondary | ICD-10-CM

## 2023-06-14 DIAGNOSIS — E038 Other specified hypothyroidism: Secondary | ICD-10-CM

## 2023-06-14 MED ORDER — LEVOTHYROXINE SODIUM 88 MCG PO TABS: 88.0000 ug | ORAL_TABLET | Freq: Every day | ORAL | 0 refills | Status: DC

## 2023-07-12 ENCOUNTER — Telehealth: Payer: Self-pay | Admitting: Orthopaedic Surgery

## 2023-07-13 ENCOUNTER — Other Ambulatory Visit: Payer: Self-pay | Admitting: Orthopaedic Surgery

## 2023-07-13 MED ORDER — HYDROCODONE-ACETAMINOPHEN 7.5-325 MG PO TABS: ORAL_TABLET | ORAL | 0 refills | Status: DC

## 2023-07-13 NOTE — Telephone Encounter (Signed)
Dr. Sanjuan Dame pt - spoke w/the pt, she is requesting a refill on Hydrocodone 7.5-325 to be sent to [facility]

## 2023-07-14 ENCOUNTER — Ambulatory Visit: Payer: Commercial Managed Care - PPO | Admitting: Orthopaedic Surgery

## 2023-07-21 ENCOUNTER — Encounter: Payer: Self-pay | Admitting: Orthopaedic Surgery

## 2023-07-21 ENCOUNTER — Ambulatory Visit: Payer: Commercial Managed Care - PPO | Admitting: Orthopaedic Surgery

## 2023-07-21 VITALS — BP 118/78 | HR 80 | Ht 63.0 in | Wt 115.0 lb

## 2023-07-21 DIAGNOSIS — M545 Low back pain, unspecified: Secondary | ICD-10-CM

## 2023-07-21 DIAGNOSIS — G8929 Other chronic pain: Secondary | ICD-10-CM

## 2023-07-21 DIAGNOSIS — G894 Chronic pain syndrome: Secondary | ICD-10-CM

## 2023-07-21 NOTE — Progress Notes (Signed)
Her father died December 20.  She is depressed.  She was the caregiver.  She is dealing with the legal aspects of the estate.  Her lower back is tender.  She has not been that active.  She has no new trauma. She is not doing her exercises. She is taking her medicine.  Lower back is diffusely tender but has good motion, NV intact, muscle strength and tone normal.  Encounter Diagnoses  Name Primary?   Chronic midline low back pain without sciatica Yes   Chronic pain syndrome    Return in two months.  Call if any problem.  Precautions discussed.  Electronically Signed Darreld Mclean, MD 1/29/20252:25 PM

## 2023-07-29 LAB — TSH: TSH: 1.57 u[IU]/mL (ref 0.450–4.500)

## 2023-07-29 LAB — T4, FREE: Free T4: 1.37 ng/dL (ref 0.82–1.77)

## 2023-08-03 ENCOUNTER — Telehealth: Payer: Self-pay | Admitting: Orthopaedic Surgery

## 2023-08-04 ENCOUNTER — Ambulatory Visit: Payer: Commercial Managed Care - PPO | Admitting: "Endocrinology

## 2023-08-10 ENCOUNTER — Telehealth: Payer: Self-pay | Admitting: Orthopaedic Surgery

## 2023-08-10 NOTE — Telephone Encounter (Signed)
Dr. Sanjuan Dame pt - spoke w/the pt, she is requesting a refill on Hydrocodone 7.5-325 (she wants every 29 days) and Cyclobenzaprine 10mg  to be sent to [facility].

## 2023-08-11 ENCOUNTER — Ambulatory Visit: Payer: Commercial Managed Care - PPO | Admitting: "Endocrinology

## 2023-08-11 ENCOUNTER — Telehealth: Payer: Self-pay | Admitting: Orthopaedic Surgery

## 2023-08-11 MED ORDER — CYCLOBENZAPRINE HCL 10 MG PO TABS: ORAL_TABLET | ORAL | 0 refills | Status: DC

## 2023-08-11 MED ORDER — HYDROCODONE-ACETAMINOPHEN 7.5-325 MG PO TABS: ORAL_TABLET | ORAL | 0 refills | Status: DC

## 2023-08-23 ENCOUNTER — Ambulatory Visit: Payer: Commercial Managed Care - PPO | Admitting: "Endocrinology

## 2023-08-24 ENCOUNTER — Encounter: Payer: Self-pay | Admitting: "Endocrinology

## 2023-08-24 ENCOUNTER — Ambulatory Visit (INDEPENDENT_AMBULATORY_CARE_PROVIDER_SITE_OTHER): Admitting: "Endocrinology

## 2023-08-24 ENCOUNTER — Telehealth: Payer: Self-pay | Admitting: "Endocrinology

## 2023-08-24 VITALS — BP 92/70 | HR 84 | Ht 63.0 in | Wt 119.6 lb

## 2023-08-24 DIAGNOSIS — E782 Mixed hyperlipidemia: Secondary | ICD-10-CM | POA: Diagnosis not present

## 2023-08-24 DIAGNOSIS — E349 Endocrine disorder, unspecified: Secondary | ICD-10-CM | POA: Diagnosis not present

## 2023-08-24 DIAGNOSIS — E038 Other specified hypothyroidism: Secondary | ICD-10-CM

## 2023-08-24 MED ORDER — LEVOTHYROXINE SODIUM 88 MCG PO TABS: 88.0000 ug | ORAL_TABLET | Freq: Every day | ORAL | 1 refills | Status: DC

## 2023-08-24 NOTE — Telephone Encounter (Signed)
 Patient needs a ACTH before next visit in 6 months.

## 2023-08-24 NOTE — Progress Notes (Signed)
 08/24/2023        Endocrinology follow-up note   Subjective:    Patient ID: Vanessa Bowen, female    DOB: 1975/08/18, PCP Elfredia Nevins, MD   Past Medical History:  Diagnosis Date   Anxiety    Arthritis    Chronic back pain    Colitis, ulcerative (HCC)    Depression    History of kidney stones    2011   Hypothyroidism    Migraine    no longer has migraines   NAUSEA WITH VOMITING 09/23/2009   Pneumonia    Past Surgical History:  Procedure Laterality Date   ANKLE SURGERY     BREAST ENHANCEMENT SURGERY     CESAREAN SECTION     KNEE SURGERY Right    arthroscopy   LAPAROSCOPIC BILATERAL SALPINGECTOMY  07/27/2012   Procedure: LAPAROSCOPIC BILATERAL SALPINGECTOMY;  Surgeon: Lazaro Arms, MD;  Location: [facility];  Service: Gynecology;  Laterality: N/A;   OOPHORECTOMY Right 09/09/2016   Procedure: RIGHT OOPHORECTOMY;  Surgeon: Lazaro Arms, MD;  Location: [facility];  Service: Gynecology;  Laterality: Right;   OVARIAN CYST REMOVAL     TOOTH EXTRACTION N/A 01/30/2022   Procedure: DENTAL RESTORATION/EXTRACTIONS;  Surgeon: Ocie Doyne, DMD;  Location: [facility];  Service: Oral Surgery;  Laterality: N/A;   VAGINAL HYSTERECTOMY N/A 09/09/2016   Procedure: HYSTERECTOMY VAGINAL;  Surgeon: Lazaro Arms, MD;  Location: [facility];  Service: Gynecology;  Laterality: N/A;   Social History   Socioeconomic History   Marital status: Legally Separated    Spouse name: Not on file   Number of children: 2   Years of education: Not on file   Highest education level: Not on file  Occupational History   Not on file  Tobacco Use   Smoking status: Every Day    Current packs/day: 0.50    Average packs/day: 0.5 packs/day for 15.0 years (7.5 ttl pk-yrs)    Types: Cigarettes   Smokeless tobacco: Never  Vaping Use   Vaping status: Never Used  Substance and Sexual Activity   Alcohol use: Yes    Comment: rare   Drug use: No   Sexual activity: Not Currently    Birth control/protection: Surgical     Comment: hyst  Other Topics Concern   Not on file  Social History Narrative   Not on file   Social Drivers of Health   Financial Resource Strain: Not on file  Food Insecurity: Not on file  Transportation Needs: Not on file  Physical Activity: Not on file  Stress: Not on file  Social Connections: Unknown (12/30/2021)   Received from [facility]   Social Network    Social Network: Not on file   Outpatient Encounter Medications as of 08/24/2023  Medication Sig   cyclobenzaprine (FLEXERIL) 10 MG tablet TAKE (1) TABLET BY MOUTH AT BEDTIME AS NEEDED.   HYDROcodone-acetaminophen (NORCO) 7.5-325 MG tablet Take one tablet by mouth every six hours as needed for pain.  Must last 28 days.   ALPRAZolam (XANAX) 1 MG tablet Take 1mg  po bid prn for anxiety for 1 month, then take 1mg  daily as needed for anxiety for 1 month and then discontinue completely. (Patient taking differently: Take 1 mg by mouth 4 (four) times daily as needed for anxiety.)   clindamycin (CLEOCIN-T) 1 % external solution Apply topically 2 (two) times daily.   ibuprofen (ADVIL,MOTRIN) 200 MG tablet Take 400-600 mg by mouth every 8 (  eight) hours as needed (for pain/headaches.).   levothyroxine (SYNTHROID) 88 MCG tablet Take 1 tablet (88 mcg total) by mouth daily before breakfast.   MOVANTIK 25 MG TABS tablet Take 25 mg by mouth daily.   rosuvastatin (CRESTOR) 10 MG tablet Take 10 mg by mouth daily.   topiramate (TOPAMAX) 200 MG tablet Take 200 mg by mouth daily.   traZODone (DESYREL) 150 MG tablet Take 300 mg by mouth at bedtime.   [DISCONTINUED] dicyclomine (BENTYL) 20 MG tablet Take 1 tablet (20 mg total) by mouth 3 (three) times daily as needed for up to 30 days for spasms.   [DISCONTINUED] levothyroxine (SYNTHROID) 88 MCG tablet Take 1 tablet (88 mcg total) by mouth daily before breakfast.   No facility-administered encounter medications on file as of 08/24/2023.   ALLERGIES: Allergies  Allergen Reactions    Doxycycline Nausea And Vomiting   Sulfonamide Derivatives Nausea And Vomiting   VACCINATION STATUS:  There is no immunization history on file for this patient.  She is here to follow-up for hypothyroidism, hyperlipidemia, and previously documented hypocortisolemia.  She is returning with repeat thyroid function test consistent with slight under replacement.  She is currently on levothyroxine 88 mcg p.o. daily before breakfast.  She is starting through social stressors including death of her father and going through divorce. She presents with 3 pounds of weight loss. She still does not eat enough due to poor dentition. She has hypothyroidism diagnosed at approximate age of 29 years.  She has hyperlipidemia, on Crestor 10 mg p.o. nightly.  She did not have a chance to complete the ACTH stimulation test.  She was previously documented to have hypocortisolemia.    She is not on blood pressure medications.  Her other medications include alprazolam, topiramate, trazodone, tizanidine, Norco.   Review of systems   Objective:    BP 92/70   Pulse 84   Ht 5\' 3"  (1.6 m)   Wt 119 lb 9.6 oz (54.3 kg)   LMP 07/24/2016   BMI 21.19 kg/m   Wt Readings from Last 3 Encounters:  08/24/23 119 lb 9.6 oz (54.3 kg)  07/21/23 115 lb (52.2 kg)  05/12/23 120 lb (54.4 kg)    BP Readings from Last 3 Encounters:  08/24/23 92/70  07/21/23 118/78  05/12/23 106/70    Physical Exam- Limited  Constitutional:  Body mass index is 21.19 kg/m. , not in acute distress, + dysphoric mood.     CMP     Component Value Date/Time   NA 139 01/20/2023 1100   K 4.2 01/20/2023 1100   CL 102 01/20/2023 1100   CO2 23 01/20/2023 1100   GLUCOSE 80 01/20/2023 1100   GLUCOSE 123 (H) 10/09/2019 1442   BUN 10 01/20/2023 1100   CREATININE 1.03 (H) 01/20/2023 1100   CREATININE 1.05 04/27/2017 1226   CALCIUM 9.6 01/20/2023 1100   PROT 6.8 01/20/2023 1100   ALBUMIN 4.6 01/20/2023 1100   AST 29 01/20/2023 1100   ALT 43  (H) 01/20/2023 1100   ALKPHOS 65 01/20/2023 1100   BILITOT 0.2 01/20/2023 1100   GFRNONAA >60 10/09/2019 1442   GFRAA >60 10/09/2019 1442   Recent Results (from the past 2160 hours)  TSH     Status: None   Collection Time: 07/28/23 11:00 AM  Result Value Ref Range   TSH 1.570 0.450 - 4.500 uIU/mL  T4, free     Status: None   Collection Time: 07/28/23 11:00 AM  Result Value Ref Range  Free T4 1.37 0.82 - 1.77 ng/dL    Assessment & Plan:   1. Hypothyroidism   2.  Abnormal cortisol level  3.  Hyperlipidemia  -Her previsit thyroid function tests are consistent with appropriate replacement.  She is advised to continue levothyroxine 88 mcg p.o. daily before breakfast.     - We discussed about the correct intake of her thyroid hormone, on empty stomach at fasting, with water, separated by at least 30 minutes from breakfast and other medications,  and separated by more than 4 hours from calcium, iron, multivitamins, acid reflux medications (PPIs). -Patient is made aware of the fact that thyroid hormone replacement is needed for life, dose to be adjusted by periodic monitoring of thyroid function tests.   Regarding her discordant cortisol level, not a diagnosis of adrenal insufficiency at this time.  Her previsit 24-hour urine free cortisol measurement revealed low normal cortisol level at 13. -She has a few risk for adrenal insufficiency including opioids.  She will be sent for ACTH stimulation test again before her next visit to complete the assessment of her adrenal reserve.  Regarding her dyslipidemia , she has responded to low-dose Crestor.  She is advised to continue.   -She has so much social pressure.  She reports that she was family numbers to share with.  She is advised to seek counseling, communicate with family members.  - I advised patient to maintain close follow up with Elfredia Nevins, MD for primary care needs.   I spent  22  minutes in the care of the patient today  including review of labs from Thyroid Function, CMP, and other relevant labs ; imaging/biopsy records (current and previous including abstractions from other facilities); face-to-face time discussing  her lab results and symptoms, medications doses, her options of short and long term treatment based on the latest standards of care / guidelines;   and documenting the encounter.  Beverly Milch  participated in the discussions, expressed understanding, and voiced agreement with the above plans.  All questions were answered to her satisfaction. she is encouraged to contact clinic should she have any questions or concerns prior to her return visit.   Follow up plan: Return in about 6 months (around 02/24/2024) for Fasting Labs  in AM B4 8.  Ronny Bacon, [facility] [address] [place], [place] [address] Phone: [phone] Fax: [phone]  08/24/2023, 9:47 AM

## 2023-08-24 NOTE — Telephone Encounter (Signed)
 [facility]

## 2023-08-25 ENCOUNTER — Other Ambulatory Visit: Payer: Self-pay

## 2023-09-08 ENCOUNTER — Telehealth: Payer: Self-pay | Admitting: Orthopaedic Surgery

## 2023-09-08 MED ORDER — HYDROCODONE-ACETAMINOPHEN 7.5-325 MG PO TABS: ORAL_TABLET | ORAL | 0 refills | Status: DC

## 2023-10-06 ENCOUNTER — Emergency Department (HOSPITAL_COMMUNITY)
Admission: EM | Admit: 2023-10-06 | Discharge: 2023-10-06 | Disposition: A | Discharge status: Home / Self Care | Attending: Emergency Medicine | Admitting: Emergency Medicine

## 2023-10-06 ENCOUNTER — Other Ambulatory Visit: Payer: Self-pay

## 2023-10-06 ENCOUNTER — Emergency Department (HOSPITAL_COMMUNITY)

## 2023-10-06 ENCOUNTER — Encounter (HOSPITAL_COMMUNITY): Payer: Self-pay

## 2023-10-06 ENCOUNTER — Encounter: Payer: Self-pay | Admitting: "Endocrinology

## 2023-10-06 DIAGNOSIS — R55 Syncope and collapse: Secondary | ICD-10-CM | POA: Insufficient documentation

## 2023-10-06 DIAGNOSIS — R569 Unspecified convulsions: Secondary | ICD-10-CM | POA: Diagnosis present

## 2023-10-06 DIAGNOSIS — R519 Headache, unspecified: Secondary | ICD-10-CM | POA: Insufficient documentation

## 2023-10-06 LAB — CBC WITH DIFFERENTIAL/PLATELET
Abs Immature Granulocytes: 0.03 10*3/uL (ref 0.00–0.07)
Basophils Absolute: 0.1 10*3/uL (ref 0.0–0.1)
Basophils Relative: 1 %
Eosinophils Absolute: 0.1 10*3/uL (ref 0.0–0.5)
Eosinophils Relative: 1 %
HCT: 38.3 % (ref 36.0–46.0)
Hemoglobin: 12.8 g/dL (ref 12.0–15.0)
Immature Granulocytes: 0 %
Lymphocytes Relative: 36 %
Lymphs Abs: 4.2 10*3/uL — ABNORMAL HIGH (ref 0.7–4.0)
MCH: 31.2 pg (ref 26.0–34.0)
MCHC: 33.4 g/dL (ref 30.0–36.0)
MCV: 93.4 fL (ref 80.0–100.0)
Monocytes Absolute: 0.6 10*3/uL (ref 0.1–1.0)
Monocytes Relative: 5 %
Neutro Abs: 6.6 10*3/uL (ref 1.7–7.7)
Neutrophils Relative %: 57 %
Platelets: 353 10*3/uL (ref 150–400)
RBC: 4.1 MIL/uL (ref 3.87–5.11)
RDW: 13 % (ref 11.5–15.5)
WBC: 11.6 10*3/uL — ABNORMAL HIGH (ref 4.0–10.5)
nRBC: 0 % (ref 0.0–0.2)

## 2023-10-06 LAB — COMPREHENSIVE METABOLIC PANEL WITH GFR
ALT: 24 U/L (ref 0–44)
AST: 21 U/L (ref 15–41)
Albumin: 4.1 g/dL (ref 3.5–5.0)
Alkaline Phosphatase: 51 U/L (ref 38–126)
Anion gap: 15 (ref 5–15)
BUN: 8 mg/dL (ref 6–20)
CO2: 20 mmol/L — ABNORMAL LOW (ref 22–32)
Calcium: 9.5 mg/dL (ref 8.9–10.3)
Chloride: 105 mmol/L (ref 98–111)
Creatinine, Ser: 0.83 mg/dL (ref 0.44–1.00)
GFR, Estimated: >60 mL/min (ref >60)
Glucose, Bld: 101 mg/dL — ABNORMAL HIGH (ref 70–99)
Potassium: 3.7 mmol/L (ref 3.5–5.1)
Sodium: 140 mmol/L (ref 135–145)
Total Bilirubin: 0.8 mg/dL (ref 0.0–1.2)
Total Protein: 6.9 g/dL (ref 6.5–8.1)

## 2023-10-06 LAB — RAPID URINE DRUG SCREEN, HOSP PERFORMED
Amphetamines: NOT DETECTED
Barbiturates: NOT DETECTED
Benzodiazepines: POSITIVE — AB
Cocaine: NOT DETECTED
Opiates: POSITIVE — AB
Tetrahydrocannabinol: NOT DETECTED

## 2023-10-06 LAB — D-DIMER, QUANTITATIVE: D-Dimer, Quant: <0.27 ug{FEU}/mL (ref 0.00–0.50)

## 2023-10-06 LAB — ETHANOL: Alcohol, Ethyl (B): <10 mg/dL (ref <10)

## 2023-10-06 LAB — MAGNESIUM: Magnesium: 2.2 mg/dL (ref 1.7–2.4)

## 2023-10-06 LAB — CBG MONITORING, ED: Glucose-Capillary: 98 mg/dL (ref 70–99)

## 2023-10-06 LAB — TROPONIN I (HIGH SENSITIVITY): Troponin I (High Sensitivity): 4 ng/L (ref <18)

## 2023-10-06 MED ORDER — METOCLOPRAMIDE HCL 5 MG/ML IJ SOLN
10.0000 mg | Freq: Once | INTRAMUSCULAR | Status: AC
  Administered 2023-10-06: 10 mg via INTRAVENOUS
  Filled 2023-10-06: qty 2

## 2023-10-06 MED ORDER — DIPHENHYDRAMINE HCL 50 MG/ML IJ SOLN
25.0000 mg | Freq: Once | INTRAMUSCULAR | Status: AC
  Administered 2023-10-06: 25 mg via INTRAVENOUS
  Filled 2023-10-06: qty 1

## 2023-10-06 MED ORDER — LACTATED RINGERS IV BOLUS
1000.0000 mL | Freq: Once | INTRAVENOUS | Status: AC
  Administered 2023-10-06: 1000 mL via INTRAVENOUS

## 2023-10-06 MED ORDER — LORAZEPAM 1 MG PO TABS
1.0000 mg | ORAL_TABLET | Freq: Once | ORAL | Status: AC
  Administered 2023-10-06: 1 mg via ORAL
  Filled 2023-10-06: qty 1

## 2023-10-06 MED ORDER — KETOROLAC TROMETHAMINE 15 MG/ML IJ SOLN
15.0000 mg | Freq: Once | INTRAMUSCULAR | Status: AC
  Administered 2023-10-06: 15 mg via INTRAVENOUS
  Filled 2023-10-06: qty 1

## 2023-10-06 NOTE — ED Notes (Signed)
 Pt in CT at this time.

## 2023-10-06 NOTE — Discharge Instructions (Signed)
 If you develop continued, recurrent, or worsening headache, fever, neck stiffness, vomiting, blurry or double vision, weakness or numbness in your arms or legs, trouble speaking, or any other new/concerning symptoms then return to the ER for evaluation.   - It's unclear if you had a seizure tonight. According to [place] law, you can not drive unless you are seizure / syncope free for at least 6 months and under physician's care.    - Please maintain precautions. Do not participate in activities where a loss of awareness could harm you or someone else. No swimming alone, no tub bathing, no hot tubs, no driving, no operating motorized vehicles (cars, ATVs, motocycles, etc), lawnmowers, power tools or firearms. No standing at heights, such as rooftops, ladders or stairs. Avoid hot objects such as stoves, heaters, open fires. Wear a helmet when riding a bicycle, scooter, skateboard, etc. and avoid areas of traffic. Set your water heater to 120 degrees or less.

## 2023-10-06 NOTE — ED Triage Notes (Signed)
 Pt reports:  Syncope Episode Happened today Recently stopped buspur On Sunday  New med Taking for 1 month Seizure Reported by sister who is a nurse Happened within last hour Headache Has migraines at baseline Headache feels different Nausea Took phenergan with no relief

## 2023-10-06 NOTE — ED Provider Notes (Signed)
 [place] EMERGENCY DEPARTMENT AT [facility] Provider Note   CSN: [id number] Arrival date & time: 10/06/23  1840     History  Chief Complaint  Patient presents with   Loss of Consciousness   Headache   Seizures    Vanessa Bowen is a 48 y.o. female.  HPI 48 year old female presents with headache and syncope versus seizure.  Patient states she has been having a headache for a few days.  She gets headaches but she also gets migraines.  This feels like one of her more generalized headaches but is never lasted this long.  Headache is rated as about a 6 out of 10.  She is having some photophobia.  It has been gradually worsening since onset.  No thunderclap component.  She also recently stopped BuSpar which she was put on about a month ago to help with anxiety but she seemed to be having some adverse effects due to this so she stopped a few days ago.  She was having her head in her hands and sitting in the chair when she went to stand up but apparently passed out.  Her sister think she had some seizure-like activity (this is per her report as she is not in the room).  Patient states she did not have any acute worsening of her headache before or after the episode.  She has been having some chest tightness that she thinks is from anxiety over the last few days.  She denies any focal weakness or numbness or neck stiffness.  No fevers. Headache is generalized.   Home Medications Prior to Admission medications   Medication Sig Start Date End Date Taking? Authorizing Provider  alprazolam Prudy Feeler) 2 MG tablet Take 2 mg by mouth in the morning, at noon, in the evening, and at bedtime. 09/13/23  Yes [provider]  cyclobenzaprine (FLEXERIL) 10 MG tablet TAKE (1) TABLET BY MOUTH AT BEDTIME AS NEEDED. Patient taking differently: Take 10 mg by mouth at bedtime. 08/11/23  Yes Darreld Mclean, MD  HYDROcodone-acetaminophen [facility]) 7.5-325 MG tablet Take one tablet by mouth every six hours  as needed for pain.  Must last 28 days. 09/08/23  Yes Darreld Mclean, MD  ibuprofen (ADVIL,MOTRIN) 200 MG tablet Take 400-600 mg by mouth every 8 (eight) hours as needed (for pain/headaches.).   Yes [provider]  levothyroxine (SYNTHROID) 88 MCG tablet Take 1 tablet (88 mcg total) by mouth daily before breakfast. 08/24/23  Yes Nida, Denman George, MD  promethazine (PHENERGAN) 25 MG tablet Take 25 mg by mouth 4 (four) times daily as needed for nausea or vomiting. 09/30/23  Yes [provider]  rosuvastatin (CRESTOR) 10 MG tablet Take 10 mg by mouth daily. 03/30/23  Yes [provider]  tiZANidine (ZANAFLEX) 4 MG tablet Take 4 mg by mouth every 6 (six) hours as needed for muscle spasms. 09/30/23  Yes [provider]  topiramate (TOPAMAX) 200 MG tablet Take 200 mg by mouth daily. 01/14/23  Yes [provider]  traZODone (DESYREL) 100 MG tablet Take 300 mg by mouth at bedtime. 09/27/23  Yes [provider]  dicyclomine (BENTYL) 20 MG tablet Take 1 tablet (20 mg total) by mouth 3 (three) times daily as needed for up to 30 days for spasms. 08/23/18 03/06/19  Pucilowski, Roosvelt Maser, MD      Allergies    Doxycycline and Sulfonamide derivatives    Review of Systems   Review of Systems  Constitutional:  Negative for fever.  Eyes:  Positive for photophobia and visual disturbance (blurry).  Respiratory:  Positive for chest tightness.   Gastrointestinal:  Positive for nausea. Negative for abdominal pain.  Musculoskeletal:  Negative for neck pain and neck stiffness.  Neurological:  Positive for syncope and headaches. Negative for weakness and numbness.    Physical Exam Updated Vital Signs BP (!) 102/92   Pulse 92   Temp 98.8 F (37.1 C)   Resp 14   Ht 5\' 3"  (1.6 m)   Wt 52.6 kg   LMP 07/24/2016   SpO2 100%   BMI 20.55 kg/m  Physical Exam Vitals and nursing note reviewed.  Constitutional:      General: She is not in acute distress.     Appearance: She is well-developed. She is not ill-appearing or diaphoretic.  HENT:     Head: Normocephalic and atraumatic.  Eyes:     Extraocular Movements: Extraocular movements intact.     Pupils: Pupils are equal, round, and reactive to light.     Comments: +photophobia  Cardiovascular:     Rate and Rhythm: Normal rate and regular rhythm.     Heart sounds: Normal heart sounds.  Pulmonary:     Effort: Pulmonary effort is normal.     Breath sounds: Normal breath sounds.  Abdominal:     Palpations: Abdomen is soft.     Tenderness: There is no abdominal tenderness.  Musculoskeletal:     Cervical back: Normal range of motion. No rigidity.  Skin:    General: Skin is warm and dry.  Neurological:     Mental Status: She is alert.     Comments: CN 3-12 grossly intact. 5/5 strength in all 4 extremities. Grossly normal sensation. Normal finger to nose.      ED Results / Procedures / Treatments   Labs (all labs ordered are listed, but only abnormal results are displayed) Labs Reviewed  COMPREHENSIVE METABOLIC PANEL WITH GFR - Abnormal; Notable for the following components:      Result Value   CO2 20 (*)    Glucose, Bld 101 (*)    All other components within normal limits  CBC WITH DIFFERENTIAL/PLATELET - Abnormal; Notable for the following components:   WBC 11.6 (*)    Lymphs Abs 4.2 (*)    All other components within normal limits  RAPID URINE DRUG SCREEN, HOSP PERFORMED - Abnormal; Notable for the following components:   Opiates POSITIVE (*)    Benzodiazepines POSITIVE (*)    All other components within normal limits  MAGNESIUM  ETHANOL  D-DIMER, QUANTITATIVE  CBG MONITORING, ED  TROPONIN I (HIGH SENSITIVITY)    EKG EKG Interpretation Date/Time:  Wednesday October 06 2023 19:04:21 EDT Ventricular Rate:  91 PR Interval:  122 QRS Duration:  82 QT Interval:  334 QTC Calculation: 410 R Axis:   84  Text Interpretation: Normal sinus rhythm Cannot rule out Inferior infarct  , age undetermined Abnormal ECG  overall similar to 2021 Confirmed by Jerilynn Montenegro [phone]) on 10/06/2023 7:36:13 PM  Radiology DG Chest Port 1 View Result Date: 10/06/2023 CLINICAL DATA:  106001 Syncope 106001 EXAM: PORTABLE CHEST 1 VIEW COMPARISON:  Chest x-ray 08/14/2019 FINDINGS: The heart and mediastinal contours are within normal limits. Biapical pleural/pulmonary scarring. No focal consolidation. No pulmonary edema. No pleural effusion. No pneumothorax. No acute osseous abnormality. IMPRESSION: No active disease. Electronically Signed   By: Morgane  Naveau M.D.   On: 10/06/2023 20:45   CT HEAD WO CONTRAST Result Date: 10/06/2023 CLINICAL DATA:  Seizure, new-onset, no history of trauma EXAM: CT HEAD WITHOUT CONTRAST TECHNIQUE: Contiguous axial images were obtained from the base of the skull through the vertex without intravenous contrast. RADIATION DOSE REDUCTION: This exam was performed according to the departmental dose-optimization program which includes automated exposure control, adjustment of the mA and/or kV according to patient size and/or use of iterative reconstruction technique. COMPARISON:  CT head 02/01/2017 FINDINGS: Brain: No evidence of large-territorial acute infarction. No parenchymal hemorrhage. No mass lesion. No extra-axial collection. No mass effect or midline shift. No hydrocephalus. Basilar cisterns are patent. Vascular: No hyperdense vessel. Skull: No acute fracture or focal lesion. Sinuses/Orbits: Paranasal sinuses and mastoid air cells are clear. The orbits are unremarkable. Other: None. IMPRESSION: No acute intracranial abnormality. Electronically Signed   By: Morgane  Naveau M.D.   On: 10/06/2023 20:35    Procedures Procedures    Medications Ordered in ED Medications  lactated ringers bolus 1,000 mL (0 mLs Intravenous Stopped 10/06/23 2138)  metoCLOPramide (REGLAN) injection 10 mg (10 mg Intravenous Given 10/06/23 2039)  diphenhydrAMINE (BENADRYL) injection 25 mg  (25 mg Intravenous Given 10/06/23 2039)  ketorolac (TORADOL) 15 MG/ML injection 15 mg (15 mg Intravenous Given 10/06/23 2048)  LORazepam (ATIVAN) tablet 1 mg (1 mg Oral Given 10/06/23 2155)    ED Course/ Medical Decision Making/ A&P                                 Medical Decision Making Amount and/or Complexity of Data Reviewed Labs: ordered.    Details: Normal hemoglobin.  Normal troponin. Radiology: ordered and independent interpretation performed.    Details: No head bleed ECG/medicine tests: ordered and independent interpretation performed.    Details: No ischemia or change from baseline  Risk Prescription drug management.   Patient presents with syncope versus seizure.  Unclear if she had syncope versus some brief seizure-like activity from the syncope itself.  No thunderclap headache but is having a worsening headache over several days.  She was given some medicine here which did help with her headache though she feels little restless so she was given some Ativan.  Chest pain/tightness has been for several days and she has an unremarkable ECG and troponin.  Doubt PE and combined with a negative D-dimer I do not think further workup is needed.  She feels well enough for discharge.  My suspicion of subarachnoid hemorrhage, meningitis, stroke, or other acute CNS emergency is quite low.  Highly doubt ACS, PE, dissection.  Will give neurology referral given questionable seizure-like activity and follow-up with PCP.  Given return precautions.        Final Clinical Impression(s) / ED Diagnoses Final diagnoses:  Seizure (HCC)    Rx / DC Orders ED Discharge Orders          Ordered    Ambulatory referral to Neurology       Comments: An appointment is requested in approximately: 1 week   10/06/23 2302              Jerilynn Montenegro, MD 10/06/23 [phone]

## 2023-10-06 NOTE — ED Notes (Signed)
 ED Provider at bedside.

## 2023-10-20 ENCOUNTER — Ambulatory Visit: Payer: Commercial Managed Care - PPO | Admitting: Orthopaedic Surgery

## 2023-10-27 ENCOUNTER — Ambulatory Visit: Admitting: Orthopaedic Surgery

## 2023-10-27 ENCOUNTER — Encounter: Payer: Self-pay | Admitting: Orthopaedic Surgery

## 2023-10-27 VITALS — BP 116/91 | HR 93 | Ht 63.0 in | Wt 120.0 lb

## 2023-10-27 DIAGNOSIS — M545 Low back pain, unspecified: Secondary | ICD-10-CM | POA: Diagnosis not present

## 2023-10-27 DIAGNOSIS — G894 Chronic pain syndrome: Secondary | ICD-10-CM | POA: Diagnosis not present

## 2023-10-27 MED ORDER — CYCLOBENZAPRINE HCL 10 MG PO TABS: ORAL_TABLET | ORAL | 0 refills | Status: DC

## 2023-10-27 NOTE — Progress Notes (Signed)
 My back hurts.  She has chronic lower back pain.  She has good and bad days, more bad days recently.  She is active. She has new job [employer]. She has no new trauma, no weakness.  ROM of the back is decreased secondary to pain, NV intact, muscle tone and strength normal, gait is good, no spasm.  Encounter Diagnoses  Name Primary?   Chronic midline low back pain without sciatica Yes   Chronic pain syndrome    I have refilled her Flexeril .  Continue her exercises.  Return in three months.  Call if any problem.  Precautions discussed.  Electronically Signed Pleasant Brilliant, MD 5/7/202510:14 AM

## 2023-11-03 ENCOUNTER — Telehealth: Payer: Self-pay | Admitting: Orthopaedic Surgery

## 2023-11-03 ENCOUNTER — Other Ambulatory Visit: Payer: Self-pay | Admitting: Orthopaedic Surgery

## 2023-12-01 ENCOUNTER — Telehealth: Payer: Self-pay | Admitting: Orthopaedic Surgery

## 2023-12-01 NOTE — Telephone Encounter (Signed)
 Dr. Vicente Graham pt - spoke w/the pt, she is requesting a refill for Hydrocodone  7.5-325 and Cyclobenzaprine  10mg  to be sent to [facility].

## 2023-12-22 ENCOUNTER — Ambulatory Visit: Admitting: Orthopaedic Surgery

## 2023-12-22 ENCOUNTER — Encounter: Payer: Self-pay | Admitting: Orthopaedic Surgery

## 2023-12-22 VITALS — BP 110/74 | HR 94 | Ht 63.0 in | Wt 117.0 lb

## 2023-12-22 DIAGNOSIS — M545 Low back pain, unspecified: Secondary | ICD-10-CM | POA: Diagnosis not present

## 2023-12-22 DIAGNOSIS — G894 Chronic pain syndrome: Secondary | ICD-10-CM | POA: Diagnosis not present

## 2023-12-22 DIAGNOSIS — G8929 Other chronic pain: Secondary | ICD-10-CM

## 2023-12-22 MED ORDER — PREDNISONE 5 MG (21) PO TBPK: ORAL_TABLET | ORAL | 0 refills | Status: DC

## 2023-12-22 NOTE — Progress Notes (Signed)
 My pain is worse.  She has a new job as Production assistant, radio at [employer].  She is working 7 1/2 hour shifts.   She has more pain in the back.  She wants relief.  She gets 120 hydrocodone  7.5 a month.  She is not due until next week.  She has been to pain clinic in the past.  We had a good talk.  I cannot give more narcotic.  I will give prednisone  dose pack.  Return in regular appointment.  Encounter Diagnoses  Name Primary?   Chronic midline low back pain without sciatica Yes   Chronic pain syndrome    Call if any problem.  Precautions discussed.  Electronically Signed Lemond Stable, MD 7/2/202510:02 AM

## 2023-12-28 ENCOUNTER — Ambulatory Visit: Admitting: Adult Health

## 2023-12-28 ENCOUNTER — Encounter: Payer: Self-pay | Admitting: Adult Health

## 2023-12-28 VITALS — BP 109/62 | HR 79 | Ht 63.0 in | Wt 117.5 lb

## 2023-12-28 DIAGNOSIS — Z90710 Acquired absence of both cervix and uterus: Secondary | ICD-10-CM | POA: Diagnosis not present

## 2023-12-28 DIAGNOSIS — N941 Unspecified dyspareunia: Secondary | ICD-10-CM

## 2023-12-28 DIAGNOSIS — N362 Urethral caruncle: Secondary | ICD-10-CM | POA: Diagnosis not present

## 2023-12-28 DIAGNOSIS — R6882 Decreased libido: Secondary | ICD-10-CM | POA: Diagnosis not present

## 2023-12-28 DIAGNOSIS — N952 Postmenopausal atrophic vaginitis: Secondary | ICD-10-CM | POA: Diagnosis not present

## 2023-12-28 MED ORDER — INTRAROSA 6.5 MG VA INST: 1.0000 | VAGINAL_INSERT | Freq: Every day | VAGINAL | 1 refills | Status: DC

## 2023-12-28 NOTE — Progress Notes (Signed)
  Subjective:     Patient ID: Vanessa Bowen, female   DOB: [birth year], 48 y.o.   MRN: [id number]  HPI Vanessa Bowen is a 48 year old white female,separated, sp hysterectomy in complaining of no sex drive and sex is uncomfortable. Years ago took BCP and testosterone injections, it that worked.   PCP is Dr Bertell Review of Systems No sex drive Sex is uncomfortable Reviewed past medical,surgical, social and family history. Reviewed medications and allergies.     Objective:   Physical Exam BP 109/62 (BP Location: Left Arm, Patient Position: Sitting, Cuff Size: Normal)   Pulse 79   Ht 5' 3 (1.6 m)   Wt 117 lb 8 oz (53.3 kg)   LMP 07/24/2016   BMI 20.81 kg/m     Skin warm and dry.Pelvic: external genitalia is normal in appearance, has epidermal cyst right labia, vagina is pale and atrophic, clitoris is hooded,urethra has small caruncle, cervix and uterus are absent, adnexa: no masses or tenderness noted. Bladder is non tender and no masses felt.  Fall risk is low  Upstream - 12/28/23 0855       Pregnancy Intention Screening   Does the patient want to become pregnant in the next year? N/A    Does the patient's partner want to become pregnant in the next year? N/A    Would the patient like to discuss contraceptive options today? N/A      Contraception Wrap Up   Current Method Female Sterilization   hyst   End Method Female Sterilization   hyst   Contraception Counseling Provided No         Examination chaperoned by Clarita Salt LPN Assessment:     1. Vaginal atrophy (Primary) Vagina is pale and atrophic, will try intrarosa  at bedtime, 28 inserts given and rx sent  Meds ordered this encounter  Medications   Prasterone  (INTRAROSA ) 6.5 MG INST    Sig: Place 1 applicator vaginally daily. At bedtime    Dispense:  28 each    Refill:  1    Supervising Provider:   JAYNE MINDER H [[id number]]     2. S/P vaginal hysterectomy  3. Decreased sex drive No drive and can't reach orgasm  Discussed  some could be medication related too Try self stimulation, with good lubricate in about 3 weeks   4. Urethral caruncle  5. Dyspareunia in female Sex is uncomfortable     Plan:     Follow up in 4 weeks for exam and ROS

## 2023-12-29 ENCOUNTER — Telehealth: Payer: Self-pay

## 2023-12-30 ENCOUNTER — Telehealth: Payer: Self-pay | Admitting: Orthopaedic Surgery

## 2023-12-30 MED ORDER — HYDROCODONE-ACETAMINOPHEN 7.5-325 MG PO TABS: ORAL_TABLET | ORAL | 0 refills | Status: DC

## 2023-12-30 NOTE — Telephone Encounter (Signed)
 Med refill

## 2023-12-30 NOTE — Telephone Encounter (Signed)
 Dr. Janae pt - spoke w/the pt, she stated that the pharmacy told her to call bc they sent a fax request yesterday for a refill on Hydrocodone  7.5-325, but didn't get a response.  She uses [facility] .

## 2023-12-30 NOTE — Telephone Encounter (Signed)
 Medication already filled.

## 2024-01-25 ENCOUNTER — Ambulatory Visit: Admitting: Adult Health

## 2024-01-26 ENCOUNTER — Encounter: Payer: Self-pay | Admitting: Orthopaedic Surgery

## 2024-01-26 ENCOUNTER — Ambulatory Visit: Admitting: Orthopaedic Surgery

## 2024-01-26 VITALS — BP 109/78 | HR 88 | Ht 63.0 in | Wt 117.0 lb

## 2024-01-26 DIAGNOSIS — M545 Low back pain, unspecified: Secondary | ICD-10-CM | POA: Diagnosis not present

## 2024-01-26 DIAGNOSIS — G894 Chronic pain syndrome: Secondary | ICD-10-CM | POA: Diagnosis not present

## 2024-01-26 MED ORDER — HYDROCODONE-ACETAMINOPHEN 7.5-325 MG PO TABS: ORAL_TABLET | ORAL | 0 refills | Status: DC

## 2024-01-26 NOTE — Progress Notes (Signed)
 My back is sore.  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  Her back pain is less but she has pain with increased activity and bending.  She has no new trauma.  She is taking her medicine and doing her exercises.  Lumbar pain is present but ROM is good, NV intact, muscle tone and strength are normal, gait is normal today.  Encounter Diagnoses  Name Primary?   Chronic midline low back pain without sciatica Yes   Chronic pain syndrome    I have reviewed the [place]  Controlled Substance Reporting System web site prior to prescribing narcotic medicine for this patient.  Return in three months.  Call if any problem.  Precautions discussed.  Electronically Signed Lemond Stable, MD 8/6/20259:23 AM

## 2024-01-26 NOTE — Patient Instructions (Signed)
 Return in 3 months with [name] unless needed before then give us  a call.

## 2024-01-27 ENCOUNTER — Encounter: Payer: Self-pay | Admitting: Adult Health

## 2024-01-27 ENCOUNTER — Ambulatory Visit: Admitting: Adult Health

## 2024-01-27 VITALS — BP 105/70 | HR 88 | Ht 63.0 in | Wt 116.5 lb

## 2024-01-27 DIAGNOSIS — N952 Postmenopausal atrophic vaginitis: Secondary | ICD-10-CM | POA: Diagnosis not present

## 2024-01-27 DIAGNOSIS — Z90710 Acquired absence of both cervix and uterus: Secondary | ICD-10-CM | POA: Diagnosis not present

## 2024-01-27 DIAGNOSIS — R6882 Decreased libido: Secondary | ICD-10-CM

## 2024-01-27 DIAGNOSIS — N362 Urethral caruncle: Secondary | ICD-10-CM

## 2024-01-27 DIAGNOSIS — L732 Hidradenitis suppurativa: Secondary | ICD-10-CM

## 2024-01-27 MED ORDER — INTRAROSA 6.5 MG VA INST: 1.0000 | VAGINAL_INSERT | Freq: Every day | VAGINAL | 6 refills | Status: AC

## 2024-01-27 MED ORDER — CLINDAMYCIN PHOSPHATE 1 % EX SOLN
Freq: Two times a day (BID) | CUTANEOUS | 6 refills | Status: AC
Start: 2024-01-27 — End: ?

## 2024-01-27 NOTE — Progress Notes (Signed)
  Subjective:     Patient ID: Vanessa Bowen, female   DOB: Oct 11, 1975, 48 y.o.   MRN: [id number]  HPI Mackenze is a 48 year old white female, separated, sp hysterectomy back in follow up in starting intrarosa  in July and feels better,more moist, has more feeling with clitoral stimulation.  PCP is Dr Bertell.  Review of Systems Feels better, more moist Has more feeling with clitoral stimulation Has not had sex Reviewed past medical,surgical, social and family history. Reviewed medications and allergies.     Objective:   Physical Exam BP 105/70 (BP Location: Left Arm, Patient Position: Sitting, Cuff Size: Normal)   Pulse 88   Ht 5' 3 (1.6 m)   Wt 116 lb 8 oz (52.8 kg)   LMP 07/24/2016   BMI 20.64 kg/m     Skin warm and dry.Pelvic: external genitalia is normal in appearance, has sebaceous cyst right vulva, has HS scarring both inner thighs and groin, vagina: is pinker and more moist,urethra has caruncle, cervix and uterus are absent, adnexa: no masses or tenderness noted. Bladder is non tender and no masses felt.   Upstream - 01/27/24 1051       Pregnancy Intention Screening   Does the patient want to become pregnant in the next year? N/A    Does the patient's partner want to become pregnant in the next year? N/A    Would the patient like to discuss contraceptive options today? N/A      Contraception Wrap Up   Current Method Female Sterilization   hyst   End Method Female Sterilization   hyst   Contraception Counseling Provided No         Examination chaperoned by Clarita Salt LPN  Assessment:     1. Vaginal atrophy (Primary) Much better with intrarosa , wil refill  Meds ordered this encounter  Medications   Prasterone  (INTRAROSA ) 6.5 MG INST    Sig: Place 1 applicator vaginally daily. At bedtime    Dispense:  28 each    Refill:  6    Supervising Provider:   JAYNE VONN DEL [[id number]]   clindamycin  (CLEOCIN -T) 1 % external solution    Sig: Apply topically 2 (two) times daily.     Dispense:  30 mL    Refill:  6    Supervising Provider:   JAYNE VONN H [[id number]]     2. S/P vaginal hysterectomy   3. Decreased sex drive   4. Urethral caruncle    5. Hidradenitis suppurativa She requests refill on clindamycin  sol. - clindamycin  (CLEOCIN -T) 1 % external solution; Apply topically 2 (two) times daily.  Dispense: 30 mL; Refill: 6     Plan:     Follow up in 6 months or sooner if needed

## 2024-02-11 ENCOUNTER — Encounter: Payer: Self-pay | Admitting: Radiology

## 2024-02-17 ENCOUNTER — Other Ambulatory Visit: Payer: Self-pay | Admitting: "Endocrinology

## 2024-02-17 DIAGNOSIS — E349 Endocrine disorder, unspecified: Secondary | ICD-10-CM

## 2024-02-17 DIAGNOSIS — E038 Other specified hypothyroidism: Secondary | ICD-10-CM

## 2024-02-23 ENCOUNTER — Telehealth: Payer: Self-pay | Admitting: Orthopaedic Surgery

## 2024-02-23 ENCOUNTER — Telehealth: Payer: Self-pay | Admitting: "Endocrinology

## 2024-02-23 NOTE — Telephone Encounter (Signed)
 Dr. Janae pt - spoke w/the pt, she is requesting a refill Hydrocodone  7.5-325 to be sent to [facility]

## 2024-02-23 NOTE — Telephone Encounter (Signed)
 Patient called to move her appt due to she has not had her labs. She said she also has not been called to schedule her ACTH. Please Advise

## 2024-02-24 ENCOUNTER — Other Ambulatory Visit: Payer: Self-pay | Admitting: "Endocrinology

## 2024-02-24 ENCOUNTER — Ambulatory Visit: Admitting: "Endocrinology

## 2024-02-24 MED ORDER — HYDROCODONE-ACETAMINOPHEN 7.5-325 MG PO TABS: ORAL_TABLET | ORAL | 0 refills | Status: DC

## 2024-02-24 NOTE — Telephone Encounter (Signed)
 Noted

## 2024-02-24 NOTE — Telephone Encounter (Signed)
 Refaxed orders for ACTH stimulation, 3 times points test to [facility].

## 2024-02-25 ENCOUNTER — Telehealth: Payer: Self-pay

## 2024-02-25 NOTE — Telephone Encounter (Signed)
 Auth Submission: NO AUTH NEEDED Site of care: Site of care: [facility] Payer: [place] MEDICAID [employer]  Medication & CPT/J Code(s) submitted: COSYNTOPIN [id number] Diagnosis Code:  Route of submission (phone, fax, portal): Portal Phone # Fax # Auth type: Buy/Bill HB Units/visits requested: 1 visit Reference number:  Approval from: 02/25/24 to 06/21/24

## 2024-03-13 ENCOUNTER — Ambulatory Visit: Admitting: "Endocrinology

## 2024-03-15 LAB — LIPID PANEL
Chol/HDL Ratio: 3.3 ratio (ref 0.0–4.4)
Cholesterol, Total: 238 mg/dL — ABNORMAL HIGH (ref 100–199)
HDL: 73 mg/dL (ref >39)
LDL Chol Calc (NIH): 144 mg/dL — ABNORMAL HIGH (ref 0–99)
Triglycerides: 119 mg/dL (ref 0–149)
VLDL Cholesterol Cal: 21 mg/dL (ref 5–40)

## 2024-03-15 LAB — COMPREHENSIVE METABOLIC PANEL WITH GFR
ALT: 101 IU/L — ABNORMAL HIGH (ref 0–32)
AST: 54 IU/L — ABNORMAL HIGH (ref 0–40)
Albumin: 4.8 g/dL (ref 3.9–4.9)
Alkaline Phosphatase: 77 IU/L (ref 41–116)
BUN/Creatinine Ratio: 10 (ref 9–23)
BUN: 10 mg/dL (ref 6–24)
Bilirubin Total: 0.3 mg/dL (ref 0.0–1.2)
CO2: 21 mmol/L (ref 20–29)
Calcium: 9.8 mg/dL (ref 8.7–10.2)
Chloride: 100 mmol/L (ref 96–106)
Creatinine, Ser: 0.97 mg/dL (ref 0.57–1.00)
Globulin, Total: 2.1 g/dL (ref 1.5–4.5)
Glucose: 85 mg/dL (ref 70–99)
Potassium: 4.2 mmol/L (ref 3.5–5.2)
Sodium: 136 mmol/L (ref 134–144)
Total Protein: 6.9 g/dL (ref 6.0–8.5)
eGFR: 72 mL/min/1.73 (ref >59)

## 2024-03-15 LAB — T4, FREE: Free T4: 1.01 ng/dL (ref 0.82–1.77)

## 2024-03-15 LAB — TSH: TSH: 26 u[IU]/mL — ABNORMAL HIGH (ref 0.450–4.500)

## 2024-03-21 ENCOUNTER — Telehealth: Payer: Self-pay | Admitting: Orthopaedic Surgery

## 2024-03-21 MED ORDER — HYDROCODONE-ACETAMINOPHEN 7.5-325 MG PO TABS: ORAL_TABLET | ORAL | 0 refills | Status: AC

## 2024-03-21 NOTE — Telephone Encounter (Signed)
 Dr. Janae pt - spoke w/the pt, she is requesting a refill for Hydrocodone  7.5-325 to be sent to [facility]

## 2024-03-22 ENCOUNTER — Ambulatory Visit: Admitting: "Endocrinology

## 2024-04-13 ENCOUNTER — Ambulatory Visit: Admitting: Orthopedic Surgery

## 2024-04-24 ENCOUNTER — Encounter: Payer: Self-pay | Admitting: Radiology

## 2024-04-26 ENCOUNTER — Ambulatory Visit: Admitting: Orthopedic Surgery

## 2024-04-26 ENCOUNTER — Ambulatory Visit: Admitting: Orthopaedic Surgery

## 2024-05-02 ENCOUNTER — Ambulatory Visit (INDEPENDENT_AMBULATORY_CARE_PROVIDER_SITE_OTHER): Admitting: "Endocrinology

## 2024-05-02 ENCOUNTER — Encounter: Payer: Self-pay | Admitting: "Endocrinology

## 2024-05-02 VITALS — BP 100/74 | HR 68 | Ht 63.0 in | Wt 117.4 lb

## 2024-05-02 DIAGNOSIS — E782 Mixed hyperlipidemia: Secondary | ICD-10-CM | POA: Diagnosis not present

## 2024-05-02 DIAGNOSIS — E349 Endocrine disorder, unspecified: Secondary | ICD-10-CM | POA: Diagnosis not present

## 2024-05-02 DIAGNOSIS — E038 Other specified hypothyroidism: Secondary | ICD-10-CM | POA: Diagnosis not present

## 2024-05-02 MED ORDER — ROSUVASTATIN CALCIUM 10 MG PO TABS: 10.0000 mg | ORAL_TABLET | Freq: Every day | ORAL | 1 refills | Status: AC

## 2024-05-02 MED ORDER — LEVOTHYROXINE SODIUM 100 MCG PO TABS: 100.0000 ug | ORAL_TABLET | Freq: Every day | ORAL | 1 refills | Status: AC

## 2024-05-02 NOTE — Progress Notes (Signed)
 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/23/2018   Migraine    no longer has migraines   NAUSEA WITH VOMITING 09/23/2009   Panic disorder 07/26/2018   Pneumonia    PTSD (post-traumatic stress disorder) 07/26/2018   PUD, HX OF 09/23/2009   Qualifier: Diagnosis of   By: Ezzard DEVONNA Sonny CANDIE        Past Surgical History:  Procedure Laterality Date   ANKLE SURGERY     BREAST ENHANCEMENT SURGERY     CESAREAN SECTION     KNEE SURGERY Right    arthroscopy   LAPAROSCOPIC BILATERAL SALPINGECTOMY  07/27/2012   Procedure: LAPAROSCOPIC BILATERAL SALPINGECTOMY;  Surgeon: Vonn VEAR Inch, MD;  Location: [facility];  Service: Gynecology;  Laterality: N/A;   OOPHORECTOMY Right 09/09/2016   Procedure: RIGHT OOPHORECTOMY;  Surgeon: Vonn VEAR Inch, MD;  Location: [facility];  Service: Gynecology;  Laterality: Right;   OVARIAN CYST REMOVAL     TOOTH EXTRACTION N/A 01/30/2022   Procedure: DENTAL RESTORATION/EXTRACTIONS;  Surgeon: Sheryle Hamilton, DMD;  Location: [facility];  Service: Oral Surgery;  Laterality: N/A;   VAGINAL HYSTERECTOMY N/A 09/09/2016   Procedure: HYSTERECTOMY VAGINAL;  Surgeon: Vonn VEAR Inch, MD;  Location: [facility];  Service: Gynecology;  Laterality: N/A;   Social History   Socioeconomic History   Marital status: Legally Separated    Spouse name: Not on file   Number of children: 2   Years of education: Not on file   Highest education level: Not on file  Occupational History   Not on file  Tobacco Use   Smoking status: Every Day    Current packs/day: 0.50     Average packs/day: 0.5 packs/day for 15.0 years (7.5 ttl pk-yrs)    Types: Cigarettes   Smokeless tobacco: Never  Vaping Use   Vaping status: Never Used  Substance and Sexual Activity   Alcohol use: Yes    Comment: rare   Drug use: No   Sexual activity: Not Currently    Birth control/protection: Surgical    Comment: hyst  Other Topics Concern   Not on file  Social History Narrative   Not on file   Social Drivers of Health   Financial Resource Strain: Not on file  Food Insecurity: Not on file  Transportation Needs: Not on file  Physical Activity: Not on file  Stress: Not on file  Social Connections: Unknown (12/30/2021)   [facility] from [facility]   Social Network    Social Network: Not on file   Outpatient Encounter Medications as of 05/02/2024  Medication Sig   hydrOXYzine (ATARAX) 50 MG tablet Take 50-100 mg by mouth at bedtime.   lamoTRIgine (LAMICTAL) 25 MG tablet Take 25 mg by mouth daily. 25mg  daily x 2 weeks then increase to 50mg  daily x 2 weeks then 100mg  daily.   alprazolam  (XANAX ) 2 MG tablet Take 2 mg by mouth in the morning, at noon,  in the evening, and at bedtime.   clindamycin  (CLEOCIN -T) 1 % external solution Apply topically 2 (two) times daily.   HYDROcodone -acetaminophen  (NORCO) 7.5-325 MG tablet TAKE 1 TABLET BY MOUTH EVERY 6 HOURS AS NEEDED FOR PAIN. MUST LAST 28 DAYS. (Patient not taking: Reported on 05/02/2024)   ibuprofen  (ADVIL ,MOTRIN ) 200 MG tablet Take 400-600 mg by mouth every 8 (eight) hours as needed (for pain/headaches.).   levothyroxine  (SYNTHROID ) 100 MCG tablet Take 1 tablet (100 mcg total) by mouth daily before breakfast.   Prasterone  (INTRAROSA ) 6.5 MG INST Place 1 applicator vaginally daily. At bedtime   promethazine  (PHENERGAN ) 25 MG tablet Take 25 mg by mouth 4 (four) times daily as needed for nausea or vomiting. (Patient not taking: Reported on 05/02/2024)   rosuvastatin (CRESTOR) 10 MG tablet Take 1 tablet (10 mg total) by mouth at  bedtime.   tiZANidine  (ZANAFLEX ) 4 MG tablet Take 4 mg by mouth every 6 (six) hours as needed for muscle spasms.   topiramate  (TOPAMAX ) 200 MG tablet Take 200 mg by mouth daily.   venlafaxine (EFFEXOR) 37.5 MG tablet Take 37.5 mg by mouth 2 (two) times daily.   [DISCONTINUED] dicyclomine  (BENTYL ) 20 MG tablet Take 1 tablet (20 mg total) by mouth 3 (three) times daily as needed for up to 30 days for spasms.   [DISCONTINUED] levothyroxine  (SYNTHROID ) 88 MCG tablet Take 1 tablet (88 mcg total) by mouth daily before breakfast.   [DISCONTINUED] rosuvastatin (CRESTOR) 10 MG tablet Take 10 mg by mouth daily. (Patient not taking: Reported on 05/02/2024)   No facility-administered encounter medications on file as of 05/02/2024.   ALLERGIES: Allergies  Allergen Reactions   Doxycycline Nausea And Vomiting   Sulfonamide Derivatives Nausea And Vomiting   VACCINATION STATUS:  There is no immunization history on file for this patient.  She is here to follow-up for hypothyroidism, hyperlipidemia, and previously documented hypocortisolemia.  She is returning with repeat thyroid  function test consistent with slight under replacement.  She is currently on levothyroxine  88 mcg p.o. daily before breakfast.    She reports good consistency with her medications.  She remains under unfavorable social stress.  She She presents with 3 pounds of weight loss. She still does not eat enough due to poor dentition. She has hypothyroidism diagnosed at approximate age of 29 years.  She has hyperlipidemia, on Crestor 10 mg p.o. nightly.  She did not have a chance to complete the ACTH stimulation test.  Patient was previously documented to have mild hypercortisolemia.  She remains on opioids for diffuse pain control.  She is not on blood pressure medications.  Her other medications include alprazolam , topiramate , trazodone , tizanidine , Norco.   Review of systems   Objective:    BP 100/74   Pulse 68   Ht 5' 3 (1.6  m)   Wt 117 lb 6.4 oz (53.3 kg)   LMP 07/24/2016   BMI 20.80 kg/m   Wt Readings from Last 3 Encounters:  05/02/24 117 lb 6.4 oz (53.3 kg)  01/27/24 116 lb 8 oz (52.8 kg)  01/26/24 117 lb (53.1 kg)    BP Readings from Last 3 Encounters:  05/02/24 100/74  01/27/24 105/70  01/26/24 109/78    Physical Exam- Limited  Constitutional:  Body mass index is 20.8 kg/m. , not in acute distress, + dysphoric mood.     CMP     Component Value Date/Time   NA 136 03/14/2024 1054   K 4.2 03/14/2024 1054   CL 100 03/14/2024 1054  CO2 21 03/14/2024 1054   GLUCOSE 85 03/14/2024 1054   GLUCOSE 101 (H) 10/06/2023 2035   BUN 10 03/14/2024 1054   CREATININE 0.97 03/14/2024 1054   CREATININE 1.05 04/27/2017 1226   CALCIUM 9.8 03/14/2024 1054   PROT 6.9 03/14/2024 1054   ALBUMIN 4.8 03/14/2024 1054   AST 54 (H) 03/14/2024 1054   ALT 101 (H) 03/14/2024 1054   ALKPHOS 77 03/14/2024 1054   BILITOT 0.3 03/14/2024 1054   GFRNONAA >60 10/06/2023 2035   GFRAA >60 10/09/2019 1442   Recent Results (from the past 2160 hours)  Lipid panel     Status: Abnormal   Collection Time: 03/14/24 10:54 AM  Result Value Ref Range   Cholesterol, Total 238 (H) 100 - 199 mg/dL   Triglycerides 880 0 - 149 mg/dL   HDL 73 >60 mg/dL   VLDL Cholesterol Cal 21 5 - 40 mg/dL   LDL Chol Calc (NIH) 855 (H) 0 - 99 mg/dL   Chol/HDL Ratio 3.3 0.0 - 4.4 ratio    Comment:                                   T. Chol/HDL Ratio                                             Men  Women                               1/2 Avg.Risk  3.4    3.3                                   Avg.Risk  5.0    4.4                                2X Avg.Risk  9.6    7.1                                3X Avg.Risk 23.4   11.0   TSH     Status: Abnormal   Collection Time: 03/14/24 10:54 AM  Result Value Ref Range   TSH 26.000 (H) 0.450 - 4.500 uIU/mL  T4, free     Status: None   Collection Time: 03/14/24 10:54 AM  Result Value Ref Range   Free T4  1.01 0.82 - 1.77 ng/dL  Comprehensive metabolic panel     Status: Abnormal   Collection Time: 03/14/24 10:54 AM  Result Value Ref Range   Glucose 85 70 - 99 mg/dL   BUN 10 6 - 24 mg/dL   Creatinine, Ser 9.02 0.57 - 1.00 mg/dL   eGFR 72 >40 fO/fpw/8.26   BUN/Creatinine Ratio 10 9 - 23   Sodium 136 134 - 144 mmol/L   Potassium 4.2 3.5 - 5.2 mmol/L   Chloride 100 96 - 106 mmol/L   CO2 21 20 - 29 mmol/L   Calcium 9.8 8.7 - 10.2 mg/dL   Total Protein 6.9 6.0 - 8.5 g/dL   Albumin 4.8 3.9 - 4.9 g/dL  Globulin, Total 2.1 1.5 - 4.5 g/dL   Bilirubin Total 0.3 0.0 - 1.2 mg/dL   Alkaline Phosphatase 77 41 - 116 IU/L    Comment:               **Please note reference interval change**   AST 54 (H) 0 - 40 IU/L   ALT 101 (H) 0 - 32 IU/L    Assessment & Plan:   1. Hypothyroidism   2.  Abnormal cortisol level  3.  Hyperlipidemia  -Her previsit thyroid  function tests are consistent with under replacement.  I discussed and increased her levothyroxine  to 100 mcg p.o. daily before breakfast.     - We discussed about the correct intake of her thyroid  hormone, on empty stomach at fasting, with water , separated by at least 30 minutes from breakfast and other medications,  and separated by more than 4 hours from calcium, iron, multivitamins, acid reflux medications (PPIs). -Patient is made aware of the fact that thyroid  hormone replacement is needed for life, dose to be adjusted by periodic monitoring of thyroid  function tests.    Regarding her discordant cortisol level, not a diagnosis of adrenal insufficiency at this time.  Her previsit 24-hour urine free cortisol measurement revealed low normal cortisol level at 13. -She has a few risk for adrenal insufficiency including opioids.  She has decided not to obtain ACTH stimulation test after her last visit, approached again for this important test to assess her adrenal function before her next visit.    Regarding her dyslipidemia , she has responded to  low-dose Crestor.  She is advised to continue.  I have refilled her Crestor 10 mg p.o. nightly.   - I advised patient to maintain close follow up with Patient, No Pcp Per for primary care needs.  I spent  20  minutes in the care of the patient today including review of labs from Thyroid  Function, CMP, and other relevant labs ; imaging/biopsy records (current and previous including abstractions from other facilities); face-to-face time discussing  her lab results and symptoms, medications doses, her options of short and long term treatment based on the latest standards of care / guidelines;   and documenting the encounter.  Vanessa Bowen  participated in the discussions, expressed understanding, and voiced agreement with the above plans.  All questions were answered to her satisfaction. she is encouraged to contact clinic should she have any questions or concerns prior to her return visit.    Follow up plan: Return in about 3 months (around 08/02/2024) for Fasting Labs  in AM B4 8.  [place], [facility] [address] [place], [place] [address] Phone: [phone] Fax: [phone]  05/02/2024, 1:43 PM

## 2024-05-04 ENCOUNTER — Telehealth: Payer: Self-pay | Admitting: "Endocrinology

## 2024-05-04 NOTE — Telephone Encounter (Signed)
 Pt needs ACTH

## 2024-05-04 NOTE — Telephone Encounter (Signed)
 Order for ACTH faxed to [facility].

## 2024-05-05 ENCOUNTER — Other Ambulatory Visit (HOSPITAL_COMMUNITY): Payer: Self-pay | Admitting: "Endocrinology

## 2024-06-01 ENCOUNTER — Other Ambulatory Visit: Payer: Self-pay | Admitting: "Endocrinology

## 2024-06-22 ENCOUNTER — Encounter: Payer: Self-pay | Admitting: "Endocrinology

## 2024-06-29 ENCOUNTER — Ambulatory Visit: Admitting: Family Medicine

## 2024-07-13 ENCOUNTER — Ambulatory Visit: Admitting: Family Medicine

## 2024-08-03 ENCOUNTER — Ambulatory Visit: Admitting: "Endocrinology

## 2024-08-11 ENCOUNTER — Ambulatory Visit: Admitting: Family Medicine
